# Patient Record
Sex: Female | Born: 1970
Health system: Southern US, Community
[De-identification: ages and names within clinical notes are randomized; demographics above are authoritative.]

## PROBLEM LIST (undated history)

## (undated) DIAGNOSIS — F329 Major depressive disorder, single episode, unspecified: Secondary | ICD-10-CM

## (undated) DIAGNOSIS — G114 Hereditary spastic paraplegia: Secondary | ICD-10-CM

## (undated) DIAGNOSIS — G43909 Migraine, unspecified, not intractable, without status migrainosus: Secondary | ICD-10-CM

## (undated) DIAGNOSIS — F32A Depression, unspecified: Secondary | ICD-10-CM

## (undated) DIAGNOSIS — K275 Chronic or unspecified peptic ulcer, site unspecified, with perforation: Secondary | ICD-10-CM

## (undated) DIAGNOSIS — K219 Gastro-esophageal reflux disease without esophagitis: Secondary | ICD-10-CM

## (undated) DIAGNOSIS — F419 Anxiety disorder, unspecified: Secondary | ICD-10-CM

## (undated) DIAGNOSIS — G8929 Other chronic pain: Secondary | ICD-10-CM

## (undated) HISTORY — PX: HERNIA REPAIR: SHX51

## (undated) HISTORY — DX: Migraine, unspecified, not intractable, without status migrainosus: G43.909

## (undated) HISTORY — PX: CHOLECYSTECTOMY: SHX55

## (undated) HISTORY — DX: Gastro-esophageal reflux disease without esophagitis: K21.9

---

## 1998-08-14 HISTORY — PX: ENDOMETRIAL ABLATION: SHX621

## 2002-01-24 ENCOUNTER — Emergency Department (HOSPITAL_COMMUNITY): Admission: EM | Admit: 2002-01-24 | Discharge: 2002-01-24 | Payer: Self-pay | Admitting: Emergency Medicine

## 2004-12-09 ENCOUNTER — Emergency Department (HOSPITAL_COMMUNITY): Admission: EM | Admit: 2004-12-09 | Discharge: 2004-12-09 | Payer: Self-pay | Admitting: Emergency Medicine

## 2005-04-16 ENCOUNTER — Emergency Department (HOSPITAL_COMMUNITY): Admission: EM | Admit: 2005-04-16 | Discharge: 2005-04-16 | Payer: Self-pay | Admitting: Emergency Medicine

## 2005-05-08 ENCOUNTER — Ambulatory Visit: Payer: Self-pay | Admitting: Orthopedic Surgery

## 2006-03-13 ENCOUNTER — Ambulatory Visit: Payer: Self-pay | Admitting: Internal Medicine

## 2006-03-21 ENCOUNTER — Encounter (INDEPENDENT_AMBULATORY_CARE_PROVIDER_SITE_OTHER): Payer: Self-pay | Admitting: *Deleted

## 2006-03-21 ENCOUNTER — Ambulatory Visit (HOSPITAL_COMMUNITY): Admission: RE | Admit: 2006-03-21 | Discharge: 2006-03-21 | Payer: Self-pay | Admitting: Internal Medicine

## 2008-01-20 ENCOUNTER — Emergency Department (HOSPITAL_COMMUNITY): Admission: EM | Admit: 2008-01-20 | Discharge: 2008-01-20 | Payer: Self-pay | Admitting: Emergency Medicine

## 2008-03-06 DIAGNOSIS — K219 Gastro-esophageal reflux disease without esophagitis: Secondary | ICD-10-CM | POA: Insufficient documentation

## 2008-03-06 DIAGNOSIS — F411 Generalized anxiety disorder: Secondary | ICD-10-CM | POA: Insufficient documentation

## 2008-03-10 ENCOUNTER — Ambulatory Visit: Payer: Self-pay | Admitting: Internal Medicine

## 2008-03-10 DIAGNOSIS — R1031 Right lower quadrant pain: Secondary | ICD-10-CM | POA: Insufficient documentation

## 2008-03-20 ENCOUNTER — Telehealth: Payer: Self-pay | Admitting: Internal Medicine

## 2008-03-24 ENCOUNTER — Telehealth: Payer: Self-pay | Admitting: Internal Medicine

## 2008-05-22 ENCOUNTER — Emergency Department (HOSPITAL_COMMUNITY): Admission: EM | Admit: 2008-05-22 | Discharge: 2008-05-22 | Payer: Self-pay | Admitting: Emergency Medicine

## 2008-08-10 ENCOUNTER — Encounter
Admission: RE | Admit: 2008-08-10 | Discharge: 2008-08-13 | Payer: Self-pay | Admitting: Physical Medicine & Rehabilitation

## 2008-08-11 ENCOUNTER — Ambulatory Visit: Payer: Self-pay | Admitting: Physical Medicine & Rehabilitation

## 2008-08-19 ENCOUNTER — Encounter
Admission: RE | Admit: 2008-08-19 | Discharge: 2008-11-17 | Payer: Self-pay | Admitting: Physical Medicine & Rehabilitation

## 2008-09-10 ENCOUNTER — Encounter
Admission: RE | Admit: 2008-09-10 | Discharge: 2008-12-09 | Payer: Self-pay | Admitting: Physical Medicine & Rehabilitation

## 2008-09-10 ENCOUNTER — Ambulatory Visit: Payer: Self-pay | Admitting: Physical Medicine & Rehabilitation

## 2008-12-10 ENCOUNTER — Encounter
Admission: RE | Admit: 2008-12-10 | Discharge: 2008-12-15 | Payer: Self-pay | Admitting: Physical Medicine & Rehabilitation

## 2008-12-15 ENCOUNTER — Ambulatory Visit: Payer: Self-pay | Admitting: Physical Medicine & Rehabilitation

## 2009-05-17 ENCOUNTER — Encounter
Admission: RE | Admit: 2009-05-17 | Discharge: 2009-05-18 | Payer: Self-pay | Admitting: Physical Medicine & Rehabilitation

## 2009-05-18 ENCOUNTER — Ambulatory Visit: Payer: Self-pay | Admitting: Physical Medicine & Rehabilitation

## 2009-09-16 ENCOUNTER — Encounter
Admission: RE | Admit: 2009-09-16 | Discharge: 2009-09-16 | Payer: Self-pay | Admitting: Physical Medicine & Rehabilitation

## 2009-10-15 ENCOUNTER — Encounter (INDEPENDENT_AMBULATORY_CARE_PROVIDER_SITE_OTHER): Payer: Self-pay | Admitting: *Deleted

## 2009-10-15 ENCOUNTER — Emergency Department (HOSPITAL_COMMUNITY): Admission: EM | Admit: 2009-10-15 | Discharge: 2009-10-15 | Payer: Self-pay | Admitting: Emergency Medicine

## 2009-11-03 ENCOUNTER — Encounter (INDEPENDENT_AMBULATORY_CARE_PROVIDER_SITE_OTHER): Payer: Self-pay | Admitting: *Deleted

## 2009-11-03 ENCOUNTER — Emergency Department (HOSPITAL_COMMUNITY): Admission: EM | Admit: 2009-11-03 | Discharge: 2009-11-04 | Payer: Self-pay | Admitting: Emergency Medicine

## 2009-11-04 ENCOUNTER — Telehealth: Payer: Self-pay | Admitting: Internal Medicine

## 2009-11-11 ENCOUNTER — Ambulatory Visit: Payer: Self-pay | Admitting: Internal Medicine

## 2009-11-11 DIAGNOSIS — F17200 Nicotine dependence, unspecified, uncomplicated: Secondary | ICD-10-CM | POA: Insufficient documentation

## 2009-11-11 DIAGNOSIS — R197 Diarrhea, unspecified: Secondary | ICD-10-CM | POA: Insufficient documentation

## 2009-11-11 DIAGNOSIS — R933 Abnormal findings on diagnostic imaging of other parts of digestive tract: Secondary | ICD-10-CM | POA: Insufficient documentation

## 2009-11-11 DIAGNOSIS — R109 Unspecified abdominal pain: Secondary | ICD-10-CM | POA: Insufficient documentation

## 2009-12-08 ENCOUNTER — Ambulatory Visit: Payer: Self-pay | Admitting: Internal Medicine

## 2010-01-31 ENCOUNTER — Encounter
Admission: RE | Admit: 2010-01-31 | Discharge: 2010-05-01 | Payer: Self-pay | Admitting: Physical Medicine & Rehabilitation

## 2010-02-04 ENCOUNTER — Ambulatory Visit: Payer: Self-pay | Admitting: Physical Medicine & Rehabilitation

## 2010-03-12 ENCOUNTER — Emergency Department (HOSPITAL_COMMUNITY): Admission: EM | Admit: 2010-03-12 | Discharge: 2010-03-13 | Payer: Self-pay | Admitting: Emergency Medicine

## 2010-03-18 ENCOUNTER — Telehealth: Payer: Self-pay | Admitting: Internal Medicine

## 2010-04-01 ENCOUNTER — Ambulatory Visit: Payer: Self-pay | Admitting: Physical Medicine & Rehabilitation

## 2010-05-17 ENCOUNTER — Ambulatory Visit: Payer: Self-pay | Admitting: Internal Medicine

## 2010-07-27 ENCOUNTER — Encounter
Admission: RE | Admit: 2010-07-27 | Discharge: 2010-08-02 | Payer: Self-pay | Source: Home / Self Care | Attending: Physical Medicine & Rehabilitation | Admitting: Physical Medicine & Rehabilitation

## 2010-08-02 ENCOUNTER — Ambulatory Visit: Payer: Self-pay | Admitting: Physical Medicine & Rehabilitation

## 2010-08-31 ENCOUNTER — Encounter
Admission: RE | Admit: 2010-08-31 | Discharge: 2010-09-13 | Payer: Self-pay | Source: Home / Self Care | Attending: Physical Medicine & Rehabilitation | Admitting: Physical Medicine & Rehabilitation

## 2010-09-04 ENCOUNTER — Encounter: Payer: Self-pay | Admitting: Internal Medicine

## 2010-09-06 ENCOUNTER — Ambulatory Visit: Admit: 2010-09-06 | Payer: Self-pay | Admitting: Physical Medicine & Rehabilitation

## 2010-09-13 NOTE — Assessment & Plan Note (Signed)
Summary: f/uabd. pain seen in erfor abd. pain   History of Present Illness Visit Type: Follow-up Visit Primary GI MD: Yancey Flemings MD Primary Provider: Claire Shown Requesting Provider: Riki Sheer Chief Complaint: follow up for abdominal pain , pt still c/o abdominal pain, also rectal bleeding History of Present Illness:   Patient was seen in 2007 for GERD and dysphagia. She was seen again 2009 for RLQ / flank pain which was felt to be musculoskeletal or urologic in nature.  She is here for post-ER follow up. Two weeks ago went to [facility] ER for nausea, vomiting, diarrhea (containing small amount of blood) and lower abdominal pain. The nausea and vomiting had actually been going on for two weeks prior to ER visit.  CBC, LFTs, lipase were unremarkable. CTscan showed mild colitis. Given Augmentin twice daily for 10 days, told had stomach virus. Back to ER on 11/04/09 with persistent abdominal pain, documented as epigastric in ER. Told by ER to follow up here. BMs are normalizing but she complains of diffuse abdominal pain extending all the way out to both flanks. Pain most pronounced in periumbilical area, it is worse after meals. Describes this pain as chronic but much worse lately.  She has heartburn and epigastric pain. Omeprazole isn't working well. No dysphagia. Nexium worked better.   No major weight loss, down 5 pounds since July 2009.    GI Review of Systems    Reports abdominal pain and  acid reflux.     Location of  Abdominal pain: diffuse.    Denies belching, bloating, chest pain, dysphagia with liquids, dysphagia with solids, heartburn, loss of appetite, nausea, vomiting, vomiting blood, weight loss, and  weight gain.        Denies anal fissure, black tarry stools, change in bowel habit, constipation, diarrhea, diverticulosis, fecal incontinence, heme positive stool, hemorrhoids, irritable bowel syndrome, jaundice, light color stool, liver problems, rectal bleeding, and  rectal  pain.    Current Medications (verified): 1)  Tizanidine Hcl 4 Mg  Tabs (Tizanidine Hcl) .... One By Mouth Every 6 Hours As Needed For Severe Spasms 2)  Omeprazole 40 Mg  Cpdr (Omeprazole) .... One Capsule By Mouth Daily 3)  Alprazolam 0.5 Mg  Tabs (Alprazolam) .... As Needed  Allergies (verified): 1)  ! Morphine  Past History:  Past Surgical History: Last updated: 03/10/2008 cholecystectomy tubal ligation 1 c-section  Family History: Last updated: 03/10/2008 No FH of Colon Cancer: father had ?cancer  Social History: Last updated: 03/10/2008 Patient currently smokes. 1 PPD Alcohol Use - yes  holidays Daily Caffeine Use  2 20oz sodas  Illicit Drug Use - no Patient does not get regular exercise.   Past Medical History: GERD (ICD-530.81) ANXIETY (ICD-300.00) Neurologic disorder involving the lower extremities  Review of Systems       The patient complains of arthritis/joint pain, back pain, and sleeping problems.  The patient denies allergy/sinus, anemia, anxiety-new, blood in urine, breast changes/lumps, change in vision, confusion, cough, coughing up blood, depression-new, fainting, fatigue, fever, headaches-new, hearing problems, heart murmur, heart rhythm changes, itching, menstrual pain, muscle pains/cramps, night sweats, nosebleeds, pregnancy symptoms, shortness of breath, skin rash, sore throat, swelling of feet/legs, swollen lymph glands, thirst - excessive , urination - excessive , urination changes/pain, urine leakage, vision changes, and voice change.    Vital Signs:  Patient profile:   40 year old female Height:      61 inches Weight:      136 pounds BMI:  25.79 BSA:     1.60 Pulse rate:   82 / minute Pulse rhythm:   regular BP sitting:   110 / 80  (left arm)  Vitals Entered By: Merri Ray CMA Duncan Dull) (November 11, 2009 8:25 AM)  Physical Exam  General:  Well developed, white female in wheelchair.  Eyes:  Conjunctiva pink, no icterus.  Mouth:   Poor dentition Neck:  no obvious masses  Lungs:  Clear throughout to auscultation. Heart:  Regular rate and rhythm; no murmurs, rubs,  or bruits. Abdomen:  Abdomen soft, nontender, nondistended. No obvious masses or hepatomegaly.Normal bowel sounds.  Msk:  Symmetrical with no gross deformities. Normal posture. Neurologic:  Alert and  oriented x4;  grossly normal neurologically. Skin:  Intact without significant lesions or rashes. Cervical Nodes:  No significant cervical adenopathy. Psych:  Alert and cooperative. Unable to weigh, pt states she is 89  Impression & Recommendations:  Problem # 1:  DIARRHEA OF PRESUMED INFECTIOUS ORIGIN (ICD-009.3) Assessment Improved Recent nausea, vomiting, diarrhea (containing small amount blood) and lower abdominal pain. CTscan revealed mild colitis (see report below).. ER labs revealed MCV 102.8, K+2.8, CMET and CBC otherwise normal. Lipase 15. Stool studies not done. Given acuteness, likely infectious etiology and now resolved after antibiotics  Problem # 2:  GERD (ICD-530.81) Assessment: Comment Only Symptomatic on Omeprazole 50mg  daily. Used to take Nexium which worked much better but she had difficulty paying for it. Several samples of Nexium given. Stop Omeprazole. Thinks anxiety exacerbating GERD and chronic abdominal pain. .Follow up in a few weeks with Dr. [name].   Problem # 3:  ANXIETY (ICD-300.00) Assessment: Deteriorated On Xanax. She asks for something stronger. Told her that anxiety could be exacerbating some of her GI issues but that we don't routinely treat anxiety. She was given number to behavioral health.   Problem # 4:  PARAPLEGIA (ICD-344.1) Assessment: Comment Only Wheelchair bound. Patient has a neurological disorder which causes spasticity of her lower body.   Problem # 5:  TOBACCO ABUSE (ICD-305.1) Assessment: Comment Only  Patient Instructions: 1)  We have given you samples of Nexium. Take 1 capsule twice daily. 2)  Take  1 30 min prior to breakfast and dinner. 3)  Made you an appointment with Dr [name] on 11-30-09 at 10:15Am. 4)  Call Behavioral Health at [phone] and make an appointment to be seen. 5)  Copy sent to : [name], MD 6)                         Rudi Heap, MD 7)  The medication list was reviewed and reconciled.  All changed / newly prescribed medications were explained.  A complete medication list was provided to the patient / caregiver.   CT Abdomen/Pelvis  Procedure date:  10/15/2009  Findings:      Clinical Data: Abdominal and pelvic pain with nausea.    CT ABDOMEN AND PELVIS WITH CONTRAST    Technique:  Multidetector CT imaging of the abdomen and pelvis was   performed following the standard protocol during bolus   administration of intravenous contrast.    Contrast: 100 ml intravenous Omnipaque-300    Comparison: 01/20/2008    Findings: Mild dependent and bibasilar atelectasis is identified.    The liver, adrenal glands, kidneys, spleen, and pancreas are   unremarkable.   Cholecystectomy noted.    Mild wall thickening of the ascending, transverse, and descending   colon are identified compatible with a mild colitis.  There is no evidence of pneumoperitoneum, focal abscess, or bowel   obstruction.   A small amount of free pelvic fluid is identified.   The bladder is within normal limits.    Small hypodense lesions within the uterus likely represent   fibroids.    No acute or suspicious bony abnormalities are identified.    IMPRESSION:   Mild colitis - likely inflammatory or infectious.    Small amount of free pelvic fluid - question related to colitis or   physiologic.    Read By:  Rosendo Gros,  M.D.   Released By:  Laruth Bouchard.D.

## 2010-09-13 NOTE — Progress Notes (Signed)
Summary: triage  Phone Note Call from Patient Call back at Home Phone [phone]   Caller: Patient Call For: [name] Reason for Call: Talk to Nurse Summary of Call: Patient states that she has severe stomach pain and went to the ER and was told to see Dr [name] asap but his firs available is not until the end of April. Initial call taken by: Tawni Levy,  November 04, 2009 1:09 PM  Follow-up for Phone Call        Given appt. for Monday with NP as Dr.Azelie Noguera is supervising dr. and she needss time to arrange transportation.  Follow-up by: Teryl Lucy RN,  November 04, 2009 2:00 PM

## 2010-09-13 NOTE — Progress Notes (Signed)
Summary: Does she need appt?  Phone Note Call from Patient Call back at Home Phone [phone]   Call For: Dr [name] Reason for Call: Talk to Nurse Summary of Call: Was released from hospital recently and is unsure if she needs to schedule a follow up appt.  Initial call taken by: Leanor Kail [facility],  March 18, 2010 2:18 PM  Follow-up for Phone Call        Jeani Hawking discharged orders states pt needs appt with GI for f/u.  Appt made for 03/29/10 with Dr [name]  Follow-up by: Chales Abrahams CMA Duncan Dull),  March 18, 2010 2:53 PM

## 2010-09-13 NOTE — Assessment & Plan Note (Signed)
Summary: Followup abdominal pain (saw Gunnar Fusi 3-31)   History of Present Illness Visit Type: Follow-up Visit Primary GI MD: Yancey Flemings MD Primary Provider: Gennette Pac at Dr. Kathi Der office  Requesting Provider: n/a Chief Complaint: F/u for abd pain. Pt states abd pain is getting better  History of Present Illness:   40 year old female with unspecified neurologic disorder involving the lower extremities for which she uses a walker and wheelchair, history of anxiety disorder with panic disorder, status post cholecystectomy, GERD, and history of non-GI right flank pain. She was evaluated in this office 4 weeks ago after an emergency room visit for nausea, vomiting, and diarrhea. See that dictation for details. She was felt to have an acute gastroenteritis. Mild colitis on CT. As well worsening GERD. She was treated symptomatically and her PPI increase her she presents today for followup as requested. She reports resolution of her acute abdominal pain and diarrhea. She now has 2 formed bowel movements daily. No bleeding. Reflux symptoms have improved on Nexium. She cannot afford the medication and requested we contacted her pharmacy regarding a medication override. No new complaints. She continues to smoke.   GI Review of Systems    Reports abdominal pain.     Location of  Abdominal pain: generalized.    Denies acid reflux, belching, bloating, chest pain, dysphagia with liquids, dysphagia with solids, heartburn, loss of appetite, nausea, vomiting, vomiting blood, weight loss, and  weight gain.        Denies anal fissure, black tarry stools, change in bowel habit, constipation, diarrhea, diverticulosis, fecal incontinence, heme positive stool, hemorrhoids, irritable bowel syndrome, jaundice, light color stool, liver problems, rectal bleeding, and  rectal pain.    Current Medications (verified): 1)  Tizanidine Hcl 4 Mg  Tabs (Tizanidine Hcl) .... One By Mouth Every 6 Hours As Needed For Severe  Spasms 2)  Omeprazole 40 Mg  Cpdr (Omeprazole) .... One Capsule By Mouth Daily 3)  Alprazolam 0.5 Mg  Tabs (Alprazolam) .... As Needed 4)  Nexium 40 Mg Cpdr (Esomeprazole Magnesium) .... Take 1 Capsule 30 Min Prior To Breakfast and Dinner 5)  Baclofen 10 Mg Tabs (Baclofen) .... One or Two Tablets By Mouth Daily 6)  Zestril(Dosage Unknown) .... As Needed For Headaches  Allergies (verified): 1)  ! Morphine  Past History:  Past Medical History: Reviewed history from 11/11/2009 and no changes required. GERD (ICD-530.81) ANXIETY (ICD-300.00) Neurologic disorder involving the lower extremities  Past Surgical History: Reviewed history from 03/10/2008 and no changes required. cholecystectomy tubal ligation 1 c-section  Family History: Reviewed history from 03/10/2008 and no changes required. No FH of Colon Cancer: father had ?cancer  Social History: Reviewed history from 03/10/2008 and no changes required. Patient currently smokes. 1 PPD Alcohol Use - yes  holidays Daily Caffeine Use  2 20oz sodas  Illicit Drug Use - no Patient does not get regular exercise.   Review of Systems  The patient denies allergy/sinus, anemia, anxiety-new, arthritis/joint pain, back pain, blood in urine, breast changes/lumps, change in vision, confusion, cough, coughing up blood, depression-new, fainting, fatigue, fever, headaches-new, hearing problems, heart murmur, heart rhythm changes, itching, menstrual pain, muscle pains/cramps, night sweats, nosebleeds, pregnancy symptoms, shortness of breath, skin rash, sleeping problems, sore throat, swelling of feet/legs, swollen lymph glands, thirst - excessive , urination - excessive , urination changes/pain, urine leakage, vision changes, and voice change.    Vital Signs:  Patient profile:   40 year old female Pulse rate:   88 / minute Pulse rhythm:  regular BP sitting:   110 / 64  (left arm) Cuff size:   regular  Vitals Entered By: Ok Anis CMA  (December 08, 2009 10:46 AM)  Physical Exam  General:  Well developed, well nourished, no acute distress.. Sitting in wheelchair Head:  Normocephalic and atraumatic. Eyes:  PERRLA, no icterus. Mouth:  No deformity or lesions,  Lungs:  Clear throughout to auscultation. Heart:  Regular rate and rhythm; no murmurs, rubs,  or bruits. Abdomen:  Soft, nontender and nondistended. No masses, hepatosplenomegaly or hernias noted. Normal bowel sounds. Pulses:  Normal pulses noted. Extremities:  no edema Neurologic:  Alert and  oriented x4;   Skin:  Intact without significant lesions or rashes. Psych:  Alert and cooperative. Normal mood and affect.   Impression & Recommendations:  Problem # 1:  DIARRHEA OF PRESUMED INFECTIOUS ORIGIN (ICD-009.3) acute gastroenteritis. Resolved.  .Plan: #1. Return to the care of Dr. Kathi Der office  Problem # 2:  GERD (ICD-530.81) seemingly better with Nexium added to omeprazole  Plan: #1. Continue b.i.d. PPI. Add Nexium along with omeprazole and she feels this works better than b.i.d. omeprazole #2. Reflux precautions with attention to weight loss #3. GI followup p.r.n.  Patient Instructions: 1)  Nexium 40 mg #180 sent to pharmacy with 3 RFs.   2)  The medication list was reviewed and reconciled.  All changed / newly prescribed medications were explained.  A complete medication list was provided to the patient / caregiver. 3)  printed and given to patient. Milford Cage [facility]  December 08, 2009 11:30 AM 4)  copy: Dr. Rudi Heap Prescriptions: NEXIUM 40 MG CPDR (ESOMEPRAZOLE MAGNESIUM) Take 1 capsule 30 min prior to breakfast and dinner  #180 x 3   Entered by:   Milford Cage NCMA   Authorized by:   Hilarie Fredrickson MD   Signed by:   Milford Cage NCMA on 12/08/2009   Method used:   Electronically to        [facility]. [phone]* (retail)       [address], [place]  [address]       Ph: [phone]       Fax: [phone]   RxID:    [id number]

## 2010-09-13 NOTE — Assessment & Plan Note (Signed)
Summary: GERD with nausea and vomiting   History of Present Illness Primary GI MD: Yancey Flemings MD Primary Provider: Gennette Pac at Dr. Kathi Der office  Requesting Provider: n/a Chief Complaint: Increased intermittant generalized abd pain with N/V and was recently seen in the ER at [facility]. Pt states she does have a appetite and she eats regularly but has N/V afterwards. Pt states it has only increased in the last 3 months. History of Present Illness:   40 year old female with unspecified neurologic disorder involving the lower extremities for which she uses a walker and wheelchair. Also history of chronic anxiety with panic disorder, GERD, prior cholecystectomy, and 9 GI right flank pain. She was last seen in April of 2011 for followup of abdominal pain. She presents now with intermittent complaints of nausea and vomiting which she has had for years. She takes PPI therapy and varying dosages. She currently feels that she does best with Nexium 40 mg b.i.d. and omeprazole p.r.n. breakthrough. She continues to smoke. She accompanied by her daughter. Some transient problems with constipation which have resolved. No weight loss. No bleeding. No dysphagia. Prior esophagram revealed benign stricture of the distal esophagus. Otherwise negative. She was evaluated in the emergency room July 31 for nausea and vomiting. Essentially normal CBC, copper has a metabolic panel, and urinalysis. She has been doing better recently.. She complains about the cost of her medications, as she is done previously, and request samples.   GI Review of Systems    Reports abdominal pain, acid reflux, belching, nausea, and  vomiting.      Denies bloating, chest pain, dysphagia with liquids, dysphagia with solids, heartburn, loss of appetite, vomiting blood, weight loss, and  weight gain.        Denies anal fissure, black tarry stools, change in bowel habit, constipation, diarrhea, diverticulosis, fecal incontinence,  heme positive stool, hemorrhoids, irritable bowel syndrome, jaundice, light color stool, liver problems, rectal bleeding, and  rectal pain.    Current Medications (verified): 1)  Tizanidine Hcl 4 Mg  Tabs (Tizanidine Hcl) .... One By Mouth Every 6 Hours As Needed For Severe Spasms 2)  Omeprazole 40 Mg  Cpdr (Omeprazole) .... One Capsule By Mouth Daily 3)  Alprazolam 2 Mg Tabs (Alprazolam) .... One Tablet By Mouth Every 12 Hours As Needed 4)  Nexium 40 Mg Cpdr (Esomeprazole Magnesium) .... Take 1 Capsule 30 Min Prior To Breakfast and Dinner 5)  Baclofen 10 Mg Tabs (Baclofen) .... One or Two Tablets By Mouth Daily 6)  Zestril(Dosage Unknown) .... As Needed For Headaches  Allergies (verified): 1)  ! Morphine  Past History:  Past Medical History: Reviewed history from 11/11/2009 and no changes required. GERD (ICD-530.81) ANXIETY (ICD-300.00) Neurologic disorder involving the lower extremities  Past Surgical History: Reviewed history from 03/10/2008 and no changes required. cholecystectomy tubal ligation 1 c-section  Family History: Reviewed history from 03/10/2008 and no changes required. No FH of Colon Cancer: father had ?cancer  Social History: Reviewed history from 03/10/2008 and no changes required. Patient currently smokes. 1 PPD Alcohol Use - yes  holidays Daily Caffeine Use  2 20oz sodas  Illicit Drug Use - no Patient does not get regular exercise.   Review of Systems  The patient denies allergy/sinus, anemia, anxiety-new, arthritis/joint pain, back pain, blood in urine, breast changes/lumps, change in vision, confusion, cough, coughing up blood, depression-new, fainting, fatigue, fever, headaches-new, hearing problems, heart murmur, heart rhythm changes, itching, menstrual pain, muscle pains/cramps, night sweats, nosebleeds, pregnancy symptoms, shortness  of breath, skin rash, sleeping problems, sore throat, swelling of feet/legs, swollen lymph glands, thirst - excessive  , urination - excessive , urination changes/pain, urine leakage, vision changes, and voice change.    Vital Signs:  Patient profile:   40 year old female Height:      61 inches Pulse rate:   90 / minute Pulse rhythm:   regular BP sitting:   116 / 88  (right arm) Cuff size:   regular  Vitals Entered By: Christie Nottingham CMA Duncan Dull) (May 17, 2010 3:34 PM)  Physical Exam  General:  well-nourished, well-developed, somewhat disheveled-appearing female in a wheelchair Head:  Normocephalic and atraumatic. Eyes:  anicteric Mouth:  No deformity or lesions,. No thrush Neck:  Supple; no masses or thyromegaly. Lungs:  Clear throughout to auscultation. Heart:  Regular rate and rhythm; no murmurs, rubs,  or bruits. Abdomen:  Soft, nontender and nondistended. No masses, hepatosplenomegaly or hernias noted. Normal bowel sounds. Msk:  no deformities Pulses:  Normal pulses noted. Extremities:  no edema Neurologic:  alert and oriented Skin:  Intact without significant lesions or rashes. Psych:  Alert and cooperative. Normal mood and affect.   Impression & Recommendations:  Problem # 1:  GERD (ICD-530.81) GERD. Probable cause for intermittent nausea with vomiting. Probably exacerbated by wheelchair-bound state.  Plan:  #1. Strict attention to reflux precautions with particular attention to the size of meal, timing of meal, and discontinuation of smoking #2. Continue Nexium 40 mg b.i.d. A prescription electronically submitted. Samples numbering 30 given #3. Followup p.r.n.  Patient Instructions: 1)  Nexium samples given to patient and Rx. sent to pharmacy #180 x 3  RFs. 2)  GI Reflux brochure given.  3)  GERD Prevention information given. 4)  Copy sent to : Gennette Pac at Dr. Kathi Der office  5)  The medication list was reviewed and reconciled.  All changed / newly prescribed medications were explained.  A complete medication list was provided to the patient /  caregiver.  Prescriptions: NEXIUM 40 MG CPDR (ESOMEPRAZOLE MAGNESIUM) Take 1 capsule 30 min prior to breakfast and dinner  #180 x 3   Entered by:   Milford Cage NCMA   Authorized by:   Hilarie Fredrickson MD   Signed by:   Milford Cage NCMA on 05/17/2010   Method used:   Electronically to        [facility]. [phone]* (retail)       [address] [place], [place]  [address]       Ph: [phone]       Fax: [phone]   RxID:   [id number]

## 2010-10-07 ENCOUNTER — Encounter: Payer: Medicare Other | Attending: Physical Medicine & Rehabilitation

## 2010-10-07 ENCOUNTER — Ambulatory Visit: Payer: Medicare Other | Admitting: Physical Medicine & Rehabilitation

## 2010-10-07 DIAGNOSIS — R209 Unspecified disturbances of skin sensation: Secondary | ICD-10-CM

## 2010-10-07 DIAGNOSIS — G8220 Paraplegia, unspecified: Secondary | ICD-10-CM | POA: Insufficient documentation

## 2010-10-07 DIAGNOSIS — Z993 Dependence on wheelchair: Secondary | ICD-10-CM | POA: Insufficient documentation

## 2010-10-29 LAB — COMPREHENSIVE METABOLIC PANEL WITH GFR
AST: 11 U/L (ref 0–37)
BUN: 15 mg/dL (ref 6–23)
Calcium: 8.2 mg/dL — ABNORMAL LOW (ref 8.4–10.5)
Potassium: 3.2 meq/L — ABNORMAL LOW (ref 3.5–5.1)
Sodium: 137 meq/L (ref 135–145)
Total Protein: 6.1 g/dL (ref 6.0–8.3)

## 2010-10-29 LAB — DIFFERENTIAL
Basophils Absolute: 0 K/uL (ref 0.0–0.1)
Basophils Relative: 0 % (ref 0–1)
Eosinophils Absolute: 0.1 10*3/uL (ref 0.0–0.7)
Eosinophils Relative: 1 % (ref 0–5)
Lymphocytes Relative: 39 % (ref 12–46)
Lymphs Abs: 2.5 10*3/uL (ref 0.7–4.0)
Monocytes Absolute: 0.3 K/uL (ref 0.1–1.0)
Monocytes Relative: 5 % (ref 3–12)
Neutro Abs: 3.6 K/uL (ref 1.7–7.7)
Neutrophils Relative %: 55 % (ref 43–77)

## 2010-10-29 LAB — CBC
HCT: 35.4 % — ABNORMAL LOW (ref 36.0–46.0)
Hemoglobin: 12.2 g/dL (ref 12.0–15.0)
MCH: 35.3 pg — ABNORMAL HIGH (ref 26.0–34.0)
MCHC: 34.5 g/dL (ref 30.0–36.0)
MCV: 102.5 fL — ABNORMAL HIGH (ref 78.0–100.0)
Platelets: 246 K/uL (ref 150–400)
RBC: 3.46 MIL/uL — ABNORMAL LOW (ref 3.87–5.11)
RDW: 12.8 % (ref 11.5–15.5)
WBC: 6.4 10*3/uL (ref 4.0–10.5)

## 2010-10-29 LAB — URINALYSIS, ROUTINE W REFLEX MICROSCOPIC
Bilirubin Urine: NEGATIVE
Glucose, UA: NEGATIVE mg/dL
Hgb urine dipstick: NEGATIVE
Ketones, ur: 15 mg/dL — AB
Nitrite: NEGATIVE
Protein, ur: NEGATIVE mg/dL
Specific Gravity, Urine: >1.03 — ABNORMAL HIGH (ref 1.005–1.030)
Urobilinogen, UA: 0.2 mg/dL (ref 0.0–1.0)
pH: 5.5 (ref 5.0–8.0)

## 2010-10-29 LAB — COMPREHENSIVE METABOLIC PANEL
ALT: 9 U/L (ref 0–35)
Albumin: 3.5 g/dL (ref 3.5–5.2)
Alkaline Phosphatase: 37 U/L — ABNORMAL LOW (ref 39–117)
CO2: 20 mEq/L (ref 19–32)
Chloride: 111 mEq/L (ref 96–112)
Creatinine, Ser: 0.52 mg/dL (ref 0.4–1.2)
GFR calc Af Amer: >60 mL/min (ref >60)
GFR calc non Af Amer: >60 mL/min (ref >60)
Glucose, Bld: 85 mg/dL (ref 70–99)
Total Bilirubin: 0.3 mg/dL (ref 0.3–1.2)

## 2010-10-29 LAB — LIPASE, BLOOD: Lipase: 22 U/L (ref 11–59)

## 2010-10-29 LAB — PREGNANCY, URINE: Preg Test, Ur: NEGATIVE

## 2010-11-06 LAB — URINALYSIS, ROUTINE W REFLEX MICROSCOPIC
Glucose, UA: NEGATIVE mg/dL
Glucose, UA: NEGATIVE mg/dL
Ketones, ur: 15 mg/dL — AB
Leukocytes, UA: NEGATIVE
Nitrite: NEGATIVE
Nitrite: NEGATIVE
Protein, ur: 30 mg/dL — AB
Protein, ur: NEGATIVE mg/dL
Specific Gravity, Urine: >1.03 — ABNORMAL HIGH (ref 1.005–1.030)
Specific Gravity, Urine: >1.03 — ABNORMAL HIGH (ref 1.005–1.030)
Urobilinogen, UA: 0.2 mg/dL (ref 0.0–1.0)
pH: 5.5 (ref 5.0–8.0)
pH: 5.5 (ref 5.0–8.0)

## 2010-11-06 LAB — COMPREHENSIVE METABOLIC PANEL
AST: 13 U/L (ref 0–37)
Albumin: 3.5 g/dL (ref 3.5–5.2)
Alkaline Phosphatase: 60 U/L (ref 39–117)
Calcium: 8.5 mg/dL (ref 8.4–10.5)
Creatinine, Ser: 0.55 mg/dL (ref 0.4–1.2)
GFR calc non Af Amer: >60 mL/min (ref >60)
Glucose, Bld: 89 mg/dL (ref 70–99)
Potassium: 2.9 mEq/L — ABNORMAL LOW (ref 3.5–5.1)
Sodium: 137 mEq/L (ref 135–145)
Total Protein: 6.8 g/dL (ref 6.0–8.3)

## 2010-11-06 LAB — COMPREHENSIVE METABOLIC PANEL WITH GFR
ALT: 10 U/L (ref 0–35)
BUN: 14 mg/dL (ref 6–23)
CO2: 23 meq/L (ref 19–32)
Chloride: 105 meq/L (ref 96–112)
GFR calc Af Amer: >60 mL/min (ref >60)
Total Bilirubin: 0.6 mg/dL (ref 0.3–1.2)

## 2010-11-06 LAB — CBC
Hemoglobin: 14.9 g/dL (ref 12.0–15.0)
Platelets: 211 10*3/uL (ref 150–400)
RBC: 4.22 MIL/uL (ref 3.87–5.11)
RDW: 12.1 % (ref 11.5–15.5)
WBC: 10.1 10*3/uL (ref 4.0–10.5)

## 2010-11-06 LAB — DIFFERENTIAL
Eosinophils Relative: 0 % (ref 0–5)
Lymphocytes Relative: 7 % — ABNORMAL LOW (ref 12–46)
Lymphs Abs: 0.7 10*3/uL (ref 0.7–4.0)
Monocytes Relative: 3 % (ref 3–12)
Neutro Abs: 9.2 10*3/uL — ABNORMAL HIGH (ref 1.7–7.7)
Neutrophils Relative %: 91 % — ABNORMAL HIGH (ref 43–77)

## 2010-11-06 LAB — URINE MICROSCOPIC-ADD ON

## 2010-11-06 LAB — LIPASE, BLOOD: Lipase: 15 U/L (ref 11–59)

## 2010-11-29 ENCOUNTER — Ambulatory Visit: Payer: Medicare Other | Admitting: Physical Medicine & Rehabilitation

## 2010-11-29 ENCOUNTER — Encounter: Payer: Medicare Other | Attending: Physical Medicine & Rehabilitation

## 2010-11-29 DIAGNOSIS — R209 Unspecified disturbances of skin sensation: Secondary | ICD-10-CM | POA: Insufficient documentation

## 2010-11-29 DIAGNOSIS — G8220 Paraplegia, unspecified: Secondary | ICD-10-CM | POA: Insufficient documentation

## 2010-11-29 DIAGNOSIS — Z993 Dependence on wheelchair: Secondary | ICD-10-CM | POA: Insufficient documentation

## 2010-12-19 ENCOUNTER — Ambulatory Visit: Payer: Medicare Other | Admitting: Physical Medicine & Rehabilitation

## 2010-12-19 ENCOUNTER — Encounter: Payer: Medicare Other | Attending: Physical Medicine & Rehabilitation

## 2010-12-19 DIAGNOSIS — R209 Unspecified disturbances of skin sensation: Secondary | ICD-10-CM | POA: Insufficient documentation

## 2010-12-19 DIAGNOSIS — G8220 Paraplegia, unspecified: Secondary | ICD-10-CM | POA: Insufficient documentation

## 2010-12-19 DIAGNOSIS — G114 Hereditary spastic paraplegia: Secondary | ICD-10-CM

## 2010-12-19 DIAGNOSIS — Z993 Dependence on wheelchair: Secondary | ICD-10-CM | POA: Insufficient documentation

## 2010-12-20 NOTE — Assessment & Plan Note (Signed)
REASON FOR VISIT:  Spasms in the leg and back.  A 40 year old female with history of spastic paraplegia autosomal dominant inheritance pattern.  Last seen by me August 02, 2010.  She is well controlled on her current medication regimen.  Her main complaint is has to do with her migraines and the plans [facility] with [facility].  She is using Voltaren gel for her shoulder blade area pain, is somewhat on her thoracic area pain.  She uses about 4 times a day.  She is on baclofen 10 mg b.i.d. and tizanidine 2 mg t.i.d., this combination is not causing any sedation and has been controlling her spasms.  REVIEW OF SYSTEMS:  Positive for dizziness and migraine headaches.  Pain score is 5/10 on the numeric rating scale.  Interferes activity at 5.  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  PHYSICAL EXAMINATION:  VITAL SIGNS:  Blood pressure 140/98, pulse 74, respirations 18, O2 sat 100% on room air. GENERAL:  No acute distress.  Mood and affect are appropriate. MUSCULOSKELETAL:  Her upper extremity strength is 5/5 in deltoid biceps triceps grip.  Lower extremity strength is 3- in hip flexion, knee extension, ankle dorsiflexion.  Her range of motion is markedly reduced actively.  She has clonus bilateral ankles, 3+ reflexes in bilateral knees.  IMPRESSION: 1. Hereditary spastic paraplegia.  We will continue baclofen 10 mg     b.i.d. and tizanidine 2 mg t.i.d. 2. Carpal tunnel syndrome.  She has EMG negative, symptomatically     improved with wrist braces.  I will see her back in 4 months.     Erick Colace, M.D. Electronically Signed    AEK/MedQ D:  12/19/2010 12:30:19  T:  12/20/2010 00:08:27  Job #:  [id number]  cc:   Paulene Floor, M.D.

## 2010-12-27 NOTE — Consult Note (Signed)
CONSULT REQUESTED BY:  Jolaine Click, MD, at [facility].   Consult requested for the evaluation of mid back pain.   CHIEF COMPLAINT:  Mid back pain.   SECONDARY COMPLAINTS:  Abdominal pain and headache.   A 40 year old female with chief complaint of mid back pain of several  years' duration.  She states that she has some type of hereditary  condition that she inherited from her mother and has been in a  wheelchair the last 5-6 years.  I asked for additional records to be  sent from primary care office specifically requesting notes from  [facility].  She does have a history of hereditary  spastic paraparesis.  She has been on tizanidine 4 mg two p.o. t.i.d. as  per note from [facility], January 26, 2006.  She has been  on amitriptyline in the past, but does not like to be on this one.  She  has been treated for migraine headaches in the past as well by Dr.  Marcelino Freestone at [facility].  She has tried  Botox injection in her lower extremities, but I do not have the actual  notes to see where she was injected.   She is in a wheelchair basically all day, she can transfer by holding  onto steady objects to go on and off the toilet.  She can dress herself,  bathe herself.   She has had further workup including MRI of the thoracic spine, which  had some motion artifact, but was basically read as no overt osseous or  disk abnormality evident.  She has had normal lab testing including  complete metabolic package, May 27, 2008.  She has had MRI of the  lumbar spine, June 19, 2008, showing some mild facet arthrosis at L1-  L2 and L3-L4 as well as L4-L5 and moderate L5-S1.  She denies any  significant neck pain.   There is numbness, spasms, dizziness, anxiety on review of systems as  well as coughing and blood sugar regulation problems.   PAST MEDICAL HISTORY:  Significant for abdominal pains as well as  high  blood pressure.  She has had a couple of ER visit for abdominal pain in  the past year.  She indicates that her pain levels in the 6-8 range,  interferes with chore in the life at a moderate level.  Sleep is fair.  She is in a wheelchair really not ambulatory.  She has been disabled  since age 23 per her report, although I do not have any specific  documentation on this.   SOCIAL HISTORY:  Single, smokes a pack a day.  Denies drug or alcohol  abuse.   FAMILY HISTORY:  Heart disease, diabetes, disability.  She had a CT of  the pelvis and abdomen in June 2009, which was unremarkable.   PHYSICAL EXAMINATION:  VITAL SIGNS:  Blood pressure 149/97, pulse 75,  respiratory rate 18, O2 sat 99% on room air.  GENERAL:  No acute stress.  Orientation x3.  Affect alert.  Her posture  in the wheelchair is kyphotic.  She has a sling type wheelchair back,  but a solid cushion seat.   Her upper extremity strength is 5/5 in the deltoid, biceps, triceps  grip.  Her upper extremity deep tendon reflexes are 3+, bilateral  biceps, triceps brachii and radialis.  Her coordination is normal in the  upper extremities.  Deep tendon reflexes in lower extremity are 3+ with  clonus  at the ankles bilaterally.  She has Quantum varus deformities  with ankle contracture of bilateral lower extremities.  She does have 1+  ankle edema and foot edema bilaterally.  She has 3- quad strength  bilaterally, 2- hip flexor strength, and trace ankle dorsiflexor,  plantar flexor strength bilaterally.  Upper extremity strength is full.  Neck range of motion is full.  She has tenderness to palpation in the  mid back paraspinal area, but not along the spinous processes.  Her  sensory exam is normal.   IMPRESSION:  Thoracic pain appears to be muscular in nature most likely  related to her prolonged seated position in a poor back support chair.  She states that her current wheelchair is 56-23 years old.  While she has  a good  support of seat cushion in the seat back is not supportive and  per most kyphotic posture which further enhances her pain.   Spasticity.  Bilateral lower extremities persist, despite large doses of  tizanidine.  We will need to add some baclofen 10 mg b.i.d.  This may  help with her back pain.  I have also written for Voltaren gel to be  used along the back muscle area on a q.i.d. basis and then at nighttime  to apply Lidoderm patches at bedtime on q.a.m.   I will see her back after she undergoes wheelchair and sitting  evaluation at [facility].   Thank you for this interesting consultation.  I will keep you apprised  of her progress.  A course of physical therapy may be helpful to teach  with some back extensor strengthening exercises in a prone position.      Erick Colace, M.D.  Electronically Signed     AEK/MedQ  D:08/11/2008 16:57:50  T:08/12/2008 03:57:06  Job #:  [id number]   cc:   [facility]

## 2010-12-27 NOTE — Assessment & Plan Note (Signed)
A 40 year old female with mid back pain, history of spastic paraparesis.  She has been to [employer] since I last saw her on  September 10, 2008, has been measured, but no parts in for the wheelchair  yet.   New medications since last visit, zonisamide 100 mg nightly for  headaches.   HABITS:  Has quit caffeine and has reduced smoking as a part of her  headache control.   Oswestry score today was not completed.   Her pain in the mid-back area is 4/10.  Sleep is fair.  Relief from meds  is fair to good.   FUNCTIONAL STATUS:  A wheelchair-bound due to spastic paraparesis.   REVIEW OF SYSTEMS:  Positive for numbness and dizziness.  Numbness is in  the lower extremities.   SOCIAL HISTORY:  Lives with her daughter and boyfriend.   FAMILY HISTORY:  Heart disease and diabetes.   CURRENT MEDICATIONS:  1. Tizanidine 6 mg b.i.d.  2. Voltaren gel q.i.d. to the back.  3. Baclofen 10 mg b.i.d.  4. Lidoderm patch 2 patches to the back at night.   PHYSICAL EXAMINATION:  VITAL SIGNS:  Blood pressure is 127/85, pulse 69,  respiration 18, and O2 sat 99% on room air.  GENERAL:  No acute distress.  Orientation x3.  Affect is alert.  Gait is  with wheelchair.  EXTREMITY:  Lower extremity strength is 3-, but has a increased extensor  tone, therefore, formalized manual muscle testing not possible.   ASSESSMENT AND PLAN:  1. Spastic paraplegia.  She is relatively well controlled in terms of      her tone on the tizanidine and baclofen.  2. Thoracolumbar pain myofascial.  She really needs a new seat back      for a wheelchair.  She is waiting part, this has been going on for      a bit.  I asked her to call in followup on this.  3. Medications for her thoracolumbar pain is Lidoderm 2 patches on at      night and Voltaren gel to back q.i.d.   I will see her back in about 5 months.      Erick Colace, M.D.  Electronically Signed     AEK/MedQ  D:  12/15/2008  10:21:52  T:  12/15/2008 23:30:42  Job #:  [id number]   cc:   [facility]  [facility]   [facility] [address] Annia Belt

## 2010-12-30 NOTE — Assessment & Plan Note (Signed)
A 40 year old female with mid back pain.  She has a history of spastic  paraparesis, has been mainly wheelchair-bound 5-6 years.  I saw her in  initial consultation on August 11, 2008.   I sent her to wheelchair seating clinic to get a more supportive chair  back for her wheelchair.  She has been on tizanidine 6 mg t.i.d. and I  added baclofen 10 mg b.i.d.  I have written for Voltaren gel q.i.d. and  nighttime use of the Lidoderm patches.   The patient has had no other medical problems in the interval time.  She  is doing actually quite a bit better.   Her main complaint at this time is headaches.  She has migraines and has  been treated by neurology in the past.   Her Oswestry score today:  30%.   EXAMINATION:  She has clonus, bilateral ankles and knees.  DTRs are with  clonus 4+.  She has Ashworth grade 3 spasticity in the quads and only  1in the hamstring.  Her quad strength is 3- bilaterally, 2- hip flexor  strength, trace ankle dorsiflexor as well as plantar flexor strength.  Upper extremity strength is full.   IMPRESSION:  1. Thoracic pain likely myofascial.  2. Hereditary spastic paraplegia with improvement in spasticity as      well as pain.   PLAN:  We will go ahead and continue current medications:  Lidoderm  patch 2 patches on the back at night, Voltaren gel 4 times during the  day, baclofen 10 mg b.i.d. in addition to her tizanidine 6 mg t.i.d.  She will follow up with neurology in regards to headaches.      Erick Colace, M.D.  Electronically Signed     AEK/MedQ  D:  09/10/2008 13:22:15  T:  09/11/2008 02:32:45  Job #:  [id number]   cc:   Santina Evans A. Orlin Hilding, M.D.  Fax: [phone]

## 2010-12-30 NOTE — Assessment & Plan Note (Signed)
[facility]                           GASTROENTEROLOGY OFFICE NOTE   Tiffany Stewart, Tiffany Stewart                         MRN:          [id number]  DATE:03/13/2006                            DOB:          1971-04-01    OFFICE CONSULTATION NOTE:   REFERRING PHYSICIAN:  Ernestina Penna, MD   REASON FOR CONSULTATION:  Heartburn and dysphagia.   HISTORY:  This is a 40 year old white female with an unspecified neurologic  condition affecting her lower extremities, for which she has difficulty  walking.  She also has a history of generalized anxiety with panic disorder.  She is referred now through the courtesy of Dr. Kathi Stewart office after seeing  Tiffany Pascal, PA-C regarding reflux and dysphagia.  The patient reports  intermittent problems with dysphagia all her life.  Large pills and meats  are particularly problematic.  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  She denies having had endoscopy or any intervention.  She cannot specify further.  She has also had longstanding problems with  heartburn and indigestion as long as she can recall.  Recently started on  omeprazole 40 mg daily.  If she takes 40 mg in the morning and 20 mg at  night, her symptoms are controlled, otherwise breakthrough symptoms.  She  complains that the pill is large.  She was given a prescription for another  proton pump inhibitor but stated that non-generic medications are $3 with  her Medicaid card, and generic medications are $1, and she could not afford  the difference.  She denies vomiting, weight loss, or evidence of GI  bleeding.   PAST MEDICAL HISTORY:  1.  Unspecified neurologic disorder involving the lower portion of the lower      extremities.  She can walk with a walker.  2.  History of anxiety with panic disorder.  3.  Status post cholecystectomy.  4.  Status post tubal  ligation.   ALLERGIES:  Patient states that MORPHINE results in pruritus.   CURRENT MEDICATIONS:  Patient does not know her current medications but  thinks she is on Topamax twice daily, Zoloft 25 mg daily, omeprazole 40 mg  daily, unspecified dosage of Elavil, and one other medication that begins  with a T, which she takes four times daily.   FAMILY HISTORY:  Negative for gastrointestinal malignancy.   SOCIAL HISTORY:  Single with three children.  Lives with her children and  boyfriend.  She is disabled.  Smokes a half pack of cigarettes a day.  Denies alcohol use.   REVIEW OF SYSTEMS:  Per diagnostic evaluation form.   PHYSICAL EXAMINATION:  VITAL SIGNS:  Blood pressure 108/74, heart rate 78,  weight 142.4 pounds.  She is 5 feet 9 inches in height.  GENERAL:  Unkempt-appearing female in no acute distress.  She is sitting in  a wheelchair.  She is alert and oriented.  HEENT:  Sclerae are anicteric.  Conjunctivae are pink.  Oral mucosa  are  intact.  Dentition is poor.  Tongue is tobacco-stained.  NECK:  There is no adenopathy.  LUNGS:  Clear.  HEART:  Regular.  ABDOMEN:  Soft without tenderness, mass, or hernia.  Good bowel sounds  heard.  EXTREMITIES:  Without edema.   IMPRESSION:  1.  Gastroesophageal reflux disease with intermittent dysphagia.  Rule out      type 2 stricture.  Patient reports some remote history of abnormally      small esophagus.  Question congenital abnormality.  2.  Unspecified neurologic disorder, as discussed above.  3.  Anxiety.   RECOMMENDATIONS:  1.  Continue proton pump inhibitor therapy.  I have also given her samples      of Prevacid Solutab, should swallowing pills be a problem.  2.  Schedule barium swallow with tablet to define the anatomy and exclude      congential abnormality.  As well, ascertain if upper endoscopy with      esophageal dilation would be helpful.  I did discuss with her upper      endoscopy with esophageal dilation.  The  nature of the procedure, as      well as its risks, benefits, and alternatives, should that be necessary.      She understood and agreed to proceed along these lines.  Otherwise, she      will continue her general medical care with Dr. Christell Stewart in Mr. Tiffany Stewart      office.                                   Tiffany Stewart. Tiffany Stewart., MD   JNP/MedQ  DD:  03/13/2006  DT:  03/13/2006  Job #:  [id number]   cc:   Tiffany Penna, MD

## 2011-04-10 ENCOUNTER — Ambulatory Visit: Payer: Medicare Other | Admitting: Physical Medicine & Rehabilitation

## 2011-04-10 ENCOUNTER — Encounter: Payer: Medicare Other | Attending: Physical Medicine & Rehabilitation

## 2011-04-10 DIAGNOSIS — M25819 Other specified joint disorders, unspecified shoulder: Secondary | ICD-10-CM | POA: Insufficient documentation

## 2011-04-10 DIAGNOSIS — G8220 Paraplegia, unspecified: Secondary | ICD-10-CM | POA: Insufficient documentation

## 2011-04-10 DIAGNOSIS — G114 Hereditary spastic paraplegia: Secondary | ICD-10-CM

## 2011-04-10 DIAGNOSIS — S43429A Sprain of unspecified rotator cuff capsule, initial encounter: Secondary | ICD-10-CM

## 2011-04-12 NOTE — Procedures (Signed)
NAMEJAYLISE, Tiffany Stewart NO.:  [id number]  MEDICAL RECORD NO.:  [id number]           PATIENT TYPE:  LOCATION:                                 FACILITY:  PHYSICIAN:  Erick Colace, M.D.DATE OF BIRTH:  [birth year]  DATE OF PROCEDURE:  04/10/2011 DATE OF DISCHARGE:                              OPERATIVE REPORT  PROCEDURE:  Left shoulder injection.  INDICATION:  Impingement syndrome, history of spastic paraplegia, chronic wheelchair usage, pain with overhead activity, and positive impingement sign.  Informed consent was obtained as she is already has failed nonsteroidal antiinflammatory treatment.  Informed consent was obtained after describing risks and benefits of the procedure with the patient including bleeding, bruising, and infection. She elects to proceed and has given written consent.  The patient was placed in seated position.  Posterolateral approach utilized, area was marked and prepped with Betadine and alcohol, entered with 25-gauge inch and half needle, 1% lidocaine x4 mL plus 1 mL of 40 mg/mL Depo-Medrol injected.  The patient tolerated the procedure well.  Postprocedure instructions were given.     Erick Colace, M.D. Electronically Signed    AEK/MEDQ  D:  04/10/2011 12:13:06  T:  04/10/2011 15:33:03  Job:  [id number]

## 2011-04-12 NOTE — Assessment & Plan Note (Signed)
HISTORY:  The patient originally here for left shoulder pain, but gave me additional history today which is separate from the other complaint.  She complains of decreasing standing tolerance.  Used to be able to finish all her dishes at the sink standing.  Now needs to take 1 or 2 breaks in between.  Also reporting reduced ability to ambulate short distances in the home.  She does have a AFO.  She would like them reattached to different pairs of shoes which is more comfortable for her.  She has not had any therapy for long period of time.  REVIEW OF SYSTEMS:  Positive for left shoulder pain.  PHYSICAL EXAMINATION:  VITAL SIGNS:  Blood pressure 111/92, pulse 69, respirations 18, O2 sat 99% on room air. GENERAL:  No acute stress.  Mood and affect appropriate. MUSCULOSKELETAL:  Her lower extremity strength is 4- at knee extensors, 3- in the hip flexors, and 2- ankle dorsiflexors.  Upper extremity strength is normal.  She does have some pain with left shoulder abduction, positive impingement sign currently.  IMPRESSION: 1. Spastic paraplegia.  I think she would benefit from some physical     therapy has gotten deconditioned.  This was not atraumatic,     paraplegia from affecting her spinal cord but rather hereditary.     We will set her up for some additional physical therapy to see we     can improve her standing balance intolerance and some limited     ambulation, household distances. 2. Left shoulder impingement syndrome.  We will inject today.     Discussed with the patient and agrees with plan. 3. Spasticity.  Continue her baclofen and tizanidine.     Erick Colace, M.D. Electronically Signed    AEK/MedQ D:  04/10/2011 12:16:13  T:  04/10/2011 15:28:43  Job #:  [id number]  cc:   [facility]

## 2011-05-09 ENCOUNTER — Ambulatory Visit: Payer: Medicare Other | Admitting: Physical Medicine & Rehabilitation

## 2011-05-09 ENCOUNTER — Encounter: Payer: Medicare Other | Attending: Physical Medicine & Rehabilitation

## 2011-05-09 DIAGNOSIS — M25819 Other specified joint disorders, unspecified shoulder: Secondary | ICD-10-CM | POA: Insufficient documentation

## 2011-05-09 DIAGNOSIS — IMO0001 Reserved for inherently not codable concepts without codable children: Secondary | ICD-10-CM | POA: Insufficient documentation

## 2011-05-09 DIAGNOSIS — G8220 Paraplegia, unspecified: Secondary | ICD-10-CM | POA: Insufficient documentation

## 2011-05-09 DIAGNOSIS — G114 Hereditary spastic paraplegia: Secondary | ICD-10-CM

## 2011-05-09 NOTE — Assessment & Plan Note (Signed)
Tiffany Stewart is a 40 year old female born 1971-05-01, has history of spastic paraplegia which is hereditary.  She is referred for AFOs bilateral lower extremities to assist with ambulating short distances as well as standing and transferring.  She has not had these replaced yet. She has had an episode today, where she felt to her knees without any type of braces on.  I asked physical therapy to start working with her last month.  Apparently she missed a call to make the scheduled appointment.  She has had no significant injuries with the fall.  She had no significant pain.  Her main pain is in left shoulder which has been over the last month or so.  She was scheduled for ultrasound injection today and we will examine her before deciding to do this.  In terms of her wrist and hand pain due to carpal tunnel, she has had improvements with using her wrist splints once again.  Her left shoulder pain is 7/10, denies any neck pain.  No pain shooting down the arm.  She has spasms and dizziness on a review of systems.  SOCIAL HISTORY:  She lives with her children and her husband.  Smokes pack per day.  Blood pressure 133/86, pulse 66, respirations 18 and O2 sat 99% on room air.  General in no acute stress.  Orientation x3.  She has tenderness in the left upper trapezius area.  She has negative impingement sign.  She has good strength in the upper extremity.  She has absent ankle dorsiflexion on the bilateral lower extremities.  She has clonus bilateral ankles and hyperactive reflex bilateral knees.  Lower extremity strength is 3- knee extension.  IMPRESSION:  Spastic paraplegia hereditary.  We will need to get her back in AFOs.  She continues on antispasticity medicines, i.e. tizanidine and baclofen.  She uses Lidoderm patches for pain in her back area as well as Voltaren gel.  We will do trigger point injection in the left shoulder today.  No ultrasound needed given her exam.  No need  for carpal tunnel injection today.     Erick Colace, M.D. Electronically Signed    AEK/MedQ D:  05/09/2011 11:42:37  T:  05/09/2011 12:46:04  Job #:  [id number]

## 2011-05-09 NOTE — Procedures (Signed)
[name], [name] NO.:  [id number]  MEDICAL RECORD NO.:  [id number]           PATIENT TYPE:  O  LOCATION:  [facility]                          FACILITY:  [facility]  PHYSICIAN:  Erick Colace, M.D.DATE OF BIRTH:  15-Aug-1970  DATE OF PROCEDURE: DATE OF DISCHARGE:                              OPERATIVE REPORT  Tiffany Stewart is a 40 year old female born on 1971/02/06, who presents with left upper trap pain on exam appears to be myofascial related.  PROCEDURES:  Trigger point injection.  Two areas in the left upper trap were marked and prepped with Betadine and alcohol, entered after informed consent was obtained.  A 1 mL of 1% lidocaine injected at each of 2 sites.  Positive twitch sign.  The patient tolerated the procedure well.  Postprocedure instructions given. I will see her back in 1 month.     Erick Colace, M.D. Electronically Signed    AEK/MEDQ  D:  05/09/2011 11:39:41  T:  05/09/2011 12:49:06  Job:  [id number]

## 2011-05-11 LAB — WET PREP, GENITAL
Trich, Wet Prep: NONE SEEN
Yeast Wet Prep HPF POC: NONE SEEN

## 2011-05-11 LAB — CBC
Platelets: 248
RDW: 11.9
WBC: 6.9

## 2011-05-11 LAB — DIFFERENTIAL
Basophils Absolute: 0
Eosinophils Absolute: 0.1
Eosinophils Relative: 1
Lymphocytes Relative: 32
Lymphs Abs: 2.2
Neutrophils Relative %: 62

## 2011-05-11 LAB — BASIC METABOLIC PANEL
BUN: 15
Creatinine, Ser: 0.66
GFR calc non Af Amer: >60
Glucose, Bld: 89

## 2011-05-11 LAB — URINE MICROSCOPIC-ADD ON

## 2011-05-11 LAB — URINALYSIS, ROUTINE W REFLEX MICROSCOPIC
Leukocytes, UA: NEGATIVE
Nitrite: NEGATIVE
Specific Gravity, Urine: >1.03 — ABNORMAL HIGH
Urobilinogen, UA: 1
pH: 5.5

## 2011-05-11 LAB — PREGNANCY, URINE: Preg Test, Ur: NEGATIVE

## 2011-05-16 LAB — COMPREHENSIVE METABOLIC PANEL
ALT: 10
BUN: 18
CO2: 25
Calcium: 8.9
Creatinine, Ser: 0.62
GFR calc non Af Amer: >60
Glucose, Bld: 86
Sodium: 139
Total Protein: 6.9

## 2011-05-16 LAB — CBC
HCT: 42.1
Hemoglobin: 14.4
MCHC: 34.2
MCV: 102.8 — ABNORMAL HIGH
RBC: 4.09
RDW: 12.7

## 2011-05-16 LAB — DIFFERENTIAL
Eosinophils Absolute: 0.1
Lymphocytes Relative: 33
Lymphs Abs: 2.5
Monocytes Relative: 5
Neutro Abs: 4.6
Neutrophils Relative %: 60

## 2011-05-16 LAB — URINALYSIS, ROUTINE W REFLEX MICROSCOPIC
Hgb urine dipstick: NEGATIVE
Ketones, ur: 40 — AB
Nitrite: NEGATIVE
Specific Gravity, Urine: >1.03 — ABNORMAL HIGH
pH: 6

## 2011-05-16 LAB — PREGNANCY, URINE: Preg Test, Ur: NEGATIVE

## 2011-05-16 LAB — LIPASE, BLOOD: Lipase: 19

## 2011-05-25 ENCOUNTER — Ambulatory Visit (HOSPITAL_COMMUNITY): Payer: Medicare Other | Admitting: Physical Therapy

## 2011-06-13 ENCOUNTER — Ambulatory Visit: Payer: Medicare Other | Admitting: Physical Medicine & Rehabilitation

## 2011-06-24 ENCOUNTER — Other Ambulatory Visit: Payer: Self-pay | Admitting: Internal Medicine

## 2011-06-26 NOTE — Telephone Encounter (Signed)
Must have appointment for further refills  

## 2011-12-11 DIAGNOSIS — N390 Urinary tract infection, site not specified: Secondary | ICD-10-CM | POA: Diagnosis not present

## 2011-12-11 DIAGNOSIS — R319 Hematuria, unspecified: Secondary | ICD-10-CM | POA: Diagnosis not present

## 2011-12-26 DIAGNOSIS — N390 Urinary tract infection, site not specified: Secondary | ICD-10-CM | POA: Diagnosis not present

## 2011-12-26 DIAGNOSIS — R109 Unspecified abdominal pain: Secondary | ICD-10-CM | POA: Diagnosis not present

## 2012-04-18 ENCOUNTER — Other Ambulatory Visit: Payer: Self-pay | Admitting: Internal Medicine

## 2012-04-19 ENCOUNTER — Encounter: Payer: Medicare Other | Attending: Physical Medicine & Rehabilitation

## 2012-04-19 ENCOUNTER — Ambulatory Visit: Payer: Medicare Other | Admitting: Physical Medicine & Rehabilitation

## 2012-04-19 DIAGNOSIS — M76899 Other specified enthesopathies of unspecified lower limb, excluding foot: Secondary | ICD-10-CM | POA: Insufficient documentation

## 2012-04-19 DIAGNOSIS — G5600 Carpal tunnel syndrome, unspecified upper limb: Secondary | ICD-10-CM | POA: Insufficient documentation

## 2012-04-19 DIAGNOSIS — G114 Hereditary spastic paraplegia: Secondary | ICD-10-CM | POA: Insufficient documentation

## 2012-04-22 ENCOUNTER — Ambulatory Visit: Payer: Medicare Other | Admitting: Physical Medicine & Rehabilitation

## 2012-04-29 ENCOUNTER — Ambulatory Visit: Payer: Medicare Other | Admitting: Physical Medicine & Rehabilitation

## 2012-05-10 ENCOUNTER — Ambulatory Visit (HOSPITAL_BASED_OUTPATIENT_CLINIC_OR_DEPARTMENT_OTHER): Payer: Medicare Other | Admitting: Physical Medicine & Rehabilitation

## 2012-05-10 ENCOUNTER — Encounter: Payer: Self-pay | Admitting: Physical Medicine & Rehabilitation

## 2012-05-10 VITALS — BP 137/78 | HR 66 | Resp 14 | Wt 135.0 lb

## 2012-05-10 DIAGNOSIS — M76899 Other specified enthesopathies of unspecified lower limb, excluding foot: Secondary | ICD-10-CM | POA: Diagnosis not present

## 2012-05-10 DIAGNOSIS — G114 Hereditary spastic paraplegia: Secondary | ICD-10-CM

## 2012-05-10 DIAGNOSIS — G5600 Carpal tunnel syndrome, unspecified upper limb: Secondary | ICD-10-CM | POA: Diagnosis not present

## 2012-05-10 DIAGNOSIS — M7061 Trochanteric bursitis, right hip: Secondary | ICD-10-CM

## 2012-05-10 DIAGNOSIS — G5603 Carpal tunnel syndrome, bilateral upper limbs: Secondary | ICD-10-CM

## 2012-05-10 MED ORDER — BACLOFEN 10 MG PO TABS: 10.0000 mg | ORAL_TABLET | Freq: Two times a day (BID) | ORAL | Status: DC

## 2012-05-10 MED ORDER — DICLOFENAC SODIUM 1 % TD GEL: 2.0000 g | Freq: Four times a day (QID) | TRANSDERMAL | Status: DC

## 2012-05-10 MED ORDER — LIDOCAINE 5 % EX PTCH: 1.0000 | MEDICATED_PATCH | CUTANEOUS | Status: DC

## 2012-05-10 MED ORDER — TIZANIDINE HCL 2 MG PO TABS: 2.0000 mg | ORAL_TABLET | Freq: Four times a day (QID) | ORAL | Status: DC | PRN

## 2012-05-10 NOTE — Progress Notes (Signed)
Subjective:    Patient ID: Tiffany Stewart, female    DOB: 1971/04/19, 41 y.o.   MRN: [id number]  HPI Complains of increasing numbness of the right hand and left hand. Wearing right wrist splint on left hand. Does not like the left wrist splint that she has from her primary physician. Complains of right hip pain which interferes with sleep Spasms increasing in the legs. Pain Inventory Average Pain 10 Pain Right Now 10 My pain is constant, tingling and aching  In the last 24 hours, has pain interfered with the following? General activity 7 Relation with others 5 Enjoyment of life 5 What TIME of day is your pain at its worst? all of the time Sleep (in general) Poor  Pain is worse with: inactivity Pain improves with: medication Relief from Meds: 0  Mobility ability to climb steps?  no do you drive?  no use a wheelchair  Function disabled: date disabled  I need assistance with the following:  meal prep, household duties and shopping  Neuro/Psych bladder control problems numbness  Prior Studies Any changes since last visit?  no  Physicians involved in your care Any changes since last visit?  no   History reviewed. No pertinent family history. History   Social History  . Marital Status: Single    Spouse Name: N/A    Number of Children: N/A  . Years of Education: N/A   Social History Main Topics  . Smoking status: Current Every Day Smoker -- 1.0 packs/day for 27 years    Types: Cigarettes  . Smokeless tobacco: Never Used  . Alcohol Use: None  . Drug Use: None  . Sexually Active: None   Other Topics Concern  . None   Social History Narrative  . None   Past Surgical History  Procedure Date  . Cholecystectomy   . Cesarean section    Past Medical History  Diagnosis Date  . GERD (gastroesophageal reflux disease)    BP 137/78  Pulse 66  Resp 14  Wt 135 lb (61.236 kg)  SpO2 100%   Review of Systems  Respiratory: Positive for cough.     Gastrointestinal: Positive for diarrhea and constipation.  Musculoskeletal: Positive for back pain and gait problem.       Spasms  Neurological: Positive for numbness.  All other systems reviewed and are negative.       Objective:   Physical Exam  Constitutional: She appears well-developed and well-nourished.  HENT:  Head: Normocephalic and atraumatic.  Eyes: Conjunctivae normal and EOM are normal. Pupils are equal, round, and reactive to light.  Neurological: No sensory deficit. Coordination and gait abnormal.       Motor strength is 3 minus in bilateral knee extensors 0 at the ankle dorsiflexors and plantar flexors Tenderness to palpation over the right greater trochanter Nonambulatory. Using wheelchair No atrophy in the thenar eminence on both hands.  Psychiatric: Her mood appears anxious.          Assessment & Plan:  1. Spastic paraparesis hereditary. Continue baclofen 10 mg twice a day increased has ended being to 2 mg 4 times a day 2. Bilateral carpal tunnel syndrome have written for a wrist splint US guided injection next mo 3. Right trochanteric bursitis we will inject today   Right trochanteric bursa injection Indication right trochanteric bursitis pain which is interfering with sleep He should unable to take nonsteroidals do to chronic abdominal pain Informed consent was obtained after describing risks and benefits of the procedure  with the patient these include bleeding bruising and infection she elects to proceed and has given written consent  The patient placed in a forward leaning position. Nurses assist the patient for safety reasons. Area marked and prepped with Betadine and alcohol over the right trochanteric bursa. Enter with a 25-gauge inch and a half needle. After negative draw back for blood 1 cc of 40 November cc Depo-Medrol and 4 cc 1% lidocaine were injected. Patient tolerated procedure well. Post procedure instructions given

## 2012-05-10 NOTE — Patient Instructions (Signed)
Please take the prescription to [facility] to get a left wrist splint Next month I will do a wrist injection with ultrasound guidance on the right side for your carpal tunnel We will increase Zanaflex to 4 times per day Continue baclofen Continue Lidoderm patch Continue Voltaren gel We injected the right hip for trochanteric bursitis

## 2012-06-13 ENCOUNTER — Encounter: Payer: Medicare Other | Attending: Physical Medicine & Rehabilitation

## 2012-06-13 ENCOUNTER — Ambulatory Visit (HOSPITAL_BASED_OUTPATIENT_CLINIC_OR_DEPARTMENT_OTHER): Payer: Medicare Other | Admitting: Physical Medicine & Rehabilitation

## 2012-06-13 ENCOUNTER — Encounter: Payer: Self-pay | Admitting: Physical Medicine & Rehabilitation

## 2012-06-13 DIAGNOSIS — G5600 Carpal tunnel syndrome, unspecified upper limb: Secondary | ICD-10-CM | POA: Diagnosis not present

## 2012-06-13 DIAGNOSIS — G5601 Carpal tunnel syndrome, right upper limb: Secondary | ICD-10-CM

## 2012-06-13 DIAGNOSIS — R51 Headache: Secondary | ICD-10-CM | POA: Diagnosis not present

## 2012-06-13 DIAGNOSIS — G114 Hereditary spastic paraplegia: Secondary | ICD-10-CM | POA: Diagnosis not present

## 2012-06-13 DIAGNOSIS — M76899 Other specified enthesopathies of unspecified lower limb, excluding foot: Secondary | ICD-10-CM | POA: Insufficient documentation

## 2012-06-13 NOTE — Patient Instructions (Addendum)
Return in 1 month for L Carpal tunnel injection

## 2012-06-13 NOTE — Progress Notes (Signed)
  Subjective:    Patient ID: Tiffany Stewart, female    DOB: 1971/05/23, 41 y.o.   MRN: [id number]  HPI  Pain Inventory Average Pain 8 Pain Right Now 9 My pain is sharp, stabbing and aching  In the last 24 hours, has pain interfered with the following? General activity 5 Relation with others 10 Enjoyment of life 6 What TIME of day is your pain at its worst? all day Sleep (in general) Fair  Pain is worse with: some activites Pain improves with: medication Relief from Meds: 4  Mobility how many minutes can you walk? none ability to climb steps?  no do you drive?  no use a wheelchair Do you have any goals in this area?  no  Function disabled: date disabled age 33 I need assistance with the following:  meal prep Do you have any goals in this area?  no  Neuro/Psych numbness spasms  Prior Studies Any changes since last visit?  no  Physicians involved in your care Any changes since last visit?  no   History reviewed. No pertinent family history. History   Social History  . Marital Status: Single    Spouse Name: N/A    Number of Children: N/A  . Years of Education: N/A   Social History Main Topics  . Smoking status: Current Every Day Smoker -- 1.0 packs/day for 27 years    Types: Cigarettes  . Smokeless tobacco: Never Used  . Alcohol Use: None  . Drug Use: None  . Sexually Active: None   Other Topics Concern  . None   Social History Narrative  . None   Past Surgical History  Procedure Date  . Cholecystectomy   . Cesarean section    Past Medical History  Diagnosis Date  . GERD (gastroesophageal reflux disease)    There were no vitals taken for this visit.     Review of Systems  Musculoskeletal: Positive for myalgias and arthralgias.  Neurological: Positive for numbness.  All other systems reviewed and are negative.       Objective:   Physical Exam        Assessment & Plan:  Carpal tunnel injection ultrasound guidance  Indication:  Median neuropathy at the wrist documented by EMG or ultrasound and interfering with sleep and other functional activities. Symptoms are not relieved by conservative care.  Informed consent was obtained after describing risks and benefits of the procedure with the patient. These include bleeding bruising and infection as well as nerve injury. Patient elected to proceed and has given written consent. The distal wrist crease was marked and prepped with Betadine. A 30-gauge 1/2 inch needle was inserted and 0.25 ML's of 1% lidocaine injected into the skin and subcutaneous tissue. Then a 27-gauge 1/2 inch needle was inserted into the carpal tunnel and 0.25 mL of depomedrol 40 mg per mL was injected. Patient tolerated procedure well. Post procedure instructions given.

## 2012-06-17 ENCOUNTER — Telehealth: Payer: Self-pay | Admitting: Physical Medicine & Rehabilitation

## 2012-06-17 DIAGNOSIS — G114 Hereditary spastic paraplegia: Secondary | ICD-10-CM

## 2012-06-17 NOTE — Telephone Encounter (Signed)
[facility],Dr Terrace Arabia

## 2012-06-17 NOTE — Telephone Encounter (Signed)
Can you give me more options regarding a headache doctor.  Dr. Arbutus Leas does not treat headaches  Also, patient would like an order for a new wheelchair.

## 2012-06-18 NOTE — Telephone Encounter (Signed)
She is also requesting an order for a new wheelchair.

## 2012-06-19 NOTE — Telephone Encounter (Signed)
Here are the sources you were asking about.

## 2012-07-09 ENCOUNTER — Ambulatory Visit: Payer: Medicare Other | Admitting: Physical Medicine & Rehabilitation

## 2012-08-16 ENCOUNTER — Other Ambulatory Visit: Payer: Self-pay | Admitting: Internal Medicine

## 2012-08-20 ENCOUNTER — Telehealth: Payer: Self-pay

## 2012-08-20 NOTE — Telephone Encounter (Signed)
Faxed request for refill of Omeprazole back to pharmacy with a note that patient could not have a refill without and office visit, which was communicated the last time she needed a refill

## 2012-09-24 ENCOUNTER — Telehealth: Payer: Self-pay

## 2012-09-24 NOTE — Telephone Encounter (Signed)
Dr Glennon Mac? [facility] called regarding patient.  Page [phone]

## 2012-09-25 NOTE — Telephone Encounter (Signed)
Page to Dr Glennon Mac returning call.

## 2012-10-15 ENCOUNTER — Other Ambulatory Visit: Payer: Self-pay | Admitting: Physical Medicine & Rehabilitation

## 2012-10-15 ENCOUNTER — Other Ambulatory Visit: Payer: Self-pay | Admitting: Internal Medicine

## 2012-10-23 DIAGNOSIS — I10 Essential (primary) hypertension: Secondary | ICD-10-CM | POA: Diagnosis not present

## 2012-10-23 DIAGNOSIS — F411 Generalized anxiety disorder: Secondary | ICD-10-CM | POA: Diagnosis not present

## 2012-10-23 DIAGNOSIS — E785 Hyperlipidemia, unspecified: Secondary | ICD-10-CM | POA: Diagnosis not present

## 2012-10-29 ENCOUNTER — Other Ambulatory Visit: Payer: Self-pay | Admitting: *Deleted

## 2012-10-29 DIAGNOSIS — F411 Generalized anxiety disorder: Secondary | ICD-10-CM

## 2012-10-29 MED ORDER — ALPRAZOLAM 0.25 MG PO TABS: 0.2500 mg | ORAL_TABLET | Freq: Two times a day (BID) | ORAL | Status: DC

## 2012-11-13 ENCOUNTER — Telehealth: Payer: Self-pay | Admitting: Nurse Practitioner

## 2012-11-13 NOTE — Telephone Encounter (Signed)
Will NTBS to discuss Changing of meds

## 2012-11-13 NOTE — Telephone Encounter (Signed)
Pt aware and said she has appt 4/14 with mmm and just wants to keep that appt.

## 2012-11-13 NOTE — Telephone Encounter (Signed)
Please advise 

## 2012-11-25 ENCOUNTER — Ambulatory Visit (INDEPENDENT_AMBULATORY_CARE_PROVIDER_SITE_OTHER): Payer: Medicare Other | Admitting: Nurse Practitioner

## 2012-11-25 ENCOUNTER — Encounter: Payer: Self-pay | Admitting: Nurse Practitioner

## 2012-11-25 VITALS — BP 118/81 | HR 70 | Temp 97.7°F

## 2012-11-25 DIAGNOSIS — I10 Essential (primary) hypertension: Secondary | ICD-10-CM | POA: Diagnosis not present

## 2012-11-25 DIAGNOSIS — F329 Major depressive disorder, single episode, unspecified: Secondary | ICD-10-CM | POA: Diagnosis not present

## 2012-11-25 DIAGNOSIS — F32A Depression, unspecified: Secondary | ICD-10-CM

## 2012-11-25 MED ORDER — LISINOPRIL 10 MG PO TABS: 10.0000 mg | ORAL_TABLET | Freq: Every day | ORAL | Status: DC

## 2012-11-25 MED ORDER — CITALOPRAM HYDROBROMIDE 20 MG PO TABS: 20.0000 mg | ORAL_TABLET | Freq: Every day | ORAL | Status: DC

## 2012-11-25 NOTE — Patient Instructions (Signed)

## 2012-11-25 NOTE — Progress Notes (Signed)
  Subjective:    Patient ID: Tiffany Stewart, female    DOB: 12/23/1970, 42 y.o.   MRN: [id number]  HPI- Patient here for follow up. Patient dx with with Hypertension. Started on HCTZ 25mg  but pateint says causes diarrhea.Diarrhea some better but still has daily  Anxiety/depression- Started on Zoloft- makes her feel Crazy. Wants to change to something else.    Review of Systems  Constitutional: Negative.   HENT: Negative.   Eyes: Negative.   Respiratory: Negative.   Cardiovascular: Negative.   Gastrointestinal: Negative.   Genitourinary: Negative.   Musculoskeletal: Negative.   Neurological: Positive for light-headedness.  Psychiatric/Behavioral: Negative.        Objective:   Physical Exam  Constitutional: She appears well-developed and well-nourished.  Cardiovascular: Normal rate, regular rhythm, normal heart sounds and intact distal pulses.   Pulmonary/Chest: Effort normal and breath sounds normal.  Abdominal: Soft. Bowel sounds are normal. She exhibits no mass. There is no tenderness. There is no guarding.  Skin: Skin is warm and dry.  Psychiatric: She has a normal mood and affect. Her behavior is normal. Judgment and thought content normal.   BP 118/81  Pulse 70  Temp(Src) 97.7 F (36.5 C) (Oral)        Assessment & Plan:  Essential hypertension, benign - Plan: lisinopril (PRINIVIL,ZESTRIL) 10 MG tablet  Depression - Plan: citalopram (CELEXA) 20 MG tablet   Stop Zoloft- Rx sent to pharamacy for Celexa 20mg  Qd- Stress management Stop HCTZ- Lisinopril 10 mg sent to pharmacy-Low NA= diet Low fat diet and exercise if can RO for recheck in 3 months Tiffany Daphine Deutscher, FNP

## 2012-12-07 ENCOUNTER — Other Ambulatory Visit: Payer: Self-pay | Admitting: Nurse Practitioner

## 2012-12-09 NOTE — Telephone Encounter (Signed)
LAST RF 11/05/12. LAST OV 10/23/12. PLEASE CALL IN [facility] [phone]

## 2012-12-09 NOTE — Telephone Encounter (Signed)
Called in.

## 2012-12-09 NOTE — Telephone Encounter (Signed)
Please call in Xanax 0.25 1PO bid 0 refills

## 2012-12-18 ENCOUNTER — Telehealth: Payer: Self-pay | Admitting: Nurse Practitioner

## 2012-12-18 ENCOUNTER — Other Ambulatory Visit: Payer: Self-pay | Admitting: Internal Medicine

## 2012-12-18 NOTE — Telephone Encounter (Signed)
NTBS to write note in chart about wheel chair.

## 2012-12-18 NOTE — Telephone Encounter (Signed)
Please advise 

## 2012-12-19 NOTE — Telephone Encounter (Signed)
Patient aware needs to make an appointment to get a new wheelchair

## 2013-01-08 ENCOUNTER — Other Ambulatory Visit: Payer: Self-pay | Admitting: Internal Medicine

## 2013-01-08 ENCOUNTER — Other Ambulatory Visit: Payer: Self-pay | Admitting: Nurse Practitioner

## 2013-01-09 NOTE — Telephone Encounter (Signed)
Please call in xanax RX 

## 2013-01-09 NOTE — Telephone Encounter (Signed)
LAST RF 12/09/12. LAST OV 11/25/12. CALL IN [place] [phone].

## 2013-01-10 ENCOUNTER — Other Ambulatory Visit: Payer: Self-pay | Admitting: Nurse Practitioner

## 2013-01-10 NOTE — Telephone Encounter (Signed)
Med called into pharm 

## 2013-01-16 ENCOUNTER — Telehealth: Payer: Self-pay | Admitting: Family Medicine

## 2013-01-16 ENCOUNTER — Telehealth: Payer: Self-pay | Admitting: Internal Medicine

## 2013-01-16 MED ORDER — ESOMEPRAZOLE MAGNESIUM 40 MG PO CPDR: 40.0000 mg | DELAYED_RELEASE_CAPSULE | Freq: Two times a day (BID) | ORAL | Status: DC

## 2013-01-16 NOTE — Telephone Encounter (Signed)
Refilled at the request of patient who has not been able to come in because of her daughters health problems.  Sent message to pharmacy that she would have to come in asap for any more refills

## 2013-01-16 NOTE — Telephone Encounter (Signed)
appt given for 6/19 at 2:00 with mmm

## 2013-01-30 ENCOUNTER — Ambulatory Visit: Payer: Medicare Other | Admitting: Nurse Practitioner

## 2013-02-03 ENCOUNTER — Telehealth: Payer: Self-pay | Admitting: Nurse Practitioner

## 2013-02-03 NOTE — Telephone Encounter (Signed)
appt made

## 2013-02-06 ENCOUNTER — Other Ambulatory Visit: Payer: Self-pay | Admitting: Nurse Practitioner

## 2013-02-07 NOTE — Telephone Encounter (Signed)
Patient last seen in office on 11-25-12 by MMM. Rx last filled on 01-08-13 for #60. Please advise. If approved please have nurse phone in to pharmacy. Thank you

## 2013-02-07 NOTE — Telephone Encounter (Signed)
Please call in rx for xanax with 2 refills 

## 2013-02-07 NOTE — Telephone Encounter (Signed)
Called in.

## 2013-02-17 ENCOUNTER — Encounter: Payer: Self-pay | Admitting: Nurse Practitioner

## 2013-02-17 ENCOUNTER — Ambulatory Visit (INDEPENDENT_AMBULATORY_CARE_PROVIDER_SITE_OTHER): Payer: Medicare Other | Admitting: Nurse Practitioner

## 2013-02-17 VITALS — BP 122/80 | HR 68 | Temp 97.6°F

## 2013-02-17 DIAGNOSIS — K589 Irritable bowel syndrome without diarrhea: Secondary | ICD-10-CM

## 2013-02-17 DIAGNOSIS — G43909 Migraine, unspecified, not intractable, without status migrainosus: Secondary | ICD-10-CM | POA: Diagnosis not present

## 2013-02-17 DIAGNOSIS — I10 Essential (primary) hypertension: Secondary | ICD-10-CM

## 2013-02-17 DIAGNOSIS — F329 Major depressive disorder, single episode, unspecified: Secondary | ICD-10-CM | POA: Diagnosis not present

## 2013-02-17 DIAGNOSIS — K219 Gastro-esophageal reflux disease without esophagitis: Secondary | ICD-10-CM

## 2013-02-17 DIAGNOSIS — G839 Paralytic syndrome, unspecified: Secondary | ICD-10-CM | POA: Diagnosis not present

## 2013-02-17 DIAGNOSIS — F32A Depression, unspecified: Secondary | ICD-10-CM

## 2013-02-17 MED ORDER — ESOMEPRAZOLE MAGNESIUM 40 MG PO CPDR: 40.0000 mg | DELAYED_RELEASE_CAPSULE | Freq: Two times a day (BID) | ORAL | Status: DC

## 2013-02-17 MED ORDER — DICYCLOMINE HCL 20 MG PO TABS: 20.0000 mg | ORAL_TABLET | Freq: Four times a day (QID) | ORAL | Status: DC

## 2013-02-17 MED ORDER — LISINOPRIL 10 MG PO TABS: 10.0000 mg | ORAL_TABLET | Freq: Every day | ORAL | Status: DC

## 2013-02-17 NOTE — Progress Notes (Signed)
Subjective:    Patient ID: Tiffany Stewart, female    DOB: 1970-09-05, 42 y.o.   MRN: [id number]  Hypertension This is a chronic problem. The current episode started more than 1 year ago. The problem has been gradually improving since onset. The problem is controlled. Associated symptoms include anxiety and headaches. Pertinent negatives include no chest pain or palpitations. Risk factors for coronary artery disease include stress and smoking/tobacco exposure. The current treatment provides moderate improvement. Compliance problems include exercise.  There is no history of a thyroid problem. There is no history of sleep apnea.  Gastrophageal Reflux She complains of coughing and heartburn. She reports no chest pain. This is a chronic problem. The current episode started more than 1 year ago. The problem occurs constantly. The problem has been waxing and waning. The heartburn duration is more than one hour. The heartburn is located in the substernum, LUQ and RUQ. The heartburn is of moderate intensity. The heartburn does not wake her from sleep. The heartburn does not limit her activity. The heartburn doesn't change with position. The symptoms are aggravated by certain foods. Risk factors include smoking/tobacco exposure and lack of exercise. She has tried a PPI for the symptoms. The treatment provided mild relief.   GAD Pt takes Xanax .25 mg BID, but states it seems not to be working as good as it use to. "I stay stressed and I go to bed with drama and wake up with drama".   Pt also states she needs a RX for a new wheelchair. The wheelchair seat is tore and she states she has fallen out of chair 6-7 times. Migraines Having one for the last several weeks- Has been to headache clinic in past but refuses to go back. Patient using goody powders takes 2-3X a day.  Review of Systems  Respiratory: Positive for cough.   Cardiovascular: Negative for chest pain and palpitations.  Gastrointestinal: Positive for  heartburn.  Neurological: Positive for dizziness and headaches.  All other systems reviewed and are negative.       Objective:   Physical Exam  Constitutional: She is oriented to person, place, and time. She appears well-developed and well-nourished.  HENT:  Head: Normocephalic.  Eyes: Pupils are equal, round, and reactive to light.  Neck: Normal range of motion. Neck supple. No thyromegaly present.  Cardiovascular: Normal rate, regular rhythm, normal heart sounds and intact distal pulses.   Pulmonary/Chest: Effort normal and breath sounds normal.  Abdominal: Soft. There is no tenderness. There is no rebound.  Musculoskeletal: She exhibits no edema.  Limited ROM in LE. Pt in wheelchair and can not walk, but can stand and pivot   Neurological: She is alert and oriented to person, place, and time. She has normal reflexes.  Skin: Skin is warm and dry.  Psychiatric: Her behavior is normal. Judgment and thought content normal. Her mood appears anxious.     BP 122/80  Pulse 68  Temp(Src) 97.6 F (36.4 C) (Oral)       Assessment & Plan:   1. Migraines   2. Depression   3. GERD (gastroesophageal reflux disease)   4. Spastic paralysis   5. IBS (irritable bowel syndrome)   6. Essential hypertension, benign    Orders Placed This Encounter  Procedures  . COMPLETE METABOLIC PANEL WITH GFR  . NMR Lipoprofile with Lipids   Meds ordered this encounter  Medications  . DISCONTD: ALPRAZolam (XANAX) 0.25 MG tablet    Sig:   . DISCONTD: tizanidine (ZANAFLEX)  2 MG capsule    Sig:   . DISCONTD: dicyclomine (BENTYL) 20 MG tablet    Sig:   . DISCONTD: CELEXA 20 MG tablet    Sig:   . DISCONTD: NEXIUM 40 MG capsule    Sig:   . esomeprazole (NEXIUM) 40 MG capsule    Sig: Take 1 capsule (40 mg total) by mouth 2 (two) times daily.    Dispense:  60 capsule    Refill:  5    Refilled this Nexium at the request of patient who states she has not been able to come in because of health  problems with her daughter.  She must make office visit asap for any further refills    Order Specific Question:  Supervising Provider    Answer:  Ernestina Penna [[id number]]  . lisinopril (PRINIVIL,ZESTRIL) 10 MG tablet    Sig: Take 1 tablet (10 mg total) by mouth daily.    Dispense:  30 tablet    Refill:  5    Order Specific Question:  Supervising Provider    Answer:  Ernestina Penna [[id number]]  . dicyclomine (BENTYL) 20 MG tablet    Sig: Take 1 tablet (20 mg total) by mouth every 6 (six) hours.    Dispense:  60 tablet    Refill:  5    Order Specific Question:  Supervising Provider    Answer:  Ernestina Penna [[id number]]   Continue all meds Labs pending Diet encouraged STOP GOODY POWDERS Excedrin Migraine OTC STOP SMOKING RX written for new wheelchair  Mary-Margaret Daphine Deutscher, FNP

## 2013-02-17 NOTE — Patient Instructions (Addendum)

## 2013-02-18 LAB — COMPLETE METABOLIC PANEL WITH GFR
AST: 16 U/L (ref 0–37)
Albumin: 4.2 g/dL (ref 3.5–5.2)
BUN: 16 mg/dL (ref 6–23)
Calcium: 9.1 mg/dL (ref 8.4–10.5)
Chloride: 107 mEq/L (ref 96–112)
Potassium: 3.8 mEq/L (ref 3.5–5.3)

## 2013-02-18 LAB — NMR LIPOPROFILE WITH LIPIDS
Cholesterol, Total: 182 mg/dL (ref <200)
HDL Particle Number: 23.8 umol/L — ABNORMAL LOW (ref >=30.5)
LDL Particle Number: 1217 nmol/L — ABNORMAL HIGH (ref <1000)
Large VLDL-P: 5.4 nmol/L — ABNORMAL HIGH (ref <=2.7)
Small LDL Particle Number: 331 nmol/L (ref <=527)
VLDL Size: 46.5 nm (ref <=46.6)

## 2013-02-21 ENCOUNTER — Telehealth: Payer: Self-pay | Admitting: Nurse Practitioner

## 2013-02-21 NOTE — Telephone Encounter (Signed)
Please review per MMM

## 2013-02-26 NOTE — Telephone Encounter (Signed)
FAXED 02/25/13

## 2013-03-04 NOTE — Telephone Encounter (Signed)
Pt says se called and was told they had not gotten the fax.  She says she really needs the new chair.

## 2013-05-23 ENCOUNTER — Other Ambulatory Visit: Payer: Self-pay | Admitting: Nurse Practitioner

## 2013-05-26 NOTE — Telephone Encounter (Signed)
Last filled 02/06/13, last seen 02/17/13. Route to pool B so it can be called into [facility] [phone]

## 2013-05-26 NOTE — Telephone Encounter (Signed)
Please refill xanax

## 2013-05-27 NOTE — Telephone Encounter (Signed)
RX called into [facility] in [place]

## 2013-07-25 ENCOUNTER — Telehealth: Payer: Self-pay | Admitting: Nurse Practitioner

## 2013-07-25 ENCOUNTER — Encounter: Payer: Self-pay | Admitting: Nurse Practitioner

## 2013-07-25 NOTE — Telephone Encounter (Signed)
Letter ready for pick up

## 2013-07-28 NOTE — Telephone Encounter (Signed)
Left message letter ready to be picked up.

## 2013-10-06 ENCOUNTER — Telehealth: Payer: Self-pay | Admitting: Nurse Practitioner

## 2013-10-06 DIAGNOSIS — K589 Irritable bowel syndrome without diarrhea: Secondary | ICD-10-CM

## 2013-10-06 DIAGNOSIS — K219 Gastro-esophageal reflux disease without esophagitis: Secondary | ICD-10-CM

## 2013-10-06 MED ORDER — DICYCLOMINE HCL 20 MG PO TABS: 20.0000 mg | ORAL_TABLET | Freq: Four times a day (QID) | ORAL | Status: DC

## 2013-10-06 MED ORDER — TIZANIDINE HCL 2 MG PO TABS: 2.0000 mg | ORAL_TABLET | Freq: Four times a day (QID) | ORAL | Status: DC | PRN

## 2013-10-06 MED ORDER — ESOMEPRAZOLE MAGNESIUM 40 MG PO CPDR: 40.0000 mg | DELAYED_RELEASE_CAPSULE | Freq: Two times a day (BID) | ORAL | Status: DC

## 2013-10-06 MED ORDER — ALPRAZOLAM 0.25 MG PO TABS: ORAL_TABLET | ORAL | Status: DC

## 2013-10-06 NOTE — Telephone Encounter (Signed)
Please call in xanax 0.25 po bid prn #[phone]  with 1 refills

## 2013-10-06 NOTE — Telephone Encounter (Signed)
Called in.

## 2013-10-27 ENCOUNTER — Ambulatory Visit (INDEPENDENT_AMBULATORY_CARE_PROVIDER_SITE_OTHER): Payer: Medicare Other | Admitting: Nurse Practitioner

## 2013-10-27 ENCOUNTER — Encounter: Payer: Self-pay | Admitting: Nurse Practitioner

## 2013-10-27 VITALS — BP 126/87 | HR 80 | Temp 97.1°F

## 2013-10-27 DIAGNOSIS — F17200 Nicotine dependence, unspecified, uncomplicated: Secondary | ICD-10-CM

## 2013-10-27 DIAGNOSIS — F32A Depression, unspecified: Secondary | ICD-10-CM

## 2013-10-27 DIAGNOSIS — I10 Essential (primary) hypertension: Secondary | ICD-10-CM | POA: Diagnosis not present

## 2013-10-27 DIAGNOSIS — F411 Generalized anxiety disorder: Secondary | ICD-10-CM

## 2013-10-27 DIAGNOSIS — K219 Gastro-esophageal reflux disease without esophagitis: Secondary | ICD-10-CM

## 2013-10-27 DIAGNOSIS — G8220 Paraplegia, unspecified: Secondary | ICD-10-CM

## 2013-10-27 DIAGNOSIS — F329 Major depressive disorder, single episode, unspecified: Secondary | ICD-10-CM

## 2013-10-27 DIAGNOSIS — F3289 Other specified depressive episodes: Secondary | ICD-10-CM

## 2013-10-27 MED ORDER — ESOMEPRAZOLE MAGNESIUM 40 MG PO CPDR: 40.0000 mg | DELAYED_RELEASE_CAPSULE | Freq: Two times a day (BID) | ORAL | Status: DC

## 2013-10-27 MED ORDER — CITALOPRAM HYDROBROMIDE 20 MG PO TABS: 20.0000 mg | ORAL_TABLET | Freq: Every day | ORAL | Status: DC

## 2013-10-27 MED ORDER — TIZANIDINE HCL 2 MG PO TABS: 2.0000 mg | ORAL_TABLET | Freq: Four times a day (QID) | ORAL | Status: DC | PRN

## 2013-10-27 MED ORDER — LISINOPRIL 10 MG PO TABS: 10.0000 mg | ORAL_TABLET | Freq: Every day | ORAL | Status: DC

## 2013-10-27 NOTE — Patient Instructions (Signed)
Stress Management Stress is a state of physical or mental tension that often results from changes in your life or normal routine. Some common causes of stress are:  Death of a loved one.  Injuries or severe illnesses.  Getting fired or changing jobs.  Moving into a new home. Other causes may be:  Sexual problems.  Business or financial losses.  Taking on a large debt.  Regular conflict with someone at home or at work.  Constant tiredness from lack of sleep. It is not just bad things that are stressful. It may be stressful to:  Win the lottery.  Get married.  Buy a new car. The amount of stress that can be easily tolerated varies from person to person. Changes generally cause stress, regardless of the types of change. Too much stress can affect your health. It may lead to physical or emotional problems. Too little stress (boredom) may also become stressful. SUGGESTIONS TO REDUCE STRESS:  Talk things over with your family and friends. It often is helpful to share your concerns and worries. If you feel your problem is serious, you may want to get help from a professional counselor.  Consider your problems one at a time instead of lumping them all together. Trying to take care of everything at once may seem impossible. List all the things you need to do and then start with the most important one. Set a goal to accomplish 2 or 3 things each day. If you expect to do too many in a single day you will naturally fail, causing you to feel even more stressed.  Do not use alcohol or drugs to relieve stress. Although you may feel better for a short time, they do not remove the problems that caused the stress. They can also be habit forming.  Exercise regularly - at least 3 times per week. Physical exercise can help to relieve that "uptight" feeling and will relax you.  The shortest distance between despair and hope is often a good night's sleep.  Go to bed and get up on time allowing  yourself time for appointments without being rushed.  Take a short "time-out" period from any stressful situation that occurs during the day. Close your eyes and take some deep breaths. Starting with the muscles in your face, tense them, hold it for a few seconds, then relax. Repeat this with the muscles in your neck, shoulders, hand, stomach, back and legs.  Take good care of yourself. Eat a balanced diet and get plenty of rest.  Schedule time for having fun. Take a break from your daily routine to relax. HOME CARE INSTRUCTIONS   Call if you feel overwhelmed by your problems and feel you can no longer manage them on your own.  Return immediately if you feel like hurting yourself or someone else. Document Released: 01/24/2001 Document Revised: 10/23/2011 Document Reviewed: 03/25/2013 Exit[employer] Patient Information 2014 [employer].  

## 2013-10-27 NOTE — Progress Notes (Signed)
Subjective:    Patient ID: Tiffany Stewart, female    DOB: 30-Sep-1970, 43 y.o.   MRN: [id number]  Hypertension This is a chronic problem. The current episode started more than 1 year ago. The problem is unchanged. The problem is controlled. Pertinent negatives include no blurred vision, chest pain, headaches, malaise/fatigue, neck pain, orthopnea, palpitations, PND or sweats. There are no associated agents to hypertension. Risk factors for coronary artery disease include sedentary lifestyle. Past treatments include ACE inhibitors. The current treatment provides significant improvement. Compliance problems include diet and exercise.   gerd Patient on nexium- has only been taking 1X a day and ordered BID- Says taht she is having some heartburn 3-4 X a week Depression and GAD Patient on celexa and xanax- doing well on combination- Since her last visit she has moved into her own place and left her husband- says has really decreased her stress level. Paraplegia Takes Tizandine to control muscle spasms- helps a lot - has occassional muscle cramps   Review of Systems  Constitutional: Negative for malaise/fatigue.  Eyes: Negative for blurred vision.  Cardiovascular: Negative for chest pain, palpitations, orthopnea and PND.  Musculoskeletal: Negative for neck pain.  Neurological: Negative for headaches.       Objective:   Physical Exam  Constitutional: She is oriented to person, place, and time. She appears well-developed.  HENT:  Right Ear: External ear normal.  Left Ear: External ear normal.  Mouth/Throat: Oropharynx is clear and moist.  Eyes: Pupils are equal, round, and reactive to light.  Neck: Normal range of motion. Neck supple.  Cardiovascular: Normal rate, regular rhythm and normal heart sounds.   Pulmonary/Chest: Effort normal and breath sounds normal.  Abdominal: Soft. Bowel sounds are normal.  Musculoskeletal:  Both feet are turned inward  Neurological: She is alert and  oriented to person, place, and time.  Skin: Skin is warm and dry.  Psychiatric: She has a normal mood and affect. Her behavior is normal. Judgment and thought content normal.   BP 126/87  Pulse 80  Temp(Src) 97.1 F (36.2 C) (Oral)        Assessment & Plan:   1. Paraplegia   2. TOBACCO ABUSE   3. GERD   4. ANXIETY   5. Depression   6. GERD (gastroesophageal reflux disease)   7. Essential hypertension, benign    Orders Placed This Encounter  Procedures  . CMP14+EGFR  . NMR, lipoprofile   Meds ordered this encounter  Medications  . citalopram (CELEXA) 20 MG tablet    Sig: Take 1 tablet (20 mg total) by mouth daily.    Dispense:  30 tablet    Refill:  3    Order Specific Question:  Supervising Provider    Answer:  Chipper Herb [[id number]]  . esomeprazole (NEXIUM) 40 MG capsule    Sig: Take 1 capsule (40 mg total) by mouth 2 (two) times daily.    Dispense:  60 capsule    Refill:  5    Refilled this Nexium at the request of patient who states she has not been able to come in because of health problems with her daughter.  She must make office visit asap for any further refills    Order Specific Question:  Supervising Provider    Answer:  Chipper Herb [[id number]]  . lisinopril (PRINIVIL,ZESTRIL) 10 MG tablet    Sig: Take 1 tablet (10 mg total) by mouth daily.    Dispense:  30 tablet  Refill:  5    Order Specific Question:  Supervising Provider    Answer:  Chipper Herb [[id number]]  . tiZANidine (ZANAFLEX) 2 MG tablet    Sig: Take 1 tablet (2 mg total) by mouth every 6 (six) hours as needed.    Dispense:  120 tablet    Refill:  6    Order Specific Question:  Supervising Provider    Answer:  Chipper Herb [[id number]]   Stress management discussed Labs pending Health maintenance reviewed Diet and exercise encouraged Continue all meds Follow up  In 3 months   [place], FNP

## 2013-10-28 LAB — CMP14+EGFR
ALBUMIN: 4.5 g/dL (ref 3.5–5.5)
ALK PHOS: 69 IU/L (ref 39–117)
ALT: 5 IU/L (ref 0–32)
AST: 12 IU/L (ref 0–40)
Albumin/Globulin Ratio: 2.1 (ref 1.1–2.5)
BUN / CREAT RATIO: 23 (ref 9–23)
BUN: 11 mg/dL (ref 6–24)
CO2: 19 mmol/L (ref 18–29)
CREATININE: 0.47 mg/dL — AB (ref 0.57–1.00)
Calcium: 9.3 mg/dL (ref 8.7–10.2)
Chloride: 103 mmol/L (ref 97–108)
GFR, EST AFRICAN AMERICAN: 143 mL/min/{1.73_m2} (ref >59)
GFR, EST NON AFRICAN AMERICAN: 124 mL/min/{1.73_m2} (ref >59)
GLOBULIN, TOTAL: 2.1 g/dL (ref 1.5–4.5)
Glucose: 90 mg/dL (ref 65–99)
Potassium: 4.1 mmol/L (ref 3.5–5.2)
Sodium: 140 mmol/L (ref 134–144)
Total Bilirubin: <0.2 mg/dL (ref 0.0–1.2)
Total Protein: 6.6 g/dL (ref 6.0–8.5)

## 2013-10-28 LAB — NMR, LIPOPROFILE
Cholesterol: 154 mg/dL (ref <200)
HDL Cholesterol by NMR: 39 mg/dL — ABNORMAL LOW (ref >=40)
HDL PARTICLE NUMBER: 30.3 umol/L — AB (ref >=30.5)
LDL Particle Number: 1150 nmol/L — ABNORMAL HIGH (ref <1000)
LDL Size: 21.2 nm (ref >20.5)
LDLC SERPL CALC-MCNC: 91 mg/dL (ref <100)
LP-IR SCORE: 50 — AB (ref <=45)
Small LDL Particle Number: 568 nmol/L — ABNORMAL HIGH (ref <=527)
Triglycerides by NMR: 118 mg/dL (ref <150)

## 2013-10-29 ENCOUNTER — Telehealth: Payer: Self-pay | Admitting: *Deleted

## 2013-10-29 NOTE — Telephone Encounter (Signed)
Message copied by Thana Ates on Wed Oct 29, 2013 10:57 AM ------      Message from: Chevis Pretty      Created: Wed Oct 29, 2013  9:38 AM       All labs look great      Continue current meds- low fat diet and exercise and recheck in 3 months       ------

## 2013-11-25 ENCOUNTER — Telehealth: Payer: Self-pay | Admitting: Nurse Practitioner

## 2013-12-02 ENCOUNTER — Other Ambulatory Visit: Payer: Self-pay | Admitting: Nurse Practitioner

## 2013-12-02 ENCOUNTER — Telehealth: Payer: Self-pay | Admitting: Nurse Practitioner

## 2013-12-04 NOTE — Telephone Encounter (Signed)
Please call in xanax with 0 refills 

## 2013-12-04 NOTE — Telephone Encounter (Signed)
Last sen 10/27/13  MMM If approved route to nurse to call into [facility]  [phone]

## 2013-12-05 ENCOUNTER — Telehealth: Payer: Self-pay | Admitting: Nurse Practitioner

## 2013-12-05 ENCOUNTER — Other Ambulatory Visit: Payer: Self-pay | Admitting: Nurse Practitioner

## 2013-12-05 DIAGNOSIS — G8220 Paraplegia, unspecified: Secondary | ICD-10-CM

## 2013-12-05 NOTE — Telephone Encounter (Addendum)
Was approved yesterday to be called in- I did not deny refill- did call pharmacy and see if they have rx ready

## 2013-12-05 NOTE — Telephone Encounter (Signed)
Done yesterday- was not denied

## 2013-12-10 NOTE — Telephone Encounter (Signed)
Ordered chair from [facility], pt aware

## 2014-01-01 ENCOUNTER — Other Ambulatory Visit: Payer: Self-pay | Admitting: Nurse Practitioner

## 2014-01-02 NOTE — Telephone Encounter (Signed)
Last filled 12/05/13, last seen 10/27/13, pt uses [facility] [phone]

## 2014-01-02 NOTE — Telephone Encounter (Signed)
Please call in xanax with 1 refills 

## 2014-01-06 ENCOUNTER — Telehealth: Payer: Self-pay | Admitting: Nurse Practitioner

## 2014-01-06 NOTE — Telephone Encounter (Signed)
Called in.

## 2014-01-06 NOTE — Telephone Encounter (Signed)
Script for Xanax called to [facility] in [place].  Patient has no vm set up to leave message.

## 2014-01-31 ENCOUNTER — Other Ambulatory Visit: Payer: Self-pay | Admitting: Nurse Practitioner

## 2014-02-03 NOTE — Telephone Encounter (Signed)
Patient NTBS for follow up and lab work  

## 2014-02-03 NOTE — Telephone Encounter (Signed)
Patient last seen in office on 10-27-13. Rx last filled on 01-06-14. Please advise. If approved please route to pool B so nurse can phone in to pharmacy

## 2014-02-03 NOTE — Telephone Encounter (Signed)
Please call in xanax with 1 refills 

## 2014-02-03 NOTE — Telephone Encounter (Signed)
Called in.

## 2014-03-03 ENCOUNTER — Other Ambulatory Visit: Payer: Self-pay | Admitting: Nurse Practitioner

## 2014-03-04 NOTE — Telephone Encounter (Signed)
Please call in xanax with 1 refills 

## 2014-03-04 NOTE — Telephone Encounter (Signed)
Last seen 10/27/13, last filled 02/03/14. Call into [facility] [phone]

## 2014-03-04 NOTE — Telephone Encounter (Signed)
Called in.

## 2014-04-02 ENCOUNTER — Other Ambulatory Visit: Payer: Self-pay | Admitting: Nurse Practitioner

## 2014-04-03 NOTE — Telephone Encounter (Signed)
rx called in pharmacy. 

## 2014-04-03 NOTE — Telephone Encounter (Signed)
Please call in xanax with 1 refills 

## 2014-04-03 NOTE — Telephone Encounter (Signed)
Patient last seen in office on 10-27-13. Rx last filled on 03-04-14 for #60. Please advise. If approved please route to pool B so nurse can phone in to pharmacy

## 2014-04-27 ENCOUNTER — Encounter: Payer: Self-pay | Admitting: Physical Medicine & Rehabilitation

## 2014-04-27 ENCOUNTER — Encounter: Payer: Medicare Other | Attending: Physical Medicine & Rehabilitation

## 2014-04-27 ENCOUNTER — Ambulatory Visit (HOSPITAL_BASED_OUTPATIENT_CLINIC_OR_DEPARTMENT_OTHER): Payer: Medicare Other | Admitting: Physical Medicine & Rehabilitation

## 2014-04-27 VITALS — BP 122/85 | HR 80 | Resp 14

## 2014-04-27 DIAGNOSIS — G114 Hereditary spastic paraplegia: Secondary | ICD-10-CM | POA: Diagnosis not present

## 2014-04-27 DIAGNOSIS — M79609 Pain in unspecified limb: Secondary | ICD-10-CM

## 2014-04-27 DIAGNOSIS — M79643 Pain in unspecified hand: Secondary | ICD-10-CM

## 2014-04-27 MED ORDER — TIZANIDINE HCL 4 MG PO TABS: 2.0000 mg | ORAL_TABLET | Freq: Four times a day (QID) | ORAL | Status: DC | PRN

## 2014-04-27 NOTE — Progress Notes (Signed)
Subjective:    Patient ID: Tiffany Stewart, female    DOB: Sep 30, 1970, 43 y.o.   MRN: [id number]  HPI 43 year old female with hereditary spastic paraparesis  who was last seen in clinic 06/13/2012. Patient moved and lost her motorized wheelchair during the move. Brought a manual wheelchair for $10 that is in poor repair  Chief complaint is increasing spasms in both legs  Spasms occur about 3-4 times per week and last about 10 minutes. The limit her movement during that time. They can occur with either sitting or laying position  Spasms have been controlled with Zanaflex in the past but that's does not seem to be helping as much. Patient also takes baclofen when she has more severe spasms. 10 mg as needed  Also having increasing problems opening up pop bottles. Prior history of carpal tunnel syndrome has had injections in the past    Pain Inventory Average Pain 8 Pain Right Now 8 My pain is sharp, burning, dull, stabbing, tingling and aching  In the last 24 hours, has pain interfered with the following? General activity 5 Relation with others 5 Enjoyment of life 5 What TIME of day is your pain at its worst? all Sleep (in general) Fair  Pain is worse with: some activites Pain improves with: medication Relief from Meds: 0  Mobility use a walker use a wheelchair  Function disabled: date disabled .  Neuro/Psych bladder control problems bowel control problems numbness tremor tingling spasms dizziness  Prior Studies Any changes since last visit?  no  Physicians involved in your care Any changes since last visit?  no   Family History  Problem Relation Age of Onset  . Diabetes Father   . Cancer Father   . Heart disease Father   . Heart disease Maternal Grandmother   . Stroke Maternal Grandfather   . Diabetes Paternal Grandmother    History   Social History  . Marital Status: Single    Spouse Name: N/A    Number of Children: N/A  . Years of Education: N/A    Social History Main Topics  . Smoking status: Current Every Day Smoker -- 1.00 packs/day for 27 years    Types: Cigarettes  . Smokeless tobacco: Never Used  . Alcohol Use: No  . Drug Use: No  . Sexual Activity: None   Other Topics Concern  . None   Social History Narrative  . None   Past Surgical History  Procedure Laterality Date  . Cholecystectomy    . Cesarean section     Past Medical History  Diagnosis Date  . GERD (gastroesophageal reflux disease)   . Migraines    BP 122/85  Pulse 80  Resp 14  SpO2 98%  Opioid Risk Score:   Fall Risk Score: Moderate Fall Risk (6-13 points) Review of Systems  Gastrointestinal: Positive for vomiting, abdominal pain and diarrhea.       Bowel Control Problems  Genitourinary:       Bladder control problems  Musculoskeletal:       Spasms and cramps  Neurological: Positive for dizziness and tremors.       Tingling  All other systems reviewed and are negative.      Objective:   Physical Exam  Nursing note and vitals reviewed. Constitutional: She is oriented to person, place, and time. She appears well-developed and well-nourished.  Neurological: She is alert and oriented to person, place, and time. She displays no atrophy. A sensory deficit is present. She exhibits  abnormal muscle tone. Gait normal.  Reflex Scores:      Tricep reflexes are 3+ on the right side and 3+ on the left side.      Bicep reflexes are 3+ on the right side and 3+ on the left side.      Brachioradialis reflexes are 3+ on the right side and 3+ on the left side.      Patellar reflexes are 4+ on the right side and 4+ on the left side.      Achilles reflexes are 4+ on the right side and 4+ on the left side. 5-/5 B Delt bi tri grip 44minus bilateral knee extensors and hip flexors, ankle plantarflexion and dorsiflexion inhibited by severe tone.  Sensation reduced to pinprick bilateral lower limbs Intact in the upper limbs   Psychiatric: She has a normal  mood and affect.    Equinovarus spasticity in positioning bilateral Ashworth grade 3 spasticity in the quadriceps and hamstrings bilaterally  Kyphotic posture in wheelchair. Has sling back with armrests that are falling off.     Assessment & Plan:  1. Hereditary spastic paresthesia with increasing tone. Recommend increased Zanaflex 4 mg 4 times a day Patient also needs a wheelchair evaluation Stretching program provided by PT Also try to increase standing tolerance.  Return to clinic 6 weeks consider increasing baclofen  2.  Hand pain with history of CTS repeat EMG next visit  Over half of the 25 min visit was spent counseling and coordinating care.

## 2014-04-27 NOTE — Patient Instructions (Addendum)
PT at [facility] will call you to schedule PT

## 2014-05-05 ENCOUNTER — Other Ambulatory Visit: Payer: Self-pay | Admitting: Nurse Practitioner

## 2014-05-06 NOTE — Telephone Encounter (Signed)
Last seen 10/27/13  MMM If approved route to nurse to call into [facility]  [phone]

## 2014-05-06 NOTE — Telephone Encounter (Signed)
Please call in xanax with 0 refills no more refills without being seen  

## 2014-05-07 ENCOUNTER — Telehealth: Payer: Self-pay | Admitting: Nurse Practitioner

## 2014-05-07 MED ORDER — ALPRAZOLAM 0.25 MG PO TABS: ORAL_TABLET | ORAL | Status: DC

## 2014-05-07 NOTE — Telephone Encounter (Signed)
Called in patient aware.  

## 2014-05-07 NOTE — Telephone Encounter (Signed)
Called in.

## 2014-05-07 NOTE — Telephone Encounter (Signed)
Please call in xanax 0.25 1 po BID #[id number]  with 0 refills 

## 2014-05-08 ENCOUNTER — Ambulatory Visit (HOSPITAL_COMMUNITY): Payer: Medicare Other | Admitting: Physical Therapy

## 2014-05-21 ENCOUNTER — Ambulatory Visit (HOSPITAL_COMMUNITY)
Admission: RE | Admit: 2014-05-21 | Discharge: 2014-05-21 | Disposition: A | Discharge status: Home / Self Care | Payer: Medicare Other | Source: Ambulatory Visit | Attending: Physical Medicine & Rehabilitation | Admitting: Physical Medicine & Rehabilitation

## 2014-05-21 DIAGNOSIS — Z5189 Encounter for other specified aftercare: Secondary | ICD-10-CM | POA: Insufficient documentation

## 2014-05-21 DIAGNOSIS — M6281 Muscle weakness (generalized): Secondary | ICD-10-CM | POA: Diagnosis not present

## 2014-05-21 DIAGNOSIS — G114 Hereditary spastic paraplegia: Secondary | ICD-10-CM | POA: Diagnosis not present

## 2014-05-21 DIAGNOSIS — R252 Cramp and spasm: Secondary | ICD-10-CM | POA: Diagnosis not present

## 2014-05-21 NOTE — Evaluation (Signed)
Physical Therapy Evaluation  Patient Details  Name: Tiffany Stewart MRN: [id number] Date of Birth: Jun 01, 1971  Today's Date: 05/21/2014 Time: 1497-0263 PT Time Calculation (min): 45 min    Charges: 1 Evaluation, Self care [phone], TE [phone]          Visit#: 1 of 1  Assessment Diagnosis: muscle cramps secondary to spastic paraplegia.  Next MD Visit: Alysia Penna Prior Therapy: yes- helpful  Authorization: Medicare/Medicaid     Past Medical History:  Past Medical History  Diagnosis Date  . GERD (gastroesophageal reflux disease)   . Migraines    Past Surgical History:  Past Surgical History  Procedure Laterality Date  . Cholecystectomy    . Cesarean section      Subjective Symptoms/Limitations Symptoms: Cramps have been decreased since beginning xenodene. patient desires to have a strong HEP and only wants a few visitas due to difficulty attending and rivign to therapy.  Pertinent History: Spastic paraplegia, primary complaint of increased cramps, patient performs most ADL's and IADLs independently. primary complain of cramps in back and calves. At age 63 patient was no longer able to walk.  Limitations: Standing How long can you stand comfortably?: cramps with prolonged standing > 69minutes Patient Stated Goals: to decrease cramping.  Pain Assessment Currently in Pain?: No/denies Pain Score: 0-No pain  Precautions/Restrictions  Precautions Precaution Comments: limited balance sedondary to weakness in bilateral LE.   Sensation/Coordination/Flexibility/Functional Tests Flexibility Thomas: Positive Obers: Positive 90/90: Positive Functional Tests Functional Tests: Ely's test: positive Functional Tests: Limited gastroc and soleus muscle mobility  Assessment LLE AROM (degrees) LLE Overall AROM Comments: overall WNL unless otherwise stated, measurements the same bilaterally Left Hip Extension: 0 Left Hip Flexion: 90 Left Hip External Rotation : 20 Left Hip  Internal Rotation : 30 Left Ankle Dorsiflexion: -5 Left Ankle Plantar Flexion: 30 Left Ankle Inversion: 10 Left Ankle Eversion: 5 LLE Strength Left Hip Flexion: 2-/5 Left Hip Extension: 2-/5 Left Hip ABduction: 0/5 Left Hip ADduction: 2-/5 Left Knee Flexion: 0/5 Left Knee Extension: 0/5 Left Ankle Dorsiflexion: 0/5 Left Ankle Plantar Flexion: 0/5 Left Ankle Inversion: 0/5 Left Ankle Eversion: 0/5  Exercise/Treatments Stretches Active Hamstring Stretch: 4 reps;20 seconds;Limitations Active Hamstring Stretch Limitations: sitting, standing, supine Hip Flexor Stretch: 4 reps;20 seconds Gastroc Stretch: 4 reps;20 seconds;Limitations Gastroc Stretch Limitations: sitting, standing, supine  Physical Therapy Assessment and Plan PT Assessment and Plan Clinical Impression Statement: Patient displays bilateral LE stiffness in ankles , knees and hips secondary to muscle cramping in feet, calfs, hamstrings, and hip flexors resulting in difficulty standing. Patient has spastic paraplegia resultign in weakness in bilateral LE that had been previosly treated with minimal results. Session focused on education of patient on HEP to be performed independently with stretching exercises targeting feet, calfs, hamstrings, and hip flexors that can be performed in standing, sitting and supine. Patient demosntrates good performance of exercises and requires no further education for HEP performance at end of session.  PT Treatment/Interventions: Therapeutic exercise;Patient/family education PT Plan: Patient discharged with HEP: supine/sitting/standing calf,  foot, hamstring, and hip flexors stretches.     Problem List Patient Active Problem List   Diagnosis Date Noted  . HSP (hereditary spastic paraplegia) 04/27/2014  . DIARRHEA OF PRESUMED INFECTIOUS ORIGIN 11/11/2009  . TOBACCO ABUSE 11/11/2009  . PARAPLEGIA 11/11/2009  . ABDOMINAL PAIN, CHRONIC 11/11/2009  . ABDOMINAL PAIN-MULTIPLE SITES 11/11/2009  .  NONSPECIFIC ABN FINDING RAD & OTH EXAM GI TRACT 11/11/2009  . ABDOMINAL PAIN-RLQ 03/10/2008  . ANXIETY 03/06/2008  . GERD  03/06/2008    PT - End of Session Activity Tolerance: Patient tolerated treatment well General Behavior During Therapy: WFL for tasks assessed/performed PT Plan of Care PT Home Exercise Plan: Supine/sitting/standing calf,  foot, hamstring, and hip flexors stretches.  PT Patient Instructions: to be performed twice daily  GP Functional Assessment Tool Used: FOTO 59% limited Functional Limitation: Mobility: Walking and moving around Mobility: Walking and Moving Around Current Status (S8270): At least 40 percent but less than 60 percent impaired, limited or restricted Mobility: Walking and Moving Around Goal Status [phone]): At least 40 percent but less than 60 percent impaired, limited or restricted Mobility: Walking and Moving Around Discharge Status [phone]): At least 40 percent but less than 60 percent impaired, limited or restricted  Leia Alf 05/21/2014, 10:50 AM  Physician Documentation Your signature is required to indicate approval of the treatment plan as stated above.  Please sign and either send electronically or make a copy of this report for your files and return this physician signed original.   Please mark one 1.__approve of plan  2. ___approve of plan with the following conditions.   ______________________________                                                          _____________________ Physician Signature                                                                                                             Date

## 2014-05-22 ENCOUNTER — Telehealth: Payer: Self-pay | Admitting: *Deleted

## 2014-05-22 ENCOUNTER — Ambulatory Visit (INDEPENDENT_AMBULATORY_CARE_PROVIDER_SITE_OTHER): Payer: Medicare Other | Admitting: Nurse Practitioner

## 2014-05-22 ENCOUNTER — Encounter: Payer: Self-pay | Admitting: Nurse Practitioner

## 2014-05-22 VITALS — BP 118/82 | HR 79 | Temp 98.0°F

## 2014-05-22 DIAGNOSIS — K219 Gastro-esophageal reflux disease without esophagitis: Secondary | ICD-10-CM | POA: Diagnosis not present

## 2014-05-22 DIAGNOSIS — I10 Essential (primary) hypertension: Secondary | ICD-10-CM

## 2014-05-22 DIAGNOSIS — F329 Major depressive disorder, single episode, unspecified: Secondary | ICD-10-CM

## 2014-05-22 DIAGNOSIS — F32A Depression, unspecified: Secondary | ICD-10-CM

## 2014-05-22 DIAGNOSIS — K2100 Gastro-esophageal reflux disease with esophagitis, without bleeding: Secondary | ICD-10-CM

## 2014-05-22 DIAGNOSIS — J069 Acute upper respiratory infection, unspecified: Secondary | ICD-10-CM

## 2014-05-22 DIAGNOSIS — Z720 Tobacco use: Secondary | ICD-10-CM

## 2014-05-22 DIAGNOSIS — K589 Irritable bowel syndrome without diarrhea: Secondary | ICD-10-CM

## 2014-05-22 DIAGNOSIS — G114 Hereditary spastic paraplegia: Secondary | ICD-10-CM

## 2014-05-22 DIAGNOSIS — F17200 Nicotine dependence, unspecified, uncomplicated: Secondary | ICD-10-CM

## 2014-05-22 DIAGNOSIS — G8220 Paraplegia, unspecified: Secondary | ICD-10-CM | POA: Diagnosis not present

## 2014-05-22 DIAGNOSIS — F411 Generalized anxiety disorder: Secondary | ICD-10-CM

## 2014-05-22 MED ORDER — LISINOPRIL 10 MG PO TABS: 10.0000 mg | ORAL_TABLET | Freq: Every day | ORAL | Status: DC

## 2014-05-22 MED ORDER — AZITHROMYCIN 250 MG PO TABS
ORAL_TABLET | ORAL | Status: DC
Start: 2014-05-22 — End: 2014-11-19

## 2014-05-22 MED ORDER — ESOMEPRAZOLE MAGNESIUM 40 MG PO CPDR: 40.0000 mg | DELAYED_RELEASE_CAPSULE | Freq: Two times a day (BID) | ORAL | Status: DC

## 2014-05-22 MED ORDER — CITALOPRAM HYDROBROMIDE 20 MG PO TABS: 20.0000 mg | ORAL_TABLET | Freq: Every day | ORAL | Status: DC

## 2014-05-22 MED ORDER — BACLOFEN 10 MG PO TABS: ORAL_TABLET | ORAL | Status: DC

## 2014-05-22 MED ORDER — DICYCLOMINE HCL 20 MG PO TABS: 20.0000 mg | ORAL_TABLET | Freq: Four times a day (QID) | ORAL | Status: DC

## 2014-05-22 NOTE — Addendum Note (Signed)
Addended by: Pollyann Kennedy F on: 05/22/2014 05:25 PM   Modules accepted: Orders

## 2014-05-22 NOTE — Telephone Encounter (Signed)
Called Tiffany Stewart to come back to have labs drawn. Shakala didn't come get labs at her apt 05-22-14

## 2014-05-22 NOTE — Progress Notes (Signed)
Subjective:    Patient ID: Tiffany Stewart, female    DOB: 13-Sep-1970, 43 y.o.   MRN: [id number]  Patient here today for follow up of chronic medical problems. Only complaint is cough and congestion. Started several days ago- no fever   Hypertension This is a chronic problem. The current episode started more than 1 year ago. The problem is unchanged. The problem is controlled. Pertinent negatives include no sweats. There are no associated agents to hypertension. Risk factors for coronary artery disease include sedentary lifestyle. Past treatments include ACE inhibitors. The current treatment provides significant improvement. Compliance problems include diet and exercise.   gerd Patient on nexium- has only been taking 1X a day and ordered BID- Says that she is having some heartburn 3-4 X a week Depression and GAD Patient on celexa and xanax- doing well on combination- Since her last visit she has moved into her own place and left her husband- says has really decreased her stress level. Paraplegia Takes Tizandine to control muscle spasms- helps a lot - has occassional muscle cramps   Review of Systems  Constitutional: Positive for fatigue.  HENT: Positive for congestion, rhinorrhea and sinus pressure. Negative for sore throat, trouble swallowing and voice change.   Respiratory: Positive for cough (deep productive cough).   Cardiovascular: Negative.   Gastrointestinal: Negative.   Genitourinary: Negative.   Musculoskeletal: Negative.   Neurological: Negative.   Psychiatric/Behavioral: Negative.        Objective:   Physical Exam  Constitutional: She is oriented to person, place, and time. She appears well-developed.  HENT:  Right Ear: External ear normal.  Left Ear: External ear normal.  Nose: Mucosal edema and rhinorrhea present. Right sinus exhibits no maxillary sinus tenderness and no frontal sinus tenderness. Left sinus exhibits no maxillary sinus tenderness and no frontal sinus  tenderness.  Mouth/Throat: Uvula is midline, oropharynx is clear and moist and mucous membranes are normal.  Eyes: Pupils are equal, round, and reactive to light.  Neck: Normal range of motion. Neck supple.  Cardiovascular: Normal rate, regular rhythm and normal heart sounds.   Pulmonary/Chest: Effort normal and breath sounds normal. No respiratory distress. She has no wheezes. She has no rales.  Deep wet cough  Abdominal: Soft. Bowel sounds are normal.  Musculoskeletal:  Both feet are turned inward  Neurological: She is alert and oriented to person, place, and time.  Skin: Skin is warm and dry.  Psychiatric: She has a normal mood and affect. Her behavior is normal. Judgment and thought content normal.   BP 118/82  Pulse 79  Temp(Src) 98 F (36.7 C) (Oral)        Assessment & Plan:     1. TOBACCO ABUSE STOP SMOKING!!!  2. Paraplegia  3. HSP (hereditary spastic paraplegia)  4. Gastroesophageal reflux disease without esophagitis  5. Anxiety state  6. Upper respiratory infection with cough and congestion 1. Take meds as prescribed 2. Use a cool mist humidifier especially during the winter months and when heat has been humid. 3. Use saline nose sprays frequently 4. Saline irrigations of the nose can be very helpful if done frequently.  * 4X daily for 1 week*  * Use of a nettie pot can be helpful with this. Follow directions with this* 5. Drink plenty of fluids 6. Keep thermostat turn down low 7.For any cough or congestion  Use plain Mucinex- regular strength or max strength is fine   * Children- consult with Pharmacist for dosing 8. For fever  or aces or pains- take tylenol or ibuprofen appropriate for age and weight.  * for fevers greater than 101 orally you may alternate ibuprofen and tylenol every  3 hours.   - azithromycin (ZITHROMAX Z-PAK) 250 MG tablet; As directed  Dispense: 6 each; Refill: 0  7. Depression - citalopram (CELEXA) 20 MG tablet; Take 1 tablet  (20 mg total) by mouth daily.  Dispense: 30 tablet; Refill: 5  8. Essential hypertension, benign - lisinopril (PRINIVIL,ZESTRIL) 10 MG tablet; Take 1 tablet (10 mg total) by mouth daily.  Dispense: 30 tablet; Refill: 5 - CMP14+EGFR - NMR, lipoprofile  9. Gastroesophageal reflux disease with esophagitis - esomeprazole (NEXIUM) 40 MG capsule; Take 1 capsule (40 mg total) by mouth 2 (two) times daily.  Dispense: 60 capsule; Refill: 5  10. IBS (irritable bowel syndrome) - dicyclomine (BENTYL) 20 MG tablet; Take 1 tablet (20 mg total) by mouth every 6 (six) hours.  Dispense: 60 tablet; Refill: 5 - baclofen (LIORESAL) 10 MG tablet; TAKE 1 TABLET BY MOUTH TWICE DAILY  Dispense: 60 tablet; Refill: 5    Labs pending Health maintenance reviewed Diet and exercise encouraged Continue all meds Follow up  In 3 months   [place], FNP

## 2014-05-22 NOTE — Patient Instructions (Signed)
Upper Respiratory Infection, Adult An upper respiratory infection (URI) is also known as the common cold. It is often caused by a type of germ (virus). Colds are easily spread (contagious). You can pass it to others by kissing, coughing, sneezing, or drinking out of the same glass. Usually, you get better in 1 or 2 weeks.  HOME CARE   Only take medicine as told by your doctor.  Use a warm mist humidifier or breathe in steam from a hot shower.  Drink enough water and fluids to keep your pee (urine) clear or pale yellow.  Get plenty of rest.  Return to work when your temperature is back to normal or as told by your doctor. You may use a face mask and wash your hands to stop your cold from spreading. GET HELP RIGHT AWAY IF:   After the first few days, you feel you are getting worse.  You have questions about your medicine.  You have chills, shortness of breath, or Moller or red spit (mucus).  You have yellow or Fernicola snot (nasal discharge) or pain in the face, especially when you bend forward.  You have a fever, puffy (swollen) neck, pain when you swallow, or white spots in the back of your throat.  You have a bad headache, ear pain, sinus pain, or chest pain.  You have a high-pitched whistling sound when you breathe in and out (wheezing).  You have a lasting cough or cough up blood.  You have sore muscles or a stiff neck. MAKE SURE YOU:   Understand these instructions.  Will watch your condition.  Will get help right away if you are not doing well or get worse. Document Released: 01/17/2008 Document Revised: 10/23/2011 Document Reviewed: 11/05/2013 Exit[employer] Patient Information 2015 [employer]. This information is not intended to replace advice given to you by your health care provider. Make sure you discuss any questions you have with your health care provider.  

## 2014-05-22 NOTE — Addendum Note (Signed)
Addended by: Pollyann Kennedy F on: 05/22/2014 05:24 PM   Modules accepted: Orders

## 2014-05-28 ENCOUNTER — Telehealth: Payer: Self-pay | Admitting: Nurse Practitioner

## 2014-05-28 ENCOUNTER — Other Ambulatory Visit (INDEPENDENT_AMBULATORY_CARE_PROVIDER_SITE_OTHER): Payer: Medicare Other

## 2014-05-28 DIAGNOSIS — J069 Acute upper respiratory infection, unspecified: Secondary | ICD-10-CM | POA: Diagnosis not present

## 2014-05-28 DIAGNOSIS — K589 Irritable bowel syndrome without diarrhea: Secondary | ICD-10-CM

## 2014-05-28 DIAGNOSIS — Z720 Tobacco use: Secondary | ICD-10-CM | POA: Diagnosis not present

## 2014-05-28 DIAGNOSIS — G8220 Paraplegia, unspecified: Secondary | ICD-10-CM | POA: Diagnosis not present

## 2014-05-28 DIAGNOSIS — F329 Major depressive disorder, single episode, unspecified: Secondary | ICD-10-CM

## 2014-05-28 DIAGNOSIS — F17200 Nicotine dependence, unspecified, uncomplicated: Secondary | ICD-10-CM | POA: Diagnosis not present

## 2014-05-28 DIAGNOSIS — I10 Essential (primary) hypertension: Secondary | ICD-10-CM | POA: Diagnosis not present

## 2014-05-28 DIAGNOSIS — G114 Hereditary spastic paraplegia: Secondary | ICD-10-CM | POA: Diagnosis not present

## 2014-05-28 DIAGNOSIS — K2100 Gastro-esophageal reflux disease with esophagitis, without bleeding: Secondary | ICD-10-CM

## 2014-05-28 DIAGNOSIS — K219 Gastro-esophageal reflux disease without esophagitis: Secondary | ICD-10-CM

## 2014-05-28 DIAGNOSIS — F419 Anxiety disorder, unspecified: Secondary | ICD-10-CM | POA: Diagnosis not present

## 2014-05-28 DIAGNOSIS — F411 Generalized anxiety disorder: Secondary | ICD-10-CM

## 2014-05-28 DIAGNOSIS — F32A Depression, unspecified: Secondary | ICD-10-CM

## 2014-05-28 NOTE — Progress Notes (Signed)
Lab only 

## 2014-05-28 NOTE — Telephone Encounter (Signed)
Not due fro xanax refill until 06/06/13- had refill on 05/07/14

## 2014-05-29 LAB — CMP14+EGFR
ALT: 7 IU/L (ref 0–32)
AST: 9 IU/L (ref 0–40)
Albumin/Globulin Ratio: 1.7 (ref 1.1–2.5)
Albumin: 4.1 g/dL (ref 3.5–5.5)
Alkaline Phosphatase: 58 IU/L (ref 39–117)
BUN/Creatinine Ratio: 25 — ABNORMAL HIGH (ref 9–23)
BUN: 16 mg/dL (ref 6–24)
CALCIUM: 8.9 mg/dL (ref 8.7–10.2)
CHLORIDE: 102 mmol/L (ref 97–108)
CO2: 20 mmol/L (ref 18–29)
Creatinine, Ser: 0.63 mg/dL (ref 0.57–1.00)
GFR calc Af Amer: 129 mL/min/{1.73_m2} (ref >59)
GFR calc non Af Amer: 112 mL/min/{1.73_m2} (ref >59)
GLUCOSE: 88 mg/dL (ref 65–99)
Globulin, Total: 2.4 g/dL (ref 1.5–4.5)
POTASSIUM: 3.9 mmol/L (ref 3.5–5.2)
Sodium: 140 mmol/L (ref 134–144)
TOTAL PROTEIN: 6.5 g/dL (ref 6.0–8.5)
Total Bilirubin: <0.2 mg/dL (ref 0.0–1.2)

## 2014-05-29 LAB — NMR, LIPOPROFILE
Cholesterol: 139 mg/dL (ref 100–199)
HDL CHOLESTEROL BY NMR: 36 mg/dL — AB (ref >39)
HDL Particle Number: 25 umol/L — ABNORMAL LOW (ref >=30.5)
LDL Particle Number: 869 nmol/L (ref <1000)
LDL SIZE: 21.9 nm (ref >20.5)
LDLC SERPL CALC-MCNC: 84 mg/dL (ref 0–99)
LP-IR Score: 52 — ABNORMAL HIGH (ref <=45)
Small LDL Particle Number: 195 nmol/L (ref <=527)
TRIGLYCERIDES BY NMR: 97 mg/dL (ref 0–149)

## 2014-06-02 ENCOUNTER — Other Ambulatory Visit: Payer: Self-pay | Admitting: Nurse Practitioner

## 2014-06-04 NOTE — Telephone Encounter (Signed)
Phoned in to pharm 

## 2014-06-04 NOTE — Telephone Encounter (Signed)
Please call in xanax with 0 refills 

## 2014-06-04 NOTE — Telephone Encounter (Signed)
Patient last seen in office on 05-22-14. Rx last filled on 05-07-14 for #60. Please advise

## 2014-06-08 ENCOUNTER — Ambulatory Visit: Payer: Medicare Other | Admitting: Physical Medicine & Rehabilitation

## 2014-06-26 ENCOUNTER — Ambulatory Visit: Payer: Medicare Other

## 2014-06-26 ENCOUNTER — Ambulatory Visit: Payer: Medicare Other | Admitting: Physical Medicine & Rehabilitation

## 2014-07-04 ENCOUNTER — Other Ambulatory Visit: Payer: Self-pay | Admitting: Nurse Practitioner

## 2014-07-06 ENCOUNTER — Other Ambulatory Visit: Payer: Self-pay | Admitting: Nurse Practitioner

## 2014-07-06 NOTE — Telephone Encounter (Signed)
Called in xanax per MMM.

## 2014-07-06 NOTE — Telephone Encounter (Signed)
Last filled 06/04/14, last seen 05/22/14 Pt uses [facility] [phone]

## 2014-07-06 NOTE — Telephone Encounter (Signed)
Please call in xanax with 0 refills 

## 2014-07-06 NOTE — Telephone Encounter (Signed)
Script for xanax called to [facility].   Pt aware.

## 2014-07-27 ENCOUNTER — Encounter: Payer: Self-pay | Admitting: Physical Medicine & Rehabilitation

## 2014-07-27 ENCOUNTER — Ambulatory Visit (HOSPITAL_BASED_OUTPATIENT_CLINIC_OR_DEPARTMENT_OTHER): Payer: Medicare Other | Admitting: Physical Medicine & Rehabilitation

## 2014-07-27 ENCOUNTER — Encounter: Payer: Medicare Other | Attending: Physical Medicine & Rehabilitation

## 2014-07-27 VITALS — BP 128/89 | HR 71 | Resp 14

## 2014-07-27 DIAGNOSIS — G5611 Other lesions of median nerve, right upper limb: Secondary | ICD-10-CM | POA: Diagnosis not present

## 2014-07-27 DIAGNOSIS — G5612 Other lesions of median nerve, left upper limb: Secondary | ICD-10-CM | POA: Insufficient documentation

## 2014-07-27 DIAGNOSIS — G114 Hereditary spastic paraplegia: Secondary | ICD-10-CM | POA: Insufficient documentation

## 2014-07-27 DIAGNOSIS — M79643 Pain in unspecified hand: Secondary | ICD-10-CM

## 2014-07-27 NOTE — Progress Notes (Signed)
Please see scanned copy of EMG for full report  Right median neuropathy at the wrist rated as mild affecting sensory fibers only Left median neuropathy at the wrist rated as moderate affecting both sensory and motor fibers  Needle EMG was normal in the APB and first DI bilaterally  Absent ulnar sensory responses but normal ulnar motor. There was some difficulty with motor artifact on the sensory exam question whether this is clinically significant

## 2014-07-27 NOTE — Patient Instructions (Signed)
Order sent to [facility] for OT, to work on nerve glide exercise and splinting

## 2014-08-11 ENCOUNTER — Ambulatory Visit (HOSPITAL_COMMUNITY): Payer: Medicare Other

## 2014-08-17 ENCOUNTER — Ambulatory Visit (HOSPITAL_COMMUNITY): Payer: Medicare Other | Admitting: Specialist

## 2014-08-24 ENCOUNTER — Ambulatory Visit (HOSPITAL_COMMUNITY): Payer: Medicare Other

## 2014-08-28 ENCOUNTER — Ambulatory Visit: Payer: Medicare Other | Admitting: Physical Medicine & Rehabilitation

## 2014-08-28 ENCOUNTER — Encounter: Payer: Medicare Other | Attending: Physical Medicine & Rehabilitation

## 2014-08-28 DIAGNOSIS — G5611 Other lesions of median nerve, right upper limb: Secondary | ICD-10-CM | POA: Insufficient documentation

## 2014-08-28 DIAGNOSIS — G114 Hereditary spastic paraplegia: Secondary | ICD-10-CM | POA: Insufficient documentation

## 2014-08-28 DIAGNOSIS — G5612 Other lesions of median nerve, left upper limb: Secondary | ICD-10-CM | POA: Insufficient documentation

## 2014-08-30 ENCOUNTER — Other Ambulatory Visit: Payer: Self-pay | Admitting: Nurse Practitioner

## 2014-08-31 NOTE — Telephone Encounter (Signed)
rx called into pharmacy

## 2014-08-31 NOTE — Telephone Encounter (Signed)
Please call in xanax with 1 refills 

## 2014-08-31 NOTE — Telephone Encounter (Signed)
Last filled 07/06/14. If approved route to nurse to call in to [place]

## 2014-09-10 ENCOUNTER — Telehealth: Payer: Self-pay | Admitting: Nurse Practitioner

## 2014-09-11 ENCOUNTER — Ambulatory Visit (HOSPITAL_COMMUNITY): Payer: Medicare Other

## 2014-09-21 NOTE — Telephone Encounter (Signed)
Unable to reach at number provided  

## 2014-10-04 ENCOUNTER — Other Ambulatory Visit: Payer: Self-pay | Admitting: Nurse Practitioner

## 2014-10-05 NOTE — Telephone Encounter (Signed)
Last filled 08/31/14, last seen 05/22/14. Call into [facility] [phone]

## 2014-10-05 NOTE — Telephone Encounter (Signed)
Please call in xanax with 0 refills no more refills without being seen  

## 2014-10-06 ENCOUNTER — Telehealth: Payer: Self-pay | Admitting: Nurse Practitioner

## 2014-10-06 NOTE — Telephone Encounter (Signed)
rx called into pharmacy

## 2014-10-30 ENCOUNTER — Ambulatory Visit: Payer: Medicare Other | Admitting: Nurse Practitioner

## 2014-11-04 ENCOUNTER — Ambulatory Visit: Payer: Medicare Other | Admitting: Nurse Practitioner

## 2014-11-19 ENCOUNTER — Ambulatory Visit (INDEPENDENT_AMBULATORY_CARE_PROVIDER_SITE_OTHER): Payer: Medicare Other | Admitting: Nurse Practitioner

## 2014-11-19 ENCOUNTER — Encounter: Payer: Self-pay | Admitting: Nurse Practitioner

## 2014-11-19 DIAGNOSIS — K589 Irritable bowel syndrome without diarrhea: Secondary | ICD-10-CM | POA: Diagnosis not present

## 2014-11-19 DIAGNOSIS — G8220 Paraplegia, unspecified: Secondary | ICD-10-CM | POA: Diagnosis not present

## 2014-11-19 DIAGNOSIS — Z720 Tobacco use: Secondary | ICD-10-CM | POA: Diagnosis not present

## 2014-11-19 DIAGNOSIS — F411 Generalized anxiety disorder: Secondary | ICD-10-CM

## 2014-11-19 DIAGNOSIS — K2100 Gastro-esophageal reflux disease with esophagitis, without bleeding: Secondary | ICD-10-CM

## 2014-11-19 DIAGNOSIS — F17200 Nicotine dependence, unspecified, uncomplicated: Secondary | ICD-10-CM

## 2014-11-19 DIAGNOSIS — I10 Essential (primary) hypertension: Secondary | ICD-10-CM

## 2014-11-19 DIAGNOSIS — G114 Hereditary spastic paraplegia: Secondary | ICD-10-CM

## 2014-11-19 MED ORDER — TIZANIDINE HCL 4 MG PO TABS: 2.0000 mg | ORAL_TABLET | Freq: Four times a day (QID) | ORAL | Status: DC | PRN

## 2014-11-19 MED ORDER — DICYCLOMINE HCL 20 MG PO TABS: 20.0000 mg | ORAL_TABLET | Freq: Four times a day (QID) | ORAL | Status: DC

## 2014-11-19 MED ORDER — ESOMEPRAZOLE MAGNESIUM 40 MG PO CPDR: 40.0000 mg | DELAYED_RELEASE_CAPSULE | Freq: Two times a day (BID) | ORAL | Status: DC

## 2014-11-19 MED ORDER — BACLOFEN 10 MG PO TABS: ORAL_TABLET | ORAL | Status: DC

## 2014-11-19 MED ORDER — LISINOPRIL 10 MG PO TABS: 10.0000 mg | ORAL_TABLET | Freq: Every day | ORAL | Status: DC

## 2014-11-19 MED ORDER — ALPRAZOLAM 0.25 MG PO TABS: 0.2500 mg | ORAL_TABLET | Freq: Two times a day (BID) | ORAL | Status: DC

## 2014-11-19 NOTE — Patient Instructions (Signed)

## 2014-11-19 NOTE — Progress Notes (Addendum)
Subjective:    Patient ID: Tiffany Stewart, female    DOB: 1970/08/31, 44 y.o.   MRN: [id number]  Patient here today for follow up of chronic medical problems. Only complaint is cough and congestion. Started several days ago- no fever   * Patient wheelchair is worn out- seat cushion is flat and wheels are hard to turn- patient needs new chair Hypertension This is a chronic problem. The current episode started more than 1 year ago. The problem is unchanged. The problem is controlled. Pertinent negatives include no chest pain, headaches, palpitations, shortness of breath or sweats. There are no associated agents to hypertension. Risk factors for coronary artery disease include dyslipidemia, post-menopausal state, sedentary lifestyle and smoking/tobacco exposure. Past treatments include ACE inhibitors. The current treatment provides moderate improvement. Compliance problems include diet and exercise.   gerd Patient on nexium- has only been taking 1X a day and ordered BID- Says that she is having some heartburn 3-4 X a week Depression and GAD Patient on celexa and xanax- doing well on combination- Since her last visit she has moved into her own place and left her husband- says has really decreased her stress level. Spastic araplegia Takes Tizandine to control muscle spasms- helps a lot - has occassional muscle cramps- patient is wheel chair confined and her wheel chair is old- the brakes will not lock wheelchair and arm rest is falling off. Cannot [erform activities of daily living (bathing,grooming and dressing) She is unable to use a walker. A wheel chair will allow her to safely perform daily acivities. Patient can safely propel the wheelchair in her home.   Review of Systems  Constitutional: Positive for fatigue.  HENT: Positive for congestion, rhinorrhea and sinus pressure. Negative for sore throat, trouble swallowing and voice change.   Respiratory: Positive for cough (deep productive cough).  Negative for shortness of breath.   Cardiovascular: Negative.  Negative for chest pain and palpitations.  Gastrointestinal: Negative.   Genitourinary: Negative.   Musculoskeletal: Negative.   Neurological: Negative.  Negative for headaches.  Psychiatric/Behavioral: Negative.        Objective:   Physical Exam  Constitutional: She is oriented to person, place, and time. She appears well-developed.  HENT:  Right Ear: External ear normal.  Left Ear: External ear normal.  Nose: Mucosal edema and rhinorrhea present. Right sinus exhibits no maxillary sinus tenderness and no frontal sinus tenderness. Left sinus exhibits no maxillary sinus tenderness and no frontal sinus tenderness.  Mouth/Throat: Uvula is midline, oropharynx is clear and moist and mucous membranes are normal.  Eyes: Pupils are equal, round, and reactive to light.  Neck: Normal range of motion. Neck supple.  Cardiovascular: Normal rate, regular rhythm and normal heart sounds.   Pulmonary/Chest: Effort normal and breath sounds normal. No respiratory distress. She has no wheezes. She has no rales.  Deep wet cough  Abdominal: Soft. Bowel sounds are normal.  Musculoskeletal:  Both feet are turned inward  Neurological: She is alert and oriented to person, place, and time.  Skin: Skin is warm and dry.  Psychiatric: She has a normal mood and affect. Her behavior is normal. Judgment and thought content normal.   BP 137/90 mmHg  Pulse 92  Temp(Src) 97.5 F (36.4 C) (Oral)  Wt         Assessment & Plan:   1. TOBACCO ABUSE Smoking cessation encouraged  2. HSP (hereditary spastic paraplegia) Needs new wheel chair - tiZANidine (ZANAFLEX) 4 MG tablet; Take 0.5 tablets (2 mg  total) by mouth every 6 (six) hours as needed.  Dispense: 120 tablet; Refill: 6 - For home use only DME standard manual wheelchair with seat cushion  3. Paraplegia - new wheel chair ordered  4. Essential hypertension, benign Do not add salt to  diet - CMP14+EGFR - NMR, lipoprofile - lisinopril (PRINIVIL,ZESTRIL) 10 MG tablet; Take 1 tablet (10 mg total) by mouth daily.  Dispense: 30 tablet; Refill: 5  5. Gastroesophageal reflux disease with esophagitis Avoid spicy foods - esomeprazole (NEXIUM) 40 MG capsule; Take 1 capsule (40 mg total) by mouth 2 (two) times daily.  Dispense: 60 capsule; Refill: 5  6. IBS (irritable bowel syndrome) - baclofen (LIORESAL) 10 MG tablet; TAKE 1 TABLET BY MOUTH TWICE DAILY  Dispense: 60 tablet; Refill: 5 - dicyclomine (BENTYL) 20 MG tablet; Take 1 tablet (20 mg total) by mouth every 6 (six) hours.  Dispense: 60 tablet; Refill: 5  7. GAD (generalized anxiety disorder) Stress manaegment - ALPRAZolam (XANAX) 0.25 MG tablet; Take 1 tablet (0.25 mg total) by mouth 2 (two) times daily.  Dispense: 60 tablet; Refill: 0    Labs pending Health maintenance reviewed Diet and exercise encouraged Continue all meds Follow up  In 6 month   [place], FNP

## 2014-11-26 ENCOUNTER — Other Ambulatory Visit: Payer: Self-pay | Admitting: Nurse Practitioner

## 2014-11-26 MED ORDER — PANTOPRAZOLE SODIUM 40 MG PO TBEC: 40.0000 mg | DELAYED_RELEASE_TABLET | Freq: Every day | ORAL | Status: DC

## 2014-11-30 ENCOUNTER — Telehealth: Payer: Self-pay | Admitting: Nurse Practitioner

## 2014-11-30 NOTE — Telephone Encounter (Signed)
Tiffany Stewart can you check on this- i sent Tiffany Stewart a message

## 2014-12-03 ENCOUNTER — Telehealth: Payer: Self-pay | Admitting: Nurse Practitioner

## 2014-12-04 NOTE — Telephone Encounter (Signed)
Done and ready

## 2014-12-09 ENCOUNTER — Telehealth: Payer: Self-pay | Admitting: Nurse Practitioner

## 2014-12-09 NOTE — Telephone Encounter (Signed)
[facility]

## 2014-12-09 NOTE — Telephone Encounter (Signed)
done

## 2014-12-28 ENCOUNTER — Other Ambulatory Visit: Payer: Self-pay | Admitting: Nurse Practitioner

## 2014-12-28 NOTE — Telephone Encounter (Signed)
Last filled and seen 11/19/14. Call into [facility]

## 2014-12-28 NOTE — Telephone Encounter (Signed)
rx called into pharmacy

## 2014-12-28 NOTE — Telephone Encounter (Signed)
Please call in xanax with 1 refills 

## 2015-01-11 ENCOUNTER — Other Ambulatory Visit: Payer: Self-pay | Admitting: Nurse Practitioner

## 2015-02-07 ENCOUNTER — Encounter (HOSPITAL_COMMUNITY): Payer: Self-pay | Admitting: *Deleted

## 2015-02-07 ENCOUNTER — Emergency Department (HOSPITAL_COMMUNITY): Payer: Medicare Other

## 2015-02-07 ENCOUNTER — Emergency Department (HOSPITAL_COMMUNITY)
Admission: EM | Admit: 2015-02-07 | Discharge: 2015-02-07 | Disposition: A | Discharge status: Home / Self Care | Payer: Medicare Other | Attending: Emergency Medicine | Admitting: Emergency Medicine

## 2015-02-07 DIAGNOSIS — Z79899 Other long term (current) drug therapy: Secondary | ICD-10-CM | POA: Insufficient documentation

## 2015-02-07 DIAGNOSIS — G43909 Migraine, unspecified, not intractable, without status migrainosus: Secondary | ICD-10-CM | POA: Insufficient documentation

## 2015-02-07 DIAGNOSIS — M25562 Pain in left knee: Secondary | ICD-10-CM | POA: Diagnosis not present

## 2015-02-07 DIAGNOSIS — Y998 Other external cause status: Secondary | ICD-10-CM | POA: Diagnosis not present

## 2015-02-07 DIAGNOSIS — F419 Anxiety disorder, unspecified: Secondary | ICD-10-CM | POA: Diagnosis not present

## 2015-02-07 DIAGNOSIS — S8002XA Contusion of left knee, initial encounter: Secondary | ICD-10-CM | POA: Diagnosis not present

## 2015-02-07 DIAGNOSIS — Y9289 Other specified places as the place of occurrence of the external cause: Secondary | ICD-10-CM | POA: Diagnosis not present

## 2015-02-07 DIAGNOSIS — Z720 Tobacco use: Secondary | ICD-10-CM | POA: Insufficient documentation

## 2015-02-07 DIAGNOSIS — S8992XA Unspecified injury of left lower leg, initial encounter: Secondary | ICD-10-CM | POA: Diagnosis not present

## 2015-02-07 DIAGNOSIS — Y9389 Activity, other specified: Secondary | ICD-10-CM | POA: Insufficient documentation

## 2015-02-07 DIAGNOSIS — K219 Gastro-esophageal reflux disease without esophagitis: Secondary | ICD-10-CM | POA: Insufficient documentation

## 2015-02-07 HISTORY — DX: Anxiety disorder, unspecified: F41.9

## 2015-02-07 MED ORDER — HYDROCODONE-ACETAMINOPHEN 5-325 MG PO TABS
1.0000 | ORAL_TABLET | Freq: Once | ORAL | Status: AC
  Administered 2015-02-07: 1 via ORAL
  Filled 2015-02-07: qty 1

## 2015-02-07 NOTE — ED Notes (Addendum)
Pt states that her wheelchair turned over yesterday, hitting left knee, also hit same knee again today, c/o left knee pain and headache, pt denies hitting her head, states that she has hx of migraines,

## 2015-02-07 NOTE — ED Provider Notes (Signed)
CSN: [id number]     Arrival date & time 02/07/15  2057 History   First MD Initiated Contact with Patient 02/07/15 2230     This chart was scribed for Tanna Furry, MD by Forrestine Him, ED Scribe. This patient was seen in room APA01/APA01 and the patient's care was started 10:35 PM.   Chief Complaint  Patient presents with  . Knee Pain   The history is provided by the patient. No language interpreter was used.    HPI Comments: Tiffany Stewart is a 44 y.o. female who presents to the Emergency Department complaining of constant, ongoing, unchanged L knee pain x 1 day. Pt states her wheelchair flipped backwards yesterday resulting in her hitting her knee against a hard surface. No head trauma or LOC. She also reports ongoing HA at this time. 2 doses of muscle relaxant attempted prior to arrival without any improvement for symptoms. No recent fever, chills, shortness of breath, or chest pain. At baseline pt uses a wheelchair to help her get around. She states she has some control in her lower extremities, but not enough to ambulate. Pt with known allergies to HCTZ, Morphine, and Zoloft.  Past Medical History  Diagnosis Date  . GERD (gastroesophageal reflux disease)   . Migraines   . Anxiety    Past Surgical History  Procedure Laterality Date  . Cholecystectomy    . Cesarean section     Family History  Problem Relation Age of Onset  . Diabetes Father   . Cancer Father   . Heart disease Father   . Heart disease Maternal Grandmother   . Stroke Maternal Grandfather   . Diabetes Paternal Grandmother    History  Substance Use Topics  . Smoking status: Current Every Day Smoker -- 1.00 packs/day for 27 years    Types: Cigarettes  . Smokeless tobacco: Never Used  . Alcohol Use: No   OB History    No data available     Review of Systems  Constitutional: Negative for fever, chills, diaphoresis, appetite change and fatigue.  HENT: Negative for mouth sores, sore throat and trouble swallowing.    Eyes: Negative for visual disturbance.  Respiratory: Negative for cough, chest tightness, shortness of breath and wheezing.   Cardiovascular: Negative for chest pain.  Gastrointestinal: Negative for nausea, vomiting, abdominal pain, diarrhea and abdominal distention.  Endocrine: Negative for polydipsia, polyphagia and polyuria.  Genitourinary: Negative for dysuria, frequency and hematuria.  Musculoskeletal: Positive for arthralgias. Negative for gait problem.  Skin: Negative for color change, pallor and rash.  Neurological: Negative for dizziness, syncope, light-headedness and headaches.  Hematological: Does not bruise/bleed easily.  Psychiatric/Behavioral: Negative for behavioral problems and confusion.      Allergies  Hctz; Morphine; and Zoloft  Home Medications   Prior to Admission medications   Medication Sig Start Date End Date Taking? Authorizing Provider  ALPRAZolam (XANAX) 0.25 MG tablet TAKE (1) TABLET TWICE DAILY. 12/28/14  Yes Mary-Margaret Hassell Done, FNP  Aspirin-Acetaminophen-Caffeine (GOODY HEADACHE PO) Take 2 packets by mouth every 4 (four) hours as needed (Pain).   Yes Historical Provider, MD  baclofen (LIORESAL) 10 MG tablet TAKE 1 TABLET BY MOUTH TWICE DAILY 11/19/14  Yes Mary-Margaret Hassell Done, FNP  dicyclomine (BENTYL) 20 MG tablet Take 1 tablet (20 mg total) by mouth every 6 (six) hours. 11/19/14  Yes Mary-Margaret Hassell Done, FNP  esomeprazole (NEXIUM) 20 MG capsule Take 20 mg by mouth daily at 12 noon.   Yes Historical Provider, MD  pantoprazole (PROTONIX) 40 MG  tablet Take 1 tablet (40 mg total) by mouth daily. 11/26/14  Yes Mary-Margaret Hassell Done, FNP  tiZANidine (ZANAFLEX) 4 MG tablet Take 0.5 tablets (2 mg total) by mouth every 6 (six) hours as needed. 11/19/14  Yes Mary-Margaret Hassell Done, FNP  lisinopril (PRINIVIL,ZESTRIL) 10 MG tablet Take 1 tablet (10 mg total) by mouth daily. 11/19/14   Mary-Margaret Hassell Done, FNP   Triage Vitals: BP 120/80 mmHg  Pulse 70  Temp(Src) 97.9 F  (36.6 C) (Oral)  Resp 20  Ht 5\' 5"  (1.651 m)  Wt 130 lb (58.968 kg)  BMI 21.63 kg/m2  SpO2 100%   Physical Exam  Constitutional: She is oriented to person, place, and time. She appears well-developed and well-nourished. No distress.  HENT:  Head: Normocephalic.  Eyes: Conjunctivae are normal. Pupils are equal, round, and reactive to light. No scleral icterus.  Neck: Normal range of motion. Neck supple. No thyromegaly present.  Cardiovascular: Normal rate and regular rhythm.  Exam reveals no gallop and no friction rub.   No murmur heard. Pulmonary/Chest: Effort normal and breath sounds normal. No respiratory distress. She has no wheezes. She has no rales.  Abdominal: Soft. Bowel sounds are normal. She exhibits no distension. There is no tenderness. There is no rebound.  Musculoskeletal: Normal range of motion.  Contusion to the L tubercle  Marked atrophy of bilateral lower extemities FROM No effusion Extensor mechanism intact  Neurological: She is alert and oriented to person, place, and time.  Skin: Skin is warm and dry. No rash noted.  Psychiatric: She has a normal mood and affect. Her behavior is normal.    ED Course  Procedures (including critical care time)  DIAGNOSTIC STUDIES: Oxygen Saturation is 100% on RA, Normal by my interpretation.    COORDINATION OF CARE: 10:35 PM- Will order DG knee complete 4 views L. Discussed treatment plan with pt at bedside and pt agreed to plan.     Labs Review Labs Reviewed - No data to display  Imaging Review Dg Knee Complete 4 Views Left  02/07/2015   CLINICAL DATA:  Left knee pain, hit left knee yesterday  EXAM: LEFT KNEE - COMPLETE 4+ VIEW  COMPARISON:  None.  FINDINGS: No fracture or dislocation.  No joint effusion.  IMPRESSION: No acute finding   Electronically Signed   By: Skipper Cliche M.D.   On: 02/07/2015 21:59     EKG Interpretation None      MDM   Final diagnoses:  Knee contusion, left, initial encounter    No  fracture. Normal range of motion. Extensor mechanism is intact. Plan is home conservative treatment, ice,Tylenol.  I personally performed the services described in this documentation, which was scribed in my presence. The recorded information has been reviewed and is accurate.   Tanna Furry, MD 02/07/15 [phone]

## 2015-02-07 NOTE — ED Notes (Signed)
Discharge instructions given. Pt concerned she was not getting a prescription for narcotic pain medication. Again instructed that chronic pain was treated by primary doctor and narcotic prescription would not given. Advised pt on OTC medications she could take and advised follow up instructions. Pt stated verbal understanding.

## 2015-02-07 NOTE — Discharge Instructions (Signed)
You have a contusion to your knee.  A contusion is a bruise, it heals on its own without specific treatment. Use ice as needed, over-the-counter Tylenol for pain.  Contusion A contusion is a deep bruise. Contusions happen when an injury causes bleeding under the skin. Signs of bruising include pain, puffiness (swelling), and discolored skin. The contusion may turn blue, purple, or yellow. HOME CARE   Put ice on the injured area.  Put ice in a plastic bag.  Place a towel between your skin and the bag.  Leave the ice on for 15-20 minutes, 03-04 times a day.  Only take medicine as told by your doctor.  Rest the injured area.  If possible, raise (elevate) the injured area to lessen puffiness. GET HELP RIGHT AWAY IF:   You have more bruising or puffiness.  You have pain that is getting worse.  Your puffiness or pain is not helped by medicine. MAKE SURE YOU:   Understand these instructions.  Will watch your condition.  Will get help right away if you are not doing well or get worse. Document Released: 01/17/2008 Document Revised: 10/23/2011 Document Reviewed: 06/05/2011 [facility] Patient Information 2015 [place], [place]. This information is not intended to replace advice given to you by your health care provider. Make sure you discuss any questions you have with your health care provider.

## 2015-02-08 ENCOUNTER — Telehealth: Payer: Self-pay | Admitting: Nurse Practitioner

## 2015-02-09 MED ORDER — IBUPROFEN 800 MG PO TABS: 800.0000 mg | ORAL_TABLET | Freq: Three times a day (TID) | ORAL | Status: DC | PRN

## 2015-02-09 NOTE — Telephone Encounter (Signed)
Pt notified of RX  verbalizes understanding 

## 2015-02-09 NOTE — Telephone Encounter (Signed)
Motrin 800 mg sent to pharmacy

## 2015-02-10 ENCOUNTER — Telehealth: Payer: Self-pay | Admitting: Nurse Practitioner

## 2015-02-10 MED ORDER — IBUPROFEN 800 MG PO TABS: 800.0000 mg | ORAL_TABLET | Freq: Three times a day (TID) | ORAL | Status: DC | PRN

## 2015-02-10 NOTE — Telephone Encounter (Signed)
Pt wanted Rx sent into [facility]

## 2015-03-02 ENCOUNTER — Telehealth: Payer: Self-pay | Admitting: Nurse Practitioner

## 2015-03-02 MED ORDER — ALPRAZOLAM 0.25 MG PO TABS: ORAL_TABLET | ORAL | Status: DC

## 2015-03-02 NOTE — Telephone Encounter (Signed)
Phoned to pharm

## 2015-03-02 NOTE — Telephone Encounter (Signed)
Please call in xanax 0.25 1 po bid prn #[phone]  with 0 refills

## 2015-03-31 ENCOUNTER — Other Ambulatory Visit: Payer: Self-pay | Admitting: Nurse Practitioner

## 2015-03-31 NOTE — Telephone Encounter (Signed)
Last seen 11/19/14 MMM  If approved route to nurse to call into [facility]   [phone]

## 2015-03-31 NOTE — Telephone Encounter (Signed)
Please call in xanax with 1 refills 

## 2015-04-29 ENCOUNTER — Telehealth: Payer: Self-pay | Admitting: Nurse Practitioner

## 2015-05-03 ENCOUNTER — Ambulatory Visit (INDEPENDENT_AMBULATORY_CARE_PROVIDER_SITE_OTHER): Payer: Medicare Other | Admitting: Nurse Practitioner

## 2015-05-03 ENCOUNTER — Other Ambulatory Visit: Payer: Self-pay | Admitting: Nurse Practitioner

## 2015-05-03 ENCOUNTER — Encounter: Payer: Self-pay | Admitting: Nurse Practitioner

## 2015-05-03 VITALS — BP 120/82 | HR 84 | Temp 97.7°F

## 2015-05-03 DIAGNOSIS — K219 Gastro-esophageal reflux disease without esophagitis: Secondary | ICD-10-CM

## 2015-05-03 DIAGNOSIS — F411 Generalized anxiety disorder: Secondary | ICD-10-CM | POA: Diagnosis not present

## 2015-05-03 DIAGNOSIS — G114 Hereditary spastic paraplegia: Secondary | ICD-10-CM

## 2015-05-03 DIAGNOSIS — K589 Irritable bowel syndrome without diarrhea: Secondary | ICD-10-CM

## 2015-05-03 DIAGNOSIS — G8220 Paraplegia, unspecified: Secondary | ICD-10-CM | POA: Diagnosis not present

## 2015-05-03 DIAGNOSIS — E785 Hyperlipidemia, unspecified: Secondary | ICD-10-CM | POA: Diagnosis not present

## 2015-05-03 DIAGNOSIS — I10 Essential (primary) hypertension: Secondary | ICD-10-CM

## 2015-05-03 MED ORDER — SERTRALINE HCL 50 MG PO TABS: 50.0000 mg | ORAL_TABLET | Freq: Every day | ORAL | Status: DC

## 2015-05-03 MED ORDER — TIZANIDINE HCL 4 MG PO TABS: 2.0000 mg | ORAL_TABLET | Freq: Four times a day (QID) | ORAL | Status: DC | PRN

## 2015-05-03 MED ORDER — LISINOPRIL 10 MG PO TABS: 10.0000 mg | ORAL_TABLET | Freq: Every day | ORAL | Status: DC

## 2015-05-03 MED ORDER — ALPRAZOLAM 0.25 MG PO TABS: ORAL_TABLET | ORAL | Status: DC

## 2015-05-03 MED ORDER — DICYCLOMINE HCL 20 MG PO TABS: 20.0000 mg | ORAL_TABLET | Freq: Four times a day (QID) | ORAL | Status: DC

## 2015-05-03 NOTE — Progress Notes (Addendum)
Subjective:    Patient ID: Tiffany Stewart, female    DOB: 04/23/1971, 44 y.o.   MRN: [id number]  Patient here today for follow up of chronic medical problems.   Hypertension This is a chronic problem. The current episode started more than 1 year ago. The problem is unchanged. The problem is controlled. Pertinent negatives include no chest pain, headaches, palpitations, shortness of breath or sweats. There are no associated agents to hypertension. Risk factors for coronary artery disease include dyslipidemia, post-menopausal state, sedentary lifestyle and smoking/tobacco exposure. Past treatments include ACE inhibitors. The current treatment provides moderate improvement. Compliance problems include diet and exercise.   Gerd Patient on nexium- has only been taking 1X a day and ordered BID- Says that she is having some heartburn 3-4 X a week Depression and GAD Patient on xanax- doing well on it but reports she feels like she needs more Since her last visit she has moved into her mother's home recently and increased her stress level. States she can not take the celexa because they make her feel "funny".  Spastic araplegia Takes Tizandine to control muscle spasms- helps a lot - has occassional muscle cramps- patient is wheel chair confined and her wheel chair is old- the brakes will not lock wheelchair and arm rest is falling off. Cannot [erform activities of Tiffany living (bathing,grooming and dressing) She is unable to use a walker. A wheel chair will allow her to safely perform Tiffany acivities. Patient can safely propel the wheelchair in her home. * Patient request a hospital bed with a trapeze bar and side rails- very difficult for her to change positions in bed due to paraplegia.  Review of Systems  HENT: Negative.  Negative for sore throat, trouble swallowing and voice change.   Respiratory: Negative for shortness of breath. Cough: deep productive cough.   Cardiovascular: Negative.  Negative for  chest pain and palpitations.  Gastrointestinal: Negative.   Genitourinary: Negative.   Musculoskeletal: Negative.   Neurological: Negative.  Negative for headaches.  Psychiatric/Behavioral: Negative.        Objective:   Physical Exam  Constitutional: She is oriented to person, place, and time. She appears well-developed.  HENT:  Head: Normocephalic.  Right Ear: External ear normal.  Left Ear: External ear normal.  Nose: Right sinus exhibits no maxillary sinus tenderness and no frontal sinus tenderness. Left sinus exhibits no maxillary sinus tenderness and no frontal sinus tenderness.  Mouth/Throat: Uvula is midline, oropharynx is clear and moist and mucous membranes are normal.  Eyes: Pupils are equal, round, and reactive to light.  Neck: Normal range of motion. Neck supple.  Cardiovascular: Normal rate, regular rhythm and normal heart sounds.   Pulmonary/Chest: Effort normal and breath sounds normal. No respiratory distress. She has no wheezes. She has no rales.  Abdominal: Soft. Bowel sounds are normal.  Musculoskeletal:  Both feet are turned inward  Neurological: She is alert and oriented to person, place, and time.  Skin: Skin is warm and dry.  Psychiatric: She has a normal mood and affect. Her behavior is normal. Judgment and thought content normal.   BP 120/82 mmHg  Pulse 84  Temp(Src) 97.7 F (36.5 C) (Oral)  Ht   Wt         Assessment & Plan:  1. Gastroesophageal reflux disease without esophagitis Avoid spicy foods, and eating 2 hours prior to bedtime  2. HSP (hereditary spastic paraplegia)  - tiZANidine (ZANAFLEX) 4 MG tablet; Take 0.5 tablets (2 mg total) by  mouth every 6 (six) hours as needed.  Dispense: 120 tablet; Refill: 6  3. Paraplegia Remains in wheelchair. Pt did receive her new wheelchair  4. IBS (irritable bowel syndrome)  - dicyclomine (BENTYL) 20 MG tablet; Take 1 tablet (20 mg total) by mouth every 6 (six) hours.  Dispense: 60 tablet;  Refill: 5  5. Essential hypertension, benign  - lisinopril (PRINIVIL,ZESTRIL) 10 MG tablet; Take 1 tablet (10 mg total) by mouth Tiffany.  Dispense: 30 tablet; Refill: 5 - CMP14+EGFR  6. Anxiety state Stress managment - ALPRAZolam (XANAX) 0.25 MG tablet; TAKE (1) TABLET TWICE Tiffany.  Dispense: 60 tablet; Refill: 0  7. Hyperlipidemia  - Lipid panel   Continue all meds Labs pending Health Maintenance reviewed Diet and exercise encouraged RTO 3 months  Mary-Margaret Hassell Done, FNP

## 2015-05-03 NOTE — Patient Instructions (Signed)
Stress and Stress Management Stress is a normal reaction to life events. It is what you feel when life demands more than you are used to or more than you can handle. Some stress can be useful. For example, the stress reaction can help you catch the last bus of the day, study for a test, or meet a deadline at work. But stress that occurs too often or for too long can cause problems. It can affect your emotional health and interfere with relationships and normal daily activities. Too much stress can weaken your immune system and increase your risk for physical illness. If you already have a medical problem, stress can make it worse. CAUSES  All sorts of life events may cause stress. An event that causes stress for one person may not be stressful for another person. Major life events commonly cause stress. These may be positive or negative. Examples include losing your job, moving into a new home, getting married, having a baby, or losing a loved one. Less obvious life events may also cause stress, especially if they occur day after day or in combination. Examples include working long hours, driving in traffic, caring for children, being in debt, or being in a difficult relationship. SIGNS AND SYMPTOMS Stress may cause emotional symptoms including, the following:  Anxiety. This is feeling worried, afraid, on edge, overwhelmed, or out of control.  Anger. This is feeling irritated or impatient.  Depression. This is feeling sad, down, helpless, or guilty.  Difficulty focusing, remembering, or making decisions. Stress may cause physical symptoms, including the following:   Aches and pains. These may affect your head, neck, back, stomach, or other areas of your body.  Tight muscles or clenched jaw.  Low energy or trouble sleeping. Stress may cause unhealthy behaviors, including the following:   Eating to feel better (overeating) or skipping meals.  Sleeping too little, too much, or both.  Working  too much or putting off tasks (procrastination).  Smoking, drinking alcohol, or using drugs to feel better. DIAGNOSIS  Stress is diagnosed through an assessment by your health care provider. Your health care provider will ask questions about your symptoms and any stressful life events.Your health care provider will also ask about your medical history and may order blood tests or other tests. Certain medical conditions and medicine can cause physical symptoms similar to stress. Mental illness can cause emotional symptoms and unhealthy behaviors similar to stress. Your health care provider may refer you to a mental health professional for further evaluation.  TREATMENT  Stress management is the recommended treatment for stress.The goals of stress management are reducing stressful life events and coping with stress in healthy ways.  Techniques for reducing stressful life events include the following:  Stress identification. Self-monitor for stress and identify what causes stress for you. These skills may help you to avoid some stressful events.  Time management. Set your priorities, keep a calendar of events, and learn to say "no." These tools can help you avoid making too many commitments. Techniques for coping with stress include the following:  Rethinking the problem. Try to think realistically about stressful events rather than ignoring them or overreacting. Try to find the positives in a stressful situation rather than focusing on the negatives.  Exercise. Physical exercise can release both physical and emotional tension. The key is to find a form of exercise you enjoy and do it regularly.  Relaxation techniques. These relax the body and mind. Examples include yoga, meditation, tai chi, biofeedback, deep  breathing, progressive muscle relaxation, listening to music, being out in nature, journaling, and other hobbies. Again, the key is to find one or more that you enjoy and can do  regularly.  Healthy lifestyle. Eat a balanced diet, get plenty of sleep, and do not smoke. Avoid using alcohol or drugs to relax.  Strong support network. Spend time with family, friends, or other people you enjoy being around.Express your feelings and talk things over with someone you trust. Counseling or talktherapy with a mental health professional may be helpful if you are having difficulty managing stress on your own. Medicine is typically not recommended for the treatment of stress.Talk to your health care provider if you think you need medicine for symptoms of stress. HOME CARE INSTRUCTIONS  Keep all follow-up visits as directed by your health care provider.  Take all medicines as directed by your health care provider. SEEK MEDICAL CARE IF:  Your symptoms get worse or you start having new symptoms.  You feel overwhelmed by your problems and can no longer manage them on your own. SEEK IMMEDIATE MEDICAL CARE IF:  You feel like hurting yourself or someone else. Document Released: 01/24/2001 Document Revised: 12/15/2013 Document Reviewed: 03/25/2013 [employer] Patient Information 2015 [employer]. This information is not intended to replace advice given to you by your health care provider. Make sure you discuss any questions you have with your health care provider.  

## 2015-05-04 LAB — CMP14+EGFR
ALBUMIN: 4.3 g/dL (ref 3.5–5.5)
ALT: 14 IU/L (ref 0–32)
AST: 24 IU/L (ref 0–40)
Albumin/Globulin Ratio: 1.9 (ref 1.1–2.5)
Alkaline Phosphatase: 49 IU/L (ref 39–117)
BUN / CREAT RATIO: 25 — AB (ref 9–23)
BUN: 12 mg/dL (ref 6–24)
Bilirubin Total: <0.2 mg/dL (ref 0.0–1.2)
CALCIUM: 8.9 mg/dL (ref 8.7–10.2)
CO2: 21 mmol/L (ref 18–29)
CREATININE: 0.48 mg/dL — AB (ref 0.57–1.00)
Chloride: 105 mmol/L (ref 97–108)
GFR calc Af Amer: 140 mL/min/{1.73_m2} (ref >59)
GFR calc non Af Amer: 121 mL/min/{1.73_m2} (ref >59)
GLOBULIN, TOTAL: 2.3 g/dL (ref 1.5–4.5)
Glucose: 95 mg/dL (ref 65–99)
Potassium: 4.2 mmol/L (ref 3.5–5.2)
SODIUM: 141 mmol/L (ref 134–144)
Total Protein: 6.6 g/dL (ref 6.0–8.5)

## 2015-05-04 LAB — LIPID PANEL
Chol/HDL Ratio: 3.4 ratio units (ref 0.0–4.4)
Cholesterol, Total: 183 mg/dL (ref 100–199)
HDL: 54 mg/dL (ref >39)
LDL CALC: 112 mg/dL — AB (ref 0–99)
TRIGLYCERIDES: 83 mg/dL (ref 0–149)
VLDL Cholesterol Cal: 17 mg/dL (ref 5–40)

## 2015-05-29 ENCOUNTER — Telehealth: Payer: Self-pay | Admitting: Nurse Practitioner

## 2015-05-29 NOTE — Telephone Encounter (Signed)
Left message for patient to call back to see exactly what she needed.

## 2015-05-31 ENCOUNTER — Other Ambulatory Visit: Payer: Self-pay | Admitting: Nurse Practitioner

## 2015-06-03 NOTE — Telephone Encounter (Signed)
Please call in xanax with 1 refills 

## 2015-06-03 NOTE — Telephone Encounter (Signed)
rx called to pharmacy 

## 2015-06-03 NOTE — Telephone Encounter (Signed)
Last filled 05/06/15, last seen 05/03/15. Call in at [facility]

## 2015-06-07 ENCOUNTER — Telehealth: Payer: Self-pay | Admitting: Nurse Practitioner

## 2015-06-08 NOTE — Telephone Encounter (Signed)
She wants a hospital bed and a lift chair, I do not know her, does she need and I'll need notes and order

## 2015-06-08 NOTE — Telephone Encounter (Signed)
She has paraplegia- it is in her problem list- will that not cover it.

## 2015-06-08 NOTE — Telephone Encounter (Signed)
Patient is a paraplegic, she said she is in need of a hospital bed and a lift chair to assist her with getting up and down.  Patient states if she is unable to get both she has greater need for the hospital bed.  Can you help her with this?

## 2015-06-09 NOTE — Telephone Encounter (Signed)
Sure, just need orders

## 2015-06-10 ENCOUNTER — Other Ambulatory Visit: Payer: Self-pay | Admitting: Nurse Practitioner

## 2015-06-10 DIAGNOSIS — G8220 Paraplegia, unspecified: Secondary | ICD-10-CM

## 2015-06-10 NOTE — Telephone Encounter (Signed)
Order in computer- thank you

## 2015-06-11 NOTE — Telephone Encounter (Signed)
Faxed Rx for hospital bed to [facility]

## 2015-06-14 ENCOUNTER — Telehealth: Payer: Self-pay | Admitting: Nurse Practitioner

## 2015-06-21 NOTE — Telephone Encounter (Signed)
Order and notes sent to [facility] on 06/18/15. Pt aware

## 2015-06-29 ENCOUNTER — Other Ambulatory Visit: Payer: Self-pay | Admitting: Nurse Practitioner

## 2015-06-30 NOTE — Telephone Encounter (Signed)
Last seen 05/03/15 MMM  If approved route to nurse to call into [facility]  [phone]

## 2015-07-01 NOTE — Telephone Encounter (Signed)
rx called into pharmacy

## 2015-07-01 NOTE — Telephone Encounter (Signed)
Please call in xanax with 1 refills 

## 2015-07-15 ENCOUNTER — Telehealth: Payer: Self-pay | Admitting: Nurse Practitioner

## 2015-07-15 NOTE — Telephone Encounter (Signed)
Left message to call back with length of symptoms and description.

## 2015-07-15 NOTE — Telephone Encounter (Signed)
Nasal congestion, sneezing, productive cough x 1 week, and hotflashes/chills but no fever per thermometer. She hasn't taken anything OTC because she's afraid it will hurt her stomach because she isn't able to eat very much. Says that she has stomach problems for "a while." She says that this has been evaluated and was told it was a problem with her "muscles and her nerves." She is on Bentyl. Explained that any medicine she takes on an empty stomach may cause abd pain and/or nausea/vomiting.  Advised that It is preferable that she is seen so that we can treat her properly and rule out a more serious condition such as pneumonia but she states that she has paraplegia and that it is difficult to have transportation arranged.  Would you be willing to send in an antibiotic?   She would also like a prescription sent to [facility] in [place] for tub rails to help her get in and out.

## 2015-07-15 NOTE — Telephone Encounter (Signed)
What do i do to order rails to help her get out of tube

## 2015-07-15 NOTE — Telephone Encounter (Signed)
NTBS for antibiotic--

## 2015-07-16 NOTE — Telephone Encounter (Signed)
I will handle rails.

## 2015-07-21 ENCOUNTER — Telehealth: Payer: Self-pay

## 2015-07-21 NOTE — Telephone Encounter (Signed)
Advised patient of mmm's advise

## 2015-07-21 NOTE — Telephone Encounter (Signed)
Patient states that she has a bad cough and congestion. Her sides are hurting from coughing so much. Wants to know if something can be called in because its difficult for her to arrange transportation. Please advise

## 2015-07-21 NOTE — Telephone Encounter (Signed)
Sorry but will ntbs because has not been seen in 3 months- can use mucinex OTC and force fluids nad use humidifier if cannot come in.

## 2015-07-23 NOTE — Telephone Encounter (Signed)
Handicap bars for tub have been ordered from [place] [facility] today, pt aware

## 2015-08-05 ENCOUNTER — Ambulatory Visit: Payer: Medicare Other | Admitting: Nurse Practitioner

## 2015-09-15 ENCOUNTER — Ambulatory Visit: Payer: Medicare Other | Admitting: Nurse Practitioner

## 2015-09-15 ENCOUNTER — Other Ambulatory Visit: Payer: Self-pay

## 2015-09-15 NOTE — Telephone Encounter (Signed)
Last seen 05/03/15  MMM If approved route to nurse to call into [facility]   [phone]

## 2015-09-16 MED ORDER — ALPRAZOLAM 0.25 MG PO TABS: ORAL_TABLET | ORAL | Status: DC

## 2015-09-16 NOTE — Telephone Encounter (Signed)
Refill called to [facility] 

## 2015-09-16 NOTE — Telephone Encounter (Signed)
Please call in xanax with 1 refills 

## 2015-10-08 ENCOUNTER — Telehealth: Payer: Self-pay | Admitting: Nurse Practitioner

## 2015-10-22 ENCOUNTER — Ambulatory Visit: Payer: Medicare Other | Admitting: Nurse Practitioner

## 2015-10-25 ENCOUNTER — Encounter: Payer: Self-pay | Admitting: Nurse Practitioner

## 2015-10-29 ENCOUNTER — Encounter: Payer: Self-pay | Admitting: Nurse Practitioner

## 2015-10-29 ENCOUNTER — Ambulatory Visit (INDEPENDENT_AMBULATORY_CARE_PROVIDER_SITE_OTHER): Payer: Medicare Other | Admitting: Nurse Practitioner

## 2015-10-29 ENCOUNTER — Ambulatory Visit (INDEPENDENT_AMBULATORY_CARE_PROVIDER_SITE_OTHER): Payer: Medicare Other

## 2015-10-29 VITALS — BP 143/101 | HR 78 | Temp 97.0°F

## 2015-10-29 DIAGNOSIS — K5900 Constipation, unspecified: Secondary | ICD-10-CM

## 2015-10-29 DIAGNOSIS — F411 Generalized anxiety disorder: Secondary | ICD-10-CM | POA: Diagnosis not present

## 2015-10-29 DIAGNOSIS — R1033 Periumbilical pain: Secondary | ICD-10-CM | POA: Diagnosis not present

## 2015-10-29 MED ORDER — ALPRAZOLAM 0.25 MG PO TABS: ORAL_TABLET | ORAL | Status: DC

## 2015-10-29 NOTE — Patient Instructions (Addendum)

## 2015-10-29 NOTE — Progress Notes (Signed)
Subjective:    Patient ID: Tiffany Stewart, female    DOB: [birth year], 45 y.o.   MRN: [id number]   Current Outpatient Prescriptions on File Prior to Visit  Medication Sig Dispense Refill  . ALPRAZolam (XANAX) 0.25 MG tablet TAKE (1) TABLET TWICE DAILY. 60 tablet 1  . Aspirin-Acetaminophen-Caffeine (GOODY HEADACHE PO) Take 2 packets by mouth every 4 (four) hours as needed (Pain).    . baclofen (LIORESAL) 10 MG tablet TAKE 1 TABLET BY MOUTH TWICE DAILY 60 tablet 5  . dicyclomine (BENTYL) 20 MG tablet Take 1 tablet (20 mg total) by mouth every 6 (six) hours. 60 tablet 5  . esomeprazole (NEXIUM) 20 MG capsule Take 20 mg by mouth daily at 12 noon.    [name] lisinopril (PRINIVIL,ZESTRIL) 10 MG tablet Take 1 tablet (10 mg total) by mouth daily. 30 tablet 5  . pantoprazole (PROTONIX) 40 MG tablet Take 1 tablet (40 mg total) by mouth daily. 30 tablet 3  . tiZANidine (ZANAFLEX) 4 MG tablet Take 0.5 tablets (2 mg total) by mouth every 6 (six) hours as needed. 120 tablet 6   No current facility-administered medications on file prior to visit.   Pt in today with c/o lower abd pain. Stress tends to make it worse. If she tries to eat she only throws it back up. Noting helps the pain. It only gets worse.  Abdominal Pain This is a recurrent problem. The current episode started in the past 7 days. The onset quality is gradual. The problem occurs constantly. The problem has been gradually worsening. The pain is located in the periumbilical region. The pain is at a severity of 9/10. The pain is severe. The quality of the pain is sharp and aching. Associated symptoms include nausea and vomiting. The pain is aggravated by movement. The pain is relieved by nothing. She has tried nothing for the symptoms. The treatment provided no relief.      Review of Systems  Constitutional: Negative.   HENT: Negative.   Eyes: Negative.   Respiratory: Negative.   Gastrointestinal: Positive for nausea, vomiting and abdominal pain.        Sharp shooting pains in the umbilical area. N/V for the last 2 days  Endocrine: Negative.   Genitourinary: Negative.   Musculoskeletal: Negative.   Skin: Negative.   Allergic/Immunologic: Negative.   Neurological: Negative.   Hematological: Negative.   Psychiatric/Behavioral: Negative.        Objective:   Physical Exam  Constitutional: She is oriented to person, place, and time.  HENT:  Right Ear: Hearing, tympanic membrane, external ear and ear canal normal.  Left Ear: Hearing, tympanic membrane, external ear and ear canal normal.  Nose: Nose normal.  Mouth/Throat: Uvula is midline, oropharynx is clear and moist and mucous membranes are normal.  Eyes: Conjunctivae and EOM are normal.  Cardiovascular: Normal rate, regular rhythm, normal heart sounds and normal pulses.   Abdominal: Soft. Normal appearance and bowel sounds are normal. There is tenderness in the periumbilical area.  Neurological: She is alert and oriented to person, place, and time.  Skin: Skin is warm, dry and intact.  Psychiatric: She has a normal mood and affect. Her behavior is normal. Judgment and thought content normal.   BP 143/101 mmHg  Pulse 78  Temp(Src) 97 F (36.1 C) (Oral)  Ht   Wt   KUB- constipation-Preliminary reading by Ronnald Collum, FNP  [facility]      Assessment & Plan:  1. Periumbilical abdominal pain - DG  Abd 1 View; Future  2. Anxiety state Stress management - ALPRAZolam (XANAX) 0.25 MG tablet; TAKE (1) TABLET TWICE DAILY.  Dispense: 60 tablet; Refill: 1  3. Constipation, unspecified constipation type Patient wants to do laxative OTC Force fluids Increase fiber in diet miralax QOD once has good results  Mary-Margaret Hassell Done, FNP

## 2015-11-05 ENCOUNTER — Ambulatory Visit: Payer: Medicare Other | Admitting: Nurse Practitioner

## 2015-11-11 ENCOUNTER — Other Ambulatory Visit: Payer: Self-pay | Admitting: Nurse Practitioner

## 2015-11-11 ENCOUNTER — Telehealth: Payer: Self-pay | Admitting: Nurse Practitioner

## 2015-11-11 DIAGNOSIS — G114 Hereditary spastic paraplegia: Secondary | ICD-10-CM

## 2015-11-11 MED ORDER — TIZANIDINE HCL 4 MG PO TABS: 2.0000 mg | ORAL_TABLET | Freq: Four times a day (QID) | ORAL | Status: DC | PRN

## 2015-11-15 MED ORDER — TIZANIDINE HCL 4 MG PO TABS: 4.0000 mg | ORAL_TABLET | Freq: Three times a day (TID) | ORAL | Status: DC

## 2015-11-15 NOTE — Telephone Encounter (Signed)
Patient aware.

## 2015-11-15 NOTE — Telephone Encounter (Signed)
tizadine rx sent to pharmacy- 1 po TID

## 2015-11-15 NOTE — Telephone Encounter (Signed)
Patient states that she is taking a whole tablet three times a day. I called the pharmacy and they state she is getting tizadine 4mg  take 0.5 tablet every 6 hours #30. Pharmacy states that it is 13 days to early. Patient states that you told her she could take 3 whole tablets a day. Please advise

## 2015-11-15 NOTE — Telephone Encounter (Signed)
Patient states that she thought her Tizadine was supposed to be increased. Please advise

## 2015-11-15 NOTE — Telephone Encounter (Signed)
She is already taking 6 tablets a day-  I do not know what lawyer papers she is talking about.

## 2015-11-16 DIAGNOSIS — G8220 Paraplegia, unspecified: Secondary | ICD-10-CM | POA: Diagnosis not present

## 2015-12-13 DIAGNOSIS — G8220 Paraplegia, unspecified: Secondary | ICD-10-CM | POA: Diagnosis not present

## 2015-12-16 DIAGNOSIS — G8220 Paraplegia, unspecified: Secondary | ICD-10-CM | POA: Diagnosis not present

## 2015-12-20 ENCOUNTER — Other Ambulatory Visit: Payer: Self-pay | Admitting: Nurse Practitioner

## 2015-12-21 NOTE — Telephone Encounter (Signed)
Last seen 10/29/15  MMM  If approved route to nurse to call into [facility]   [phone]

## 2016-01-13 DIAGNOSIS — G8220 Paraplegia, unspecified: Secondary | ICD-10-CM | POA: Diagnosis not present

## 2016-01-26 ENCOUNTER — Other Ambulatory Visit: Payer: Self-pay

## 2016-01-26 MED ORDER — ALPRAZOLAM 0.25 MG PO TABS: ORAL_TABLET | ORAL | Status: DC

## 2016-01-26 NOTE — Telephone Encounter (Signed)
Last seen 10/29/15  MMM  If approved route to nurse to call [facility]  [phone]

## 2016-02-24 ENCOUNTER — Other Ambulatory Visit: Payer: Self-pay | Admitting: Nurse Practitioner

## 2016-02-24 MED ORDER — ALPRAZOLAM 0.25 MG PO TABS: ORAL_TABLET | ORAL | Status: DC

## 2016-02-24 MED ORDER — ESOMEPRAZOLE MAGNESIUM 20 MG PO CPDR: 20.0000 mg | DELAYED_RELEASE_CAPSULE | Freq: Every day | ORAL | Status: DC

## 2016-02-24 NOTE — Telephone Encounter (Signed)
RX for Xanax called into [facility] Okayed per MMM

## 2016-02-24 NOTE — Telephone Encounter (Signed)
Please call in xanax 0.25 1 po BID #[id number]  with 0 refills 

## 2016-03-07 ENCOUNTER — Ambulatory Visit (INDEPENDENT_AMBULATORY_CARE_PROVIDER_SITE_OTHER): Payer: Medicare Other | Admitting: Nurse Practitioner

## 2016-03-07 ENCOUNTER — Telehealth: Payer: Self-pay | Admitting: Nurse Practitioner

## 2016-03-07 ENCOUNTER — Encounter: Payer: Self-pay | Admitting: Nurse Practitioner

## 2016-03-07 VITALS — BP 130/90 | HR 75 | Temp 97.3°F

## 2016-03-07 DIAGNOSIS — R350 Frequency of micturition: Secondary | ICD-10-CM | POA: Diagnosis not present

## 2016-03-07 DIAGNOSIS — G114 Hereditary spastic paraplegia: Secondary | ICD-10-CM | POA: Diagnosis not present

## 2016-03-07 DIAGNOSIS — F17200 Nicotine dependence, unspecified, uncomplicated: Secondary | ICD-10-CM

## 2016-03-07 DIAGNOSIS — K589 Irritable bowel syndrome without diarrhea: Secondary | ICD-10-CM | POA: Diagnosis not present

## 2016-03-07 DIAGNOSIS — K219 Gastro-esophageal reflux disease without esophagitis: Secondary | ICD-10-CM | POA: Diagnosis not present

## 2016-03-07 DIAGNOSIS — I10 Essential (primary) hypertension: Secondary | ICD-10-CM | POA: Diagnosis not present

## 2016-03-07 DIAGNOSIS — F411 Generalized anxiety disorder: Secondary | ICD-10-CM | POA: Diagnosis not present

## 2016-03-07 DIAGNOSIS — R300 Dysuria: Secondary | ICD-10-CM

## 2016-03-07 LAB — URINALYSIS, COMPLETE
Bilirubin, UA: NEGATIVE
Glucose, UA: NEGATIVE
Ketones, UA: NEGATIVE
Leukocytes, UA: NEGATIVE
Nitrite, UA: NEGATIVE
PH UA: 6.5 (ref 5.0–7.5)
PROTEIN UA: NEGATIVE
RBC, UA: NEGATIVE
Specific Gravity, UA: 1.02 (ref 1.005–1.030)
UUROB: 0.2 mg/dL (ref 0.2–1.0)

## 2016-03-07 LAB — MICROSCOPIC EXAMINATION
Bacteria, UA: NONE SEEN
WBC UA: NONE SEEN /HPF (ref 0–?)

## 2016-03-07 MED ORDER — ESCITALOPRAM OXALATE 10 MG PO TABS
10.0000 mg | ORAL_TABLET | Freq: Every day | ORAL | 5 refills | Status: DC
Start: 2016-03-07 — End: 2016-06-29

## 2016-03-07 MED ORDER — ALPRAZOLAM 0.25 MG PO TABS: ORAL_TABLET | ORAL | 2 refills | Status: DC

## 2016-03-07 MED ORDER — DICYCLOMINE HCL 20 MG PO TABS: 20.0000 mg | ORAL_TABLET | Freq: Four times a day (QID) | ORAL | 5 refills | Status: DC

## 2016-03-07 MED ORDER — TIZANIDINE HCL 4 MG PO TABS: 4.0000 mg | ORAL_TABLET | Freq: Three times a day (TID) | ORAL | 5 refills | Status: DC

## 2016-03-07 MED ORDER — NITROFURANTOIN MONOHYD MACRO 100 MG PO CAPS: 100.0000 mg | ORAL_CAPSULE | Freq: Two times a day (BID) | ORAL | 0 refills | Status: DC

## 2016-03-07 MED ORDER — ESOMEPRAZOLE MAGNESIUM 20 MG PO CPDR: 20.0000 mg | DELAYED_RELEASE_CAPSULE | Freq: Every day | ORAL | 1 refills | Status: DC

## 2016-03-07 MED ORDER — BACLOFEN 10 MG PO TABS: ORAL_TABLET | ORAL | 5 refills | Status: DC

## 2016-03-07 MED ORDER — LISINOPRIL 10 MG PO TABS: 10.0000 mg | ORAL_TABLET | Freq: Every day | ORAL | 5 refills | Status: DC

## 2016-03-07 NOTE — Telephone Encounter (Signed)
Patient had questions about medications sent to pharmacy.  Informed patient of macrobid and lexapro

## 2016-03-07 NOTE — Progress Notes (Addendum)
Subjective:    Patient ID: Tiffany Stewart, female    DOB: 05/09/1971, 45 y.o.   MRN: [id number]  Patient here today for follow up of chronic medical problems.  Outpatient Encounter Prescriptions as of 03/07/2016  Medication Sig  . ALPRAZolam (XANAX) 0.25 MG tablet TAKE (1) TABLET TWICE DAILY.  [name] Aspirin-Acetaminophen-Caffeine (GOODY HEADACHE PO) Take 2 packets by mouth every 4 (four) hours as needed (Pain).  . baclofen (LIORESAL) 10 MG tablet TAKE 1 TABLET BY MOUTH TWICE DAILY  . dicyclomine (BENTYL) 20 MG tablet Take 1 tablet (20 mg total) by mouth every 6 (six) hours.  [name] esomeprazole (NEXIUM) 20 MG capsule Take 1 capsule (20 mg total) by mouth daily at 12 noon.  [name] lisinopril (PRINIVIL,ZESTRIL) 10 MG tablet Take 1 tablet (10 mg total) by mouth daily.  [name] tiZANidine (ZANAFLEX) 4 MG tablet Take 1 tablet (4 mg total) by mouth 3 (three) times daily.  . [DISCONTINUED] pantoprazole (PROTONIX) 40 MG tablet Take 1 tablet (40 mg total) by mouth daily.   No facility-administered encounter medications on file as of 03/07/2016.    * C/o dysuira , frequency and urgency- started about 3 days ago- denies fever.  Hypertension  This is a chronic problem. The current episode started more than 1 year ago. The problem is unchanged. The problem is controlled. Pertinent negatives include no chest pain, headaches, palpitations, shortness of breath or sweats. There are no associated agents to hypertension. Risk factors for coronary artery disease include dyslipidemia, post-menopausal state, sedentary lifestyle and smoking/tobacco exposure. Past treatments include ACE inhibitors. The current treatment provides moderate improvement. Compliance problems include diet and exercise.   Gerd Patient on nexium- has only been taking 1X a day and ordered BID- Says that she is having some heartburn 3-4 X a week Depression and GAD Patient on xanax- doing well on it but reports she feels like she needs more Since her last visit  she has moved into her mother's home recently and increased her stress level. Xanax is not helping. SHe use to be on celexa, but they made her feel funny so she stopped taking. GAD 7 : Generalized Anxiety Score 03/07/2016  Nervous, Anxious, on Edge 2  Control/stop worrying 2  Worry too much - different things 2  Trouble relaxing 1  Restless 2  Easily annoyed or irritable 2  Afraid - awful might happen 1  Total GAD 7 Score 12  Anxiety Difficulty Somewhat difficult   Depression screen [facility] 2/9 03/07/2016 10/27/2013  Decreased Interest 0 1  Down, Depressed, Hopeless 0 1  PHQ - 2 Score 0 2  Altered sleeping - 1  Tired, decreased energy - 1  Change in appetite - 1  Feeling bad or failure about yourself  - 1  Trouble concentrating - 1  Moving slowly or fidgety/restless - 1  Suicidal thoughts - 0  PHQ-9 Score - 8   Spastic araplegia Takes Tizandine to control muscle spasms- helps a lot - has occassional muscle cramps- patient is wheel chair confined and her wheel chair is old- the brakes will not lock wheelchair and arm rest is falling off. Cannot [erform activities of daily living (bathing,grooming and dressing) She is unable to use a walker. A wheel chair will allow her to safely perform daily acivities. Patient can safely propel the wheelchair in her home. IBS Alternates between constipation and diarrhea- takes her bentyl and baclofen as needed.  Review of Systems  Constitutional: Negative.   HENT: Negative.  Negative for  sore throat, trouble swallowing and voice change.   Respiratory: Negative for shortness of breath. Cough: deep productive cough.   Cardiovascular: Negative.  Negative for chest pain and palpitations.  Gastrointestinal: Negative.   Genitourinary: Positive for dysuria, frequency and urgency.  Musculoskeletal: Negative.   Neurological: Negative.  Negative for headaches.  Psychiatric/Behavioral: Negative.   All other systems reviewed and are negative.      Objective:    Physical Exam  Constitutional: She is oriented to person, place, and time. She appears well-developed.  HENT:  Head: Normocephalic.  Right Ear: External ear normal.  Left Ear: External ear normal.  Nose: Right sinus exhibits no maxillary sinus tenderness and no frontal sinus tenderness. Left sinus exhibits no maxillary sinus tenderness and no frontal sinus tenderness.  Mouth/Throat: Uvula is midline, oropharynx is clear and moist and mucous membranes are normal.  Eyes: Pupils are equal, round, and reactive to light.  Neck: Normal range of motion. Neck supple.  Cardiovascular: Normal rate, regular rhythm and normal heart sounds.   Pulmonary/Chest: Effort normal and breath sounds normal. No respiratory distress. She has no wheezes. She has no rales.  Abdominal: Soft. Bowel sounds are normal.  Musculoskeletal:   Sitting in wheel chair Both feet are turned inward  Neurological: She is alert and oriented to person, place, and time.  Skin: Skin is warm and dry.  Psychiatric: She has a normal mood and affect. Her behavior is normal. Judgment and thought content normal.   BP 130/90   Pulse 75   Temp 97.3 F (36.3 C) (Oral)       Assessment & Plan:  1. Frequent urination - Urinalysis, Complete  2. Gastroesophageal reflux disease without esophagitis Avoid spicy foods Do not eat 2 hours prior to bedtime - esomeprazole (NEXIUM) 20 MG capsule; Take 1 capsule (20 mg total) by mouth daily at 12 noon.  Dispense: 90 capsule; Refill: 1  3. HSP (hereditary spastic paraplegia) (HCC) - tiZANidine (ZANAFLEX) 4 MG tablet; Take 1 tablet (4 mg total) by mouth 3 (three) times daily.  Dispense: 90 tablet; Refill: 5  4. Anxiety state stres smanagement Added lexapro and encouraged to take for a t least a month- any immediate side effects should go away. - ALPRAZolam (XANAX) 0.25 MG tablet; TAKE (1) TABLET TWICE DAILY.  Dispense: 60 tablet; Refill: 2 - escitalopram (LEXAPRO) 10 MG tablet; Take 1  tablet (10 mg total) by mouth daily.  Dispense: 30 tablet; Refill: 5  5. TOBACCO ABUSE Smoking cessation encouraged  6. Essential hypertension, benign Do not add salt to diet - lisinopril (PRINIVIL,ZESTRIL) 10 MG tablet; Take 1 tablet (10 mg total) by mouth daily.  Dispense: 30 tablet; Refill: 5 - CMP14+EGFR - Lipid panel  7. IBS (irritable bowel syndrome) Watch diet - baclofen (LIORESAL) 10 MG tablet; TAKE 1 TABLET BY MOUTH TWICE DAILY  Dispense: 60 tablet; Refill: 5 - dicyclomine (BENTYL) 20 MG tablet; Take 1 tablet (20 mg total) by mouth every 6 (six) hours.  Dispense: 60 tablet; Refill: 5  8. Dysuria Take medication as prescribe Cotton underwear Take shower not bath Cranberry juice, yogurt Force fluids AZO over the counter X2 days Culture pending RTO prn - nitrofurantoin, macrocrystal-monohydrate, (MACROBID) 100 MG capsule; Take 1 capsule (100 mg total) by mouth 2 (two) times daily. 1 po BId  Dispense: 14 capsule; Refill: 0    Labs pending Health maintenance reviewed Diet and exercise encouraged Continue all meds Follow up  In 6 months   [place], FNP

## 2016-03-07 NOTE — Patient Instructions (Signed)
Take medication as prescribe Cotton underwear Take shower not bath Cranberry juice, yogurt Force fluids AZO over the counter X2 days Culture pending RTO prn  

## 2016-03-30 ENCOUNTER — Other Ambulatory Visit: Payer: Self-pay | Admitting: Nurse Practitioner

## 2016-05-27 ENCOUNTER — Other Ambulatory Visit: Payer: Self-pay | Admitting: Nurse Practitioner

## 2016-05-27 DIAGNOSIS — F411 Generalized anxiety disorder: Secondary | ICD-10-CM

## 2016-05-29 NOTE — Telephone Encounter (Signed)
Please call in xanax with 1 refills 

## 2016-05-29 NOTE — Telephone Encounter (Signed)
Refill called to [facility] 

## 2016-05-29 NOTE — Telephone Encounter (Signed)
Last filled 05/01/16, last seen 03/07/16. Call in

## 2016-06-12 ENCOUNTER — Telehealth: Payer: Self-pay | Admitting: Nurse Practitioner

## 2016-06-12 NOTE — Telephone Encounter (Signed)
Patient is having nausea and vomiting.  Patient offered appt today at 4:30 with Dr. Evette Doffing but declined and wants to see MMM.  Appt made tomorrow at 9:00 am with MMM.

## 2016-06-13 ENCOUNTER — Ambulatory Visit: Payer: Medicare Other | Admitting: Nurse Practitioner

## 2016-06-14 ENCOUNTER — Ambulatory Visit: Payer: Medicare Other | Admitting: Pediatrics

## 2016-06-15 ENCOUNTER — Telehealth: Payer: Self-pay | Admitting: Nurse Practitioner

## 2016-06-15 ENCOUNTER — Encounter: Payer: Self-pay | Admitting: Nurse Practitioner

## 2016-06-21 ENCOUNTER — Ambulatory Visit: Payer: Medicare Other | Admitting: Nurse Practitioner

## 2016-06-29 ENCOUNTER — Encounter: Payer: Self-pay | Admitting: Nurse Practitioner

## 2016-06-29 ENCOUNTER — Ambulatory Visit (INDEPENDENT_AMBULATORY_CARE_PROVIDER_SITE_OTHER): Payer: Medicare Other | Admitting: Nurse Practitioner

## 2016-06-29 VITALS — BP 96/61 | HR 105 | Temp 97.0°F

## 2016-06-29 DIAGNOSIS — J0101 Acute recurrent maxillary sinusitis: Secondary | ICD-10-CM | POA: Diagnosis not present

## 2016-06-29 DIAGNOSIS — F411 Generalized anxiety disorder: Secondary | ICD-10-CM | POA: Diagnosis not present

## 2016-06-29 MED ORDER — ALPRAZOLAM 0.25 MG PO TABS: ORAL_TABLET | ORAL | 1 refills | Status: DC

## 2016-06-29 MED ORDER — ESCITALOPRAM OXALATE 20 MG PO TABS: 20.0000 mg | ORAL_TABLET | Freq: Every day | ORAL | 5 refills | Status: DC

## 2016-06-29 MED ORDER — AZITHROMYCIN 250 MG PO TABS: ORAL_TABLET | ORAL | 0 refills | Status: DC

## 2016-06-29 MED ORDER — BENZONATATE 100 MG PO CAPS: 100.0000 mg | ORAL_CAPSULE | Freq: Three times a day (TID) | ORAL | 0 refills | Status: DC | PRN

## 2016-06-29 NOTE — Patient Instructions (Signed)

## 2016-06-29 NOTE — Progress Notes (Addendum)
Subjective:    Patient ID: Tiffany Stewart, female    DOB: 1970/08/28, 45 y.o.   MRN: [id number]  HPI Patient comes in today with C/O: - cough and congestion- started 1 week ago- has had intermittent low grade fever. Decreased appetitie. - anxiety- says that she stays on edge all the time. SHe has a lot going on in her personal life- she has a daughter that has sieizure disorder and her seizures are getting worse.She has been out of her xanax for several dasy- was able to get filled yesterday. GAD 7 : Generalized Anxiety Score 06/29/2016 03/07/2016  Nervous, Anxious, on Edge 2 2  Control/stop worrying 3 2  Worry too much - different things 3 2  Trouble relaxing 3 1  Restless 3 2  Easily annoyed or irritable 3 2  Afraid - awful might happen 3 1  Total GAD 7 Score 20 12  Anxiety Difficulty Very difficult Somewhat difficult      Review of Systems  Constitutional: Positive for appetite change (decreased), chills, fatigue and fever.  HENT: Positive for congestion, sinus pain and sinus pressure. Negative for ear pain, sore throat and trouble swallowing.   Respiratory: Positive for cough.   Cardiovascular: Negative.   Gastrointestinal: Negative.   Genitourinary: Negative.   Neurological: Negative.   Psychiatric/Behavioral: Negative.   All other systems reviewed and are negative.      Objective:   Physical Exam  Constitutional: She is oriented to person, place, and time. She appears well-developed and well-nourished.  HENT:  Right Ear: Hearing, tympanic membrane, external ear and ear canal normal.  Left Ear: Hearing, tympanic membrane, external ear and ear canal normal.  Nose: Mucosal edema and rhinorrhea present. Right sinus exhibits maxillary sinus tenderness. Left sinus exhibits maxillary sinus tenderness.  Mouth/Throat: Uvula is midline, oropharynx is clear and moist and mucous membranes are normal.  Neck: Normal range of motion. Neck supple.  Cardiovascular: Normal rate, regular  rhythm and normal heart sounds.   Pulmonary/Chest: Effort normal and breath sounds normal. No respiratory distress. She has no wheezes.  Neurological: She is alert and oriented to person, place, and time.  Skin: Skin is warm and dry.  Psychiatric: She has a normal mood and affect. Her behavior is normal. Judgment and thought content normal.   BP 96/61   Pulse (!) 105   Temp 97 F (36.1 C) (Oral)        Assessment & Plan:  1. Anxiety state *stress management Increased lexapro form 10mg  to 20mg  daily - ALPRAZolam (XANAX) 0.25 MG tablet; TAKE (1) TABLET BY MOUTH TWICE A DAY AS NEEDED.  Dispense: 60 tablet; Refill: 1 - escitalopram (LEXAPRO) 20 MG tablet; Take 1 tablet (20 mg total) by mouth daily.  Dispense: 30 tablet; Refill: 5  2. Acute recurrent maxillary sinusitis 1. Take meds as prescribed 2. Use a cool mist humidifier especially during the winter months and when heat has been humid. 3. Use saline nose sprays frequently 4. Saline irrigations of the nose can be very helpful if done frequently.  * 4X daily for 1 week*  * Use of a nettie pot can be helpful with this. Follow directions with this* 5. Drink plenty of fluids 6. Keep thermostat turn down low 7.For any cough or congestion  Use plain Mucinex- regular strength or max strength is fine   * Children- consult with Pharmacist for dosing 8. For fever or aces or pains- take tylenol or ibuprofen appropriate for age and weight.  *  for fevers greater than 101 orally you may alternate ibuprofen and tylenol every  3 hours.   - azithromycin (ZITHROMAX Z-PAK) 250 MG tablet; As directed  Dispense: 6 tablet; Refill: 0 - benzonatate (TESSALON PERLES) 100 MG capsule; Take 1 capsule (100 mg total) by mouth 3 (three) times daily as needed for cough.  Dispense: 20 capsule; Refill: 0   Mary-Margaret Hassell Done, FNP

## 2016-06-30 NOTE — Telephone Encounter (Signed)
Patient has been seen since [place]

## 2016-07-31 ENCOUNTER — Other Ambulatory Visit: Payer: Self-pay | Admitting: Nurse Practitioner

## 2016-09-15 ENCOUNTER — Telehealth: Payer: Self-pay | Admitting: Nurse Practitioner

## 2016-09-15 DIAGNOSIS — J0101 Acute recurrent maxillary sinusitis: Secondary | ICD-10-CM

## 2016-09-25 MED ORDER — AZITHROMYCIN 250 MG PO TABS: ORAL_TABLET | ORAL | 0 refills | Status: DC

## 2016-09-25 MED ORDER — BENZONATATE 200 MG PO CAPS: 200.0000 mg | ORAL_CAPSULE | Freq: Two times a day (BID) | ORAL | 0 refills | Status: DC | PRN

## 2016-09-25 NOTE — Telephone Encounter (Signed)
Patient called stating that she has a cough, congestion and sneezing with no fevers for almost 2 weeks.  Patient does not want to come in and risk getting the flu.  Would like a antibiotic sent to the pharmacy

## 2016-09-25 NOTE — Telephone Encounter (Signed)
Call in zithromax pak #1 take as directed. Tessalon perles 200 mg 1 TID for cough #30.

## 2016-09-25 NOTE — Telephone Encounter (Signed)
Medication sent to pharmacy  

## 2016-09-25 NOTE — Addendum Note (Signed)
Addended by: Wardell Heath on: 09/25/2016 09:57 AM   Modules accepted: Orders

## 2016-09-25 NOTE — Telephone Encounter (Signed)
Medication sent to pharmacy and appt made for patient to come in tomorrow.

## 2016-09-26 ENCOUNTER — Telehealth: Payer: Self-pay | Admitting: Nurse Practitioner

## 2016-09-26 ENCOUNTER — Ambulatory Visit: Payer: Medicare Other | Admitting: Physician Assistant

## 2016-10-02 ENCOUNTER — Encounter: Payer: Self-pay | Admitting: Nurse Practitioner

## 2016-10-03 ENCOUNTER — Other Ambulatory Visit: Payer: Self-pay | Admitting: Nurse Practitioner

## 2016-10-03 DIAGNOSIS — G114 Hereditary spastic paraplegia: Secondary | ICD-10-CM

## 2016-10-06 ENCOUNTER — Telehealth: Payer: Self-pay | Admitting: Nurse Practitioner

## 2016-10-06 NOTE — Telephone Encounter (Signed)
Patient scheduled appointment for 10/11/16 with MMM to discuss need for letter stating her niece drives her to doctor's appointments, grocery store, etc so that they can turn in to social services.

## 2016-10-11 ENCOUNTER — Encounter: Payer: Self-pay | Admitting: Nurse Practitioner

## 2016-10-11 ENCOUNTER — Ambulatory Visit (INDEPENDENT_AMBULATORY_CARE_PROVIDER_SITE_OTHER): Payer: Medicare Other | Admitting: Nurse Practitioner

## 2016-10-11 VITALS — BP 128/98 | HR 88 | Temp 97.4°F

## 2016-10-11 DIAGNOSIS — F411 Generalized anxiety disorder: Secondary | ICD-10-CM | POA: Diagnosis not present

## 2016-10-11 MED ORDER — ALPRAZOLAM 0.5 MG PO TABS: 0.5000 mg | ORAL_TABLET | Freq: Two times a day (BID) | ORAL | 2 refills | Status: DC | PRN

## 2016-10-11 NOTE — Progress Notes (Signed)
   Subjective:    Patient ID: Tiffany Stewart, female    DOB: [birth year], 46 y.o.   MRN: [id number]  HPI  Patient comes in today to get a letter for social services about who drives her to and from appointments. SHe also says that her anxiety meds are not working. She is on xanax 0.25mg  BID. Says that she stays stressed out. GAD 7 : Generalized Anxiety Score 10/11/2016 06/29/2016 03/07/2016  Nervous, Anxious, on Edge 3 2 2   Control/stop worrying 3 3 2   Worry too much - different things 3 3 2   Trouble relaxing 2 3 1   Restless 3 3 2   Easily annoyed or irritable 2 3 2   Afraid - awful might happen 1 3 1   Total GAD 7 Score 17 20 12   Anxiety Difficulty Somewhat difficult Very difficult Somewhat difficult       Review of Systems  Constitutional: Negative.   HENT: Negative.   Respiratory: Negative.   Cardiovascular: Negative.   Genitourinary: Negative.   Neurological: Negative.   Psychiatric/Behavioral: Negative.   All other systems reviewed and are negative.      Objective:   Physical Exam  Constitutional: She appears well-developed and well-nourished.  Cardiovascular: Normal rate and normal heart sounds.   Pulmonary/Chest: Effort normal and breath sounds normal.  Musculoskeletal:  Patient in wheel chair  Neurological: She is alert.  Skin: Skin is warm.    BP (!) 128/98   Pulse 88   Temp 97.4 F (36.3 C) (Oral)       Assessment & Plan:   1. GAD (generalized anxiety disorder)   stress management Increased xanax 0.5mg  BID  Meds ordered this encounter  Medications  . ALPRAZolam (XANAX) 0.5 MG tablet    Sig: Take 1 tablet (0.5 mg total) by mouth 2 (two) times daily as needed for anxiety.    Dispense:  60 tablet    Refill:  2    Order Specific Question:   Supervising Provider    Answer:   Eustaquio Maize [[id number]]   Letter written for social services  [place], FNP

## 2016-10-11 NOTE — Patient Instructions (Signed)
Stress and Stress Management Stress is a normal reaction to life events. It is what you feel when life demands more than you are used to or more than you can handle. Some stress can be useful. For example, the stress reaction can help you catch the last bus of the day, study for a test, or meet a deadline at work. But stress that occurs too often or for too long can cause problems. It can affect your emotional health and interfere with relationships and normal daily activities. Too much stress can weaken your immune system and increase your risk for physical illness. If you already have a medical problem, stress can make it worse. What are the causes? All sorts of life events may cause stress. An event that causes stress for one person may not be stressful for another person. Major life events commonly cause stress. These may be positive or negative. Examples include losing your job, moving into a new home, getting married, having a baby, or losing a loved one. Less obvious life events may also cause stress, especially if they occur day after day or in combination. Examples include working long hours, driving in traffic, caring for children, being in debt, or being in a difficult relationship. What are the signs or symptoms? Stress may cause emotional symptoms including, the following:  Anxiety. This is feeling worried, afraid, on edge, overwhelmed, or out of control.  Anger. This is feeling irritated or impatient.  Depression. This is feeling sad, down, helpless, or guilty.  Difficulty focusing, remembering, or making decisions. Stress may cause physical symptoms, including the following:  Aches and pains. These may affect your head, neck, back, stomach, or other areas of your body.  Tight muscles or clenched jaw.  Low energy or trouble sleeping. Stress may cause unhealthy behaviors, including the following:  Eating to feel better (overeating) or skipping meals.  Sleeping too little, too  much, or both.  Working too much or putting off tasks (procrastination).  Smoking, drinking alcohol, or using drugs to feel better. How is this diagnosed? Stress is diagnosed through an assessment by your health care provider. Your health care provider will ask questions about your symptoms and any stressful life events.Your health care provider will also ask about your medical history and may order blood tests or other tests. Certain medical conditions and medicine can cause physical symptoms similar to stress. Mental illness can cause emotional symptoms and unhealthy behaviors similar to stress. Your health care provider may refer you to a mental health professional for further evaluation. How is this treated? Stress management is the recommended treatment for stress.The goals of stress management are reducing stressful life events and coping with stress in healthy ways. Techniques for reducing stressful life events include the following:  Stress identification. Self-monitor for stress and identify what causes stress for you. These skills may help you to avoid some stressful events.  Time management. Set your priorities, keep a calendar of events, and learn to say "no." These tools can help you avoid making too many commitments. Techniques for coping with stress include the following:  Rethinking the problem. Try to think realistically about stressful events rather than ignoring them or overreacting. Try to find the positives in a stressful situation rather than focusing on the negatives.  Exercise. Physical exercise can release both physical and emotional tension. The key is to find a form of exercise you enjoy and do it regularly.  Relaxation techniques. These relax the body and mind. Examples include yoga,  meditation, tai chi, biofeedback, deep breathing, progressive muscle relaxation, listening to music, being out in nature, journaling, and other hobbies. Again, the key is to find one or  more that you enjoy and can do regularly.  Healthy lifestyle. Eat a balanced diet, get plenty of sleep, and do not smoke. Avoid using alcohol or drugs to relax.  Strong support network. Spend time with family, friends, or other people you enjoy being around.Express your feelings and talk things over with someone you trust. Counseling or talktherapy with a mental health professional may be helpful if you are having difficulty managing stress on your own. Medicine is typically not recommended for the treatment of stress.Talk to your health care provider if you think you need medicine for symptoms of stress. Follow these instructions at home:  Keep all follow-up visits as directed by your health care provider.  Take all medicines as directed by your health care provider. Contact a health care provider if:  Your symptoms get worse or you start having new symptoms.  You feel overwhelmed by your problems and can no longer manage them on your own. Get help right away if:  You feel like hurting yourself or someone else. This information is not intended to replace advice given to you by your health care provider. Make sure you discuss any questions you have with your health care provider. Document Released: 01/24/2001 Document Revised: 01/06/2016 Document Reviewed: 03/25/2013 Elsevier Interactive Patient Education  2017 [employer].

## 2016-10-16 ENCOUNTER — Ambulatory Visit: Payer: Medicare Other | Admitting: Nurse Practitioner

## 2016-10-29 ENCOUNTER — Emergency Department (HOSPITAL_COMMUNITY): Payer: Medicare Other

## 2016-10-29 ENCOUNTER — Emergency Department (HOSPITAL_COMMUNITY)
Admission: EM | Admit: 2016-10-29 | Discharge: 2016-10-30 | Disposition: A | Discharge status: Home / Self Care | Payer: Medicare Other | Attending: Emergency Medicine | Admitting: Emergency Medicine

## 2016-10-29 ENCOUNTER — Encounter (HOSPITAL_COMMUNITY): Payer: Self-pay | Admitting: Emergency Medicine

## 2016-10-29 DIAGNOSIS — D508 Other iron deficiency anemias: Secondary | ICD-10-CM | POA: Diagnosis not present

## 2016-10-29 DIAGNOSIS — M549 Dorsalgia, unspecified: Secondary | ICD-10-CM | POA: Insufficient documentation

## 2016-10-29 DIAGNOSIS — R531 Weakness: Secondary | ICD-10-CM | POA: Diagnosis not present

## 2016-10-29 DIAGNOSIS — Z79899 Other long term (current) drug therapy: Secondary | ICD-10-CM | POA: Diagnosis not present

## 2016-10-29 DIAGNOSIS — F17210 Nicotine dependence, cigarettes, uncomplicated: Secondary | ICD-10-CM | POA: Diagnosis not present

## 2016-10-29 DIAGNOSIS — R05 Cough: Secondary | ICD-10-CM | POA: Diagnosis not present

## 2016-10-29 DIAGNOSIS — R1033 Periumbilical pain: Secondary | ICD-10-CM | POA: Diagnosis not present

## 2016-10-29 DIAGNOSIS — R5383 Other fatigue: Secondary | ICD-10-CM | POA: Diagnosis present

## 2016-10-29 DIAGNOSIS — R110 Nausea: Secondary | ICD-10-CM | POA: Insufficient documentation

## 2016-10-29 LAB — CBC WITH DIFFERENTIAL/PLATELET
Basophils Absolute: 0 10*3/uL (ref 0.0–0.1)
Basophils Relative: 0 %
Eosinophils Absolute: 0.1 10*3/uL (ref 0.0–0.7)
Eosinophils Relative: 1 %
HEMATOCRIT: 26.9 % — AB (ref 36.0–46.0)
HEMOGLOBIN: 7.6 g/dL — AB (ref 12.0–15.0)
LYMPHS ABS: 1.9 10*3/uL (ref 0.7–4.0)
LYMPHS PCT: 19 %
MCH: 21.6 pg — AB (ref 26.0–34.0)
MCHC: 28.3 g/dL — ABNORMAL LOW (ref 30.0–36.0)
MCV: 76.4 fL — AB (ref 78.0–100.0)
MONOS PCT: 4 %
Monocytes Absolute: 0.4 10*3/uL (ref 0.1–1.0)
NEUTROS ABS: 7.5 10*3/uL (ref 1.7–7.7)
Neutrophils Relative %: 76 %
Platelets: 617 10*3/uL — ABNORMAL HIGH (ref 150–400)
RBC: 3.52 MIL/uL — AB (ref 3.87–5.11)
RDW: 20.5 % — ABNORMAL HIGH (ref 11.5–15.5)
WBC: 9.9 10*3/uL (ref 4.0–10.5)

## 2016-10-29 NOTE — ED Notes (Signed)
Pt is wet with urine, pt states she must have voided in her sleep. Pt states she has been on the road with her niece for the last three days looking for a place to rent

## 2016-10-29 NOTE — ED Provider Notes (Signed)
[facility] DEPT Provider Note   CSN: [id number] Arrival date & time: 10/29/16  2235     History   Chief Complaint Chief Complaint  Patient presents with  . Fatigue    x one month    HPI RAMONICA GRIGG is a 46 y.o. 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  She denies fevers, chills,  vomiting chest pain, sob, but does endorse nausea and generalized abdominal cramping.  She denies dysuria or diarrhea.  She last ate breakfast this am and has had no appetite the remainder of the day.  She cannot say how much weight she has lost, but she used to weigh 130 lbs.  (Per todays weight she is 130 today). She also endorses low back pain which is worse today and is currently having some muscle spasm in her lower back.    The history is provided by the patient.    Past Medical History:  Diagnosis Date  . Anxiety   . GERD (gastroesophageal reflux disease)   . Migraines     Patient Active Problem List   Diagnosis Date Noted  . HSP (hereditary spastic paraplegia) ([place]) 04/27/2014  . TOBACCO ABUSE 11/11/2009  . PARAPLEGIA 11/11/2009  . Anxiety state 03/06/2008  . GERD 03/06/2008    Past Surgical History:  Procedure Laterality Date  . CESAREAN SECTION    . CHOLECYSTECTOMY      OB History    No data available       Home Medications    Prior to Admission medications   Medication Sig Start Date End Date Taking? Authorizing Provider  ALPRAZolam Duanne Moron) 0.5 MG tablet Take 1 tablet (0.5 mg total) by mouth 2 (two) times daily as needed for anxiety. 10/11/16   Mary-Margaret Hassell Done, FNP  Aspirin-Acetaminophen-Caffeine (GOODY HEADACHE PO) Take 2 packets by mouth every 4 (four) hours as needed (Pain).    Historical Provider, MD  baclofen (LIORESAL) 10 MG tablet TAKE 1 TABLET BY MOUTH TWICE DAILY 03/07/16   Mary-Margaret Hassell Done, FNP    benzonatate (TESSALON) 200 MG capsule Take 1 capsule (200 mg total) by mouth 2 (two) times daily as needed for cough. 09/25/16   Terald Sleeper, PA-C  dicyclomine (BENTYL) 20 MG tablet Take 1 tablet (20 mg total) by mouth every 6 (six) hours. 03/07/16   Mary-Margaret Hassell Done, FNP  escitalopram (LEXAPRO) 20 MG tablet Take 1 tablet (20 mg total) by mouth daily. 06/29/16   Mary-Margaret Hassell Done, FNP  esomeprazole (NEXIUM) 20 MG capsule TAKE (1) TABLET BY MOUTH ONCE DAILY AT NOON. 07/31/16   Mary-Margaret Hassell Done, FNP  ferrous sulfate 325 (65 FE) MG tablet Take 1 tablet (325 mg total) by mouth daily. 10/30/16   Evalee Jefferson, PA-C  lisinopril (PRINIVIL,ZESTRIL) 10 MG tablet Take 1 tablet (10 mg total) by mouth daily. 03/07/16   Mary-Margaret Hassell Done, FNP  tiZANidine (ZANAFLEX) 4 MG tablet TAKE 1 TABLET BY MOUTH 3 TIMES DAILY. 10/05/16   Mary-Margaret Hassell Done, FNP    Family History Family History  Problem Relation Age of Onset  . Diabetes Father   . Cancer Father   . Heart disease Father   . Heart disease Maternal Grandmother   . Stroke Maternal Grandfather   . Diabetes Paternal Grandmother     Social History Social History  Substance Use Topics  . Smoking status: Current Every Day Smoker    Packs/day: 1.00  Years: 27.00    Types: Cigarettes  . Smokeless tobacco: Never Used  . Alcohol use No     Allergies   Hctz [hydrochlorothiazide]; Morphine; and Zoloft [sertraline hcl]   Review of Systems Review of Systems  Constitutional: Positive for appetite change and fatigue. Negative for fever.  HENT: Negative.  Negative for congestion and sore throat.   Eyes: Negative.   Respiratory: Negative for chest tightness and shortness of breath.   Cardiovascular: Negative for chest pain.  Gastrointestinal: Positive for abdominal pain and nausea. Negative for diarrhea and vomiting.  Genitourinary: Negative.   Musculoskeletal: Positive for back pain. Negative for arthralgias, joint swelling and neck  pain.  Skin: Negative.  Negative for rash and wound.  Neurological: Negative for dizziness, weakness, light-headedness, numbness and headaches.  Psychiatric/Behavioral: Negative.      Physical Exam Updated Vital Signs BP (!) 138/95   Pulse 76   Temp 97.9 F (36.6 C) (Oral)   Resp 20   Ht 5\' 5"  (1.651 m)   Wt 59 kg   SpO2 98%   BMI 21.63 kg/m   Physical Exam  Constitutional: She appears well-developed and well-nourished.  Patient smells strongly of old urine. Thin, unkempt.  HENT:  Head: Normocephalic and atraumatic.  Mouth/Throat: Oropharynx is clear and moist.  Eyes: Conjunctivae are normal.  Neck: Normal range of motion.  Cardiovascular: Normal rate, regular rhythm, normal heart sounds and intact distal pulses.   Pulmonary/Chest: Effort normal. She has decreased breath sounds. She has no wheezes. She has no rhonchi.  Poor effort.  Abdominal: Soft. Bowel sounds are normal. She exhibits no distension. There is tenderness in the periumbilical area. There is no rigidity, no rebound and no guarding.  Musculoskeletal: Normal range of motion.  Neurological: She is alert.  Skin: Skin is warm and dry.  Psychiatric: She has a normal mood and affect.  Nursing note and vitals reviewed.    ED Treatments / Results  Labs (all labs ordered are listed, but only abnormal results are displayed) Labs Reviewed  CBC WITH DIFFERENTIAL/PLATELET - Abnormal; Notable for the following:       Result Value   RBC 3.52 (*)    Hemoglobin 7.6 (*)    HCT 26.9 (*)    MCV 76.4 (*)    MCH 21.6 (*)    MCHC 28.3 (*)    RDW 20.5 (*)    Platelets 617 (*)    All other components within normal limits  URINALYSIS, ROUTINE W REFLEX MICROSCOPIC - Abnormal; Notable for the following:    Color, Urine STRAW (*)    All other components within normal limits  COMPREHENSIVE METABOLIC PANEL - Abnormal; Notable for the following:    Potassium 3.3 (*)    Creatinine, Ser 0.36 (*)    AST 12 (*)    ALT 7 (*)     Total Bilirubin 0.1 (*)    All other components within normal limits  RETICULOCYTES - Abnormal; Notable for the following:    RBC. 3.54 (*)    All other components within normal limits  TSH  VITAMIN B12  FOLATE  IRON AND TIBC  FERRITIN  T4, FREE  POC OCCULT BLOOD, ED    EKG  EKG Interpretation None       Radiology Dg Chest 2 View  Result Date: 10/30/2016 CLINICAL DATA:  46 year old female with cough and weakness. EXAM: CHEST  2 VIEW COMPARISON:  Chest radiograph dated 10/30/2016 FINDINGS: Minimal probable atelectasis of the left lung base. Developing infiltrate  is less likely but not excluded. Clinical correlation is recommended. There is no pleural effusion or pneumothorax. The cardiac silhouette is within normal limits. No acute osseous pathology. IMPRESSION: Minimal left lung base atelectasis versus less likely developing infiltrate. Clinical correlation is recommended. Electronically Signed   By: Anner Crete M.D.   On: 10/30/2016 01:25   Dg Chest Port 1 View  Result Date: 10/30/2016 CLINICAL DATA:  Weakness EXAM: PORTABLE CHEST 1 VIEW COMPARISON:  Chest radiograph 12/09/2004 FINDINGS: There are bibasilar linear opacities, likely atelectasis. Cardiomediastinal contours are normal. No pneumothorax or pleural effusion. IMPRESSION: Suspected bibasilar atelectasis. Developing consolidation may have the same appearance. If the patient is clinically able, upright PA and lateral radiographs may be helpful for differentiation. Electronically Signed   By: Ulyses Jarred M.D.   On: 10/30/2016 00:34    Procedures Procedures (including critical care time)  Medications Ordered in ED Medications - No data to display   Initial Impression / Assessment and Plan / ED Course  I have reviewed the triage vital signs and the nursing notes.  Pertinent labs & imaging results that were available during my care of the patient were reviewed by me and considered in my medical decision making  (see chart for details).     Discussed admission with patient for blood transfusion given symptomatic anemia.  Pt refuses admission at this time, stating she would rather f/u with her pcp and arrange outpatient tx.  Also suggested a home visit to assess for ways she may be assisted in her home with her medical needs/ may be eligible for home health aid assistance or other services.  She was not interested in this service. She was placed on oral iron supplement.  Advised to call pcp in the am to arrange ov and outpatient transfusion.  Pt agrees with plan.  Final Clinical Impressions(s) / ED Diagnoses   Final diagnoses:  Other iron deficiency anemia    New Prescriptions Discharge Medication List as of 10/30/2016  1:50 AM    START taking these medications   Details  ferrous sulfate 325 (65 FE) MG tablet Take 1 tablet (325 mg total) by mouth daily., Starting Mon 10/30/2016, Print         Evalee Jefferson, PA-C 75/44/92 0100    Delora Fuel, MD 71/21/97 [id number]

## 2016-10-29 NOTE — ED Triage Notes (Signed)
Followed by [facility] for complaint of N/V weight loss and malaise for one month  Dropped off by niece Kani Jobson who will return to bring home [phone]

## 2016-10-29 NOTE — ED Triage Notes (Signed)
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 She also states she has muscle spasms tonight with back pain- She smells of urine and is disheveled  She resides with her mother and reports that she is disabled

## 2016-10-30 ENCOUNTER — Emergency Department (HOSPITAL_COMMUNITY): Payer: Medicare Other

## 2016-10-30 DIAGNOSIS — D508 Other iron deficiency anemias: Secondary | ICD-10-CM | POA: Diagnosis not present

## 2016-10-30 DIAGNOSIS — R531 Weakness: Secondary | ICD-10-CM | POA: Diagnosis not present

## 2016-10-30 DIAGNOSIS — R05 Cough: Secondary | ICD-10-CM | POA: Diagnosis not present

## 2016-10-30 LAB — COMPREHENSIVE METABOLIC PANEL
ALBUMIN: 3.5 g/dL (ref 3.5–5.0)
ALT: 7 U/L — ABNORMAL LOW (ref 14–54)
ANION GAP: 10 (ref 5–15)
AST: 12 U/L — ABNORMAL LOW (ref 15–41)
Alkaline Phosphatase: 90 U/L (ref 38–126)
BILIRUBIN TOTAL: 0.1 mg/dL — AB (ref 0.3–1.2)
BUN: 14 mg/dL (ref 6–20)
CO2: 26 mmol/L (ref 22–32)
Calcium: 9.4 mg/dL (ref 8.9–10.3)
Chloride: 101 mmol/L (ref 101–111)
Creatinine, Ser: 0.36 mg/dL — ABNORMAL LOW (ref 0.44–1.00)
GFR calc Af Amer: >60 mL/min (ref >60)
GFR calc non Af Amer: >60 mL/min (ref >60)
Glucose, Bld: 97 mg/dL (ref 65–99)
POTASSIUM: 3.3 mmol/L — AB (ref 3.5–5.1)
SODIUM: 137 mmol/L (ref 135–145)
Total Protein: 7.8 g/dL (ref 6.5–8.1)

## 2016-10-30 LAB — T4, FREE: Free T4: 1.39 ng/dL — ABNORMAL HIGH (ref 0.61–1.12)

## 2016-10-30 LAB — FOLATE: FOLATE: 7.9 ng/mL (ref >5.9)

## 2016-10-30 LAB — URINALYSIS, ROUTINE W REFLEX MICROSCOPIC
BILIRUBIN URINE: NEGATIVE
Glucose, UA: NEGATIVE mg/dL
Hgb urine dipstick: NEGATIVE
KETONES UR: NEGATIVE mg/dL
Leukocytes, UA: NEGATIVE
NITRITE: NEGATIVE
PROTEIN: NEGATIVE mg/dL
Specific Gravity, Urine: 1.012 (ref 1.005–1.030)
pH: 7 (ref 5.0–8.0)

## 2016-10-30 LAB — IRON AND TIBC
IRON: 12 ug/dL — AB (ref 28–170)
Saturation Ratios: 2 % — ABNORMAL LOW (ref 10.4–31.8)
TIBC: 512 ug/dL — ABNORMAL HIGH (ref 250–450)
UIBC: 500 ug/dL

## 2016-10-30 LAB — RETICULOCYTES
RBC.: 3.54 MIL/uL — ABNORMAL LOW (ref 3.87–5.11)
RETIC COUNT ABSOLUTE: 81.4 10*3/uL (ref 19.0–186.0)
Retic Ct Pct: 2.3 % (ref 0.4–3.1)

## 2016-10-30 LAB — FERRITIN: Ferritin: 5 ng/mL — ABNORMAL LOW (ref 11–307)

## 2016-10-30 LAB — POC OCCULT BLOOD, ED: FECAL OCCULT BLD: NEGATIVE

## 2016-10-30 LAB — TSH: TSH: 0.429 u[IU]/mL (ref 0.350–4.500)

## 2016-10-30 LAB — VITAMIN B12: Vitamin B-12: 255 pg/mL (ref 180–914)

## 2016-10-30 MED ORDER — FERROUS SULFATE 325 (65 FE) MG PO TABS: 325.0000 mg | ORAL_TABLET | Freq: Every day | ORAL | 0 refills | Status: DC

## 2016-10-30 NOTE — ED Notes (Signed)
Family called they are in route to pick up pt. Patient given discharge instruction, verbalized understand. Pt dressed in blue scrubs and RN put in wheelchair to waiting area to meet family

## 2016-10-30 NOTE — ED Notes (Signed)
With assistance gave pt a bath and bed changed

## 2016-10-30 NOTE — ED Notes (Signed)
Pt had given wrong number for niece, called niece, left a message

## 2016-10-30 NOTE — ED Notes (Signed)
RN feels patient needs Education officer, museum, pt is hungry, gave a pack of nabs

## 2016-10-30 NOTE — ED Notes (Signed)
Lab will come draw more blood to complete add on test

## 2016-10-30 NOTE — ED Notes (Signed)
Pt calls out for bedpan, pt has already voided in bed and unaware

## 2016-10-30 NOTE — ED Notes (Signed)
Called niece for a ride home, went to answering service

## 2016-10-30 NOTE — ED Notes (Signed)
Pt gave another number to called her daughter, no answer

## 2016-10-31 ENCOUNTER — Other Ambulatory Visit: Payer: Self-pay | Admitting: Nurse Practitioner

## 2016-10-31 DIAGNOSIS — G114 Hereditary spastic paraplegia: Secondary | ICD-10-CM

## 2016-11-01 ENCOUNTER — Other Ambulatory Visit: Payer: Self-pay

## 2016-11-01 DIAGNOSIS — G114 Hereditary spastic paraplegia: Secondary | ICD-10-CM

## 2016-11-01 MED ORDER — TIZANIDINE HCL 4 MG PO TABS: 4.0000 mg | ORAL_TABLET | Freq: Three times a day (TID) | ORAL | 0 refills | Status: DC

## 2016-11-09 ENCOUNTER — Telehealth: Payer: Self-pay | Admitting: Nurse Practitioner

## 2016-11-10 ENCOUNTER — Other Ambulatory Visit: Payer: Self-pay | Admitting: Nurse Practitioner

## 2016-11-10 MED ORDER — PERMETHRIN 1 % EX LOTN: 1.0000 "application " | TOPICAL_LOTION | Freq: Once | CUTANEOUS | 0 refills | Status: DC

## 2016-11-10 MED ORDER — PERMETHRIN 1 % EX LOTN: 1.0000 "application " | TOPICAL_LOTION | Freq: Once | CUTANEOUS | 0 refills | Status: AC

## 2016-11-12 ENCOUNTER — Encounter (HOSPITAL_COMMUNITY): Payer: Self-pay | Admitting: *Deleted

## 2016-11-12 ENCOUNTER — Inpatient Hospital Stay (HOSPITAL_COMMUNITY)
Admission: EM | Admit: 2016-11-12 | Discharge: 2016-11-16 | DRG: 640 | Disposition: A | Discharge status: Home / Self Care | Payer: Medicare Other | Attending: Internal Medicine | Admitting: Internal Medicine

## 2016-11-12 DIAGNOSIS — R627 Adult failure to thrive: Secondary | ICD-10-CM

## 2016-11-12 DIAGNOSIS — Z809 Family history of malignant neoplasm, unspecified: Secondary | ICD-10-CM

## 2016-11-12 DIAGNOSIS — G43909 Migraine, unspecified, not intractable, without status migrainosus: Secondary | ICD-10-CM | POA: Diagnosis present

## 2016-11-12 DIAGNOSIS — I10 Essential (primary) hypertension: Secondary | ICD-10-CM | POA: Diagnosis present

## 2016-11-12 DIAGNOSIS — Z993 Dependence on wheelchair: Secondary | ICD-10-CM

## 2016-11-12 DIAGNOSIS — F411 Generalized anxiety disorder: Secondary | ICD-10-CM | POA: Diagnosis present

## 2016-11-12 DIAGNOSIS — Z885 Allergy status to narcotic agent status: Secondary | ICD-10-CM

## 2016-11-12 DIAGNOSIS — F17210 Nicotine dependence, cigarettes, uncomplicated: Secondary | ICD-10-CM | POA: Diagnosis present

## 2016-11-12 DIAGNOSIS — D509 Iron deficiency anemia, unspecified: Secondary | ICD-10-CM

## 2016-11-12 DIAGNOSIS — F329 Major depressive disorder, single episode, unspecified: Secondary | ICD-10-CM | POA: Diagnosis present

## 2016-11-12 DIAGNOSIS — E43 Unspecified severe protein-calorie malnutrition: Secondary | ICD-10-CM | POA: Diagnosis not present

## 2016-11-12 DIAGNOSIS — R531 Weakness: Secondary | ICD-10-CM

## 2016-11-12 DIAGNOSIS — Z87891 Personal history of nicotine dependence: Secondary | ICD-10-CM

## 2016-11-12 DIAGNOSIS — Z888 Allergy status to other drugs, medicaments and biological substances status: Secondary | ICD-10-CM

## 2016-11-12 DIAGNOSIS — R0789 Other chest pain: Secondary | ICD-10-CM

## 2016-11-12 DIAGNOSIS — Z6821 Body mass index (BMI) 21.0-21.9, adult: Secondary | ICD-10-CM

## 2016-11-12 DIAGNOSIS — E876 Hypokalemia: Secondary | ICD-10-CM | POA: Diagnosis not present

## 2016-11-12 DIAGNOSIS — T39395A Adverse effect of other nonsteroidal anti-inflammatory drugs [NSAID], initial encounter: Secondary | ICD-10-CM | POA: Diagnosis present

## 2016-11-12 DIAGNOSIS — K255 Chronic or unspecified gastric ulcer with perforation: Secondary | ICD-10-CM | POA: Diagnosis present

## 2016-11-12 DIAGNOSIS — G114 Hereditary spastic paraplegia: Secondary | ICD-10-CM | POA: Diagnosis not present

## 2016-11-12 DIAGNOSIS — G8929 Other chronic pain: Secondary | ICD-10-CM | POA: Diagnosis present

## 2016-11-12 DIAGNOSIS — R109 Unspecified abdominal pain: Secondary | ICD-10-CM

## 2016-11-12 DIAGNOSIS — Z823 Family history of stroke: Secondary | ICD-10-CM

## 2016-11-12 DIAGNOSIS — R1084 Generalized abdominal pain: Secondary | ICD-10-CM

## 2016-11-12 DIAGNOSIS — Z833 Family history of diabetes mellitus: Secondary | ICD-10-CM

## 2016-11-12 DIAGNOSIS — R32 Unspecified urinary incontinence: Secondary | ICD-10-CM | POA: Diagnosis present

## 2016-11-12 DIAGNOSIS — Z8249 Family history of ischemic heart disease and other diseases of the circulatory system: Secondary | ICD-10-CM

## 2016-11-12 DIAGNOSIS — Z9049 Acquired absence of other specified parts of digestive tract: Secondary | ICD-10-CM

## 2016-11-12 DIAGNOSIS — K210 Gastro-esophageal reflux disease with esophagitis: Secondary | ICD-10-CM | POA: Diagnosis present

## 2016-11-12 DIAGNOSIS — K219 Gastro-esophageal reflux disease without esophagitis: Secondary | ICD-10-CM | POA: Diagnosis present

## 2016-11-12 HISTORY — DX: Hereditary spastic paraplegia: G11.4

## 2016-11-12 HISTORY — DX: Other chronic pain: G89.29

## 2016-11-12 HISTORY — DX: Major depressive disorder, single episode, unspecified: F32.9

## 2016-11-12 HISTORY — DX: Depression, unspecified: F32.A

## 2016-11-12 NOTE — ED Notes (Signed)
Pt resting with eyes closed- appears sleeping- warm blanket applied Awaiting eval

## 2016-11-12 NOTE — ED Triage Notes (Signed)
Pt has been having severe muscle cramps since last night. (worse than usual) Per niece, pt has been incontinent of stool and urine today. Niece states that the pt's urine had an odor to it.

## 2016-11-12 NOTE — ED Notes (Signed)
PLEASE CALL WAYNE STEWART OR ALICIA Braun IF PT IS ADMITTED OR IS DISCHARGED.  1. [place] UP 2. IF NO Gilman Buttner AT [phone]

## 2016-11-13 ENCOUNTER — Inpatient Hospital Stay (HOSPITAL_COMMUNITY): Payer: Medicare Other

## 2016-11-13 DIAGNOSIS — Z833 Family history of diabetes mellitus: Secondary | ICD-10-CM | POA: Diagnosis not present

## 2016-11-13 DIAGNOSIS — K219 Gastro-esophageal reflux disease without esophagitis: Secondary | ICD-10-CM | POA: Diagnosis present

## 2016-11-13 DIAGNOSIS — D509 Iron deficiency anemia, unspecified: Secondary | ICD-10-CM

## 2016-11-13 DIAGNOSIS — Z885 Allergy status to narcotic agent status: Secondary | ICD-10-CM | POA: Diagnosis not present

## 2016-11-13 DIAGNOSIS — Z9049 Acquired absence of other specified parts of digestive tract: Secondary | ICD-10-CM | POA: Diagnosis not present

## 2016-11-13 DIAGNOSIS — G8929 Other chronic pain: Secondary | ICD-10-CM | POA: Diagnosis present

## 2016-11-13 DIAGNOSIS — K257 Chronic gastric ulcer without hemorrhage or perforation: Secondary | ICD-10-CM | POA: Diagnosis not present

## 2016-11-13 DIAGNOSIS — K255 Chronic or unspecified gastric ulcer with perforation: Secondary | ICD-10-CM | POA: Diagnosis present

## 2016-11-13 DIAGNOSIS — Z823 Family history of stroke: Secondary | ICD-10-CM | POA: Diagnosis not present

## 2016-11-13 DIAGNOSIS — R1084 Generalized abdominal pain: Secondary | ICD-10-CM

## 2016-11-13 DIAGNOSIS — E43 Unspecified severe protein-calorie malnutrition: Secondary | ICD-10-CM | POA: Diagnosis not present

## 2016-11-13 DIAGNOSIS — R531 Weakness: Secondary | ICD-10-CM | POA: Diagnosis not present

## 2016-11-13 DIAGNOSIS — K251 Acute gastric ulcer with perforation: Secondary | ICD-10-CM | POA: Diagnosis not present

## 2016-11-13 DIAGNOSIS — K259 Gastric ulcer, unspecified as acute or chronic, without hemorrhage or perforation: Secondary | ICD-10-CM | POA: Diagnosis not present

## 2016-11-13 DIAGNOSIS — T39395A Adverse effect of other nonsteroidal anti-inflammatory drugs [NSAID], initial encounter: Secondary | ICD-10-CM | POA: Diagnosis present

## 2016-11-13 DIAGNOSIS — R627 Adult failure to thrive: Secondary | ICD-10-CM | POA: Diagnosis present

## 2016-11-13 DIAGNOSIS — F17210 Nicotine dependence, cigarettes, uncomplicated: Secondary | ICD-10-CM | POA: Diagnosis present

## 2016-11-13 DIAGNOSIS — N3289 Other specified disorders of bladder: Secondary | ICD-10-CM | POA: Diagnosis not present

## 2016-11-13 DIAGNOSIS — E876 Hypokalemia: Secondary | ICD-10-CM | POA: Diagnosis not present

## 2016-11-13 DIAGNOSIS — I10 Essential (primary) hypertension: Secondary | ICD-10-CM | POA: Diagnosis present

## 2016-11-13 DIAGNOSIS — D508 Other iron deficiency anemias: Secondary | ICD-10-CM

## 2016-11-13 DIAGNOSIS — F411 Generalized anxiety disorder: Secondary | ICD-10-CM | POA: Diagnosis not present

## 2016-11-13 DIAGNOSIS — Z993 Dependence on wheelchair: Secondary | ICD-10-CM | POA: Diagnosis not present

## 2016-11-13 DIAGNOSIS — K210 Gastro-esophageal reflux disease with esophagitis: Secondary | ICD-10-CM | POA: Diagnosis present

## 2016-11-13 DIAGNOSIS — R0789 Other chest pain: Secondary | ICD-10-CM

## 2016-11-13 DIAGNOSIS — Z87891 Personal history of nicotine dependence: Secondary | ICD-10-CM | POA: Diagnosis not present

## 2016-11-13 DIAGNOSIS — R109 Unspecified abdominal pain: Secondary | ICD-10-CM | POA: Diagnosis not present

## 2016-11-13 DIAGNOSIS — G114 Hereditary spastic paraplegia: Secondary | ICD-10-CM

## 2016-11-13 DIAGNOSIS — F329 Major depressive disorder, single episode, unspecified: Secondary | ICD-10-CM | POA: Diagnosis present

## 2016-11-13 DIAGNOSIS — R32 Unspecified urinary incontinence: Secondary | ICD-10-CM | POA: Diagnosis present

## 2016-11-13 DIAGNOSIS — Z8249 Family history of ischemic heart disease and other diseases of the circulatory system: Secondary | ICD-10-CM | POA: Diagnosis not present

## 2016-11-13 DIAGNOSIS — Z888 Allergy status to other drugs, medicaments and biological substances status: Secondary | ICD-10-CM | POA: Diagnosis not present

## 2016-11-13 DIAGNOSIS — Z809 Family history of malignant neoplasm, unspecified: Secondary | ICD-10-CM | POA: Diagnosis not present

## 2016-11-13 LAB — BASIC METABOLIC PANEL
Anion gap: 10 (ref 5–15)
BUN: 6 mg/dL (ref 6–20)
CALCIUM: 8.5 mg/dL — AB (ref 8.9–10.3)
CO2: 27 mmol/L (ref 22–32)
CREATININE: 0.46 mg/dL (ref 0.44–1.00)
Chloride: 101 mmol/L (ref 101–111)
GFR calc non Af Amer: >60 mL/min (ref >60)
GLUCOSE: 115 mg/dL — AB (ref 65–99)
Potassium: 3.1 mmol/L — ABNORMAL LOW (ref 3.5–5.1)
Sodium: 138 mmol/L (ref 135–145)

## 2016-11-13 LAB — COMPREHENSIVE METABOLIC PANEL
ALT: 8 U/L — AB (ref 14–54)
AST: 17 U/L (ref 15–41)
Albumin: 3.7 g/dL (ref 3.5–5.0)
Alkaline Phosphatase: 81 U/L (ref 38–126)
Anion gap: 13 (ref 5–15)
BUN: 15 mg/dL (ref 6–20)
CHLORIDE: 99 mmol/L — AB (ref 101–111)
CO2: 29 mmol/L (ref 22–32)
CREATININE: 0.44 mg/dL (ref 0.44–1.00)
Calcium: 9.3 mg/dL (ref 8.9–10.3)
GFR calc non Af Amer: >60 mL/min (ref >60)
Glucose, Bld: 125 mg/dL — ABNORMAL HIGH (ref 65–99)
POTASSIUM: 2.5 mmol/L — AB (ref 3.5–5.1)
Sodium: 141 mmol/L (ref 135–145)
TOTAL PROTEIN: 7.8 g/dL (ref 6.5–8.1)
Total Bilirubin: 0.3 mg/dL (ref 0.3–1.2)

## 2016-11-13 LAB — CBC WITH DIFFERENTIAL/PLATELET
Basophils Absolute: 0 10*3/uL (ref 0.0–0.1)
Basophils Relative: 0 %
EOS ABS: 0 10*3/uL (ref 0.0–0.7)
Eosinophils Relative: 0 %
HCT: 29 % — ABNORMAL LOW (ref 36.0–46.0)
HEMOGLOBIN: 8.3 g/dL — AB (ref 12.0–15.0)
Lymphocytes Relative: 7 %
Lymphs Abs: 0.9 10*3/uL (ref 0.7–4.0)
MCH: 22.1 pg — AB (ref 26.0–34.0)
MCHC: 28.6 g/dL — ABNORMAL LOW (ref 30.0–36.0)
MCV: 77.3 fL — ABNORMAL LOW (ref 78.0–100.0)
Monocytes Absolute: 0.5 10*3/uL (ref 0.1–1.0)
Monocytes Relative: 3 %
NEUTROS PCT: 90 %
Neutro Abs: 12.5 10*3/uL — ABNORMAL HIGH (ref 1.7–7.7)
Platelets: 729 10*3/uL — ABNORMAL HIGH (ref 150–400)
RBC: 3.75 MIL/uL — AB (ref 3.87–5.11)
RDW: 21.1 % — ABNORMAL HIGH (ref 11.5–15.5)
WBC: 13.9 10*3/uL — AB (ref 4.0–10.5)

## 2016-11-13 LAB — CK: Total CK: 176 U/L (ref 38–234)

## 2016-11-13 LAB — URINALYSIS, ROUTINE W REFLEX MICROSCOPIC
BILIRUBIN URINE: NEGATIVE
Glucose, UA: NEGATIVE mg/dL
HGB URINE DIPSTICK: NEGATIVE
KETONES UR: 5 mg/dL — AB
Leukocytes, UA: NEGATIVE
NITRITE: NEGATIVE
PROTEIN: NEGATIVE mg/dL
SPECIFIC GRAVITY, URINE: 1.017 (ref 1.005–1.030)
pH: 5 (ref 5.0–8.0)

## 2016-11-13 LAB — TROPONIN I
Troponin I: <0.03 ng/mL (ref <0.03)
Troponin I: <0.03 ng/mL (ref <0.03)

## 2016-11-13 LAB — MAGNESIUM: Magnesium: 1.9 mg/dL (ref 1.7–2.4)

## 2016-11-13 LAB — LACTIC ACID, PLASMA
LACTIC ACID, VENOUS: 0.9 mmol/L (ref 0.5–1.9)
LACTIC ACID, VENOUS: 0.9 mmol/L (ref 0.5–1.9)

## 2016-11-13 LAB — LIPASE, BLOOD: Lipase: 17 U/L (ref 11–51)

## 2016-11-13 MED ORDER — PIPERACILLIN-TAZOBACTAM 3.375 G IVPB
3.3750 g | Freq: Three times a day (TID) | INTRAVENOUS | Status: DC
  Administered 2016-11-14 – 2016-11-16 (×7): 3.375 g via INTRAVENOUS
  Filled 2016-11-13 (×7): qty 50

## 2016-11-13 MED ORDER — MAGNESIUM SULFATE 2 GM/50ML IV SOLN
2.0000 g | Freq: Once | INTRAVENOUS | Status: AC
  Administered 2016-11-13: 2 g via INTRAVENOUS
  Filled 2016-11-13: qty 50

## 2016-11-13 MED ORDER — POTASSIUM CHLORIDE CRYS ER 20 MEQ PO TBCR
40.0000 meq | EXTENDED_RELEASE_TABLET | Freq: Every day | ORAL | Status: DC
  Administered 2016-11-13 – 2016-11-14 (×2): 40 meq via ORAL
  Filled 2016-11-13 (×2): qty 2

## 2016-11-13 MED ORDER — LISINOPRIL 10 MG PO TABS
10.0000 mg | ORAL_TABLET | Freq: Every day | ORAL | Status: DC
  Administered 2016-11-13 – 2016-11-14 (×2): 10 mg via ORAL
  Filled 2016-11-13 (×2): qty 1

## 2016-11-13 MED ORDER — OXYCODONE-ACETAMINOPHEN 5-325 MG PO TABS: 2.0000 | ORAL_TABLET | Freq: Once | ORAL | Status: DC

## 2016-11-13 MED ORDER — SODIUM CHLORIDE 0.9 % IV BOLUS (SEPSIS)
1000.0000 mL | Freq: Once | INTRAVENOUS | Status: AC
  Administered 2016-11-13: 1000 mL via INTRAVENOUS

## 2016-11-13 MED ORDER — IOPAMIDOL (ISOVUE-300) INJECTION 61%
100.0000 mL | Freq: Once | INTRAVENOUS | Status: AC | PRN
  Administered 2016-11-13: 100 mL via INTRAVENOUS

## 2016-11-13 MED ORDER — ESCITALOPRAM OXALATE 10 MG PO TABS
20.0000 mg | ORAL_TABLET | Freq: Every day | ORAL | Status: DC
  Administered 2016-11-13 – 2016-11-14 (×2): 20 mg via ORAL
  Filled 2016-11-13 (×2): qty 2

## 2016-11-13 MED ORDER — PANTOPRAZOLE SODIUM 40 MG IV SOLR
80.0000 mg | Freq: Once | INTRAVENOUS | Status: AC
  Administered 2016-11-13: 80 mg via INTRAVENOUS
  Filled 2016-11-13: qty 80

## 2016-11-13 MED ORDER — MORPHINE SULFATE (PF) 2 MG/ML IV SOLN
1.0000 mg | INTRAVENOUS | Status: DC | PRN
  Administered 2016-11-14 – 2016-11-16 (×14): 1 mg via INTRAVENOUS
  Filled 2016-11-13 (×14): qty 1

## 2016-11-13 MED ORDER — SODIUM CHLORIDE 0.9 % IV SOLN
510.0000 mg | Freq: Once | INTRAVENOUS | Status: AC
  Administered 2016-11-13: 510 mg via INTRAVENOUS
  Filled 2016-11-13: qty 17

## 2016-11-13 MED ORDER — PANTOPRAZOLE SODIUM 40 MG PO TBEC
40.0000 mg | DELAYED_RELEASE_TABLET | Freq: Every day | ORAL | Status: DC
  Administered 2016-11-13 – 2016-11-14 (×2): 40 mg via ORAL
  Filled 2016-11-13 (×2): qty 1

## 2016-11-13 MED ORDER — SODIUM CHLORIDE 0.9 % IV SOLN
8.0000 mg/h | INTRAVENOUS | Status: DC
  Administered 2016-11-13 – 2016-11-16 (×5): 8 mg/h via INTRAVENOUS
  Filled 2016-11-13 (×8): qty 80

## 2016-11-13 MED ORDER — SODIUM CHLORIDE 0.9 % IV BOLUS (SEPSIS)
500.0000 mL | Freq: Once | INTRAVENOUS | Status: AC
  Administered 2016-11-13: 500 mL via INTRAVENOUS

## 2016-11-13 MED ORDER — ENOXAPARIN SODIUM 40 MG/0.4ML ~~LOC~~ SOLN
40.0000 mg | SUBCUTANEOUS | Status: DC
  Administered 2016-11-13 – 2016-11-16 (×4): 40 mg via SUBCUTANEOUS
  Filled 2016-11-13 (×4): qty 0.4

## 2016-11-13 MED ORDER — POTASSIUM CHLORIDE 10 MEQ/100ML IV SOLN
10.0000 meq | INTRAVENOUS | Status: AC
  Administered 2016-11-13 (×3): 10 meq via INTRAVENOUS
  Filled 2016-11-13 (×2): qty 100

## 2016-11-13 MED ORDER — VITAMIN C 500 MG PO TABS
500.0000 mg | ORAL_TABLET | Freq: Three times a day (TID) | ORAL | Status: DC
  Administered 2016-11-13 – 2016-11-14 (×4): 500 mg via ORAL
  Filled 2016-11-13 (×5): qty 1

## 2016-11-13 MED ORDER — POTASSIUM CHLORIDE 10 MEQ/100ML IV SOLN
10.0000 meq | INTRAVENOUS | Status: AC
  Administered 2016-11-13 (×3): 10 meq via INTRAVENOUS
  Filled 2016-11-13 (×4): qty 100

## 2016-11-13 MED ORDER — DICYCLOMINE HCL 10 MG PO CAPS
20.0000 mg | ORAL_CAPSULE | Freq: Four times a day (QID) | ORAL | Status: DC
  Administered 2016-11-13 – 2016-11-14 (×5): 20 mg via ORAL
  Filled 2016-11-13 (×5): qty 2

## 2016-11-13 MED ORDER — FERROUS SULFATE 325 (65 FE) MG PO TABS
325.0000 mg | ORAL_TABLET | Freq: Three times a day (TID) | ORAL | Status: DC
  Administered 2016-11-13 (×2): 325 mg via ORAL
  Filled 2016-11-13 (×2): qty 1

## 2016-11-13 MED ORDER — BOOST / RESOURCE BREEZE PO LIQD
1.0000 | Freq: Three times a day (TID) | ORAL | Status: DC
  Administered 2016-11-13 – 2016-11-14 (×3): 1 via ORAL

## 2016-11-13 MED ORDER — IOPAMIDOL (ISOVUE-300) INJECTION 61%
INTRAVENOUS | Status: AC
  Filled 2016-11-13: qty 30

## 2016-11-13 MED ORDER — VITAMIN B-12 100 MCG PO TABS
250.0000 ug | ORAL_TABLET | Freq: Every day | ORAL | Status: DC
  Administered 2016-11-14: 250 ug via ORAL
  Filled 2016-11-13: qty 3

## 2016-11-13 MED ORDER — TIZANIDINE HCL 4 MG PO TABS
4.0000 mg | ORAL_TABLET | Freq: Three times a day (TID) | ORAL | Status: DC
  Administered 2016-11-13 – 2016-11-14 (×4): 4 mg via ORAL
  Filled 2016-11-13 (×4): qty 1

## 2016-11-13 MED ORDER — PANTOPRAZOLE SODIUM 40 MG IV SOLR: 40.0000 mg | Freq: Two times a day (BID) | INTRAVENOUS | Status: DC

## 2016-11-13 MED ORDER — ALPRAZOLAM 0.5 MG PO TABS
0.5000 mg | ORAL_TABLET | Freq: Two times a day (BID) | ORAL | Status: DC | PRN
Start: 2016-11-13 — End: 2016-11-16
  Administered 2016-11-13 – 2016-11-15 (×2): 0.5 mg via ORAL
  Filled 2016-11-13 (×2): qty 1

## 2016-11-13 MED ORDER — KCL IN DEXTROSE-NACL 40-5-0.9 MEQ/L-%-% IV SOLN
INTRAVENOUS | Status: DC
  Administered 2016-11-13: 23:00:00 via INTRAVENOUS

## 2016-11-13 MED ORDER — PANTOPRAZOLE SODIUM 40 MG IV SOLR
INTRAVENOUS | Status: AC
  Filled 2016-11-13: qty 160

## 2016-11-13 NOTE — ED Provider Notes (Signed)
[facility] DEPT Provider Note   CSN: [id number] Arrival date & time: 11/12/16  2150   By signing my name below, I, [name], attest that this documentation has been prepared under the direction and in the presence of Rolland Porter, MD. Electronically Signed: [name], Scribe. 11/13/16. 1:01 AM.  Time seen 12:50 AM  History   Chief Complaint Chief Complaint  Patient presents with  . generalized weakness    LEVEL 5 CAVEAT: Altered Mental Status  The history is provided by a relative. No language interpreter was used.  HPI Comments: Tiffany Stewart is a 46 y.o. female who presents to the Emergency Department with altered mental status since earlier today. Per Niece: The patient has had severe muscle crams since last night. The patient has also been incontinent of stool and urine today. The patient urine had an odor to it per niece. Pt is only making grunting noises and is not verbal to questions. She doesn't answer even yes or no questions.    PCP Mary-Margaret Hassell Done, FNP   Past Medical History:  Diagnosis Date  . Anxiety   . Chronic pain   . Depression   . GERD (gastroesophageal reflux disease)   . HSP (hereditary spastic paraplegia) ([place])   . Migraines     Patient Active Problem List   Diagnosis Date Noted  . Hypokalemia 11/13/2016  . Iron deficiency anemia 11/13/2016  . HSP (hereditary spastic paraplegia) ([place]) 04/27/2014  . TOBACCO ABUSE 11/11/2009  . PARAPLEGIA 11/11/2009  . Anxiety state 03/06/2008  . GERD 03/06/2008    Past Surgical History:  Procedure Laterality Date  . CESAREAN SECTION    . CHOLECYSTECTOMY      OB History    No data available       Home Medications    Prior to Admission medications   Medication Sig Start Date End Date Taking? Authorizing Provider  ALPRAZolam Duanne Moron) 0.5 MG tablet Take 1 tablet (0.5 mg total) by mouth 2 (two) times daily as needed for anxiety. 10/11/16   Mary-Margaret Hassell Done, FNP    Aspirin-Acetaminophen-Caffeine (GOODY HEADACHE PO) Take 2 packets by mouth every 4 (four) hours as needed (Pain).    Historical Provider, MD  baclofen (LIORESAL) 10 MG tablet TAKE 1 TABLET BY MOUTH TWICE DAILY 03/07/16   Mary-Margaret Hassell Done, FNP  benzonatate (TESSALON) 200 MG capsule Take 1 capsule (200 mg total) by mouth 2 (two) times daily as needed for cough. 09/25/16   Terald Sleeper, PA-C  dicyclomine (BENTYL) 20 MG tablet Take 1 tablet (20 mg total) by mouth every 6 (six) hours. 03/07/16   Mary-Margaret Hassell Done, FNP  escitalopram (LEXAPRO) 20 MG tablet Take 1 tablet (20 mg total) by mouth daily. 06/29/16   Mary-Margaret Hassell Done, FNP  esomeprazole (NEXIUM) 20 MG capsule TAKE (1) TABLET BY MOUTH ONCE DAILY AT NOON. 07/31/16   Mary-Margaret Hassell Done, FNP  ferrous sulfate 325 (65 FE) MG tablet Take 1 tablet (325 mg total) by mouth daily. 10/30/16   Evalee Jefferson, PA-C  lisinopril (PRINIVIL,ZESTRIL) 10 MG tablet Take 1 tablet (10 mg total) by mouth daily. 03/07/16   Mary-Margaret Hassell Done, FNP  tiZANidine (ZANAFLEX) 4 MG tablet Take 1 tablet (4 mg total) by mouth 3 (three) times daily. 11/01/16   Mary-Margaret Hassell Done, FNP    Family History Family History  Problem Relation Age of Onset  . Diabetes Father   . Cancer Father   . Heart disease Father   . Heart disease Maternal Grandmother   . Stroke Maternal Grandfather   .  Diabetes Paternal Grandmother     Social History Social History  Substance Use Topics  . Smoking status: Current Every Day Smoker    Packs/day: 1.00    Years: 27.00    Types: Cigarettes  . Smokeless tobacco: Never Used  . Alcohol use No     Allergies   Hctz [hydrochlorothiazide]; Morphine; and Zoloft [sertraline hcl]   Review of Systems Review of Systems  Unable to perform ROS: Mental status change    Physical Exam Updated Vital Signs BP (!) 162/98   Pulse 80   Temp 98 F (36.7 C) (Oral)   Resp 17   Ht 5\' 5"  (1.651 m)   Wt 130 lb (59 kg)   SpO2 95%   BMI 21.63  kg/m   Vital signs normal except for hypertension   Physical Exam  Constitutional: She appears well-developed and well-nourished.  Non-toxic appearance. She does not appear ill. No distress.  She just grunts and doesn't answer yes/no questions.   HENT:  Head: Normocephalic and atraumatic.  Right Ear: External ear normal.  Left Ear: External ear normal.  Nose: Nose normal. No mucosal edema or rhinorrhea.  Mouth/Throat: Oropharynx is clear and moist and mucous membranes are normal. No dental abscesses or uvula swelling.  Eyes: Conjunctivae and EOM are normal. Pupils are equal, round, and reactive to light.  Neck: Normal range of motion and full passive range of motion without pain. Neck supple.  Cardiovascular: Normal rate, regular rhythm and normal heart sounds.  Exam reveals no gallop and no friction rub.   No murmur heard. Pulmonary/Chest: Effort normal and breath sounds normal. No respiratory distress. She has no wheezes. She has no rhonchi. She has no rales. She exhibits no tenderness and no crepitus.  Abdominal: Soft. Normal appearance and bowel sounds are normal. She exhibits no distension. There is no tenderness. There is no rebound and no guarding.  Musculoskeletal: Normal range of motion. She exhibits no edema or tenderness.  Moves all extremities well.   Neurological: She has normal strength. No cranial nerve deficit.  Grips are equal. She is able to follow simple commands.   Skin: Skin is warm, dry and intact. No rash noted. No erythema. No pallor.  Psychiatric: She has a normal mood and affect. Her behavior is normal. Her mood appears not anxious. She is communicative.  Nursing note and vitals reviewed.   ED Treatments / Results  DIAGNOSTIC STUDIES: Oxygen Saturation is 95% on RA, adequate by my interpretation.    Labs (all labs ordered are listed, but only abnormal results are displayed) Results for orders placed or performed during the hospital encounter of 11/12/16    Comprehensive metabolic panel  Result Value Ref Range   Sodium 141 135 - 145 mmol/L   Potassium 2.5 (LL) 3.5 - 5.1 mmol/L   Chloride 99 (L) 101 - 111 mmol/L   CO2 29 22 - 32 mmol/L   Glucose, Bld 125 (H) 65 - 99 mg/dL   BUN 15 6 - 20 mg/dL   Creatinine, Ser 0.44 0.44 - 1.00 mg/dL   Calcium 9.3 8.9 - 10.3 mg/dL   Total Protein 7.8 6.5 - 8.1 g/dL   Albumin 3.7 3.5 - 5.0 g/dL   AST 17 15 - 41 U/L   ALT 8 (L) 14 - 54 U/L   Alkaline Phosphatase 81 38 - 126 U/L   Total Bilirubin 0.3 0.3 - 1.2 mg/dL   GFR calc non Af Amer >60 >60 mL/min   GFR calc Af Amer >  60 >60 mL/min   Anion gap 13 5 - 15  CBC with Differential  Result Value Ref Range   WBC 13.9 (H) 4.0 - 10.5 K/uL   RBC 3.75 (L) 3.87 - 5.11 MIL/uL   Hemoglobin 8.3 (L) 12.0 - 15.0 g/dL   HCT 29.0 (L) 36.0 - 46.0 %   MCV 77.3 (L) 78.0 - 100.0 fL   MCH 22.1 (L) 26.0 - 34.0 pg   MCHC 28.6 (L) 30.0 - 36.0 g/dL   RDW 21.1 (H) 11.5 - 15.5 %   Platelets 729 (H) 150 - 400 K/uL   Neutrophils Relative % 90 %   Neutro Abs 12.5 (H) 1.7 - 7.7 K/uL   Lymphocytes Relative 7 %   Lymphs Abs 0.9 0.7 - 4.0 K/uL   Monocytes Relative 3 %   Monocytes Absolute 0.5 0.1 - 1.0 K/uL   Eosinophils Relative 0 %   Eosinophils Absolute 0.0 0.0 - 0.7 K/uL   Basophils Relative 0 %   Basophils Absolute 0.0 0.0 - 0.1 K/uL  Lactic acid, plasma  Result Value Ref Range   Lactic Acid, Venous 0.9 0.5 - 1.9 mmol/L  Lactic acid, plasma  Result Value Ref Range   Lactic Acid, Venous 0.9 0.5 - 1.9 mmol/L  CK  Result Value Ref Range   Total CK 176 38 - 234 U/L  Urinalysis, Routine w reflex microscopic  Result Value Ref Range   Color, Urine YELLOW YELLOW   APPearance CLEAR CLEAR   Specific Gravity, Urine 1.017 1.005 - 1.030   pH 5.0 5.0 - 8.0   Glucose, UA NEGATIVE NEGATIVE mg/dL   Hgb urine dipstick NEGATIVE NEGATIVE   Bilirubin Urine NEGATIVE NEGATIVE   Ketones, ur 5 (A) NEGATIVE mg/dL   Protein, ur NEGATIVE NEGATIVE mg/dL   Nitrite NEGATIVE NEGATIVE    Leukocytes, UA NEGATIVE NEGATIVE  Troponin I  Result Value Ref Range   Troponin I <0.03 <0.03 ng/mL  Magnesium  Result Value Ref Range   Magnesium 1.9 1.7 - 2.4 mg/dL   Laboratory interpretation all normal except except hypokalemia, stable anemia, anemia    EKG  EKG Interpretation  Date/Time:  Monday November 13 2016 01:07:50 EDT Ventricular Rate:  122 PR Interval:    QRS Duration: 86 QT Interval:  365 QTC Calculation: 520 R Axis:   59 Text Interpretation:  Sinus tachycardia Repol abnrm suggests ischemia, anterolateral Prolonged QT interval No old tracing to compare Confirmed by Matheson Vandehei  MD-I, Daron Stutz ([id number]) on 11/13/2016 1:55:27 AM      #2 EKG at 04:02 AM ED ECG REPORT   Date: 11/13/2016  Rate: 106  Rhythm: sinus tachycardia  QRS Axis: normal  Intervals: QT prolonged  ST/T Wave abnormalities: nonspecific ST changes  Conduction Disutrbances:none  Narrative Interpretation:   Old EKG Reviewed: unchanged except HR is slightly slower than earlier the same day  I have personally reviewed the EKG tracing and agree with the computerized printout as noted.   Radiology No results found.  Procedures Procedures (including critical care time)  Medications Ordered in ED Medications  potassium chloride 10 mEq in 100 mL IVPB (0 mEq Intravenous Stopped 11/13/16 0619)  vitamin C (ASCORBIC ACID) tablet 500 mg (not administered)  sodium chloride 0.9 % bolus 1,000 mL (1,000 mLs Intravenous Transfusing/Transfer 11/13/16 0619)  sodium chloride 0.9 % bolus 500 mL (0 mLs Intravenous Stopped 11/13/16 0406)     Initial Impression / Assessment and Plan / ED Course  I have reviewed the triage vital signs and the  nursing notes.  Pertinent labs & imaging results that were available during my care of the patient were reviewed by me and considered in my medical decision making (see chart for details).  COORDINATION OF CARE: 12:53 AM Laboratory testing was done.   02:35 AM pt has a very low  potassium. I did not feel she would be able to swallow a potassium pill so she was started on IV potassium.  05:50 AM Dr Marin Comment, hospitalist will admit.    Final Clinical Impressions(s) / ED Diagnoses   Final diagnoses:  Generalized weakness  Hypokalemia    Plan admission  Rolland Porter, MD, FACEP  I personally performed the services described in this documentation, which was scribed in my presence. The recorded information has been reviewed and considered.  Rolland Porter, MD, Barbette Or, MD 11/13/16 [phone]

## 2016-11-13 NOTE — Progress Notes (Addendum)
Patient ID: Tiffany Stewart, female   DOB: 03-Jul-1971, 46 y.o.   MRN: [id number] CALLED BY HOSPITALIST. PT WITH CONTAINED PERFORATION OF GASTRIC ULCER. RECOMMENDED SURGERY CONSULT. IV ABX. NPO AND BID PPI. CONSIDER ZOFRAN ATC FOR NAUSEA/VOMITING. D/C PO IRON WHICH CAUSES GI UPSET. REVIEWED CT SCAN. WILL NEED EGD IN 4-6 WEEKS TO ASSESS ESOPHAGUS AND GASTRIC WALL.

## 2016-11-13 NOTE — ED Notes (Signed)
Pt c/o chest pain- ECG performed and given to Dr Tomi Bamberger- no orders received at this time.

## 2016-11-13 NOTE — Progress Notes (Signed)
Pharmacy Antibiotic Note  [name] is a 46 y.o. female admitted on 11/12/2016 with intra abdominal infection.  Pharmacy has been consulted for Zosyn dosing.  Plan: Zosyn 3.375 GM IV every 8 hours Labs per protocol  Height: 5\' 5"  (165.1 cm) Weight: 130 lb (59 kg) IBW/kg (Calculated) : 57  Temp (24hrs), Avg:98.5 F (36.9 C), Min:98 F (36.7 C), Max:98.9 F (37.2 C)   Recent Labs Lab 11/13/16 0142 11/13/16 0348 11/13/16 1535  WBC 13.9*  --   --   CREATININE 0.44  --  0.46  LATICACIDVEN 0.9 0.9  --     Estimated Creatinine Clearance: 81.6 mL/min (by C-G formula based on SCr of 0.46 mg/dL).    Allergies  Allergen Reactions  . Hctz [Hydrochlorothiazide] Diarrhea  . Morphine Other (See Comments)    Flushing, turns skin red   . Zoloft [Sertraline Hcl] Other (See Comments)    Spaced out    Antimicrobials this admission: Zosyn  4/2 >>    Dose adjustments this admission:   Thank you for allowing pharmacy to be a part of this patient's care.  Chriss Czar 11/13/2016 9:00 PM

## 2016-11-13 NOTE — Progress Notes (Signed)
1015 Contrast media given to patient to begin drinking for CT ABD/PELVIS w/contrast.

## 2016-11-13 NOTE — Progress Notes (Signed)
Patient encouraged to continue to drink contrast for ordered CT ABD/PELVIS but has only consumed a small amount d/t c/o nausea and vomiting.

## 2016-11-13 NOTE — ED Notes (Signed)
CRITICAL VALUE ALERT  Critical value received:  K+- 2.5  Date of notification:  11/13/16  Time of notification:  0223  Critical value read back:Yes.    Nurse who received alert:  Rosalie Gums, RN  MD notified (1st page):  Dr Tomi Bamberger  Time of first page:  0223  MD notified (2nd page):  Time of second page:  Responding MD:  Dr Tomi Bamberger  Time MD responded:  [phone]

## 2016-11-13 NOTE — H&P (Signed)
History and Physical    Tiffany Stewart HER:[id number] DOB: [birth year] DOA: 11/12/2016  PCP: Chevis Pretty, FNP  Patient coming from: Home.    Chief Complaint:  Weakness and diffuse muscle aches.   HPI: Tiffany Stewart is an 46 y.o. female with hx of anxiety, chronic pain, hx of hereditary spastic paralysis, GERD, protein malnutrition, iron deficiency anemia, presented to the ER with diffuse muscle aches and weakness.  She has mild HA, but no fever, chills, SOB or CP.  Evaluation in the ER included Hb of 8.3 g per dL, WBC of 14K, and normal renal Fx tests.  Her iron studies were studied about 2 weeks ago, and she was found to have indices for iron deficiency anemia.  She was started on low dose iron.  Her K was very low at 2.5 mE/L in the ER, with EKG showing QTc of 520 ms.  She denied having diarrhea, being on diuretics, drinking beers, having neb Tx or started any new meds.  She does have poor appetite, and she had some nausea and vomiting.  She was given IV K runs, and felt better.  Hospitalist was asked to admit her for further Tx.   ED Course:  See above.  Rewiew of Systems:  Constitutional: Negative for malaise, fever and chills. No significant weight loss or weight gain Eyes: Negative for eye pain, redness and discharge, diplopia, visual changes, or flashes of light. ENMT: Negative for ear pain, hoarseness, nasal congestion, sinus pressure and sore throat. No headaches; tinnitus, drooling, or problem swallowing. Cardiovascular: Negative for chest pain, palpitations, diaphoresis, dyspnea and peripheral edema. ; No orthopnea, PND Respiratory: Negative for cough, hemoptysis, wheezing and stridor. No pleuritic chestpain. Gastrointestinal: Negative for diarrhea, constipation,  melena, blood in stool, hematemesis, jaundice and rectal bleeding.    Genitourinary: Negative for frequency, dysuria, incontinence,flank pain and hematuria; Musculoskeletal: Negative for back pain and neck pain.  Negative for swelling and trauma.;  Skin: . Negative for pruritus, rash, abrasions, bruising and skin lesion.; ulcerations Neuro: Negative for headache, lightheadedness and neck stiffness. Negative for weakness, altered level of consciousness , altered mental status,  burning feet, involuntary movement, seizure and syncope.  Psych: negative for anxiety, depression, insomnia, tearfulness, panic attacks, hallucinations, paranoia, suicidal or homicidal ideation   Past Medical History:  Diagnosis Date  . Anxiety   . Chronic pain   . Depression   . GERD (gastroesophageal reflux disease)   . HSP (hereditary spastic paraplegia) ([place])   . Migraines     Past Surgical History:  Procedure Laterality Date  . CESAREAN SECTION    . CHOLECYSTECTOMY       reports that she has been smoking Cigarettes.  She has a 27.00 pack-year smoking history. She has never used smokeless tobacco. She reports that she does not drink alcohol or use drugs.  Allergies  Allergen Reactions  . Hctz [Hydrochlorothiazide] Diarrhea  . Morphine Other (See Comments)    Flushing, turns skin red   . Zoloft [Sertraline Hcl] Other (See Comments)    Spaced out    Family History  Problem Relation Age of Onset  . Diabetes Father   . Cancer Father   . Heart disease Father   . Heart disease Maternal Grandmother   . Stroke Maternal Grandfather   . Diabetes Paternal Grandmother      Prior to Admission medications   Medication Sig Start Date End Date Taking? Authorizing Provider  ALPRAZolam Duanne Moron) 0.5 MG tablet Take 1 tablet (0.5 mg total) by  mouth 2 (two) times daily as needed for anxiety. 10/11/16   Mary-Margaret Hassell Done, FNP  Aspirin-Acetaminophen-Caffeine (GOODY HEADACHE PO) Take 2 packets by mouth every 4 (four) hours as needed (Pain).    Historical Provider, MD  baclofen (LIORESAL) 10 MG tablet TAKE 1 TABLET BY MOUTH TWICE DAILY 03/07/16   Mary-Margaret Hassell Done, FNP  benzonatate (TESSALON) 200 MG capsule Take 1 capsule  (200 mg total) by mouth 2 (two) times daily as needed for cough. 09/25/16   Terald Sleeper, PA-C  dicyclomine (BENTYL) 20 MG tablet Take 1 tablet (20 mg total) by mouth every 6 (six) hours. 03/07/16   Mary-Margaret Hassell Done, FNP  escitalopram (LEXAPRO) 20 MG tablet Take 1 tablet (20 mg total) by mouth daily. 06/29/16   Mary-Margaret Hassell Done, FNP  esomeprazole (NEXIUM) 20 MG capsule TAKE (1) TABLET BY MOUTH ONCE DAILY AT NOON. 07/31/16   Mary-Margaret Hassell Done, FNP  ferrous sulfate 325 (65 FE) MG tablet Take 1 tablet (325 mg total) by mouth daily. 10/30/16   Evalee Jefferson, PA-C  lisinopril (PRINIVIL,ZESTRIL) 10 MG tablet Take 1 tablet (10 mg total) by mouth daily. 03/07/16   Mary-Margaret Hassell Done, FNP  tiZANidine (ZANAFLEX) 4 MG tablet Take 1 tablet (4 mg total) by mouth 3 (three) times daily. 11/01/16   Mary-Margaret Hassell Done, FNP    Physical Exam: Vitals:   11/13/16 0245 11/13/16 0330 11/13/16 0430 11/13/16 0502  BP: (!) 160/94 140/90 132/87 (!) 155/95  Pulse: 92 75 85 83  Resp: 18 15 16 20   Temp:      TempSrc:      SpO2: 97% 97% 97% 97%  Weight:      Height:          Constitutional: NAD, calm, comfortable Vitals:   11/13/16 0245 11/13/16 0330 11/13/16 0430 11/13/16 0502  BP: (!) 160/94 140/90 132/87 (!) 155/95  Pulse: 92 75 85 83  Resp: 18 15 16 20   Temp:      TempSrc:      SpO2: 97% 97% 97% 97%  Weight:      Height:       Eyes: PERRL, lids and conjunctivae normal ENMT: Mucous membranes are moist. Posterior pharynx clear of any exudate or lesions.Normal dentition.  Neck: normal, supple, no masses, no thyromegaly Respiratory: clear to auscultation bilaterally, no wheezing, no crackles. Normal respiratory effort. No accessory muscle use.  Cardiovascular: Regular rate and rhythm, no murmurs / rubs / gallops. No extremity edema. 2+ pedal pulses. No carotid bruits.  Abdomen: no tenderness, no masses palpated. No hepatosplenomegaly. Bowel sounds positive.  Musculoskeletal: no clubbing /  cyanosis. No joint deformity upper and lower extremities. Good ROM, no contractures. Normal muscle tone.  Skin: no rashes, lesions, ulcers. No induration Neurologic: CN 2-12 grossly intact. Sensation intact, DTR normal. Strength 5/5 in all 4.  Psychiatric: Normal judgment and insight. Alert and oriented x 3. Normal mood.     Labs on Admission: I have personally reviewed following labs and imaging studies  CBC:  Recent Labs Lab 11/13/16 0142  WBC 13.9*  NEUTROABS 12.5*  HGB 8.3*  HCT 29.0*  MCV 77.3*  PLT 283*   Basic Metabolic Panel:  Recent Labs Lab 11/13/16 0142  NA 141  K 2.5*  CL 99*  CO2 29  GLUCOSE 125*  BUN 15  CREATININE 0.44  CALCIUM 9.3   GFR: Estimated Creatinine Clearance: 81.6 mL/min (by C-G formula based on SCr of 0.44 mg/dL). Liver Function Tests:  Recent Labs Lab 11/13/16 0142  AST 17  ALT  8*  ALKPHOS 81  BILITOT 0.3  PROT 7.8  ALBUMIN 3.7   Cardiac Enzymes:  Recent Labs Lab 11/13/16 0142  CKTOTAL 176  TROPONINI <0.03   Urine analysis:    Component Value Date/Time   COLORURINE YELLOW 11/13/2016 0120   APPEARANCEUR CLEAR 11/13/2016 0120   APPEARANCEUR Clear 03/07/2016 1046   LABSPEC 1.017 11/13/2016 0120   PHURINE 5.0 11/13/2016 0120   GLUCOSEU NEGATIVE 11/13/2016 0120   HGBUR NEGATIVE 11/13/2016 0120   BILIRUBINUR NEGATIVE 11/13/2016 0120   BILIRUBINUR Negative 03/07/2016 1046   KETONESUR 5 (A) 11/13/2016 0120   PROTEINUR NEGATIVE 11/13/2016 0120   UROBILINOGEN 0.2 03/12/2010 2120   NITRITE NEGATIVE 11/13/2016 0120   LEUKOCYTESUR NEGATIVE 11/13/2016 0120   LEUKOCYTESUR Negative 03/07/2016 1046   EKG: Independently reviewed.   Assessment/Plan Principal Problem:   Hypokalemia Active Problems:   Anxiety state   GERD   HSP (hereditary spastic paraplegia) ([facility])   Iron deficiency anemia    PLAN:   Hypokalemia:  I suspect she has poor dietary intake, coupled with nausea and vomting, causing her hypokalemia.  Will check  Mag level also.  Her muscle aches is likely from hypokalemia, but fortunately, she had no evidence of rhabdo with her hypokalemia, as her CPK is not elevated.  She could have K wasting syndrome nephropathy.  Will replete her K under telemetry monitoring.   Suspect she will feel significantly better.  Iron deficiency anemia:  She will need outpatient GI work up.  Will continue with PPI.  Give oral supplement with VIt C.  Anxiety: Continue with Benzo.  HSP:  Noted. She is non ambulatory, but can stand and pivot.  She has a chair lift at home, and lives with her mother   DVT prophylaxis: Lovenox.   Code Status: FULL CODE.  Family Communication: None.  Disposition Plan: home./  Consults called: None.  Admission status: OBS.    Olene Godfrey MD FACP. [facility]  If 7PM-7AM, please contact night-coverage [email or web address] Password [facility]  11/13/2016, 5:38 AM

## 2016-11-13 NOTE — Progress Notes (Signed)
MD gave verbal order and agreed to allow patient to have CT scan done without oral contrast.

## 2016-11-13 NOTE — Progress Notes (Signed)
PROGRESS NOTE  Tiffany Stewart NFA:[id number] DOB: [birth year] DOA: 11/12/2016 PCP: Chevis Pretty, FNP  Brief History:  46 year old female with a history of hereditary spastic paralysis, anxiety/depression, GERD, iron deficiency anemia presents with one-week history of generalized weakness and diffuse myalgias. At baseline, the patient is wheelchair bound and requires assistance from her knees for her activities of daily living. In addition, the patient has developed worsening diffuse abdominal pain in the past 24 hours. She had 2 episodes of nausea and vomiting prior to admission. Since admission, she has felt nauseous without any emesis. She denies any fevers, chills,  diarrhea, dysuria, hematuria, hematochezia, melena. Last bowel movement was on 11/12/2016. She denies any recent falls or trauma. She has also been complaining of bilateral chest pain for the past 1 week that is worse when she sits up. She denies any worsening shortness of breath, hemoptysis. She does have a cough with white sputum. At the time of presentation, she was noted to have a potassium 2.5 and WBC 13.9.  Otherwise BMP, LFTs were unremarkable. Platelets were 729,000. CPK 176. Lactic acid was 0.9. Urinalysis was negative for any pyuria. Chest x-ray showed left basilar atelectasis.  Assessment/Plan: Abdominal pain with nausea and vomiting -check lipase -CT abd/pelvis -continue IVF -clear liquids for now -UA--negative for pyuria  Diffuse myalgias/generalized weakness -CK--176 -may be related to her hypokalemia resulting in worsening of her spastic paralysis -B12--255--> as this is in the low range normal, plan to replete -TSH is 0.49, free T4 1 0.39  Hypokalemia -likely related GI loss and poor po intake -lower suspicion of Bartter syndrome or Gitelman syndrome as this has not been a recurrent problem  Failure to Thrive -nutrition consult -liberalized diet once able to tolerate  po  Thrombocytosis -Resulting from iron deficiency anemia and acute phase reaction -A.m. CBC -Treat iron deficiency  Atypical chest pain -cycle troponins -personally reviewed EKG--sinus rhythm, nonspecific T-wave changes -Chest x-ray negative for infiltrates or edema  Hypertension -continue lisinopril  Iron Deficiency Anemia -10/29/16 Fe Sat--2%, ferritin 5 -transfuse FeraHeme x 1 -continue ferrous sulfate  Hereditary Spastic Paraplegia -continue tinazidine -PT eval  Anxiety/Depression -cotninue lexapro and alprazolam   Disposition Plan:   Home in 1-2 days  Family Communication:   No Family at bedside--Total time spent 35 minutes.  Greater than 50% spent face to face counseling and coordinating care.  0905 to [place]:  none  Code Status:  FULL  DVT Prophylaxis:  [place] Lovenoxf   Procedures: As Listed in Progress Note Above  Antibiotics: None    Subjective: Patient complains of diffuse abdominal pain in the past 24 hours. She denies any nausea, vomiting, diarrhea. She denies any dysuria. There are no fevers or chills. She had nausea and vomiting in the last 24 hours which has improved since admission. She still feels nauseous. She complains of some chest pain without any shortness of breath or dizziness.  Objective: Vitals:   11/13/16 0430 11/13/16 0502 11/13/16 0530 11/13/16 0620  BP: 132/87 (!) 155/95 (!) 154/95 (!) 153/92  Pulse: 85 83 95 84  Resp: 16 20 (!) 35 19  Temp:      TempSrc:      SpO2: 97% 97% 98% 98%  Weight:      Height:       No intake or output data in the 24 hours ending 11/13/16 0920 Weight change:  Exam:   General:  Pt is alert, follows commands  appropriately, not in acute distress  HEENT: No icterus, No thrush, No neck mass, [place]/AT  Cardiovascular: RRR, S1/S2, no rubs, no gallops  Respiratory: CTA bilaterally, no wheezing, no crackles, no rhonchi  Abdomen: Soft/+BS, non tender, non distended, no  guarding  Extremities: No edema, No lymphangitis, No petechiae, No rashes, no synovitis   Data Reviewed: I have personally reviewed following labs and imaging studies Basic Metabolic Panel:  Recent Labs Lab 11/13/16 0142  NA 141  K 2.5*  CL 99*  CO2 29  GLUCOSE 125*  BUN 15  CREATININE 0.44  CALCIUM 9.3  MG 1.9   Liver Function Tests:  Recent Labs Lab 11/13/16 0142  AST 17  ALT 8*  ALKPHOS 81  BILITOT 0.3  PROT 7.8  ALBUMIN 3.7   No results for input(s): LIPASE, AMYLASE in the last 168 hours. No results for input(s): AMMONIA in the last 168 hours. Coagulation Profile: No results for input(s): INR, PROTIME in the last 168 hours. CBC:  Recent Labs Lab 11/13/16 0142  WBC 13.9*  NEUTROABS 12.5*  HGB 8.3*  HCT 29.0*  MCV 77.3*  PLT 729*   Cardiac Enzymes:  Recent Labs Lab 11/13/16 0142  CKTOTAL 176  TROPONINI <0.03   BNP: Invalid input(s): POCBNP CBG: No results for input(s): GLUCAP in the last 168 hours. HbA1C: No results for input(s): HGBA1C in the last 72 hours. Urine analysis:    Component Value Date/Time   COLORURINE YELLOW 11/13/2016 0120   APPEARANCEUR CLEAR 11/13/2016 0120   APPEARANCEUR Clear 03/07/2016 1046   LABSPEC 1.017 11/13/2016 0120   PHURINE 5.0 11/13/2016 0120   GLUCOSEU NEGATIVE 11/13/2016 0120   HGBUR NEGATIVE 11/13/2016 0120   BILIRUBINUR NEGATIVE 11/13/2016 0120   BILIRUBINUR Negative 03/07/2016 1046   KETONESUR 5 (A) 11/13/2016 0120   PROTEINUR NEGATIVE 11/13/2016 0120   UROBILINOGEN 0.2 03/12/2010 2120   NITRITE NEGATIVE 11/13/2016 0120   LEUKOCYTESUR NEGATIVE 11/13/2016 0120   LEUKOCYTESUR Negative 03/07/2016 1046   Sepsis Labs: @LABRCNTIP (procalcitonin:4,lacticidven:4) )No results found for this or any previous visit (from the past 240 hour(s)).   Scheduled Meds: . dicyclomine  20 mg Oral Q6H  . enoxaparin (LOVENOX) injection  40 mg Subcutaneous Q24H  . escitalopram  20 mg Oral Daily  . ferrous sulfate   325 mg Oral TID WC  . ferumoxytol  510 mg Intravenous Once  . lisinopril  10 mg Oral Daily  . pantoprazole  40 mg Oral Daily  . potassium chloride  40 mEq Oral Daily  . vitamin C  500 mg Oral TID   Continuous Infusions: . dextrose 5 % and 0.9 % NaCl with KCl 40 mEq/L 75 mL/hr at 11/13/16 1610    Procedures/Studies: Dg Chest 2 View  Result Date: 10/30/2016 CLINICAL DATA:  46 year old female with cough and weakness. EXAM: CHEST  2 VIEW COMPARISON:  Chest radiograph dated 10/30/2016 FINDINGS: Minimal probable atelectasis of the left lung base. Developing infiltrate is less likely but not excluded. Clinical correlation is recommended. There is no pleural effusion or pneumothorax. The cardiac silhouette is within normal limits. No acute osseous pathology. IMPRESSION: Minimal left lung base atelectasis versus less likely developing infiltrate. Clinical correlation is recommended. Electronically Signed   By: Anner Crete M.D.   On: 10/30/2016 01:25   Dg Chest Port 1 View  Result Date: 10/30/2016 CLINICAL DATA:  Weakness EXAM: PORTABLE CHEST 1 VIEW COMPARISON:  Chest radiograph 12/09/2004 FINDINGS: There are bibasilar linear opacities, likely atelectasis. Cardiomediastinal contours are normal. No pneumothorax or pleural  effusion. IMPRESSION: Suspected bibasilar atelectasis. Developing consolidation may have the same appearance. If the patient is clinically able, upright PA and lateral radiographs may be helpful for differentiation. Electronically Signed   By: Ulyses Jarred M.D.   On: 10/30/2016 00:34    Tiffany Waltermire, DO  [facility] Pager [phone]  If 7PM-7AM, please contact night-coverage [email or web address] Password [email or web address] 11/13/2016, 9:20 AM   LOS: 0 days

## 2016-11-14 ENCOUNTER — Encounter: Payer: Self-pay | Admitting: Gastroenterology

## 2016-11-14 ENCOUNTER — Telehealth: Payer: Self-pay | Admitting: Gastroenterology

## 2016-11-14 DIAGNOSIS — K257 Chronic gastric ulcer without hemorrhage or perforation: Secondary | ICD-10-CM

## 2016-11-14 DIAGNOSIS — D509 Iron deficiency anemia, unspecified: Secondary | ICD-10-CM

## 2016-11-14 DIAGNOSIS — K251 Acute gastric ulcer with perforation: Secondary | ICD-10-CM

## 2016-11-14 LAB — BASIC METABOLIC PANEL
Anion gap: 7 (ref 5–15)
CHLORIDE: 107 mmol/L (ref 101–111)
CO2: 21 mmol/L — ABNORMAL LOW (ref 22–32)
Calcium: 8.7 mg/dL — ABNORMAL LOW (ref 8.9–10.3)
Creatinine, Ser: 0.44 mg/dL (ref 0.44–1.00)
GFR calc Af Amer: >60 mL/min (ref >60)
GLUCOSE: 97 mg/dL (ref 65–99)
Potassium: 4.1 mmol/L (ref 3.5–5.1)
Sodium: 135 mmol/L (ref 135–145)

## 2016-11-14 LAB — CBC
HCT: 26.6 % — ABNORMAL LOW (ref 36.0–46.0)
Hemoglobin: 7.3 g/dL — ABNORMAL LOW (ref 12.0–15.0)
MCH: 21.7 pg — ABNORMAL LOW (ref 26.0–34.0)
MCHC: 27.4 g/dL — ABNORMAL LOW (ref 30.0–36.0)
MCV: 78.9 fL (ref 78.0–100.0)
Platelets: 623 10*3/uL — ABNORMAL HIGH (ref 150–400)
RBC: 3.37 MIL/uL — ABNORMAL LOW (ref 3.87–5.11)
RDW: 20.7 % — ABNORMAL HIGH (ref 11.5–15.5)
WBC: 13.9 10*3/uL — ABNORMAL HIGH (ref 4.0–10.5)

## 2016-11-14 LAB — MAGNESIUM: Magnesium: 2.2 mg/dL (ref 1.7–2.4)

## 2016-11-14 LAB — HIV ANTIBODY (ROUTINE TESTING W REFLEX): HIV SCREEN 4TH GENERATION: NONREACTIVE

## 2016-11-14 MED ORDER — POTASSIUM CHLORIDE 2 MEQ/ML IV SOLN: INTRAVENOUS | Status: DC

## 2016-11-14 MED ORDER — LORAZEPAM 2 MG/ML IJ SOLN: 0.5000 mg | Freq: Three times a day (TID) | INTRAMUSCULAR | Status: DC | PRN

## 2016-11-14 MED ORDER — KCL IN DEXTROSE-NACL 20-5-0.9 MEQ/L-%-% IV SOLN
INTRAVENOUS | Status: DC
  Administered 2016-11-14 – 2016-11-15 (×2): via INTRAVENOUS

## 2016-11-14 MED ORDER — HYDRALAZINE HCL 20 MG/ML IJ SOLN: 10.0000 mg | Freq: Four times a day (QID) | INTRAMUSCULAR | Status: DC | PRN

## 2016-11-14 NOTE — Progress Notes (Signed)
PROGRESS NOTE  Tiffany Stewart:[id number] DOB: 05-02-1971 DOA: 11/12/2016 PCP: Chevis Pretty, FNP  Brief History:  46 year old female with a history of hereditary spastic paralysis, anxiety/depression, GERD, iron deficiency anemia presents with one-week history of generalized weakness and diffuse myalgias. At baseline, the patient is wheelchair bound and requires assistance from her knees for her activities of daily living. In addition, the patient has developed worsening diffuse abdominal pain in the past 24 hours. She had 2 episodes of nausea and vomiting prior to admission. She denies any fevers, chills,  diarrhea, dysuria, hematuria, hematochezia, melena. Last bowel movement was on 11/12/2016.  She has also been complaining of bilateral chest pain for the past 1 week that is worse when she sits up. She denies any worsening shortness of breath, hemoptysis. She does have a cough with white sputum. At the time of presentation, she was noted to have a potassium 2.5 and WBC 13.9.  Otherwise BMP, LFTs were unremarkable. Platelets were 729,000. CPK 176. Lactic acid was 0.9. Urinalysis was negative for any pyuria. Chest x-ray showed left basilar atelectasis.   The patient also endorsed abdominal pain that has been worsening for "a few days". CT of the abdomen and pelvis was ordered and revealed ulcer in the posterior antrum with microperforation. Gastroenterology and general surgery were consulted.  Assessment/Plan: Perforated gastric ulceration -likely secondary to NSAIDS (takes [place] daily) -CT abd/pelvis--severe focal thickening of the antrum with posterior ulceration with microperforation. Extraluminal gas present without free intraperitoneal air -GI consulted--recommended surgery consult, npo, PPI and zofran ATC -general surgery consulted-- felt conservative management for now was most appropriate. He recommended IV PPI and stated they would be happy to follow along. -continue  IVF -NPO for now -protonix drip -UA--negative for pyuria  Diffuse myalgias/generalized weakness -CK--176 -may be related to her hypokalemia resulting in worsening of her spastic paralysis and acute medical illness -B12--255--> as this is in the low range normal, plan to replete -TSH is 0.49, free T4 1 0.39  Hypokalemia -likely related GI loss and poor po intake -lower suspicion of Bartter syndrome or Gitelman syndrome as this has not been a recurrent problem  Failure to Thrive -nutrition consult -liberalized diet once able to tolerate po  Thrombocytosis -Resulting from iron deficiency anemia and acute phase reaction -A.m. CBC -Treat iron deficiency  Atypical chest pain -cycle troponins--neg x 4 -personally reviewed EKG--sinus rhythm, nonspecific T-wave changes -Chest x-ray negative for infiltrates or edema  Hypertension -holding lisinopril until able to take po safely -hydralazine prn SBP > 180  Iron Deficiency Anemia -10/29/16 Fe Sat--2%, ferritin 5 -transfuse FeraHeme x 1 -holding ferrous sulfate until able to take po safely  Hereditary Spastic Paraplegia -holding tinazidine until able to take po safely -try IV robaxin -PT eval  Anxiety/Depression -holding lexapro and alprazolam until able to take po safely   Disposition Plan:   Home in 2-3 days  Family Communication:   No Family at bedside--Total time spent 35 minutes.  Greater than 50% spent face to face counseling and coordinating care.  0905 to [place]:  none  Code Status:  FULL  DVT Prophylaxis:  [place] Lovenox   Procedures: As Listed in Progress Note Above  Antibiotics: None   Subjective: Patient continues to complain of abdominal pain. Has not had any emesis since her CAT scan. Denies any chest pain, SOB, fevers, chills, dysuria, hematuria.  Objective: Vitals:   11/13/16 0620 11/13/16 1410 11/13/16 2029 11/14/16  0430  BP: (!) 153/92 (!) 137/91 130/90 (!) 141/90   Pulse: 84 90 (!) 109 82  Resp: 19 18 (!) 21 20  Temp:  98.5 F (36.9 C) 98.5 F (36.9 C) 97.9 F (36.6 C)  TempSrc:   Oral Oral  SpO2: 98% 96% 95% 97%  Weight:      Height:        Intake/Output Summary (Last 24 hours) at 11/14/16 1016 Last data filed at 11/14/16 0900  Gross per 24 hour  Intake           836.25 ml  Output              250 ml  Net           586.25 ml   Weight change:  Exam:   General:  Pt is alert, follows commands appropriately, not in acute distress  HEENT: No icterus, No thrush, No neck mass, [place]/AT  Cardiovascular: RRR, S1/S2, no rubs, no gallops  Respiratory: Diminished breath sounds. Scattered bibasilar crackles. No wheeze  Abdomen: Soft/+BS, diffuse tender, non distended, no guarding  Extremities: No edema, No lymphangitis, No petechiae, No rashes, no synovitis   Data Reviewed: I have personally reviewed following labs and imaging studies Basic Metabolic Panel:  Recent Labs Lab 11/13/16 0142 11/13/16 1535 11/14/16 0534  NA 141 138 135  K 2.5* 3.1* 4.1  CL 99* 101 107  CO2 29 27 21*  GLUCOSE 125* 115* 97  BUN 15 6 <5*  CREATININE 0.44 0.46 0.44  CALCIUM 9.3 8.5* 8.7*  MG 1.9  --  2.2   Liver Function Tests:  Recent Labs Lab 11/13/16 0142  AST 17  ALT 8*  ALKPHOS 81  BILITOT 0.3  PROT 7.8  ALBUMIN 3.7    Recent Labs Lab 11/13/16 0955  LIPASE 17   No results for input(s): AMMONIA in the last 168 hours. Coagulation Profile: No results for input(s): INR, PROTIME in the last 168 hours. CBC:  Recent Labs Lab 11/13/16 0142 11/14/16 0534  WBC 13.9* 13.9*  NEUTROABS 12.5*  --   HGB 8.3* 7.3*  HCT 29.0* 26.6*  MCV 77.3* 78.9  PLT 729* 623*   Cardiac Enzymes:  Recent Labs Lab 11/13/16 0142 11/13/16 0955 11/13/16 1535 11/13/16 2125  CKTOTAL 176  --   --   --   TROPONINI <0.03 <0.03 <0.03 <0.03   BNP: Invalid input(s): POCBNP CBG: No results for input(s): GLUCAP in the last 168 hours. HbA1C: No results  for input(s): HGBA1C in the last 72 hours. Urine analysis:    Component Value Date/Time   COLORURINE YELLOW 11/13/2016 0120   APPEARANCEUR CLEAR 11/13/2016 0120   APPEARANCEUR Clear 03/07/2016 1046   LABSPEC 1.017 11/13/2016 0120   PHURINE 5.0 11/13/2016 0120   GLUCOSEU NEGATIVE 11/13/2016 0120   HGBUR NEGATIVE 11/13/2016 0120   BILIRUBINUR NEGATIVE 11/13/2016 0120   BILIRUBINUR Negative 03/07/2016 1046   KETONESUR 5 (A) 11/13/2016 0120   PROTEINUR NEGATIVE 11/13/2016 0120   UROBILINOGEN 0.2 03/12/2010 2120   NITRITE NEGATIVE 11/13/2016 0120   LEUKOCYTESUR NEGATIVE 11/13/2016 0120   LEUKOCYTESUR Negative 03/07/2016 1046   Sepsis Labs: @LABRCNTIP (procalcitonin:4,lacticidven:4) )No results found for this or any previous visit (from the past 240 hour(s)).   Scheduled Meds: . enoxaparin (LOVENOX) injection  40 mg Subcutaneous Q24H  . [START ON 11/17/2016] pantoprazole  40 mg Intravenous Q12H  . piperacillin-tazobactam (ZOSYN)  IV  3.375 g Intravenous Q8H   Continuous Infusions: . dextrose 5 % and 0.9% NaCl  1,000 mL with potassium chloride 20 mEq infusion    . pantoprozole (PROTONIX) infusion 8 mg/hr (11/13/16 2334)    Procedures/Studies: Dg Chest 2 View  Result Date: 10/30/2016 CLINICAL DATA:  46 year old female with cough and weakness. EXAM: CHEST  2 VIEW COMPARISON:  Chest radiograph dated 10/30/2016 FINDINGS: Minimal probable atelectasis of the left lung base. Developing infiltrate is less likely but not excluded. Clinical correlation is recommended. There is no pleural effusion or pneumothorax. The cardiac silhouette is within normal limits. No acute osseous pathology. IMPRESSION: Minimal left lung base atelectasis versus less likely developing infiltrate. Clinical correlation is recommended. Electronically Signed   By: Anner Crete M.D.   On: 10/30/2016 01:25   Ct Abdomen Pelvis W Contrast  Addendum Date: 11/13/2016   ADDENDUM REPORT: 11/13/2016 19:37 ADDENDUM: The original  report was by Dr. Van Clines. The following addendum is by Dr. Van Clines: These results were called by telephone at the time of interpretation on 11/13/2016 at 7:37 pm to Dr. Chaney Malling, who verbally acknowledged these results. Electronically Signed   By: Van Clines M.D.   On: 11/13/2016 19:37   Result Date: 11/13/2016 CLINICAL DATA:  Diffuse abdominal pain with nausea and vomiting for 1 day. EXAM: CT ABDOMEN AND PELVIS WITH CONTRAST TECHNIQUE: Multidetector CT imaging of the abdomen and pelvis was performed using the standard protocol following bolus administration of intravenous contrast. CONTRAST:  150mL ISOVUE-300 IOPAMIDOL (ISOVUE-300) INJECTION 61% COMPARISON:  Multiple exams, including 10/15/2009 FINDINGS: Lower chest: Diffuse wall thickening of the distal esophagus. Suspected stranding in the periesophageal tissues suggesting local inflammation. Bandlike density in the right lower lobe terminating in a 0.7 cm pleural based nodule on image 5/5, probably some rounded atelectasis given the comet tail appearance, although not entirely technically specific. No nodule in this vicinity on 10/15/2009. There is atelectasis peripherally in the left lower lobe as on image 10/5. Hepatobiliary: Cholecystectomy.  No focal liver lesion identified. Pancreas: Unremarkable Spleen: Unremarkable Adrenals/Urinary Tract: Distended urinary bladder, volume estimated at 840 cc. Kidneys and adrenal glands normal. Stomach/Bowel: Severe focal wall thickening in the stomach antrum and adjacent stomach body with posterior ulceration, equivocally extraluminal gas locally along the ulceration, and surrounding inflammatory stranding. There could be some contained extraluminal gas at this level but I do not see truly "free" intraperitoneal air. Vascular/Lymphatic: There are reactive lymph nodes adjacent to the gastric ulcer, for example a mesenteric node measuring 0.8 cm in short axis on image 27/3. Reproductive:  Unremarkable Other: No supplemental non-categorized findings. Musculoskeletal: Mildly exaggerated lumbosacral carrying angle. IMPRESSION: 1. Large ulceration of the posterior gastric body, potentially with some contained micro perforation in this vicinity, and surrounding edema. Adjacent mild reactive nodal prominence. 2. Diffuse wall thickening of the distal esophagus with adjacent periesophageal stranding, favoring esophagitis. 3. 7 mm right lower lobe pleural-based nodule. This is probably mostly due to rounded atelectasis and inflammation given the plugging in the adjacent tracheobronchial tree and linear tail like extension towards the hilum. 4. Distended urinary bladder, volume 840 cc. Radiology assistant personnel have been notified to put me in telephone contact with the referring physician or the referring physician's clinical representative in order to discuss these findings. Once this communication is established I will issue an addendum to this report for documentation purposes. Electronically Signed: By: Van Clines M.D. On: 11/13/2016 19:21   Dg Chest Port 1 View  Result Date: 10/30/2016 CLINICAL DATA:  Weakness EXAM: PORTABLE CHEST 1 VIEW COMPARISON:  Chest radiograph 12/09/2004 FINDINGS: There are bibasilar  linear opacities, likely atelectasis. Cardiomediastinal contours are normal. No pneumothorax or pleural effusion. IMPRESSION: Suspected bibasilar atelectasis. Developing consolidation may have the same appearance. If the patient is clinically able, upright PA and lateral radiographs may be helpful for differentiation. Electronically Signed   By: Ulyses Jarred M.D.   On: 10/30/2016 00:34    Amisha Pospisil, DO  [facility] Pager [phone]  If 7PM-7AM, please contact night-coverage [email or web address] Password [email or web address] 11/14/2016, 10:16 AM   LOS: 1 day

## 2016-11-14 NOTE — Progress Notes (Signed)
Follow up:  Notified by Dr Janeece Fitting with radiology service regarding results of pt's ct abd/pelvis. Of most concern he noted large posterior gastric body ulcer w/ possible contained micro-perf in this area and surrounding edema. There is no free air but feels this is a finding that could change rapidly. Spoke with RN who reports that though pt still having some abd pain her VS are stable and there has been no acute change in level of abd pain. Discussed pt with Dr Oneida Alar w/ [facility] service who requested surgery be consulted. Discussed pt with Dr Rosana Hoes w/ [facility] surgery service who indicated he felt conservative management for now was most appropriate. He recommended IV PPI and stated they would be happy to follow along. Reviewed pt w/ my colleague Dr Hal Hope who also recommended adding IV Zosyn. RN aware of changes and to notify for any acute changes in pt's status. Will continue to monitor closely on telemetry.  Jeryl Columbia, NP-C [facility] Pager [phone]

## 2016-11-14 NOTE — Care Management Note (Signed)
Case Management Note  Patient Details  Name: Tiffany Stewart MRN: [id number] Date of Birth: 1971-05-01  Subjective/Objective:                  Pt admitted for abd pain/hypokalemia. She is from home and lives with her mother. Pt is WC-bound. Her mother is also and pt's niece cares for both patient and her mother. Pt states she has 24/7 supervision. She has no HH services and does not feel she needs them. She need hospital bed as she has had to forfeit hers in the past due to living arrangements. She would like to use [facility].   Action/Plan: Pt plans to return home with resumption of previous caregiver arrangement. Pt info and DME order faxed to [facility], per rep they will need to confirm whether or not pt is eligible for another hosp bed and they will notify CM. CM will cont to follow.   Expected Discharge Date:     11/17/2016             Expected Discharge Plan:  Home/Self Care  In-House Referral:  NA  Discharge planning Services  CM Consult  Post Acute Care Choice:  Durable Medical Equipment Choice offered to:  Patient  DME Arranged:  Hospital bed DME Agency:  [facility]  Status of Service:  In process, will continue to follow  Sherald Barge, RN 11/14/2016, 2:36 PM

## 2016-11-14 NOTE — Telephone Encounter (Signed)
V IN 4 WEEKS, DX: PERFORATED GASTRIC ULCER. NEEDS EGD.

## 2016-11-14 NOTE — Telephone Encounter (Signed)
APPOINTMENT MADE °

## 2016-11-14 NOTE — Progress Notes (Addendum)
Initial Nutrition Assessment  DOCUMENTATION CODES:  Underweight, Severe malnutrition in context of chronic illness (Question some adjunct social/environmental impact/context?)  INTERVENTION:  Monitor for diet advancement and add supplements as warranted.   Recommend IV MVI  Unclear what patient's true nutritional baseline is or how much she has been eating. She reports poor PO intake over months.  She may have aspect of malnutrition in context of a poor social situation.   NUTRITION DIAGNOSIS:  Malnutrition related to chronic illness, nausea, poor appetite, vomiting as evidenced by severe muscle/fat depletion  GOAL:  Patient will meet greater than or equal to 90% of their needs  MONITOR:  Diet advancement, Labs, Weight trends  REASON FOR ASSESSMENT:  Malnutrition Screening Tool    ASSESSMENT:  46 y/o female, PMHx heritary spastic paralysis (wheelchair bound), anxiety/depression, GERD, Iron deficiency, anemia. Presented with 1 week hx weakness + myalgias and more acutely AMS, abdominal pain and urinary and fecal incontinence. Worked up for contained perforation of gastric ulcer.   Pt is poor historian. Very vague. She had presented with AMS. Unsure if this has fully resolved yet.    She reports having severe nausea for only week. She also reported to have a ongoing poor appetite, but couldnt specify time period. She said 'yes' when RD asked if it had been months. She says she did not take any type of supplement at home.   Per chart review, pt had presented to the ED on 3/18 with similar symptoms. At that point she also had reported wt loss.   She gives UBW as 130 lbs. Today, the bed weight was notably lower, 96 lbs. Limited other wt history due to her inability to stand for weight. Unclear over what time frame she has lost ~35 lbs.   Pt is currently NPO. Will add supplements once diet advanced.   Physical Exam: Lower body not assessed as wasting most likely related to paraplegia  vs malnutrition. Upper body shows severe depletion of fat and mod-> severe depletion of muscle.   Labs: H/H:7.3/26.6, WBC:13.9 Medications: PPI infusion, Zosyn, D5 with KCL, Morphine   Recent Labs Lab 11/13/16 0142 11/13/16 1535 11/14/16 0534  NA 141 138 135  K 2.5* 3.1* 4.1  CL 99* 101 107  CO2 29 27 21*  BUN 15 6 <5*  CREATININE 0.44 0.46 0.44  CALCIUM 9.3 8.5* 8.7*  MG 1.9  --  2.2  GLUCOSE 125* 115* 97   Diet Order:  Diet NPO time specified  Skin:  Reviewed, no issues  Last BM:  4/1  Height:  Ht Readings from Last 1 Encounters:  11/12/16 5\' 5"  (1.651 m)   Weight:  Wt Readings from Last 1 Encounters:  11/14/16 96 lb (43.5 kg)   Wt Readings from Last 10 Encounters:  11/14/16 96 lb (43.5 kg)  10/29/16 130 lb (59 kg)  02/07/15 130 lb (59 kg)  05/10/12 135 lb (61.2 kg)  11/11/09 136 lb (61.7 kg)  03/10/08 141 lb (64 kg)   Ideal Body Weight:  51.1 kg (Adjusted for paraplegia)  BMI:  Body mass index is 15.98 kg/m.  Estimated Nutritional Needs:  Kcal:  1550-1750 kcals (35-40 kcal/kg bw) Protein:  52-61 g Pro (1.2-1.4 g/kg bw) Fluid:  >1.5 L fluid (35 ml/kg)  EDUCATION NEEDS:  No education needs identified at this time  Burtis Junes RD, LDN, [place] [facility] Pager: [phone] 11/14/2016 2:52 PM

## 2016-11-14 NOTE — Consult Note (Signed)
SURGICAL CONSULTATION NOTE (initial) - cpt: 99254  HISTORY OF PRESENT ILLNESS (HPI):  46 y.o. wheelchair-bound female with hereditary spastic paralysis presented to [facility] yesterday with altered mental status, overall weakness, and both fecal and urinary incontinence x 24 hours, along with abdominal vs chest pain x 1 week without SOB. Patient was found at presentation to be hypokalemic (k 2.5) attributed to emesis and anemic (Hb 7.3). Patient also reports that she's been taking packets of Goody powders daily x several years for chronic migraine headaches. She reports +flatus and non-bloody or black BM yesterday. Otherwise, patient denies CP, SOB.  Surgery is consulted by medical NP Chaney Malling in this context for evaluation and management of peptic ulcer disease.  PAST MEDICAL HISTORY (PMH):  Past Medical History:  Diagnosis Date  . Anxiety   . Chronic pain   . Depression   . GERD (gastroesophageal reflux disease)   . HSP (hereditary spastic paraplegia) ([place])   . Migraines      PAST SURGICAL HISTORY ([place]):  Past Surgical History:  Procedure Laterality Date  . CESAREAN SECTION    . CHOLECYSTECTOMY       MEDICATIONS:  Prior to Admission medications   Medication Sig Start Date End Date Taking? Authorizing Provider  ALPRAZolam Duanne Moron) 0.5 MG tablet Take 1 tablet (0.5 mg total) by mouth 2 (two) times daily as needed for anxiety. 10/11/16  Yes Mary-Margaret Hassell Done, FNP  Aspirin-Acetaminophen-Caffeine (GOODY HEADACHE PO) Take 2 packets by mouth every 4 (four) hours as needed (Pain).   Yes Historical Provider, MD  baclofen (LIORESAL) 10 MG tablet TAKE 1 TABLET BY MOUTH TWICE DAILY 03/07/16  Yes Mary-Margaret Hassell Done, FNP  dicyclomine (BENTYL) 20 MG tablet Take 1 tablet (20 mg total) by mouth every 6 (six) hours. 03/07/16  Yes Mary-Margaret Hassell Done, FNP  escitalopram (LEXAPRO) 20 MG tablet Take 1 tablet (20 mg total) by mouth daily. 06/29/16  Yes Mary-Margaret Hassell Done, FNP  esomeprazole  (NEXIUM) 20 MG capsule TAKE (1) TABLET BY MOUTH ONCE DAILY AT NOON. 07/31/16  Yes Mary-Margaret Hassell Done, FNP  ferrous sulfate 325 (65 FE) MG tablet Take 1 tablet (325 mg total) by mouth daily. 10/30/16  Yes Almyra Free Idol, PA-C  lisinopril (PRINIVIL,ZESTRIL) 10 MG tablet Take 1 tablet (10 mg total) by mouth daily. 03/07/16  Yes Mary-Margaret Hassell Done, FNP  tiZANidine (ZANAFLEX) 4 MG tablet Take 1 tablet (4 mg total) by mouth 3 (three) times daily. 11/01/16  Yes Mary-Margaret Hassell Done, FNP  benzonatate (TESSALON) 200 MG capsule Take 1 capsule (200 mg total) by mouth 2 (two) times daily as needed for cough. Patient not taking: Reported on 11/13/2016 09/25/16   Terald Sleeper, PA-C     ALLERGIES:  Allergies  Allergen Reactions  . Hctz [Hydrochlorothiazide] Diarrhea  . Morphine Other (See Comments)    Flushing, turns skin red   . Zoloft [Sertraline Hcl] Other (See Comments)    Spaced out     SOCIAL HISTORY:  Social History   Social History  . Marital status: Single    Spouse name: N/A  . Number of children: N/A  . Years of education: N/A   Occupational History  . Not on file.   Social History Main Topics  . Smoking status: Current Every Day Smoker    Packs/day: 1.00    Years: 27.00    Types: Cigarettes  . Smokeless tobacco: Never Used  . Alcohol use No  . Drug use: No  . Sexual activity: Not on file   Other Topics Concern  .  Not on file   Social History Narrative  . No narrative on file    The patient currently resides (home / rehab facility / nursing home): Home  The patient normally is (ambulatory / bedbound): Wheelchair bound   FAMILY HISTORY:  Family History  Problem Relation Age of Onset  . Diabetes Father   . Cancer Father   . Heart disease Father   . Heart disease Maternal Grandmother   . Stroke Maternal Grandfather   . Diabetes Paternal Grandmother    REVIEW OF SYSTEMS:  Constitutional: denies weight loss, fever, chills, or sweats  Eyes: denies any other vision  changes, history of eye injury  ENT: denies sore throat, hearing problems  Respiratory: denies shortness of breath, wheezing  Cardiovascular: denies chest pain, palpitations  Gastrointestinal: abdominal pain, N/V, and bowel function as per HPI Genitourinary: denies burning with urination or urinary frequency Musculoskeletal: denies any other joint pains or cramps  Skin: denies any other rashes or skin discolorations  Neurological: denies any other headache, dizziness, weakness  Psychiatric: denies any other depression, anxiety   All other review of systems were negative   VITAL SIGNS:  Temp:  [97.9 F (36.6 C)-98.5 F (36.9 C)] 97.9 F (36.6 C) (04/03 0430) Pulse Rate:  [82-109] 82 (04/03 0430) Resp:  [18-21] 20 (04/03 0430) BP: (130-141)/(90-91) 141/90 (04/03 0430) SpO2:  [95 %-97 %] 97 % (04/03 0430)     Height: 5\' 5"  (165.1 cm) Weight: 59 kg (130 lb) BMI (Calculated): 21.7   INTAKE/OUTPUT:  This shift: No intake/output data recorded.  Last 2 shifts: @IOLAST2SHIFTS @   PHYSICAL EXAM:  Constitutional:  -- Thin body habitus  -- Awake, alert, and oriented x3  Eyes:  -- Pupils equally round and reactive to light  -- No scleral icterus  Ear, nose, and throat:  -- No jugular venous distension  Pulmonary:  -- No crackles  -- Equal breath sounds bilaterally -- Breathing non-labored at rest Cardiovascular:  -- S1, S2 present  -- No pericardial rubs Gastrointestinal:  -- Abdomen soft and nondistended with mild-/moderate- diffuse abdominal pain, no guarding/rebound  -- No abdominal masses appreciated, pulsatile or otherwise  Musculoskeletal and Integumentary:  -- Wounds or skin discoloration: None appreciated -- Extremities: B/L UE and LE FROM, hands and feet warm, no edema  Neurologic:  -- Motor function: intact and symmetric -- Sensation: intact and symmetric  Labs:  CBC Latest Ref Rng & Units 11/14/2016 11/13/2016 10/29/2016  WBC 4.0 - 10.5 K/uL 13.9(H) 13.9(H) 9.9   Hemoglobin 12.0 - 15.0 g/dL 7.3(L) 8.3(L) 7.6(L)  Hematocrit 36.0 - 46.0 % 26.6(L) 29.0(L) 26.9(L)  Platelets 150 - 400 K/uL 623(H) 729(H) 617(H)    BMP:  Lab Results  Component Value Date   GLUCOSE 97 11/14/2016   CO2 21 (L) 11/14/2016   BUN <5 (L) 11/14/2016   BUN 12 05/03/2015   CREATININE 0.44 11/14/2016   CREATININE 0.51 02/17/2013   CALCIUM 8.7 (L) 11/14/2016     Imaging studies:  CT Abdomen and Pelvis with IV Contrast (11/13/2016) 1. Large ulceration of the posterior gastric body, potentially with some contained micro perforation in this vicinity, and surrounding edema. Adjacent mild reactive nodal prominence. 2. Diffuse wall thickening of the distal esophagus with adjacent periesophageal stranding, favoring esophagitis. 3. 7 mm right lower lobe pleural-based nodule. This is probably mostly due to rounded atelectasis and inflammation given the plugging in the adjacent tracheobronchial tree and linear tail like extension towards the hilum. 4. Distended urinary bladder, volume 840 cc.  Assessment/Plan: (ICD-10's: K25.7) 46 y.o. female with abdominal pain and hypokalemia associated with gastric antral peptic ulcer visualized on CT with concern for possible contained microperforation despite no evidence of overt extraluminal or free peritoneal air, complicated by pertinent comorbidities including chronic hereditary spastic paraplegia, NSAID abuse, GERD, migraine headaches, chronic pain disorder, major depression disorder, and generalized anxiety disorder.   - BID PPI vs PPI infusion +/- Carafate (as per GI)  - pain control + anti-emetics prn, abstain from NSAIDs (Goody powders, etc)  - supportive care and medical management of comorbidities as per medical team   - near-future EGD as per GI, following resolution of acute issues  - currently no indication for acute surgical intervention  - DVT prophylaxis  All of the above findings and recommendations were discussed with the  patient, and all of patient's questions were answered to her expressed satisfaction.  Thank you for the opportunity to participate in this patient's care.   -- Marilynne Drivers Rosana Hoes, MD, [place]: [facility] Office: [phone]

## 2016-11-14 NOTE — Care Management (Signed)
    Durable Medical Equipment        Start     Ordered   11/13/16 1142  For home use only DME Hospital bed  Once    Question Answer Comment  Patient has (list medical condition): hereditary spstic paraplegia   The above medical condition requires: Patient requires the ability to reposition frequently   Head must be elevated greater than: 30 degrees   Bed type Semi-electric   Trapeze Bar Yes      11/13/16 1142

## 2016-11-15 DIAGNOSIS — E43 Unspecified severe protein-calorie malnutrition: Secondary | ICD-10-CM

## 2016-11-15 DIAGNOSIS — K257 Chronic gastric ulcer without hemorrhage or perforation: Secondary | ICD-10-CM

## 2016-11-15 DIAGNOSIS — R109 Unspecified abdominal pain: Secondary | ICD-10-CM

## 2016-11-15 LAB — CBC
HEMATOCRIT: 26.2 % — AB (ref 36.0–46.0)
Hemoglobin: 7.4 g/dL — ABNORMAL LOW (ref 12.0–15.0)
MCH: 22 pg — ABNORMAL LOW (ref 26.0–34.0)
MCHC: 28.2 g/dL — ABNORMAL LOW (ref 30.0–36.0)
MCV: 78 fL (ref 78.0–100.0)
Platelets: 584 10*3/uL — ABNORMAL HIGH (ref 150–400)
RBC: 3.36 MIL/uL — ABNORMAL LOW (ref 3.87–5.11)
RDW: 20.9 % — AB (ref 11.5–15.5)
WBC: 8.6 10*3/uL (ref 4.0–10.5)

## 2016-11-15 LAB — BASIC METABOLIC PANEL
ANION GAP: 9 (ref 5–15)
CHLORIDE: 104 mmol/L (ref 101–111)
CO2: 24 mmol/L (ref 22–32)
Calcium: 8.8 mg/dL — ABNORMAL LOW (ref 8.9–10.3)
Creatinine, Ser: 0.53 mg/dL (ref 0.44–1.00)
GFR calc non Af Amer: >60 mL/min (ref >60)
Glucose, Bld: 92 mg/dL (ref 65–99)
POTASSIUM: 3.3 mmol/L — AB (ref 3.5–5.1)
SODIUM: 137 mmol/L (ref 135–145)

## 2016-11-15 MED ORDER — CYANOCOBALAMIN 1000 MCG/ML IJ SOLN
1000.0000 ug | Freq: Once | INTRAMUSCULAR | Status: AC
  Administered 2016-11-15: 1000 ug via INTRAMUSCULAR
  Filled 2016-11-15: qty 1

## 2016-11-15 MED ORDER — POTASSIUM CHLORIDE CRYS ER 20 MEQ PO TBCR
40.0000 meq | EXTENDED_RELEASE_TABLET | Freq: Once | ORAL | Status: AC
  Administered 2016-11-15: 40 meq via ORAL
  Filled 2016-11-15: qty 2

## 2016-11-15 NOTE — Progress Notes (Signed)
PROGRESS NOTE  Tiffany Stewart:[id number] DOB: 12-10-1970 DOA: 11/12/2016 PCP: Chevis Pretty, FNP  Brief History:  46 year old female with a history of hereditary spastic paralysis, anxiety/depression, GERD, iron deficiency anemia presents with one-week history of generalized weakness and diffuse myalgias. At baseline, the patient is wheelchair bound and requires assistance from her knees for her activities of daily living. In addition, the patient has developed worsening diffuse abdominal pain in the past 24 hours. She had 2 episodes of nausea and vomiting prior to admission. She denies any fevers, chills,  diarrhea, dysuria, hematuria, hematochezia, melena. Last bowel movement was on 11/12/2016.  She has also been complaining of bilateral chest pain for the past 1 week that is worse when she sits up. She denies any worsening shortness of breath, hemoptysis. She does have a cough with white sputum. At the time of presentation, she was noted to have a potassium 2.5 and WBC 13.9.  Otherwise BMP, LFTs were unremarkable. Platelets were 729,000. CPK 176. Lactic acid was 0.9. Urinalysis was negative for any pyuria. Chest x-ray showed left basilar atelectasis.   The patient also endorsed abdominal pain that has been worsening for "a few days". CT of the abdomen and pelvis was ordered and revealed ulcer in the posterior antrum with microperforation. Gastroenterology and general surgery were consulted.  Assessment/Plan: Perforated gastric ulceration -likely secondary to NSAIDS (takes Goodys daily) -CT abd/pelvis--severe focal thickening of the antrum with posterior ulceration with microperforation. Extraluminal gas present without free intraperitoneal air -GI consulted--recommended surgery consult, PPI and zofran ATC -general surgery consulted-- felt conservative management for now was most appropriate. He recommended IV PPI and stated they would be happy to follow along. -continue  IVF -She is currently npo, but since abdominal pain improving. Will try on clear liquids -protonix drip -UA--negative for pyuria  Diffuse myalgias/generalized weakness -CK--176 -may be related to her hypokalemia resulting in worsening of her spastic paralysis and acute medical illness -B12--255--> as this is in the low range normal, plan to replete -TSH is 0.49, free T4 1 0.39  Hypokalemia -likely related GI loss and poor po intake -lower suspicion of Bartter syndrome or Gitelman syndrome as this has not been a recurrent problem  Failure to Thrive -nutrition consult -liberalized diet once able to tolerate po  Thrombocytosis -Resulting from iron deficiency anemia and acute phase reaction - improving -Treat iron deficiency  Atypical chest pain -cycle troponins--neg x 4 -personally reviewed EKG--sinus rhythm, nonspecific T-wave changes -Chest x-ray negative for infiltrates or edema  Hypertension -holding lisinopril until able to take po safely -hydralazine prn SBP > 180  Iron Deficiency Anemia -10/29/16 Fe Sat--2%, ferritin 5 -transfuse FeraHeme x 1 -holding ferrous sulfate until able to take po safely  Hereditary Spastic Paraplegia -holding tinazidine until able to take po safely -try IV robaxin -PT eval  Anxiety/Depression -holding lexapro and alprazolam until able to take po safely   Disposition Plan:   Home in 2-3 days  Family Communication:   No Family at bedside--Total time spent 25 minutes.  Greater than 50% spent face to face counseling and coordinating care.     Consultants:  none  Code Status:  FULL  DVT Prophylaxis:  [place] Lovenox   Procedures: As Listed in Progress Note Above  Antibiotics: None   Subjective: Patient says abdominal pain is better today. No vomiting, wants to advance diet  Objective: Vitals:   11/14/16 1436 11/14/16 2010 11/15/16 0420 11/15/16 1300  BP: [name])  150/91 125/85 135/84 (!) 146/90  Pulse: 78 73 73 72   Resp: 16 17 18 18   Temp: 97.7 F (36.5 C) 98.1 F (36.7 C) 98 F (36.7 C) 98.6 F (37 C)  TempSrc: Oral Oral Oral Oral  SpO2: 98% 97% 98% 99%  Weight:      Height:        Intake/Output Summary (Last 24 hours) at 11/15/16 1550 Last data filed at 11/15/16 1453  Gross per 24 hour  Intake                0 ml  Output             2400 ml  Net            -2400 ml   Weight change:  Exam:   General:  Pt is alert, follows commands appropriately, not in acute distress  HEENT: No icterus, No thrush, No neck mass, [place]/AT  Cardiovascular: RRR, S1/S2, no rubs, no gallops  Respiratory: Diminished breath sounds. . No wheeze  Abdomen: Soft/+BS, mild tenderness in lower abd, non distended, no guarding  Extremities: No edema, No lymphangitis, No petechiae, No rashes, no synovitis   Data Reviewed: I have personally reviewed following labs and imaging studies Basic Metabolic Panel:  Recent Labs Lab 11/13/16 0142 11/13/16 1535 11/14/16 0534 11/15/16 0437  NA 141 138 135 137  K 2.5* 3.1* 4.1 3.3*  CL 99* 101 107 104  CO2 29 27 21* 24  GLUCOSE 125* 115* 97 92  BUN 15 6 <5* <5*  CREATININE 0.44 0.46 0.44 0.53  CALCIUM 9.3 8.5* 8.7* 8.8*  MG 1.9  --  2.2  --    Liver Function Tests:  Recent Labs Lab 11/13/16 0142  AST 17  ALT 8*  ALKPHOS 81  BILITOT 0.3  PROT 7.8  ALBUMIN 3.7    Recent Labs Lab 11/13/16 0955  LIPASE 17   No results for input(s): AMMONIA in the last 168 hours. Coagulation Profile: No results for input(s): INR, PROTIME in the last 168 hours. CBC:  Recent Labs Lab 11/13/16 0142 11/14/16 0534 11/15/16 0437  WBC 13.9* 13.9* 8.6  NEUTROABS 12.5*  --   --   HGB 8.3* 7.3* 7.4*  HCT 29.0* 26.6* 26.2*  MCV 77.3* 78.9 78.0  PLT 729* 623* 584*   Cardiac Enzymes:  Recent Labs Lab 11/13/16 0142 11/13/16 0955 11/13/16 1535 11/13/16 2125  CKTOTAL 176  --   --   --   TROPONINI <0.03 <0.03 <0.03 <0.03   BNP: Invalid input(s):  POCBNP CBG: No results for input(s): GLUCAP in the last 168 hours. HbA1C: No results for input(s): HGBA1C in the last 72 hours. Urine analysis:    Component Value Date/Time   COLORURINE YELLOW 11/13/2016 0120   APPEARANCEUR CLEAR 11/13/2016 0120   APPEARANCEUR Clear 03/07/2016 1046   LABSPEC 1.017 11/13/2016 0120   PHURINE 5.0 11/13/2016 0120   GLUCOSEU NEGATIVE 11/13/2016 0120   HGBUR NEGATIVE 11/13/2016 0120   BILIRUBINUR NEGATIVE 11/13/2016 0120   BILIRUBINUR Negative 03/07/2016 1046   KETONESUR 5 (A) 11/13/2016 0120   PROTEINUR NEGATIVE 11/13/2016 0120   UROBILINOGEN 0.2 03/12/2010 2120   NITRITE NEGATIVE 11/13/2016 0120   LEUKOCYTESUR NEGATIVE 11/13/2016 0120   LEUKOCYTESUR Negative 03/07/2016 1046   Sepsis Labs: @LABRCNTIP (procalcitonin:4,lacticidven:4) )No results found for this or any previous visit (from the past 240 hour(s)).   Scheduled Meds: . enoxaparin (LOVENOX) injection  40 mg Subcutaneous Q24H  . [START ON 11/17/2016] pantoprazole  40  mg Intravenous Q12H  . piperacillin-tazobactam (ZOSYN)  IV  3.375 g Intravenous Q8H  . potassium chloride  40 mEq Oral Once   Continuous Infusions: . dextrose 5 % and 0.9 % NaCl with KCl 20 mEq/L 100 mL/hr at 11/14/16 1030  . pantoprozole (PROTONIX) infusion 8 mg/hr (11/15/16 1236)    Procedures/Studies: Dg Chest 2 View  Result Date: 10/30/2016 CLINICAL DATA:  46 year old female with cough and weakness. EXAM: CHEST  2 VIEW COMPARISON:  Chest radiograph dated 10/30/2016 FINDINGS: Minimal probable atelectasis of the left lung base. Developing infiltrate is less likely but not excluded. Clinical correlation is recommended. There is no pleural effusion or pneumothorax. The cardiac silhouette is within normal limits. No acute osseous pathology. IMPRESSION: Minimal left lung base atelectasis versus less likely developing infiltrate. Clinical correlation is recommended. Electronically Signed   By: Anner Crete M.D.   On: 10/30/2016  01:25   Ct Abdomen Pelvis W Contrast  Addendum Date: 11/13/2016   ADDENDUM REPORT: 11/13/2016 19:37 ADDENDUM: The original report was by Dr. Van Clines. The following addendum is by Dr. Van Clines: These results were called by telephone at the time of interpretation on 11/13/2016 at 7:37 pm to Dr. Chaney Malling, who verbally acknowledged these results. Electronically Signed   By: Van Clines M.D.   On: 11/13/2016 19:37   Result Date: 11/13/2016 CLINICAL DATA:  Diffuse abdominal pain with nausea and vomiting for 1 day. EXAM: CT ABDOMEN AND PELVIS WITH CONTRAST TECHNIQUE: Multidetector CT imaging of the abdomen and pelvis was performed using the standard protocol following bolus administration of intravenous contrast. CONTRAST:  132mL ISOVUE-300 IOPAMIDOL (ISOVUE-300) INJECTION 61% COMPARISON:  Multiple exams, including 10/15/2009 FINDINGS: Lower chest: Diffuse wall thickening of the distal esophagus. Suspected stranding in the periesophageal tissues suggesting local inflammation. Bandlike density in the right lower lobe terminating in a 0.7 cm pleural based nodule on image 5/5, probably some rounded atelectasis given the comet tail appearance, although not entirely technically specific. No nodule in this vicinity on 10/15/2009. There is atelectasis peripherally in the left lower lobe as on image 10/5. Hepatobiliary: Cholecystectomy.  No focal liver lesion identified. Pancreas: Unremarkable Spleen: Unremarkable Adrenals/Urinary Tract: Distended urinary bladder, volume estimated at 840 cc. Kidneys and adrenal glands normal. Stomach/Bowel: Severe focal wall thickening in the stomach antrum and adjacent stomach body with posterior ulceration, equivocally extraluminal gas locally along the ulceration, and surrounding inflammatory stranding. There could be some contained extraluminal gas at this level but I do not see truly "free" intraperitoneal air. Vascular/Lymphatic: There are reactive lymph  nodes adjacent to the gastric ulcer, for example a mesenteric node measuring 0.8 cm in short axis on image 27/3. Reproductive: Unremarkable Other: No supplemental non-categorized findings. Musculoskeletal: Mildly exaggerated lumbosacral carrying angle. IMPRESSION: 1. Large ulceration of the posterior gastric body, potentially with some contained micro perforation in this vicinity, and surrounding edema. Adjacent mild reactive nodal prominence. 2. Diffuse wall thickening of the distal esophagus with adjacent periesophageal stranding, favoring esophagitis. 3. 7 mm right lower lobe pleural-based nodule. This is probably mostly due to rounded atelectasis and inflammation given the plugging in the adjacent tracheobronchial tree and linear tail like extension towards the hilum. 4. Distended urinary bladder, volume 840 cc. Radiology assistant personnel have been notified to put me in telephone contact with the referring physician or the referring physician's clinical representative in order to discuss these findings. Once this communication is established I will issue an addendum to this report for documentation purposes. Electronically Signed: By: Thayer Jew  Janeece Fitting M.D. On: 11/13/2016 19:21   Dg Chest Port 1 View  Result Date: 10/30/2016 CLINICAL DATA:  Weakness EXAM: PORTABLE CHEST 1 VIEW COMPARISON:  Chest radiograph 12/09/2004 FINDINGS: There are bibasilar linear opacities, likely atelectasis. Cardiomediastinal contours are normal. No pneumothorax or pleural effusion. IMPRESSION: Suspected bibasilar atelectasis. Developing consolidation may have the same appearance. If the patient is clinically able, upright PA and lateral radiographs may be helpful for differentiation. Electronically Signed   By: Ulyses Jarred M.D.   On: 10/30/2016 00:34    MEMON,JEHANZEB, MD  [facility] Pager [phone]  If 7PM-7AM, please contact night-coverage [email or web address] Password [email or web address] 11/15/2016, 3:50 PM   LOS: 2 days

## 2016-11-15 NOTE — Progress Notes (Signed)
SURGICAL PROGRESS NOTE (cpt [phone])  Hospital Day(s): 2.   Post op day(s):  [name]   Interval History: Patient seen and examined, no acute events or new complaints overnight. Patient reports her abdominal pain has continued to improve significantly and she's been tolerating clear liquids diet today, while she denies N/V, fever/chills, CP, or SOB.  Review of Systems:  Constitutional: denies fever, chills  HEENT: denies cough or congestion  Respiratory: denies any shortness of breath  Cardiovascular: denies chest pain or palpitations  Gastrointestinal: abdominal pain, N/V, and bowel function as per interval history Genitourinary: denies burning with urination or urinary frequency Musculoskeletal: denies pain, decreased motor or sensation Integumentary: denies any other rashes or skin discolorations Neurological: denies HA or vision/hearing changes   Vital signs in last 24 hours: [min-max] current  Temp:  [97.7 F (36.5 C)-98.1 F (36.7 C)] 98 F (36.7 C) (04/04 0420) Pulse Rate:  [73-78] 73 (04/04 0420) Resp:  [16-18] 18 (04/04 0420) BP: (125-150)/(84-91) 135/84 (04/04 0420) SpO2:  [97 %-98 %] 98 % (04/04 0420) Weight:  [43.5 kg (96 lb)] 43.5 kg (96 lb) (04/03 1421)     Height: 5\' 5"  (165.1 cm) Weight: 43.5 kg (96 lb) (Bed weight) BMI (Calculated): 21.7   Intake/Output this shift:  Total I/O In: -  Out: 100 [Urine:100]   Intake/Output last 2 shifts:  @IOLAST2SHIFTS @   Physical Exam:  Constitutional: alert, cooperative and no distress  HENT: normocephalic without obvious abnormality  Eyes: PERRL, EOM's grossly intact and symmetric  Neuro: CN II - XII grossly intact and symmetric without deficit  Respiratory: breathing non-labored at rest  Cardiovascular: regular rate and sinus rhythm  Gastrointestinal: soft, non-tender, and non-distended  Musculoskeletal: UE and LE FROM, no edema or wounds, motor and sensation grossly intact, NT   Labs:  CBC:  Lab Results  Component  Value Date   WBC 8.6 11/15/2016   RBC 3.36 (L) 11/15/2016   BMP:  Lab Results  Component Value Date   GLUCOSE 92 11/15/2016   CO2 24 11/15/2016   BUN <5 (L) 11/15/2016   BUN 12 05/03/2015   CREATININE 0.53 11/15/2016   CREATININE 0.51 02/17/2013   CALCIUM 8.8 (L) 11/15/2016     Imaging studies: No new pertinent imaging studies   Assessment/Plan: (ICD-10's: K25.7) 46 y.o. female with resolving abdominal pain and hypokalemia associated with gastric antral peptic ulcer visualized on CT with concern for possible contained microperforation despite no evidence of overt extraluminal or free peritoneal air, complicated by pertinent comorbidities including chronic hereditary spastic paraplegia, prolonged NSAID abuse, GERD, migraine headaches, chronic pain disorder, major depression disorder, and generalized anxiety disorder.              - BID PPI +/- Carafate (as per GI)             - abstain from NSAIDs (Goody powders, etc)             - supportive care and medical management of comorbidities as per medical team              - near-future EGD as per GI, following resolution of acute issues             - will sign off, please call if any questions             - DVT prophylaxis  All of the above findings and recommendations were discussed with the patient, patient's family, and patient's medical physician, and all of patient's and her  family's questions were answered to their expressed satisfaction.  Thank you for the opportunity to participate in this patient's care.   -- Marilynne Drivers Rosana Hoes, MD, [place]: [facility] Office: [phone]

## 2016-11-16 LAB — CBC
HCT: 26.7 % — ABNORMAL LOW (ref 36.0–46.0)
HEMOGLOBIN: 7.6 g/dL — AB (ref 12.0–15.0)
MCH: 22.3 pg — ABNORMAL LOW (ref 26.0–34.0)
MCHC: 28.5 g/dL — AB (ref 30.0–36.0)
MCV: 78.3 fL (ref 78.0–100.0)
Platelets: 555 10*3/uL — ABNORMAL HIGH (ref 150–400)
RBC: 3.41 MIL/uL — AB (ref 3.87–5.11)
RDW: 20.9 % — ABNORMAL HIGH (ref 11.5–15.5)
WBC: 5.8 10*3/uL (ref 4.0–10.5)

## 2016-11-16 LAB — BASIC METABOLIC PANEL
ANION GAP: 8 (ref 5–15)
BUN: <5 mg/dL — ABNORMAL LOW (ref 6–20)
CALCIUM: 8.7 mg/dL — AB (ref 8.9–10.3)
CO2: 25 mmol/L (ref 22–32)
Chloride: 105 mmol/L (ref 101–111)
Creatinine, Ser: 0.53 mg/dL (ref 0.44–1.00)
Glucose, Bld: 85 mg/dL (ref 65–99)
Potassium: 3.3 mmol/L — ABNORMAL LOW (ref 3.5–5.1)
Sodium: 138 mmol/L (ref 135–145)

## 2016-11-16 MED ORDER — PANTOPRAZOLE SODIUM 40 MG PO TBEC: 40.0000 mg | DELAYED_RELEASE_TABLET | Freq: Two times a day (BID) | ORAL | Status: DC

## 2016-11-16 MED ORDER — AMOXICILLIN-POT CLAVULANATE 875-125 MG PO TABS: 1.0000 | ORAL_TABLET | Freq: Two times a day (BID) | ORAL | 0 refills | Status: DC

## 2016-11-16 MED ORDER — PANTOPRAZOLE SODIUM 40 MG PO TBEC: 40.0000 mg | DELAYED_RELEASE_TABLET | Freq: Two times a day (BID) | ORAL | 1 refills | Status: DC

## 2016-11-16 MED ORDER — HYDROCODONE-ACETAMINOPHEN 5-325 MG PO TABS: 1.0000 | ORAL_TABLET | Freq: Four times a day (QID) | ORAL | 0 refills | Status: DC | PRN

## 2016-11-16 MED ORDER — POTASSIUM CHLORIDE CRYS ER 20 MEQ PO TBCR
40.0000 meq | EXTENDED_RELEASE_TABLET | Freq: Once | ORAL | Status: AC
  Administered 2016-11-16: 40 meq via ORAL
  Filled 2016-11-16: qty 2

## 2016-11-16 NOTE — Care Management Important Message (Signed)
Important Message  Patient Details  Name: Tiffany Stewart MRN: [id number] Date of Birth: 01/14/1971   Medicare Important Message Given:  Yes    Sherald Barge, RN 11/16/2016, 4:13 PM

## 2016-11-16 NOTE — Care Management Note (Signed)
Case Management Note  Patient Details  Name: Tiffany Stewart MRN: [id number] Date of Birth: November 13, 1970   Expected Discharge Date:  11/16/16               Expected Discharge Plan:  Home/Self Care  In-House Referral:  NA  Discharge planning Services  CM Consult  Post Acute Care Choice:  Durable Medical Equipment Choice offered to:  Patient  DME Arranged:  Hospital bed DME Agency:  [facility]  Status of Service:  Completed, signed off   Additional Comments: Pt discharging home today with previous caregiver arrangement. [facility] to provide hospital bed, they have been contacted and plan to deliver hospital bed today. Pt will be going to stay with niece. Pt does not feel she needs [facility] nursing. No other needs communicated. She plans for niece to transport her home.  Sherald Barge, RN 11/16/2016, 4:14 PM

## 2016-11-16 NOTE — Discharge Summary (Signed)
Physician Discharge Summary  Tiffany Stewart JWL:[id number] DOB: 1971-05-28 DOA: 11/12/2016  PCP: Chevis Pretty, FNP  Admit date: 11/12/2016 Discharge date: 11/16/2016  Admitted From:  Disposition:  home  Recommendations for Outpatient Follow-up:  1. Follow up with PCP in 1-2 weeks 2. Follow-up with GI in 4 weeks for EGD. 3. Continue PPI twice a day and avoid all NSAIDs. 4. Iron supplementation held for now since it may cause stomach upset.  Home Health: Equipment/Devices:  Discharge Condition:Stable CODE STATUS: full Diet recommendation: Heart Healthy   Brief/Interim Summary: 46 year old female with a history of hereditary spastic paralysis, anxiety/depression, GERD, iron deficiency anemia presents with one-week history of generalized weakness and diffuse myalgias. At baseline, the patient is wheelchair bound and requires assistance from her knees for her activities of daily living. In addition, the patient has developed worsening diffuse abdominal pain in the past 24 hours. She had 2 episodes of nausea and vomiting prior to admission. She denies any fevers, chills, diarrhea, dysuria, hematuria, hematochezia, melena. Last bowel movement was on 11/12/2016.  She has also been complaining of bilateral chest pain for the past 1 week that is worse when she sits up. She denies any worsening shortness of breath, hemoptysis. She does have a cough with white sputum. At the time of presentation, she was noted to have a potassium 2.5 and WBC 13.9. Otherwise BMP, LFTs were unremarkable. Platelets were 729,000. CPK 176. Lactic acid was 0.9. Urinalysis was negative for any pyuria. Chest x-ray showed left basilar atelectasis.   The patient also endorsed abdominal pain that has been worsening for "a few days". CT of the abdomen and pelvis was ordered and revealed ulcer in the posterior antrum with microperforation. Gastroenterology and general surgery were consulted.  Discharge Diagnoses:  Principal  Problem:   Hypokalemia Active Problems:   Anxiety state   GERD   HSP (hereditary spastic paraplegia) (HCC)   Iron deficiency anemia   Diffuse abdominal pain   Failure to thrive in adult   Atypical chest pain   Generalized weakness   Perforated gastric ulcer (HCC)   Protein-calorie malnutrition, severe  Perforated gastric ulceration -likely secondary to NSAIDS (takes Goodys daily) -CT abd/pelvis--severe focal thickening of the antrum with posterior ulceration with microperforation. Extraluminal gas present without free intraperitoneal air -GI consulted--recommended surgery consult, PPI and zofran ATC -general surgery consulted--felt conservative management for now was most appropriate. He recommended IV PPI  -Patient was treated with intravenous proton pump inhibitors. I will subsequently transitioned to oral PPIs. -Diet was slowly advanced from nothing by mouth, to liquid, to solid food. She appeared to tolerate this well and did not have any worsening of her pain. She's not had any vomiting. -She's been treated with a course of antibiotics and will be discharged with Augmentin. -She'll follow up with GI as an outpatient in 4 weeks to be considered for EGD.  Diffuse myalgias/generalized weakness -CK--176 -may be related to her hypokalemia resulting in worsening of her spastic paralysis and acute medical illness -B12--255-->as this is in the low range normal, plan to replete. She was given 1 dose of IM B-12 -TSH is 0.49, free T4 1 0.39  Hypokalemia -likely related GI loss and poor po intake, improved with replacement -lower suspicion of Bartter syndrome orGitelman syndrome as this has not been a recurrent problem  Failure to Thrive -nutrition consulted  Thrombocytosis -Resulting from iron deficiency anemia and acute phase reaction - improving -Treat iron deficiency  Atypical chest pain -cycle troponins--neg x 4 -personally reviewed  EKG--sinus rhythm, nonspecific  T-wave changes -Chest x-ray negative for infiltrates or edema  Hypertension -Continue lisinopril on discharge. Blood pressure remained stable  Iron Deficiency Anemia -10/29/16 Fe Sat--2%, ferritin 5 -transfuse FeraHeme x 1 -holding ferrous sulfate for now since this may cause stomach upset.  Hereditary Spastic Paraplegia Continue tizanidine and baclofen on discharge  Anxiety/Depression -Continue Lexapro and alprazolam on discharge  Discharge Instructions  Discharge Instructions    Diet - low sodium heart healthy    Complete by:  As directed    Increase activity slowly    Complete by:  As directed      Allergies as of 11/16/2016      Reactions   Hctz [hydrochlorothiazide] Diarrhea   Morphine Other (See Comments)   Flushing, turns skin red    Zoloft [sertraline Hcl] Other (See Comments)   Spaced out      Medication List    STOP taking these medications   esomeprazole 20 MG capsule Commonly known as:  NEXIUM   ferrous sulfate 325 (65 FE) MG tablet   GOODY HEADACHE PO     TAKE these medications   ALPRAZolam 0.5 MG tablet Commonly known as:  XANAX Take 1 tablet (0.5 mg total) by mouth 2 (two) times daily as needed for anxiety.   amoxicillin-clavulanate 875-125 MG tablet Commonly known as:  AUGMENTIN Take 1 tablet by mouth 2 (two) times daily.   baclofen 10 MG tablet Commonly known as:  LIORESAL TAKE 1 TABLET BY MOUTH TWICE DAILY   benzonatate 200 MG capsule Commonly known as:  TESSALON Take 1 capsule (200 mg total) by mouth 2 (two) times daily as needed for cough.   dicyclomine 20 MG tablet Commonly known as:  BENTYL Take 1 tablet (20 mg total) by mouth every 6 (six) hours.   escitalopram 20 MG tablet Commonly known as:  LEXAPRO Take 1 tablet (20 mg total) by mouth daily.   HYDROcodone-acetaminophen 5-325 MG tablet Commonly known as:  NORCO Take 1 tablet by mouth every 6 (six) hours as needed for moderate pain.   lisinopril 10 MG tablet Commonly  known as:  PRINIVIL,ZESTRIL Take 1 tablet (10 mg total) by mouth daily.   pantoprazole 40 MG tablet Commonly known as:  PROTONIX Take 1 tablet (40 mg total) by mouth 2 (two) times daily before a meal.   tiZANidine 4 MG tablet Commonly known as:  ZANAFLEX Take 1 tablet (4 mg total) by mouth 3 (three) times daily.            Durable Medical Equipment        Start     Ordered   11/13/16 1142  For home use only DME Hospital bed  Once    Question Answer Comment  Patient has (list medical condition): hereditary spstic paraplegia   The above medical condition requires: Patient requires the ability to reposition frequently   Head must be elevated greater than: 30 degrees   Bed type Semi-electric   Trapeze Bar Yes      11/13/16 1142     Follow-up Information    Mary-Margaret Hassell Done, FNP Follow up.   Specialty:  Family Medicine Why:  follow up in 2 weeks. avoid all ibuprofen/advil/aleve/motrin/goodys powder Contact information: [place] [id number] [phone]          Allergies  Allergen Reactions  . Hctz [Hydrochlorothiazide] Diarrhea  . Morphine Other (See Comments)    Flushing, turns skin red   . Zoloft [Sertraline Hcl] Other (See  Comments)    Spaced out    Consultations:  Gastroenterology  General surgery   Procedures/Studies: Dg Chest 2 View  Result Date: 10/30/2016 CLINICAL DATA:  46 year old female with cough and weakness. EXAM: CHEST  2 VIEW COMPARISON:  Chest radiograph dated 10/30/2016 FINDINGS: Minimal probable atelectasis of the left lung base. Developing infiltrate is less likely but not excluded. Clinical correlation is recommended. There is no pleural effusion or pneumothorax. The cardiac silhouette is within normal limits. No acute osseous pathology. IMPRESSION: Minimal left lung base atelectasis versus less likely developing infiltrate. Clinical correlation is recommended. Electronically Signed   By: Anner Crete M.D.    On: 10/30/2016 01:25   Ct Abdomen Pelvis W Contrast  Addendum Date: 11/13/2016   ADDENDUM REPORT: 11/13/2016 19:37 ADDENDUM: The original report was by Dr. Van Clines. The following addendum is by Dr. Van Clines: These results were called by telephone at the time of interpretation on 11/13/2016 at 7:37 pm to Dr. Chaney Malling, who verbally acknowledged these results. Electronically Signed   By: Van Clines M.D.   On: 11/13/2016 19:37   Result Date: 11/13/2016 CLINICAL DATA:  Diffuse abdominal pain with nausea and vomiting for 1 day. EXAM: CT ABDOMEN AND PELVIS WITH CONTRAST TECHNIQUE: Multidetector CT imaging of the abdomen and pelvis was performed using the standard protocol following bolus administration of intravenous contrast. CONTRAST:  165mL ISOVUE-300 IOPAMIDOL (ISOVUE-300) INJECTION 61% COMPARISON:  Multiple exams, including 10/15/2009 FINDINGS: Lower chest: Diffuse wall thickening of the distal esophagus. Suspected stranding in the periesophageal tissues suggesting local inflammation. Bandlike density in the right lower lobe terminating in a 0.7 cm pleural based nodule on image 5/5, probably some rounded atelectasis given the comet tail appearance, although not entirely technically specific. No nodule in this vicinity on 10/15/2009. There is atelectasis peripherally in the left lower lobe as on image 10/5. Hepatobiliary: Cholecystectomy.  No focal liver lesion identified. Pancreas: Unremarkable Spleen: Unremarkable Adrenals/Urinary Tract: Distended urinary bladder, volume estimated at 840 cc. Kidneys and adrenal glands normal. Stomach/Bowel: Severe focal wall thickening in the stomach antrum and adjacent stomach body with posterior ulceration, equivocally extraluminal gas locally along the ulceration, and surrounding inflammatory stranding. There could be some contained extraluminal gas at this level but I do not see truly "free" intraperitoneal air. Vascular/Lymphatic: There are  reactive lymph nodes adjacent to the gastric ulcer, for example a mesenteric node measuring 0.8 cm in short axis on image 27/3. Reproductive: Unremarkable Other: No supplemental non-categorized findings. Musculoskeletal: Mildly exaggerated lumbosacral carrying angle. IMPRESSION: 1. Large ulceration of the posterior gastric body, potentially with some contained micro perforation in this vicinity, and surrounding edema. Adjacent mild reactive nodal prominence. 2. Diffuse wall thickening of the distal esophagus with adjacent periesophageal stranding, favoring esophagitis. 3. 7 mm right lower lobe pleural-based nodule. This is probably mostly due to rounded atelectasis and inflammation given the plugging in the adjacent tracheobronchial tree and linear tail like extension towards the hilum. 4. Distended urinary bladder, volume 840 cc. Radiology assistant personnel have been notified to put me in telephone contact with the referring physician or the referring physician's clinical representative in order to discuss these findings. Once this communication is established I will issue an addendum to this report for documentation purposes. Electronically Signed: By: Van Clines M.D. On: 11/13/2016 19:21   Dg Chest Port 1 View  Result Date: 10/30/2016 CLINICAL DATA:  Weakness EXAM: PORTABLE CHEST 1 VIEW COMPARISON:  Chest radiograph 12/09/2004 FINDINGS: There are bibasilar linear opacities, likely atelectasis. Cardiomediastinal  contours are normal. No pneumothorax or pleural effusion. IMPRESSION: Suspected bibasilar atelectasis. Developing consolidation may have the same appearance. If the patient is clinically able, upright PA and lateral radiographs may be helpful for differentiation. Electronically Signed   By: Ulyses Jarred M.D.   On: 10/30/2016 00:34        Subjective: Abdominal pain is better. Tolerating solid diet.  Discharge Exam: Vitals:   11/16/16 0500 11/16/16 1406  BP: (!) 133/91 126/82   Pulse: 73 84  Resp: 18 18  Temp: 97.6 F (36.4 C) 98.4 F (36.9 C)   Vitals:   11/15/16 2100 11/16/16 0500 11/16/16 0948 11/16/16 1406  BP: 124/79 (!) 133/91  126/82  Pulse: 69 73  84  Resp: 18 18  18   Temp: 98.3 F (36.8 C) 97.6 F (36.4 C)  98.4 F (36.9 C)  TempSrc: Oral Oral  Oral  SpO2: 99% 98% 98% 100%  Weight:      Height:        General: Pt is alert, awake, not in acute distress Cardiovascular: RRR, S1/S2 +, no rubs, no gallops Respiratory: CTA bilaterally, no wheezing, no rhonchi Abdominal: Soft, NT, ND, bowel sounds + Extremities: no edema, no cyanosis    The results of significant diagnostics from this hospitalization (including imaging, microbiology, ancillary and laboratory) are listed below for reference.     Microbiology: No results found for this or any previous visit (from the past 240 hour(s)).   Labs: BNP (last 3 results) No results for input(s): BNP in the last 8760 hours. Basic Metabolic Panel:  Recent Labs Lab 11/13/16 0142 11/13/16 1535 11/14/16 0534 11/15/16 0437 11/16/16 0610  NA 141 138 135 137 138  K 2.5* 3.1* 4.1 3.3* 3.3*  CL 99* 101 107 104 105  CO2 29 27 21* 24 25  GLUCOSE 125* 115* 97 92 85  BUN 15 6 <5* <5* <5*  CREATININE 0.44 0.46 0.44 0.53 0.53  CALCIUM 9.3 8.5* 8.7* 8.8* 8.7*  MG 1.9  --  2.2  --   --    Liver Function Tests:  Recent Labs Lab 11/13/16 0142  AST 17  ALT 8*  ALKPHOS 81  BILITOT 0.3  PROT 7.8  ALBUMIN 3.7    Recent Labs Lab 11/13/16 0955  LIPASE 17   No results for input(s): AMMONIA in the last 168 hours. CBC:  Recent Labs Lab 11/13/16 0142 11/14/16 0534 11/15/16 0437 11/16/16 0610  WBC 13.9* 13.9* 8.6 5.8  NEUTROABS 12.5*  --   --   --   HGB 8.3* 7.3* 7.4* 7.6*  HCT 29.0* 26.6* 26.2* 26.7*  MCV 77.3* 78.9 78.0 78.3  PLT 729* 623* 584* 555*   Cardiac Enzymes:  Recent Labs Lab 11/13/16 0142 11/13/16 0955 11/13/16 1535 11/13/16 2125  CKTOTAL 176  --   --   --    TROPONINI <0.03 <0.03 <0.03 <0.03   BNP: Invalid input(s): POCBNP CBG: No results for input(s): GLUCAP in the last 168 hours. D-Dimer No results for input(s): DDIMER in the last 72 hours. Hgb A1c No results for input(s): HGBA1C in the last 72 hours. Lipid Profile No results for input(s): CHOL, HDL, LDLCALC, TRIG, CHOLHDL, LDLDIRECT in the last 72 hours. Thyroid function studies No results for input(s): TSH, T4TOTAL, T3FREE, THYROIDAB in the last 72 hours.  Invalid input(s): FREET3 Anemia work up No results for input(s): VITAMINB12, FOLATE, FERRITIN, TIBC, IRON, RETICCTPCT in the last 72 hours. Urinalysis    Component Value Date/Time   COLORURINE YELLOW  11/13/2016 0120   APPEARANCEUR CLEAR 11/13/2016 0120   APPEARANCEUR Clear 03/07/2016 1046   LABSPEC 1.017 11/13/2016 0120   PHURINE 5.0 11/13/2016 0120   GLUCOSEU NEGATIVE 11/13/2016 0120   HGBUR NEGATIVE 11/13/2016 0120   BILIRUBINUR NEGATIVE 11/13/2016 0120   BILIRUBINUR Negative 03/07/2016 1046   KETONESUR 5 (A) 11/13/2016 0120   PROTEINUR NEGATIVE 11/13/2016 0120   UROBILINOGEN 0.2 03/12/2010 2120   NITRITE NEGATIVE 11/13/2016 0120   LEUKOCYTESUR NEGATIVE 11/13/2016 0120   LEUKOCYTESUR Negative 03/07/2016 1046   Sepsis Labs Invalid input(s): PROCALCITONIN,  WBC,  LACTICIDVEN Microbiology No results found for this or any previous visit (from the past 240 hour(s)).   Time coordinating discharge: Over 30 minutes  SIGNED:   Kathie Dike, MD  [facility] 11/16/2016, 3:38 PM Pager   If 7PM-7AM, please contact night-coverage [email or web address] Password [email or web address]

## 2016-11-16 NOTE — Progress Notes (Signed)
Patient discharged with instructions given on medications and follow up visits, patient verbalized understanding. Prescriptions sent to  Pharmacy of choice documented on documented on AVS.  Staff accompanied patient to awaiting vehicle.

## 2016-11-29 ENCOUNTER — Telehealth: Payer: Self-pay | Admitting: Nurse Practitioner

## 2016-11-29 NOTE — Telephone Encounter (Signed)
Patient states that the tizanidine 4mg  is not working and would like to know if it can be increased. Patient wants to know if not  increased can she take it more than TID as instructed on the bottle. Patient has apt with MMM Friday. Patient aware not to take more and follow the instructions on the bottle. Patient verbalizes understanding and aware MMM will not be back in the office until tomorrow.

## 2016-11-29 NOTE — Telephone Encounter (Signed)
Will discuss at appointment on friday

## 2016-12-01 ENCOUNTER — Encounter: Payer: Self-pay | Admitting: Nurse Practitioner

## 2016-12-01 ENCOUNTER — Ambulatory Visit (INDEPENDENT_AMBULATORY_CARE_PROVIDER_SITE_OTHER): Payer: Medicare Other | Admitting: Nurse Practitioner

## 2016-12-01 ENCOUNTER — Ambulatory Visit: Payer: Medicare Other | Admitting: Nurse Practitioner

## 2016-12-01 VITALS — BP 123/93 | HR 100 | Temp 96.8°F

## 2016-12-01 DIAGNOSIS — K253 Acute gastric ulcer without hemorrhage or perforation: Secondary | ICD-10-CM | POA: Diagnosis not present

## 2016-12-01 DIAGNOSIS — R519 Headache, unspecified: Secondary | ICD-10-CM

## 2016-12-01 DIAGNOSIS — R51 Headache: Secondary | ICD-10-CM | POA: Diagnosis not present

## 2016-12-01 DIAGNOSIS — M25571 Pain in right ankle and joints of right foot: Secondary | ICD-10-CM | POA: Diagnosis not present

## 2016-12-01 DIAGNOSIS — G8929 Other chronic pain: Secondary | ICD-10-CM

## 2016-12-01 DIAGNOSIS — Z09 Encounter for follow-up examination after completed treatment for conditions other than malignant neoplasm: Secondary | ICD-10-CM | POA: Diagnosis not present

## 2016-12-01 MED ORDER — TRAMADOL HCL 50 MG PO TABS: 50.0000 mg | ORAL_TABLET | Freq: Three times a day (TID) | ORAL | 0 refills | Status: DC | PRN

## 2016-12-01 NOTE — Progress Notes (Signed)
   Subjective:    Patient ID: Tiffany Stewart, female    DOB: 1971/01/09, 46 y.o.   MRN: [id number]  HPI  Patient comes in by herself today for hospital follow up: - she was in hospital follow up-was admitted for nausea and abdominal pain- dx with gastric ulcer form use of goody powders. - frequent headaches- goody powders were the only thing that would help and now she cannot take anymore- would like to be referred to headache clinic - right ankle pain since [place]- pain is worse at night- hurts  To walk on- rates 5-8/10 at night. Denies injury   Review of Systems  Constitutional: Negative for appetite change.  HENT: Negative.   Respiratory: Negative.   Cardiovascular: Negative.   Gastrointestinal: Negative.   Musculoskeletal:       Right ankle pain  Neurological: Positive for headaches. Negative for dizziness.  Psychiatric/Behavioral: Negative.   All other systems reviewed and are negative.      Objective:   Physical Exam  Constitutional: She is oriented to person, place, and time. She appears well-developed and well-nourished.  HENT:  Nose: Nose normal.  Mouth/Throat: Oropharynx is clear and moist.  Eyes: EOM are normal.  Neck: Trachea normal, normal range of motion and full passive range of motion without pain. Neck supple. No JVD present. Carotid bruit is not present. No thyromegaly present.  Cardiovascular: Normal rate, regular rhythm, normal heart sounds and intact distal pulses.  Exam reveals no gallop and no friction rub.   No murmur heard. Pulmonary/Chest: Effort normal and breath sounds normal.  Abdominal: Soft. Bowel sounds are normal. She exhibits no distension and no mass. There is no tenderness.  Musculoskeletal: Normal range of motion.  Mild right ankle edema- from without pain  Lymphadenopathy:    She has no cervical adenopathy.  Neurological: She is alert and oriented to person, place, and time. She has normal reflexes. No cranial nerve deficit.  Skin:  Skin is warm and dry.  Psychiatric: She has a normal mood and affect. Her behavior is normal. Judgment and thought content normal.   BP (!) 123/93   Pulse 100   Temp (!) 96.8 F (36 C) (Oral)       Assessment & Plan:  1. Hospital discharge follow-up Hospital records reviewed  2. Acute gastric ulcer without hemorrhage or perforation Avoid goody powders Avoid spicy and fatty foods  3. Acute right ankle pain Ice BID Elevate when sitting Wrap with ace wrap - traMADol (ULTRAM) 50 MG tablet; Take 1 tablet (50 mg total) by mouth every 8 (eight) hours as needed.  Dispense: 30 tablet; Refill: 0  4. Chronic nonintractable headache, unspecified headache type Avoid caffeine Keep diary of headaches - AMB referral to headache clinic  [place], FNP

## 2016-12-01 NOTE — Patient Instructions (Signed)
General Headache Without Cause A headache is pain or discomfort felt around the head or neck area. There are many causes and types of headaches. In some cases, the cause may not be found. Follow these instructions at home: Managing pain   Take over-the-counter and prescription medicines only as told by your doctor.  Lie down in a dark, quiet room when you have a headache.  If directed, apply ice to the head and neck area:  Put ice in a plastic bag.  Place a towel between your skin and the bag.  Leave the ice on for 20 minutes, 2-3 times per day.  Use a heating pad or hot shower to apply heat to the head and neck area as told by your doctor.  Keep lights dim if bright lights bother you or make your headaches worse. Eating and drinking   Eat meals on a regular schedule.  Lessen how much alcohol you drink.  Lessen how much caffeine you drink, or stop drinking caffeine. General instructions   Keep all follow-up visits as told by your doctor. This is important.  Keep a journal to find out if certain things bring on headaches. For example, write down:  What you eat and drink.  How much sleep you get.  Any change to your diet or medicines.  Relax by getting a massage or doing other relaxing activities.  Lessen stress.  Sit up straight. Do not tighten (tense) your muscles.  Do not use tobacco products. This includes cigarettes, chewing tobacco, or e-cigarettes. If you need help quitting, ask your doctor.  Exercise regularly as told by your doctor.  Get enough sleep. This often means 7-9 hours of sleep. Contact a doctor if:  Your symptoms are not helped by medicine.  You have a headache that feels different than the other headaches.  You feel sick to your stomach (nauseous) or you throw up (vomit).  You have a fever. Get help right away if:  Your headache becomes really bad.  You keep throwing up.  You have a stiff neck.  You have trouble seeing.  You have  trouble speaking.  You have pain in the eye or ear.  Your muscles are weak or you lose muscle control.  You lose your balance or have trouble walking.  You feel like you will pass out (faint) or you pass out.  You have confusion. This information is not intended to replace advice given to you by your health care provider. Make sure you discuss any questions you have with your health care provider. Document Released: 05/09/2008 Document Revised: 01/06/2016 Document Reviewed: 11/23/2014 Elsevier Interactive Patient Education  2017 [employer].

## 2016-12-04 ENCOUNTER — Telehealth: Payer: Self-pay | Admitting: Nurse Practitioner

## 2016-12-04 ENCOUNTER — Telehealth: Payer: Self-pay

## 2016-12-04 DIAGNOSIS — G8220 Paraplegia, unspecified: Secondary | ICD-10-CM

## 2016-12-06 NOTE — Telephone Encounter (Signed)
Referral made to pain clinic. 

## 2016-12-06 NOTE — Telephone Encounter (Signed)
Aware that dose can not be increased

## 2016-12-06 NOTE — Telephone Encounter (Signed)
cannoyt increase dose- already on to much medication

## 2016-12-13 ENCOUNTER — Other Ambulatory Visit: Payer: Self-pay | Admitting: Nurse Practitioner

## 2016-12-13 DIAGNOSIS — G114 Hereditary spastic paraplegia: Secondary | ICD-10-CM

## 2016-12-14 ENCOUNTER — Encounter: Payer: Self-pay | Admitting: Gastroenterology

## 2016-12-14 ENCOUNTER — Ambulatory Visit: Payer: Medicare Other | Admitting: Gastroenterology

## 2016-12-14 ENCOUNTER — Telehealth: Payer: Self-pay | Admitting: Gastroenterology

## 2016-12-14 NOTE — Telephone Encounter (Signed)
PATIENT WAS A NO SHOW AND LETTER SENT  °

## 2017-01-01 DIAGNOSIS — G43719 Chronic migraine without aura, intractable, without status migrainosus: Secondary | ICD-10-CM | POA: Diagnosis not present

## 2017-01-01 DIAGNOSIS — G4440 Drug-induced headache, not elsewhere classified, not intractable: Secondary | ICD-10-CM | POA: Diagnosis not present

## 2017-01-02 ENCOUNTER — Telehealth: Payer: Self-pay | Admitting: Nurse Practitioner

## 2017-01-02 NOTE — Telephone Encounter (Signed)
Patient aware that referral has been placed to pain clinic. Message sent to Dorothey Baseman to check on progress of getting appt scheduled

## 2017-01-15 ENCOUNTER — Other Ambulatory Visit: Payer: Self-pay | Admitting: Nurse Practitioner

## 2017-01-15 DIAGNOSIS — F411 Generalized anxiety disorder: Secondary | ICD-10-CM

## 2017-01-16 ENCOUNTER — Telehealth: Payer: Self-pay | Admitting: Nurse Practitioner

## 2017-01-16 DIAGNOSIS — F411 Generalized anxiety disorder: Secondary | ICD-10-CM

## 2017-01-16 MED ORDER — ALPRAZOLAM 0.5 MG PO TABS: 0.5000 mg | ORAL_TABLET | Freq: Two times a day (BID) | ORAL | 2 refills | Status: DC | PRN

## 2017-01-16 NOTE — Telephone Encounter (Signed)
Please call in xanax 0.5mg 1 po BID #60 with 1 refills 

## 2017-01-16 NOTE — Telephone Encounter (Signed)
Please advise 

## 2017-01-17 ENCOUNTER — Encounter: Payer: Self-pay | Admitting: Gastroenterology

## 2017-01-17 NOTE — Telephone Encounter (Signed)
Refill called to [facility] 

## 2017-01-17 NOTE — Telephone Encounter (Signed)
LMOVM that refill has been called into pharmacy

## 2017-02-08 ENCOUNTER — Emergency Department (HOSPITAL_COMMUNITY): Payer: Medicare Other

## 2017-02-08 ENCOUNTER — Observation Stay (HOSPITAL_COMMUNITY)
Admission: EM | Admit: 2017-02-08 | Discharge: 2017-02-09 | Disposition: A | Discharge status: Home / Self Care | Payer: Medicare Other | Attending: Internal Medicine | Admitting: Internal Medicine

## 2017-02-08 DIAGNOSIS — G9340 Encephalopathy, unspecified: Secondary | ICD-10-CM | POA: Diagnosis present

## 2017-02-08 DIAGNOSIS — F411 Generalized anxiety disorder: Secondary | ICD-10-CM | POA: Diagnosis present

## 2017-02-08 DIAGNOSIS — F17210 Nicotine dependence, cigarettes, uncomplicated: Secondary | ICD-10-CM | POA: Insufficient documentation

## 2017-02-08 DIAGNOSIS — K219 Gastro-esophageal reflux disease without esophagitis: Secondary | ICD-10-CM | POA: Diagnosis present

## 2017-02-08 DIAGNOSIS — E876 Hypokalemia: Secondary | ICD-10-CM | POA: Diagnosis not present

## 2017-02-08 DIAGNOSIS — Z79899 Other long term (current) drug therapy: Secondary | ICD-10-CM | POA: Insufficient documentation

## 2017-02-08 DIAGNOSIS — T428X1A Poisoning by antiparkinsonism drugs and other central muscle-tone depressants, accidental (unintentional), initial encounter: Secondary | ICD-10-CM | POA: Diagnosis present

## 2017-02-08 DIAGNOSIS — R4182 Altered mental status, unspecified: Secondary | ICD-10-CM | POA: Diagnosis present

## 2017-02-08 DIAGNOSIS — R4020 Unspecified coma: Secondary | ICD-10-CM | POA: Diagnosis not present

## 2017-02-08 DIAGNOSIS — G43101 Migraine with aura, not intractable, with status migrainosus: Secondary | ICD-10-CM | POA: Diagnosis present

## 2017-02-08 DIAGNOSIS — G114 Hereditary spastic paraplegia: Secondary | ICD-10-CM

## 2017-02-08 DIAGNOSIS — R4189 Other symptoms and signs involving cognitive functions and awareness: Secondary | ICD-10-CM

## 2017-02-08 LAB — CBC
HEMATOCRIT: 39.6 % (ref 36.0–46.0)
HEMOGLOBIN: 12.8 g/dL (ref 12.0–15.0)
MCH: 31 pg (ref 26.0–34.0)
MCHC: 32.3 g/dL (ref 30.0–36.0)
MCV: 95.9 fL (ref 78.0–100.0)
Platelets: 172 10*3/uL (ref 150–400)
RBC: 4.13 MIL/uL (ref 3.87–5.11)
RDW: 17.7 % — ABNORMAL HIGH (ref 11.5–15.5)
WBC: 6 10*3/uL (ref 4.0–10.5)

## 2017-02-08 LAB — SALICYLATE LEVEL

## 2017-02-08 LAB — COMPREHENSIVE METABOLIC PANEL
ALBUMIN: 4.3 g/dL (ref 3.5–5.0)
ALK PHOS: 68 U/L (ref 38–126)
ALT: 10 U/L — ABNORMAL LOW (ref 14–54)
ANION GAP: 11 (ref 5–15)
AST: 16 U/L (ref 15–41)
BUN: 17 mg/dL (ref 6–20)
CALCIUM: 9.3 mg/dL (ref 8.9–10.3)
CO2: 23 mmol/L (ref 22–32)
Chloride: 108 mmol/L (ref 101–111)
Creatinine, Ser: 0.37 mg/dL — ABNORMAL LOW (ref 0.44–1.00)
GFR calc non Af Amer: >60 mL/min (ref >60)
GLUCOSE: 105 mg/dL — AB (ref 65–99)
POTASSIUM: 3.1 mmol/L — AB (ref 3.5–5.1)
SODIUM: 142 mmol/L (ref 135–145)
TOTAL PROTEIN: 7.6 g/dL (ref 6.5–8.1)
Total Bilirubin: 0.4 mg/dL (ref 0.3–1.2)

## 2017-02-08 LAB — ACETAMINOPHEN LEVEL

## 2017-02-08 MED ORDER — NALOXONE HCL 2 MG/2ML IJ SOSY
2.0000 mg | PREFILLED_SYRINGE | Freq: Once | INTRAMUSCULAR | Status: AC
  Administered 2017-02-08: 2 mg via INTRAVENOUS
  Filled 2017-02-08: qty 2

## 2017-02-08 NOTE — ED Notes (Signed)
Pt did not respond to Narcan. Still sleeping and only responsive to pain

## 2017-02-08 NOTE — ED Provider Notes (Signed)
[facility] DEPT Provider Note   CSN: [id number] Arrival date & time: 02/08/17  2009     History   Chief Complaint Chief Complaint  Patient presents with  . Altered Mental Status    HPI Tiffany Stewart is a 46 y.o. female.  HPI Patient brought in by EMS. Found unresponsive and EMS called by family members. Last seen normal 45 minutes prior. Narcan given by EMS en route without change in mental status. Level V caveat due to altered mental status. Past Medical History:  Diagnosis Date  . Anxiety   . Chronic pain   . Depression   . GERD (gastroesophageal reflux disease)   . HSP (hereditary spastic paraplegia) ([place])   . Migraines     Patient Active Problem List   Diagnosis Date Noted  . Altered mental status 02/09/2017  . Acute encephalopathy 02/09/2017  . Baclofen overdose 02/09/2017  . Protein-calorie malnutrition, severe 11/15/2016  . Perforated gastric ulcer ([place]) 11/14/2016  . Hypokalemia 11/13/2016  . Iron deficiency anemia 11/13/2016  . Diffuse abdominal pain 11/13/2016  . Failure to thrive in adult 11/13/2016  . Atypical chest pain 11/13/2016  . Generalized weakness   . HSP (hereditary spastic paraplegia) ([place]) 04/27/2014  . TOBACCO ABUSE 11/11/2009  . PARAPLEGIA 11/11/2009  . Anxiety state 03/06/2008  . GERD 03/06/2008    Past Surgical History:  Procedure Laterality Date  . CESAREAN SECTION    . CHOLECYSTECTOMY      OB History    No data available       Home Medications    Prior to Admission medications   Medication Sig Start Date End Date Taking? Authorizing Provider  ALPRAZolam Duanne Moron) 0.5 MG tablet Take 1 tablet (0.5 mg total) by mouth 2 (two) times daily as needed for anxiety. 01/16/17  Yes Hassell Done, Mary-Margaret, FNP  baclofen (LIORESAL) 10 MG tablet TAKE 1 TABLET BY MOUTH TWICE DAILY 03/07/16  Yes Hassell Done, Mary-Margaret, FNP  dicyclomine (BENTYL) 20 MG tablet Take 1 tablet (20 mg total) by mouth every 6 (six) hours. 03/07/16  Yes Hassell Done,  Mary-Margaret, FNP  pantoprazole (PROTONIX) 40 MG tablet Take 1 tablet (40 mg total) by mouth 2 (two) times daily before a meal. 11/16/16  Yes Memon, Jolaine Artist, MD  tiZANidine (ZANAFLEX) 4 MG tablet Take 1 tablet (4 mg total) by mouth 3 (three) times daily. 02/09/17   Kathie Dike, MD    Family History Family History  Problem Relation Age of Onset  . Diabetes Father   . Cancer Father   . Heart disease Father   . Heart disease Maternal Grandmother   . Stroke Maternal Grandfather   . Diabetes Paternal Grandmother     Social History Social History  Substance Use Topics  . Smoking status: Current Every Day Smoker    Packs/day: 1.00    Years: 27.00    Types: Cigarettes  . Smokeless tobacco: Never Used  . Alcohol use No     Allergies   Hctz [hydrochlorothiazide]; Morphine; and Zoloft [sertraline hcl]   Review of Systems Review of Systems  Unable to perform ROS: Mental status change     Physical Exam Updated Vital Signs BP 113/67 (BP Location: Right Arm)   Pulse (!) 108 Comment: p xfer c PT, DOE as baseline   Temp 98 F (36.7 C) (Axillary)   Resp 18   Wt 44.7 kg (98 lb 9.6 oz)   SpO2 99%   BMI 16.41 kg/m   Physical Exam  Constitutional: She appears well-developed and well-nourished.  No distress.  HENT:  Head: Normocephalic and atraumatic.  Mouth/Throat: Oropharynx is clear and moist.  Eyes: EOM are normal.  Pinpoint pupils bilaterally.  Neck: Normal range of motion. Neck supple.  No meningismus  Cardiovascular: Regular rhythm.  Exam reveals no gallop and no friction rub.   No murmur heard. Bradycardia  Pulmonary/Chest: Effort normal and breath sounds normal. No respiratory distress. She has no wheezes. She has no rales. She exhibits no tenderness.  Sonorous respirations  Abdominal: Soft. Bowel sounds are normal. There is no tenderness. There is no rebound and no guarding.  Musculoskeletal: Normal range of motion. She exhibits no edema or tenderness.    Neurological:  Will localize to pain. Patient is not answering questions or following commands. Lying with eyes closed.  Skin: Skin is warm and dry. Capillary refill takes less than 2 seconds. No rash noted. No erythema.  Nursing note and vitals reviewed.    ED Treatments / Results  Labs (all labs ordered are listed, but only abnormal results are displayed) Labs Reviewed  COMPREHENSIVE METABOLIC PANEL - Abnormal; Notable for the following:       Result Value   Potassium 3.1 (*)    Glucose, Bld 105 (*)    Creatinine, Ser 0.37 (*)    ALT 10 (*)    All other components within normal limits  CBC - Abnormal; Notable for the following:    RDW 17.7 (*)    All other components within normal limits  ACETAMINOPHEN LEVEL - Abnormal; Notable for the following:    Acetaminophen (Tylenol), Serum <10 (*)    All other components within normal limits  URINALYSIS, ROUTINE W REFLEX MICROSCOPIC - Abnormal; Notable for the following:    APPearance CLOUDY (*)    Hgb urine dipstick SMALL (*)    Ketones, ur 20 (*)    Bacteria, UA RARE (*)    Squamous Epithelial / LPF 0-5 (*)    All other components within normal limits  RAPID URINE DRUG SCREEN, HOSP PERFORMED  SALICYLATE LEVEL  CBG MONITORING, ED    EKG  EKG Interpretation None       Radiology Ct Head Wo Contrast  Result Date: 02/08/2017 CLINICAL DATA:  Unresponsive and sleepy today EXAM: CT HEAD WITHOUT CONTRAST TECHNIQUE: Contiguous axial images were obtained from the base of the skull through the vertex without intravenous contrast. COMPARISON:  None. FINDINGS: Brain: No acute territorial infarction, hemorrhage or intracranial mass is seen. Mild periventricular white matter hypodensity. Mild atrophy. Ventricles are within normal limits Vascular: No hyperdense vessels.  No unexpected calcification Skull: Normal. Negative for fracture or focal lesion. Sinuses/Orbits: Minimal mucosal thickening in the ethmoid sinuses. No acute orbital  abnormality. Other: None IMPRESSION: No definite CT evidence for acute intracranial abnormality. Electronically Signed   By: Donavan Foil M.D.   On: 02/08/2017 22:41    Procedures Procedures (including critical care time)  Medications Ordered in ED Medications  escitalopram (LEXAPRO) tablet 20 mg (20 mg Oral Given 02/09/17 0930)  lisinopril (PRINIVIL,ZESTRIL) tablet 10 mg (10 mg Oral Given 02/09/17 0930)  acetaminophen (TYLENOL) tablet 650 mg (650 mg Oral Given 02/09/17 0930)    Or  acetaminophen (TYLENOL) suppository 650 mg ( Rectal See Alternative 02/09/17 0930)  ondansetron (ZOFRAN) tablet 4 mg (not administered)    Or  ondansetron (ZOFRAN) injection 4 mg (not administered)  pantoprazole (PROTONIX) injection 40 mg (40 mg Intravenous Given 02/09/17 0455)  enoxaparin (LOVENOX) injection 30 mg (not administered)  naloxone (NARCAN) injection 2 mg (2  mg Intravenous Given 02/08/17 2133)  potassium chloride 10 mEq in 100 mL IVPB (0 mEq Intravenous Stopped 02/09/17 0800)  potassium chloride SA (K-DUR,KLOR-CON) CR tablet 40 mEq (40 mEq Oral Given 02/09/17 1411)  acetaminophen (TYLENOL) tablet 650 mg (650 mg Oral Given 02/09/17 1413)     Initial Impression / Assessment and Plan / ED Course  I have reviewed the triage vital signs and the nursing notes.  Pertinent labs & imaging results that were available during my care of the patient were reviewed by me and considered in my medical decision making (see chart for details).    Patient remains drowsy. Not following commands. Protecting airway. Vital signs are stable. Recent prescription for 60 tablets of 0.5 mg Xanax filled. Suspect benzodiazepine overdose. Narcan given with no response.   Spoke with the patient's mother but was unable to give any information regarding circumstances in which the patient was found unresponsive. Would like to be kept abreast of the patient's condition. Discussed with hospitalist who will see patient in emergency  department and admit for observation. Patient continues to protect airway and has stable vital signs. Final Clinical Impressions(s) / ED Diagnoses   Final diagnoses:  Unresponsiveness  Hypokalemia    New Prescriptions Current Discharge Medication List       Julianne Rice, MD 02/09/17 1544

## 2017-02-08 NOTE — ED Notes (Signed)
Bladder Scan: 160

## 2017-02-08 NOTE — ED Notes (Signed)
Pt back from CT

## 2017-02-08 NOTE — ED Triage Notes (Signed)
Per family members who are pt caregivers, pt is normally awake, alert, active.  Tonight, family members left the patient alone for about 45 minutes and returned to find pt somewhat unresponsive and very sleepy.  Per ems, pt was unresponsive except to painful stimuli and ammonia inhalant.  Family members concerned that patient may have taken something while they were gone.  Pt was given narcan by ems without change in mental status

## 2017-02-08 NOTE — ED Notes (Signed)
Call patient's Mother Voyles) at [phone] if patient will be admitted or will be discharged home.

## 2017-02-09 ENCOUNTER — Encounter (HOSPITAL_COMMUNITY): Payer: Self-pay

## 2017-02-09 DIAGNOSIS — R4189 Other symptoms and signs involving cognitive functions and awareness: Secondary | ICD-10-CM

## 2017-02-09 DIAGNOSIS — E876 Hypokalemia: Secondary | ICD-10-CM

## 2017-02-09 DIAGNOSIS — R4182 Altered mental status, unspecified: Secondary | ICD-10-CM | POA: Diagnosis present

## 2017-02-09 DIAGNOSIS — R401 Stupor: Secondary | ICD-10-CM

## 2017-02-09 DIAGNOSIS — G114 Hereditary spastic paraplegia: Secondary | ICD-10-CM

## 2017-02-09 DIAGNOSIS — F411 Generalized anxiety disorder: Secondary | ICD-10-CM

## 2017-02-09 DIAGNOSIS — G9340 Encephalopathy, unspecified: Secondary | ICD-10-CM

## 2017-02-09 DIAGNOSIS — T428X1A Poisoning by antiparkinsonism drugs and other central muscle-tone depressants, accidental (unintentional), initial encounter: Secondary | ICD-10-CM | POA: Diagnosis present

## 2017-02-09 DIAGNOSIS — G43101 Migraine with aura, not intractable, with status migrainosus: Secondary | ICD-10-CM | POA: Diagnosis present

## 2017-02-09 LAB — URINALYSIS, ROUTINE W REFLEX MICROSCOPIC
Bilirubin Urine: NEGATIVE
Glucose, UA: NEGATIVE mg/dL
Ketones, ur: 20 mg/dL — AB
Leukocytes, UA: NEGATIVE
NITRITE: NEGATIVE
PROTEIN: NEGATIVE mg/dL
SPECIFIC GRAVITY, URINE: 1.016 (ref 1.005–1.030)
pH: 7 (ref 5.0–8.0)

## 2017-02-09 LAB — RAPID URINE DRUG SCREEN, HOSP PERFORMED
AMPHETAMINES: NOT DETECTED
BARBITURATES: NOT DETECTED
Benzodiazepines: NOT DETECTED
COCAINE: NOT DETECTED
OPIATES: NOT DETECTED
Tetrahydrocannabinol: NOT DETECTED

## 2017-02-09 MED ORDER — ONDANSETRON HCL 4 MG PO TABS: 4.0000 mg | ORAL_TABLET | Freq: Four times a day (QID) | ORAL | Status: DC | PRN

## 2017-02-09 MED ORDER — ENOXAPARIN SODIUM 40 MG/0.4ML ~~LOC~~ SOLN
40.0000 mg | SUBCUTANEOUS | Status: DC
  Administered 2017-02-09: 40 mg via SUBCUTANEOUS
  Filled 2017-02-09: qty 0.4

## 2017-02-09 MED ORDER — POTASSIUM CHLORIDE 10 MEQ/100ML IV SOLN
10.0000 meq | INTRAVENOUS | Status: AC
  Administered 2017-02-09 (×3): 10 meq via INTRAVENOUS
  Filled 2017-02-09 (×3): qty 100

## 2017-02-09 MED ORDER — ACETAMINOPHEN 650 MG RE SUPP: 650.0000 mg | Freq: Four times a day (QID) | RECTAL | Status: DC | PRN

## 2017-02-09 MED ORDER — PANTOPRAZOLE SODIUM 40 MG IV SOLR
40.0000 mg | INTRAVENOUS | Status: DC
  Administered 2017-02-09: 40 mg via INTRAVENOUS
  Filled 2017-02-09: qty 40

## 2017-02-09 MED ORDER — ACETAMINOPHEN 325 MG PO TABS
650.0000 mg | ORAL_TABLET | Freq: Once | ORAL | Status: AC
  Administered 2017-02-09: 650 mg via ORAL

## 2017-02-09 MED ORDER — TIZANIDINE HCL 4 MG PO TABS: 4.0000 mg | ORAL_TABLET | Freq: Three times a day (TID) | ORAL | 0 refills | Status: DC

## 2017-02-09 MED ORDER — SODIUM CHLORIDE 0.9 % IV SOLN
INTRAVENOUS | Status: DC
  Administered 2017-02-09: 75 mL via INTRAVENOUS

## 2017-02-09 MED ORDER — ENOXAPARIN SODIUM 30 MG/0.3ML ~~LOC~~ SOLN: 30.0000 mg | SUBCUTANEOUS | Status: DC

## 2017-02-09 MED ORDER — ONDANSETRON HCL 4 MG/2ML IJ SOLN: 4.0000 mg | Freq: Four times a day (QID) | INTRAMUSCULAR | Status: DC | PRN

## 2017-02-09 MED ORDER — LISINOPRIL 10 MG PO TABS
10.0000 mg | ORAL_TABLET | Freq: Every day | ORAL | Status: DC
  Administered 2017-02-09: 10 mg via ORAL
  Filled 2017-02-09: qty 1

## 2017-02-09 MED ORDER — ACETAMINOPHEN 325 MG PO TABS
650.0000 mg | ORAL_TABLET | Freq: Four times a day (QID) | ORAL | Status: DC | PRN
  Administered 2017-02-09: 650 mg via ORAL
  Filled 2017-02-09 (×2): qty 2

## 2017-02-09 MED ORDER — POTASSIUM CHLORIDE CRYS ER 20 MEQ PO TBCR
40.0000 meq | EXTENDED_RELEASE_TABLET | ORAL | Status: AC
  Administered 2017-02-09 (×2): 40 meq via ORAL
  Filled 2017-02-09 (×2): qty 2

## 2017-02-09 MED ORDER — ESCITALOPRAM OXALATE 10 MG PO TABS
20.0000 mg | ORAL_TABLET | Freq: Every day | ORAL | Status: DC
  Administered 2017-02-09: 20 mg via ORAL
  Filled 2017-02-09: qty 2

## 2017-02-09 NOTE — Progress Notes (Signed)
Discharge instructions given, verbalized understanding, out in stable condition via w/c with staff. 

## 2017-02-09 NOTE — Care Management (Signed)
    Durable Medical Equipment        Start     Ordered   02/09/17 1148  For home use only DME 3 n 1  Once     02/09/17 1147   02/09/17 1148  For home use only DME Walker rolling  Once    Question:  Patient needs a walker to treat with the following condition  Answer:  Weakness   02/09/17 1147

## 2017-02-09 NOTE — Evaluation (Signed)
Physical Therapy Evaluation Patient Details Name: Tiffany Stewart MRN: [id number] DOB: 08/27/1970 Today's Date: 02/09/2017   History of Present Illness  Tiffany Stewart is a 46yo white female who comes to [facility] on 6/29 after she was found unresponsive at home by family. Tiffany Stewart tells Tiffany Stewart today that she took 6 baclofen instead of her normal dosage to allieviate acute on chronic exacerbation of BLE pain in legs. PMH: GAD, chronic pain, depression, HSP (hereditary spastic paraplegia). Baseline fucntion includes WC for mobility, modI to supervision for slide transfers bed to/from [facility], WC to/from toilet. Indep in all ADL, but needs help getting down into tub.   Clinical Impression  Tiffany Stewart admitted with above diagnosis. Tiffany Stewart currently with functional limitations due to the deficits listed below (see "Tiffany Stewart Problem List"). Upon entry, the patient is received semirecumbent in bed. Tiffany Stewart performing all bed mobility independently and strong as baseline, and lateral scoot transfers, and stand pivot transfers with supervision, as equipment is slightly different than what she typically uses to transfer. Tiffany Stewart denies and acute weakness, but has had gradual decline in strength and increase in pain over past 3 years. Tiffany Stewart is at baseline, but will DC with RW and BSC, equipment new to her, hence recommending HHPT for education on transfers with this equipment. Tiffany Stewart will benefit from skilled Tiffany Stewart intervention to increase independence and safety with basic mobility in preparation for discharge to the venue listed below. No additional skilled Tiffany Stewart services needed at this time. Tiffany Stewart signing off.      Follow Up Recommendations Home health Tiffany Stewart (education on transfers with RW and [facility] )    Equipment Recommendations  Rolling walker with 5" wheels;3in1 (Tiffany Stewart) (Periatric RW )    Recommendations for Other Services       Precautions / Restrictions Precautions Precautions: Fall      Mobility  Bed Mobility Overal bed mobility: Independent                 Transfers Overall transfer level: Independent Equipment used: None             General transfer comment: lateral scoot transfer x2 bed to chair to chair.   Ambulation/Gait                Stairs            Wheelchair Mobility    Modified Rankin (Stroke Patients Only)       Balance                                             Pertinent Vitals/Pain Pain Assessment: No/denies pain    Home Living Family/patient expects to be discharged to:: Private residence Living Arrangements:  (daughter and niece live with her who assist as needed. ) Available Help at Discharge: Family Type of Home: House Home Access: Stairs to enter   Technical brewer of Steps: 2 landings, niece brings her in/out with WC Home Layout: One level Home Equipment: Hospital bed;Shower seat      Prior Function Level of Independence: Needs assistance;Independent with assistive device(s)   Gait / Transfers Assistance Needed: WC for moblity, lateral scoot for transfers, modI with transfers and bed moblity.   ADL's / Homemaking Assistance Needed: some assistance for set up in bathing, still gets down into tub with +2        Hand Dominance  Extremity/Trunk Assessment   Upper Extremity Assessment Upper Extremity Assessment: Overall WFL for tasks assessed    Lower Extremity Assessment Lower Extremity Assessment:  (chronic paraplegia. )    Cervical / Trunk Assessment Cervical / Trunk Assessment: Normal  Communication   Communication: No difficulties  Cognition Arousal/Alertness: Awake/alert Behavior During Therapy: WFL for tasks assessed/performed Overall Cognitive Status: Within Functional Limits for tasks assessed                                        General Comments      Exercises     Assessment/Plan    Tiffany Stewart Assessment All further Tiffany Stewart needs can be met in the next venue of care  Tiffany Stewart Problem List Decreased knowledge of use  of DME       Tiffany Stewart Treatment Interventions      Tiffany Stewart Goals (Current goals can be found in the Care Plan section)  Acute Rehab Tiffany Stewart Goals Patient Stated Goal: return to home with independent mobility Tiffany Stewart Goal Formulation: All assessment and education complete, DC therapy    Frequency     Barriers to discharge        Co-evaluation               AM-PAC Tiffany Stewart "6 Clicks" Daily Activity  Outcome Measure Difficulty turning over in bed (including adjusting bedclothes, sheets and blankets)?: A Little Difficulty moving from lying on back to sitting on the side of the bed? : A Little Difficulty sitting down on and standing up from a chair with arms (e.g., wheelchair, bedside commode, etc,.)?: Total Help needed moving to and from a bed to chair (including a wheelchair)?: Total Help needed walking in hospital room?: Total Help needed climbing 3-5 steps with a railing? : Total 6 Click Score: 10    End of Session   Activity Tolerance: Patient tolerated treatment well;No increased pain;Patient limited by fatigue Patient left: in chair;with call bell/phone within reach Nurse Communication: Mobility status;Other (comment) Tiffany Stewart Visit Diagnosis: Difficulty in walking, not elsewhere classified (R26.2);Other symptoms and signs involving the nervous system (R29.898)    Time: 1426-1500 Tiffany Stewart Time Calculation (min) (ACUTE ONLY): 34 min   Charges:   Tiffany Stewart Evaluation $Tiffany Stewart Eval Low Complexity: 1 Procedure Tiffany Stewart Treatments $Therapeutic Activity: 8-22 mins   Tiffany Stewart G Codes:   Tiffany Stewart G-Codes **NOT FOR INPATIENT CLASS** Functional Assessment Tool Used: AM-PAC 6 Clicks Basic Mobility Functional Limitation: Mobility: Walking and moving around Mobility: Walking and Moving Around Current Status (P1025): At least 60 percent but less than 80 percent impaired, limited or restricted Mobility: Walking and Moving Around Goal Status [phone]): At least 60 percent but less than 80 percent impaired, limited or restricted Mobility:  Walking and Moving Around Discharge Status [phone]): At least 60 percent but less than 80 percent impaired, limited or restricted    3:36 PM, 02/09/17 Tiffany Stewart, Tiffany Stewart, Tiffany Stewart Physical Therapist - [place] [phone] (Office)   Krystian Younglove C 02/09/2017, 3:33 PM

## 2017-02-09 NOTE — Discharge Summary (Signed)
Physician Discharge Summary  Tiffany Stewart:[id number] DOB: 1970-12-22 DOA: 02/08/2017  PCP: Chevis Pretty, FNP  Admit date: 02/08/2017 Discharge date: 02/09/2017  Admitted From: home Disposition:  home  Recommendations for Outpatient Follow-up:  1. Follow up with PCP in 1-2 weeks 2. Please obtain BMP/CBC in one week 3. Follow up with primary neurologist  Home Health: Equipment/Devices:  Discharge Condition: stable CODE STATUS: full code Diet recommendation: Heart Healthy  Brief/Interim Summary: 46 y/o female with history of hereditary spastic paraplegia reports that she usually takes tizanidine for her muscle pain. She had recently run out of tizanidine and was not able to get a refill. She was told by her physician to take baclofen in place of tizanidine. She took 3 tabs of baclofen and when this didn't help her symptoms, she took 3 more. She subsequently became lethargic. She denied any suicidal ideations. She was brought to ED where she was noted to be lethargic. Work up was unrevealing. She was monitored in the hospital and oral meds were held. She quickly improved and mental status is now back to baseline. She will be discharged with a limited prescription of tizanidine and has been advised not to take further baclofen until she follows up with her primary neurologist.   Discharge Diagnoses:  Active Problems:   Anxiety state   GERD   HSP (hereditary spastic paraplegia) (HCC)   Altered mental status   Acute encephalopathy   Baclofen overdose    Discharge Instructions  Discharge Instructions    Diet - low sodium heart healthy    Complete by:  As directed    Increase activity slowly    Complete by:  As directed      Allergies as of 02/09/2017      Reactions   Hctz [hydrochlorothiazide] Diarrhea   Morphine Other (See Comments)   Flushing, turns skin red    Zoloft [sertraline Hcl] Other (See Comments)   Spaced out      Medication List    STOP taking these  medications   baclofen 10 MG tablet Commonly known as:  LIORESAL     TAKE these medications   ALPRAZolam 0.5 MG tablet Commonly known as:  XANAX Take 1 tablet (0.5 mg total) by mouth 2 (two) times daily as needed for anxiety.   dicyclomine 20 MG tablet Commonly known as:  BENTYL Take 1 tablet (20 mg total) by mouth every 6 (six) hours.   pantoprazole 40 MG tablet Commonly known as:  PROTONIX Take 1 tablet (40 mg total) by mouth 2 (two) times daily before a meal.   tiZANidine 4 MG tablet Commonly known as:  ZANAFLEX Take 1 tablet (4 mg total) by mouth 3 (three) times daily. What changed:  See the new instructions.            Durable Medical Equipment        Start     Ordered   02/09/17 1148  For home use only DME 3 n 1  Once     02/09/17 1147   02/09/17 1148  For home use only DME Walker rolling  Once    Question:  Patient needs a walker to treat with the following condition  Answer:  Weakness   02/09/17 1147      Allergies  Allergen Reactions  . Hctz [Hydrochlorothiazide] Diarrhea  . Morphine Other (See Comments)    Flushing, turns skin red   . Zoloft [Sertraline Hcl] Other (See Comments)    Spaced out  Consultations:     Procedures/Studies: Ct Head Wo Contrast  Result Date: 02/08/2017 CLINICAL DATA:  Unresponsive and sleepy today EXAM: CT HEAD WITHOUT CONTRAST TECHNIQUE: Contiguous axial images were obtained from the base of the skull through the vertex without intravenous contrast. COMPARISON:  None. FINDINGS: Brain: No acute territorial infarction, hemorrhage or intracranial mass is seen. Mild periventricular white matter hypodensity. Mild atrophy. Ventricles are within normal limits Vascular: No hyperdense vessels.  No unexpected calcification Skull: Normal. Negative for fracture or focal lesion. Sinuses/Orbits: Minimal mucosal thickening in the ethmoid sinuses. No acute orbital abnormality. Other: None IMPRESSION: No definite CT evidence for acute  intracranial abnormality. Electronically Signed   By: Donavan Foil M.D.   On: 02/08/2017 22:41      Subjective: Patient is awake and alert. Denies suicidal ideations. Has headache  Discharge Exam: Vitals:   02/09/17 0327 02/09/17 1355  BP: 122/80 113/67  Pulse: 75 88  Resp: 14 18  Temp: 98.6 F (37 C) 98 F (36.7 C)   Vitals:   02/09/17 0130 02/09/17 0200 02/09/17 0327 02/09/17 1355  BP: 129/87 (!) 148/94 122/80 113/67  Pulse: 61 64 75 88  Resp: 15 15 14 18   Temp:   98.6 F (37 C) 98 F (36.7 C)  TempSrc:   Oral Axillary  SpO2: 100% 100% 98% 99%  Weight:   44.7 kg (98 lb 9.6 oz)     General: Pt is alert, awake, not in acute distress Cardiovascular: RRR, S1/S2 +, no rubs, no gallops Respiratory: CTA bilaterally, no wheezing, no rhonchi Abdominal: Soft, NT, ND, bowel sounds + Extremities: no edema, no cyanosis    The results of significant diagnostics from this hospitalization (including imaging, microbiology, ancillary and laboratory) are listed below for reference.     Microbiology: No results found for this or any previous visit (from the past 240 hour(s)).   Labs: BNP (last 3 results) No results for input(s): BNP in the last 8760 hours. Basic Metabolic Panel:  Recent Labs Lab 02/08/17 2129  NA 142  K 3.1*  CL 108  CO2 23  GLUCOSE 105*  BUN 17  CREATININE 0.37*  CALCIUM 9.3   Liver Function Tests:  Recent Labs Lab 02/08/17 2129  AST 16  ALT 10*  ALKPHOS 68  BILITOT 0.4  PROT 7.6  ALBUMIN 4.3   No results for input(s): LIPASE, AMYLASE in the last 168 hours. No results for input(s): AMMONIA in the last 168 hours. CBC:  Recent Labs Lab 02/08/17 2129  WBC 6.0  HGB 12.8  HCT 39.6  MCV 95.9  PLT 172   Cardiac Enzymes: No results for input(s): CKTOTAL, CKMB, CKMBINDEX, TROPONINI in the last 168 hours. BNP: Invalid input(s): POCBNP CBG: No results for input(s): GLUCAP in the last 168 hours. D-Dimer No results for input(s): DDIMER  in the last 72 hours. Hgb A1c No results for input(s): HGBA1C in the last 72 hours. Lipid Profile No results for input(s): CHOL, HDL, LDLCALC, TRIG, CHOLHDL, LDLDIRECT in the last 72 hours. Thyroid function studies No results for input(s): TSH, T4TOTAL, T3FREE, THYROIDAB in the last 72 hours.  Invalid input(s): FREET3 Anemia work up No results for input(s): VITAMINB12, FOLATE, FERRITIN, TIBC, IRON, RETICCTPCT in the last 72 hours. Urinalysis    Component Value Date/Time   COLORURINE YELLOW 02/09/2017 0723   APPEARANCEUR CLOUDY (A) 02/09/2017 0723   APPEARANCEUR Clear 03/07/2016 1046   LABSPEC 1.016 02/09/2017 0723   PHURINE 7.0 02/09/2017 [place] 02/09/2017 0998  HGBUR SMALL (A) 02/09/2017 0723   BILIRUBINUR NEGATIVE 02/09/2017 0723   BILIRUBINUR Negative 03/07/2016 1046   KETONESUR 20 (A) 02/09/2017 0723   PROTEINUR NEGATIVE 02/09/2017 0723   UROBILINOGEN 0.2 03/12/2010 2120   NITRITE NEGATIVE 02/09/2017 0723   LEUKOCYTESUR NEGATIVE 02/09/2017 0723   LEUKOCYTESUR Negative 03/07/2016 1046   Sepsis Labs Invalid input(s): PROCALCITONIN,  WBC,  LACTICIDVEN Microbiology No results found for this or any previous visit (from the past 240 hour(s)).   Time coordinating discharge: Over 30 minutes  SIGNED:   Kathie Dike, MD  [facility] 02/09/2017, 3:15 PM Pager   If 7PM-7AM, please contact night-coverage [email or web address] Password [email or web address]

## 2017-02-09 NOTE — Care Management Obs Status (Signed)
[place] NOTIFICATION   Patient Details  Name: Tiffany Stewart MRN: [id number] Date of Birth: Jan 10, 1971   Medicare Observation Status Notification Given:  Yes    Abir Eroh, Chauncey Reading, RN 02/09/2017, 11:46 AM

## 2017-02-09 NOTE — Care Management Note (Addendum)
Case Management Note  Patient Details  Name: Tiffany Stewart MRN: [id number] Date of Birth: [birth year]  Subjective/Objective:                  Adm with AMS. From home with niece. Mostly ind with ADL's, niece helps out when needed. She has PCP, niece drives her to appointments. She would like a BSC and RW and PT consult. Per staff patient needs a lot of assistance to ambulate.   Action/Plan: Plans to return home at time of discharge. PT consult pending.   ADDENDUM :  Romualdo Bolk of [facility] notified of [facility] PT orders and will obtain orders from chart.   Expected Discharge Date:   02/09/2017               Expected Discharge Plan:     In-House Referral:     Discharge planning Services  CM Consult  Post Acute Care Choice:  Durable Medical Equipment Choice offered to:  Patient  DME Arranged:  3-N-1, Walker rolling DME Agency:  [place]:    [facility] Agency:     Status of Service:  In process, will continue to follow  If discussed at Long Length of Stay Meetings, dates discussed:    Additional Comments:  Tiffany Stewart, Tiffany Reading, RN 02/09/2017, 11:52 AM

## 2017-02-09 NOTE — H&P (Signed)
[facility] H&P    Patient Demographics:    Tiffany Stewart, is a 46 y.o. female  MRN: [id number]  DOB - 1971/02/06  Admit Date - 02/08/2017  Referring MD/NP/PA: Dr Lita Mains  Outpatient Primary MD for the patient is Chevis Pretty, FNP  Patient coming from: Home  Chief Complaint  Patient presents with  . Altered Mental Status      HPI:    Tiffany Stewart  is a 46 y.o. female, With history of depression, anxiety, chronic pain syndrome was found unresponsive at home with family members. Patient was seen normal 45 minutes prior by family members. EMS was called and patient was transferred to emergency room. Patient takes Xanax and family members suspected she may have overdosed on this medication. At this time patient is slowly waking up, unable to provide significant history. Complains of nausea/vomiting. No other history obtainable at this time. In the ED patient received 2 doses of Narcan earlier which did not wake her up. CT head was done which was negative for acute bleed    Review of systems:     Review of systems unobtainable as patient has altered mental status and not able to provide significant history.   With Past History of the following :    Past Medical History:  Diagnosis Date  . Anxiety   . Chronic pain   . Depression   . GERD (gastroesophageal reflux disease)   . HSP (hereditary spastic paraplegia) ([place])   . Migraines       Past Surgical History:  Procedure Laterality Date  . CESAREAN SECTION    . CHOLECYSTECTOMY        Social History:      Social History  Substance Use Topics  . Smoking status: Current Every Day Smoker    Packs/day: 1.00    Years: 27.00    Types: Cigarettes  . Smokeless tobacco: Never Used  . Alcohol use No       Family History :     Family History  Problem Relation Age of Onset  . Diabetes Father   . Cancer Father   . Heart disease Father     . Heart disease Maternal Grandmother   . Stroke Maternal Grandfather   . Diabetes Paternal Grandmother       Home Medications:   Prior to Admission medications   Medication Sig Start Date End Date Taking? Authorizing Provider  ALPRAZolam Duanne Moron) 0.5 MG tablet Take 1 tablet (0.5 mg total) by mouth 2 (two) times daily as needed for anxiety. 01/16/17   Hassell Done Mary-Margaret, FNP  baclofen (LIORESAL) 10 MG tablet TAKE 1 TABLET BY MOUTH TWICE DAILY 03/07/16   Hassell Done, Mary-Margaret, FNP  benzonatate (TESSALON) 200 MG capsule Take 1 capsule (200 mg total) by mouth 2 (two) times daily as needed for cough. 09/25/16   Terald Sleeper, PA-C  dicyclomine (BENTYL) 20 MG tablet Take 1 tablet (20 mg total) by mouth every 6 (six) hours. 03/07/16   Hassell Done, Mary-Margaret, FNP  escitalopram (LEXAPRO) 20 MG tablet Take 1 tablet (20 mg total)  by mouth daily. 06/29/16   Hassell Done Mary-Margaret, FNP  HYDROcodone-acetaminophen (NORCO) 5-325 MG tablet Take 1 tablet by mouth every 6 (six) hours as needed for moderate pain. 11/16/16   Kathie Dike, MD  lisinopril (PRINIVIL,ZESTRIL) 10 MG tablet Take 1 tablet (10 mg total) by mouth daily. 03/07/16   Hassell Done, Mary-Margaret, FNP  pantoprazole (PROTONIX) 40 MG tablet Take 1 tablet (40 mg total) by mouth 2 (two) times daily before a meal. 11/16/16   Kathie Dike, MD  tiZANidine (ZANAFLEX) 4 MG tablet TAKE 1 TABLET BY MOUTH 3 TIMES DAILY. 12/13/16   Hassell Done Mary-Margaret, FNP  traMADol (ULTRAM) 50 MG tablet Take 1 tablet (50 mg total) by mouth every 8 (eight) hours as needed. 12/01/16   Chevis Pretty, FNP     Allergies:     Allergies  Allergen Reactions  . Hctz [Hydrochlorothiazide] Diarrhea  . Morphine Other (See Comments)    Flushing, turns skin red   . Zoloft [Sertraline Hcl] Other (See Comments)    Spaced out     Physical Exam:   Vitals  Blood pressure (!) 152/101, pulse (!) 57, resp. rate 17, SpO2 100 %.  1.  General: Appears lethargic  2.  Psychiatric:  Not tested  3. Neurologic: Moving all extremities  4. Eyes :  anicteric sclerae, moist conjunctivae with no lid lag. PERRLA.  5. ENMT:  Oropharynx clear with moist mucous membranes and good dentition  6. Neck:  supple, no cervical lymphadenopathy appriciated, No thyromegaly  7. Respiratory : Normal respiratory effort, good air movement bilaterally,clear to  auscultation bilaterally  8. Cardiovascular : RRR, no gallops, rubs or murmurs, no leg edema  9. Gastrointestinal:  Positive bowel sounds, abdomen soft, non-tender to palpation,no hepatosplenomegaly, no rigidity or guarding       10. Skin:  No cyanosis, normal texture and turgor, no rash, lesions or ulcers  11.Musculoskeletal:  Good muscle tone,  joints appear normal , no effusions,  normal range of motion    Data Review:    CBC  Recent Labs Lab 02/08/17 2129  WBC 6.0  HGB 12.8  HCT 39.6  PLT 172  MCV 95.9  MCH 31.0  MCHC 32.3  RDW 17.7*   ------------------------------------------------------------------------------------------------------------------  Chemistries   Recent Labs Lab 02/08/17 2129  NA 142  K 3.1*  CL 108  CO2 23  GLUCOSE 105*  BUN 17  CREATININE 0.37*  CALCIUM 9.3  AST 16  ALT 10*  ALKPHOS 68  BILITOT 0.4   ------------------------------------------------------------------------------------------------------------------  ------------------------------------------------------------------------------------------------------------------ GFR: CrCl cannot be calculated (Unknown ideal weight.). Liver Function Tests:  Recent Labs Lab 02/08/17 2129  AST 16  ALT 10*  ALKPHOS 68  BILITOT 0.4  PROT 7.6  ALBUMIN 4.3   No results for input(s): LIPASE, AMYLASE in the last 168 hours. No results for input(s): AMMONIA in the last 168 hours. Coagulation Profile: No results for input(s): INR, PROTIME in the last 168 hours. Cardiac Enzymes: No results for input(s):  CKTOTAL, CKMB, CKMBINDEX, TROPONINI in the last 168 hours. BNP (last 3 results) No results for input(s): PROBNP in the last 8760 hours. HbA1C: No results for input(s): HGBA1C in the last 72 hours. CBG: No results for input(s): GLUCAP in the last 168 hours. Lipid Profile: No results for input(s): CHOL, HDL, LDLCALC, TRIG, CHOLHDL, LDLDIRECT in the last 72 hours. Thyroid Function Tests: No results for input(s): TSH, T4TOTAL, FREET4, T3FREE, THYROIDAB in the last 72 hours. Anemia Panel: No results for input(s): VITAMINB12, FOLATE, FERRITIN, TIBC, IRON, RETICCTPCT in the  last 72 hours.  --------------------------------------------------------------------------------------------------------------- Urine analysis:    Component Value Date/Time   COLORURINE YELLOW 11/13/2016 0120   APPEARANCEUR CLEAR 11/13/2016 0120   APPEARANCEUR Clear 03/07/2016 1046   LABSPEC 1.017 11/13/2016 0120   PHURINE 5.0 11/13/2016 0120   GLUCOSEU NEGATIVE 11/13/2016 0120   HGBUR NEGATIVE 11/13/2016 0120   BILIRUBINUR NEGATIVE 11/13/2016 0120   BILIRUBINUR Negative 03/07/2016 1046   KETONESUR 5 (A) 11/13/2016 0120   PROTEINUR NEGATIVE 11/13/2016 0120   UROBILINOGEN 0.2 03/12/2010 2120   NITRITE NEGATIVE 11/13/2016 0120   LEUKOCYTESUR NEGATIVE 11/13/2016 0120   LEUKOCYTESUR Negative 03/07/2016 1046      Imaging Results:    Ct Head Wo Contrast  Result Date: 02/08/2017 CLINICAL DATA:  Unresponsive and sleepy today EXAM: CT HEAD WITHOUT CONTRAST TECHNIQUE: Contiguous axial images were obtained from the base of the skull through the vertex without intravenous contrast. COMPARISON:  None. FINDINGS: Brain: No acute territorial infarction, hemorrhage or intracranial mass is seen. Mild periventricular white matter hypodensity. Mild atrophy. Ventricles are within normal limits Vascular: No hyperdense vessels.  No unexpected calcification Skull: Normal. Negative for fracture or focal lesion. Sinuses/Orbits: Minimal  mucosal thickening in the ethmoid sinuses. No acute orbital abnormality. Other: None IMPRESSION: No definite CT evidence for acute intracranial abnormality. Electronically Signed   By: Donavan Foil M.D.   On: 02/08/2017 22:41    My personal review of EKG: Rhythm NSR   Assessment & Plan:    Active Problems:   Altered mental status   Hypokalemia  1. Altered mental status-Unclear etiology, likely polypharmacy. Will hold by mouth medications at this time. Once patient is more coherent, consider giving details regarding intentional versus unintentional overdose of medication. Will get sitter at bedside for suicide precautions 2. Hypokalemia-replace potassium IV KCl 10 mg 3. Follow BMP in a.m. 3. Nausea/vomiting- start Zofran 4 mg IV every 6 hours when necessary, Protonix 40 mg IV every 24 hours 4. Hypertension-continue lisinopril 10 mg by mouth daily   DVT Prophylaxis-   Lovenox   AM Labs Ordered, also please review Full Orders  Family Communication: No family present at bedside  Code Status: Presumed full code  Admission status: Observation    Time spent in minutes : 60 minutes   Kinze Labo S M.D on 02/09/2017 at 1:33 AM  Between 7am to 7pm - Pager - [phone]. After 7pm go to [email or web address] - password [email or web address]  [facility] - Office  [phone]

## 2017-02-12 ENCOUNTER — Other Ambulatory Visit: Payer: Self-pay | Admitting: Nurse Practitioner

## 2017-02-12 DIAGNOSIS — G114 Hereditary spastic paraplegia: Secondary | ICD-10-CM

## 2017-02-15 ENCOUNTER — Ambulatory Visit: Payer: Medicare Other | Admitting: Gastroenterology

## 2017-02-27 ENCOUNTER — Ambulatory Visit (INDEPENDENT_AMBULATORY_CARE_PROVIDER_SITE_OTHER): Payer: Medicare Other | Admitting: Nurse Practitioner

## 2017-02-27 ENCOUNTER — Encounter: Payer: Self-pay | Admitting: Nurse Practitioner

## 2017-02-27 VITALS — BP 120/80 | HR 88 | Temp 97.0°F

## 2017-02-27 DIAGNOSIS — G114 Hereditary spastic paraplegia: Secondary | ICD-10-CM

## 2017-02-27 DIAGNOSIS — T50901D Poisoning by unspecified drugs, medicaments and biological substances, accidental (unintentional), subsequent encounter: Secondary | ICD-10-CM | POA: Diagnosis not present

## 2017-02-27 DIAGNOSIS — Z09 Encounter for follow-up examination after completed treatment for conditions other than malignant neoplasm: Secondary | ICD-10-CM

## 2017-02-27 NOTE — Progress Notes (Signed)
   Subjective:    Patient ID: Tiffany Stewart, female    DOB: 09-13-1970, 46 y.o.   MRN: [id number]  HPI Patient comes in today for hospital follow up- [facility] was taken to hospital on 02/08/17 with altered mental status according to family. According to family she was fine 45 minutes prior to finding her unresponsive. SHe was difficult to arouse at hospital. She received two dose of narcan which did not wake her up. CT of head was negative. They think that she may have overdose on her xanax but they are not 100% sure. Patient claims that baclofen was what caused her problem because she had taken 6 in less then an hour. Baclofen was stopped and she was told to contact neurology to get alternative meds.Was referred to neurologist in [place] but she wants to see Dr. Cyril Mourning because that is who she has seen in past.  Review of Systems  Constitutional: Negative.   Respiratory: Negative.   Cardiovascular: Negative.   Genitourinary: Negative.   Musculoskeletal: Positive for myalgias.  Neurological: Negative for dizziness, speech difficulty, weakness and light-headedness.  Psychiatric/Behavioral: Negative.   All other systems reviewed and are negative.      Objective:   Physical Exam  Constitutional: She is oriented to person, place, and time. She appears well-developed and well-nourished. No distress.  Cardiovascular: Normal rate.   Pulmonary/Chest: Effort normal and breath sounds normal.  Neurological: She is alert and oriented to person, place, and time.  Skin: Skin is warm.  Psychiatric: She has a normal mood and affect. Her behavior is normal. Judgment and thought content normal.  Very calm and alert today. Good eye contact Answers all questions apporpriately    BP 120/80   Pulse 88   Temp (!) 97 F (36.1 C) (Oral)       Assessment & Plan:  1. Hospital discharge follow-up Reviewed hospital records  2. Accidental drug overdose, subsequent encounter Do not take meds other then how  they are prescribed  3. HSP (hereditary spastic paraplegia) [facility]) Patient will call with name of neurologist /pain person she wants to see  Follow up for routine medication follow up  [place], FNP

## 2017-03-27 ENCOUNTER — Telehealth: Payer: Self-pay

## 2017-03-27 DIAGNOSIS — G114 Hereditary spastic paraplegia: Secondary | ICD-10-CM

## 2017-03-27 NOTE — Telephone Encounter (Signed)
Wants a referral to [facility] for pain management   Fax # is [phone]

## 2017-04-02 ENCOUNTER — Telehealth: Payer: Self-pay

## 2017-04-02 ENCOUNTER — Other Ambulatory Visit: Payer: Self-pay

## 2017-04-02 ENCOUNTER — Encounter: Payer: Self-pay | Admitting: Gastroenterology

## 2017-04-02 ENCOUNTER — Ambulatory Visit (INDEPENDENT_AMBULATORY_CARE_PROVIDER_SITE_OTHER): Payer: Medicare Other | Admitting: Gastroenterology

## 2017-04-02 VITALS — BP 136/99 | HR 81 | Temp 97.2°F | Ht 64.0 in

## 2017-04-02 DIAGNOSIS — K2100 Gastro-esophageal reflux disease with esophagitis, without bleeding: Secondary | ICD-10-CM

## 2017-04-02 DIAGNOSIS — R1084 Generalized abdominal pain: Secondary | ICD-10-CM | POA: Diagnosis not present

## 2017-04-02 DIAGNOSIS — K255 Chronic or unspecified gastric ulcer with perforation: Secondary | ICD-10-CM

## 2017-04-02 DIAGNOSIS — K219 Gastro-esophageal reflux disease without esophagitis: Secondary | ICD-10-CM

## 2017-04-02 DIAGNOSIS — R109 Unspecified abdominal pain: Secondary | ICD-10-CM

## 2017-04-02 DIAGNOSIS — K921 Melena: Secondary | ICD-10-CM

## 2017-04-02 NOTE — Assessment & Plan Note (Signed)
46 year old female with significantly abnormal stomach and esophagus on CT scan back in April associated with profound iron deficiency anemia. Previous significant aspirin powder use. Suspected perforated gastric ulcer and esophagitis. She presents to schedule EGD for further evaluation of these findings. She reports having melena 2 days ago. Continues to have heartburn in the setting of PPI twice a day. No longer on aspirin powders, denies NSAID use. Plan for EGD with deep sedation in the near future.  I have discussed the risks, alternatives, benefits with regards to but not limited to the risk of reaction to medication, bleeding, infection, perforation and the patient is agreeable to proceed. Written consent to be obtained.  Update CBC given history of melena.  Patient reports previous EGD and colonoscopy 5-6 years ago by Dr.Perry Have not been able to see any records in Epic pertaining to this. We have requested further records from his office.

## 2017-04-02 NOTE — Patient Instructions (Signed)
1. Please have your blood work done to make sure you are not anemic again. 2. Upper endoscopy as scheduled. See separate instructions.

## 2017-04-02 NOTE — Patient Instructions (Signed)
PA info for EGD submitted via [facility] website. No PA needed. Decision ID# [id number].

## 2017-04-02 NOTE — Progress Notes (Signed)
Primary Care Physician:  Chevis Pretty, FNP  Primary Gastroenterologist:  Barney Drain, MD   Chief Complaint  Patient presents with  . ulcer    went to [facility]; Bentyl  . Abdominal Pain  . Gastroesophageal Reflux    HPI:  Tiffany Stewart is a 46 y.o. female here To schedule EGD for history of perforated gastric ulcer. She has never been officially seen by our practice. She was hospitalized in April and Dr. fields was called about an abnormal CT finding, distal esophagus with diffuse wall thickening and paraesophageal tissue stranding, severe focal wall thickening in the stomach antrum and adjacent stomach body with posterior ulceration, equivocal extraluminal gas locally along the ulceration and surrounding inflammatory stranding,? Contained extraluminal gas but no true free air.  We had plans to perform an EGD approximately 4 weeks after this finding the patient had transportation issues and has not been seen until today.   She recalls remote EGD and colonoscopy by Dr. Henrene Pastor more than 5 years ago. I do not see any evidence of this in Epic but they may be in other records. We have requested any potential records.   She states she had been taking a lot of Goody's powders prior to her hospitalization in April. She was self-medicating her migraines. She has since stopped aspirin powders. She is on pantoprazole twice a day. She stop the Bentyl as this didn't seem to be helping with any of her pain. Complains of intermittent abdominal pain now rather than severe daily pain. Pain is kind of in the midline just above the umbilicus. She has heartburn even on PPI twice a day. No dysphagia. She has a bowel movement to 3 times per day from solid to loose. No constipation. No bright red blood per rectum. She describes melena 2 days ago. Denies any recent Pepto use prior to that. She uses that to about once to twice per month however.  When she was in the hospital back in April her hemoglobin was in the 7  range. She did not receive any blood transfusions. Most recent hemoglobin in June was 12.8. Her ferritin was 5 back in March. B12 and folate were normal.  No menstrual cycles status post ablation more than 10 years ago.    Current Outpatient Prescriptions  Medication Sig Dispense Refill  . ALPRAZolam (XANAX) 0.5 MG tablet Take 1 tablet (0.5 mg total) by mouth 2 (two) times daily as needed for anxiety. 60 tablet 2  . dicyclomine (BENTYL) 20 MG tablet Take 1 tablet (20 mg total) by mouth every 6 (six) hours. (Patient taking differently: Take 20 mg by mouth as needed. ) 60 tablet 5  . pantoprazole (PROTONIX) 40 MG tablet Take 1 tablet (40 mg total) by mouth 2 (two) times daily before a meal. 60 tablet 1  . tiZANidine (ZANAFLEX) 4 MG tablet TAKE 1 TABLET BY MOUTH 3 TIMES DAILY. 90 tablet 0   No current facility-administered medications for this visit.     Allergies as of 04/02/2017 - Review Complete 04/02/2017  Allergen Reaction Noted  . Hctz [hydrochlorothiazide] Diarrhea 11/25/2012  . Morphine Other (See Comments)   . Zoloft [sertraline hcl] Other (See Comments) 11/25/2012    Past Medical History:  Diagnosis Date  . Anxiety   . Chronic pain   . Depression   . GERD (gastroesophageal reflux disease)   . HSP (hereditary spastic paraplegia) ([place])   . Migraines     Past Surgical History:  Procedure Laterality Date  . CESAREAN  SECTION    . CHOLECYSTECTOMY    . ENDOMETRIAL ABLATION  2000    Family History  Problem Relation Age of Onset  . Diabetes Father   . Cancer Father        lung cancer  . Heart disease Father   . Heart disease Maternal Grandmother   . Stroke Maternal Grandfather   . Diabetes Paternal Grandmother   . Colon cancer Neg Hx     Social History   Social History  . Marital status: Single    Spouse name: N/A  . Number of children: N/A  . Years of education: N/A   Occupational History  . Not on file.   Social History Main Topics  . Smoking status:  Current Every Day Smoker    Packs/day: 1.00    Years: 27.00    Types: Cigarettes  . Smokeless tobacco: Never Used  . Alcohol use No  . Drug use: No  . Sexual activity: Yes    Birth control/ protection: None   Other Topics Concern  . Not on file   Social History Narrative  . No narrative on file      ROS:  General: Negative for anorexia, weight loss, fever, chills, fatigue, weakness. Eyes: Negative for vision changes.  ENT: Negative for hoarseness, difficulty swallowing , nasal congestion. CV: Negative for chest pain, angina, palpitations, dyspnea on exertion, peripheral edema.  Respiratory: Negative for dyspnea at rest, dyspnea on exertion, cough, sputum, wheezing.  GI: See history of present illness. GU:  Negative for dysuria, hematuria, urinary incontinence, urinary frequency, nocturnal urination.  MS: Unable to ambulate, can transfer from chair to wheelchair etc. Negative for joint pain, low back pain.  Derm: Negative for rash or itching.  Neuro: Negative for weakness, abnormal sensation, seizure, frequent headaches, memory loss, confusion.  Psych: Negative for anxiety, depression, suicidal ideation, hallucinations.  Endo: Negative for unusual weight change.  Heme: Negative for bruising or bleeding. Allergy: Negative for rash or hives.    Physical Examination:  BP (!) 136/99   Pulse 81   Temp (!) 97.2 F (36.2 C) (Oral)   Ht 5\' 4"  (1.626 m)    General: Well-nourished, well-developed in no acute distress.  Head: Normocephalic, atraumatic.   Eyes: Conjunctiva pink, no icterus. Mouth: Oropharyngeal mucosa moist and pink , no lesions erythema or exudate. Neck: Supple without thyromegaly, masses, or lymphadenopathy.  Lungs: Clear to auscultation bilaterally.  Heart: Regular rate and rhythm, no murmurs rubs or gallops.  Abdomen: Had to examine in a wheelchair. Patient stated she was unable to get on the exam table. Bowel sounds are normal, mild tenderness above the  umbilicus, nondistended, no hepatosplenomegaly or masses, no abdominal bruits or    hernia , no rebound or guarding.   Rectal: Not performed Extremities: No lower extremity edema. No clubbing or deformities.  Neuro: Alert and oriented x 4 , grossly normal neurologically.  Skin: Warm and dry, no rash or jaundice.   Psych: Alert and cooperative, normal mood and affect.  Labs: Lab Results  Component Value Date   WBC 6.0 02/08/2017   HGB 12.8 02/08/2017   HCT 39.6 02/08/2017   MCV 95.9 02/08/2017   PLT 172 02/08/2017   Lab Results  Component Value Date   CREATININE 0.37 (L) 02/08/2017   BUN 17 02/08/2017   NA 142 02/08/2017   K 3.1 (L) 02/08/2017   CL 108 02/08/2017   CO2 23 02/08/2017   Lab Results  Component Value Date   ALT  10 (L) 02/08/2017   AST 16 02/08/2017   ALKPHOS 68 02/08/2017   BILITOT 0.4 02/08/2017   Lab Results  Component Value Date   LIPASE 17 11/13/2016     Imaging Studies: No results found.  ADDENDUM REPORT: 11/13/2016 19:37  ADDENDUM: The original report was by Dr. Van Clines. The following addendum is by Dr. Van Clines:  These results were called by telephone at the time of interpretation on 11/13/2016 at 7:37 pm to Dr. Chaney Malling, who verbally acknowledged these results.   Electronically Signed   By: Van Clines M.D.   On: 11/13/2016 19:37   Addended by Sherryl Barters, MD on 11/13/2016 7:40 PM    Study Result   CLINICAL DATA:  Diffuse abdominal pain with nausea and vomiting for 1 day.  EXAM: CT ABDOMEN AND PELVIS WITH CONTRAST  TECHNIQUE: Multidetector CT imaging of the abdomen and pelvis was performed using the standard protocol following bolus administration of intravenous contrast.  CONTRAST:  165mL ISOVUE-300 IOPAMIDOL (ISOVUE-300) INJECTION 61%  COMPARISON:  Multiple exams, including 10/15/2009  FINDINGS: Lower chest: Diffuse wall thickening of the distal esophagus. Suspected  stranding in the periesophageal tissues suggesting local inflammation.  Bandlike density in the right lower lobe terminating in a 0.7 cm pleural based nodule on image 5/5, probably some rounded atelectasis given the comet tail appearance, although not entirely technically specific. No nodule in this vicinity on 10/15/2009.  There is atelectasis peripherally in the left lower lobe as on image 10/5.  Hepatobiliary: Cholecystectomy.  No focal liver lesion identified.  Pancreas: Unremarkable  Spleen: Unremarkable  Adrenals/Urinary Tract: Distended urinary bladder, volume estimated at 840 cc. Kidneys and adrenal glands normal.  Stomach/Bowel: Severe focal wall thickening in the stomach antrum and adjacent stomach body with posterior ulceration, equivocally extraluminal gas locally along the ulceration, and surrounding inflammatory stranding. There could be some contained extraluminal gas at this level but I do not see truly "free" intraperitoneal air.  Vascular/Lymphatic: There are reactive lymph nodes adjacent to the gastric ulcer, for example a mesenteric node measuring 0.8 cm in short axis on image 27/3.  Reproductive: Unremarkable  Other: No supplemental non-categorized findings.  Musculoskeletal: Mildly exaggerated lumbosacral carrying angle.  IMPRESSION: 1. Large ulceration of the posterior gastric body, potentially with some contained micro perforation in this vicinity, and surrounding edema. Adjacent mild reactive nodal prominence. 2. Diffuse wall thickening of the distal esophagus with adjacent periesophageal stranding, favoring esophagitis. 3. 7 mm right lower lobe pleural-based nodule. This is probably mostly due to rounded atelectasis and inflammation given the plugging in the adjacent tracheobronchial tree and linear tail like extension towards the hilum. 4. Distended urinary bladder, volume 840 cc. Radiology assistant personnel have been notified to  put me in telephone contact with the referring physician or the referring physician's clinical representative in order to discuss these findings. Once this communication is established I will issue an addendum to this report for documentation purposes.  Electronically Signed: By: Van Clines M.D. On: 11/13/2016 19:21

## 2017-04-02 NOTE — Telephone Encounter (Signed)
Called and informed pt of pre-op appt 04/18/17 at 2:15pm. Letter mailed.

## 2017-04-02 NOTE — Progress Notes (Signed)
cc'ed to pcp °

## 2017-04-17 ENCOUNTER — Other Ambulatory Visit: Payer: Self-pay | Admitting: Nurse Practitioner

## 2017-04-17 ENCOUNTER — Other Ambulatory Visit: Payer: Self-pay | Admitting: *Deleted

## 2017-04-17 DIAGNOSIS — F411 Generalized anxiety disorder: Secondary | ICD-10-CM

## 2017-04-17 DIAGNOSIS — G114 Hereditary spastic paraplegia: Secondary | ICD-10-CM

## 2017-04-17 MED ORDER — TIZANIDINE HCL 4 MG PO TABS: 4.0000 mg | ORAL_TABLET | Freq: Three times a day (TID) | ORAL | 0 refills | Status: DC

## 2017-04-17 MED ORDER — PANTOPRAZOLE SODIUM 40 MG PO TBEC: 40.0000 mg | DELAYED_RELEASE_TABLET | Freq: Two times a day (BID) | ORAL | 1 refills | Status: DC

## 2017-04-17 NOTE — Progress Notes (Signed)
RXs sent to [facility] per pt request

## 2017-04-18 ENCOUNTER — Telehealth: Payer: Self-pay | Admitting: Gastroenterology

## 2017-04-18 ENCOUNTER — Encounter (HOSPITAL_COMMUNITY)
Admission: RE | Admit: 2017-04-18 | Discharge: 2017-04-18 | Disposition: A | Discharge status: Home / Self Care | Payer: Medicare Other | Source: Ambulatory Visit | Attending: Gastroenterology | Admitting: Gastroenterology

## 2017-04-18 DIAGNOSIS — M542 Cervicalgia: Secondary | ICD-10-CM | POA: Diagnosis not present

## 2017-04-18 DIAGNOSIS — G518 Other disorders of facial nerve: Secondary | ICD-10-CM | POA: Diagnosis not present

## 2017-04-18 DIAGNOSIS — R51 Headache: Secondary | ICD-10-CM | POA: Diagnosis not present

## 2017-04-18 DIAGNOSIS — M791 Myalgia: Secondary | ICD-10-CM | POA: Diagnosis not present

## 2017-04-18 DIAGNOSIS — G43719 Chronic migraine without aura, intractable, without status migrainosus: Secondary | ICD-10-CM | POA: Diagnosis not present

## 2017-04-18 NOTE — Pre-Procedure Instructions (Signed)
Called patient for PAT. She states, " I am having trouble with transportation and I cannot come today for bloodwork. I won't be able to come for my procedure". Instructed patient to call office to notify them She said she would. I called Tiffany Stewart at [facility] and she said she would document this.

## 2017-04-18 NOTE — Telephone Encounter (Signed)
Noted. Please encourage patient to reschedule and to have her labs done.

## 2017-04-18 NOTE — Telephone Encounter (Signed)
Kim from [facility] called to say that patient was not going to have her procedure done due to transportation and for [place] to cancel it.

## 2017-04-18 NOTE — Telephone Encounter (Signed)
Called and informed Endo scheduler to cancel procedure for 04/24/17.  Routing to [facility] as FYI.

## 2017-04-18 NOTE — Telephone Encounter (Signed)
Tried to call pt, no answer, LMOVM and informed her. 

## 2017-04-19 ENCOUNTER — Telehealth: Payer: Self-pay | Admitting: Nurse Practitioner

## 2017-04-19 NOTE — Telephone Encounter (Signed)
Please review and advise.

## 2017-04-19 NOTE — Telephone Encounter (Signed)
Please call in xanx with 1 refills

## 2017-04-19 NOTE — Telephone Encounter (Signed)
What is the name of the medication? xanax  Have you contacted your pharmacy to request a refill? yes  Which pharmacy would you like this sent to? [facility]   Patient notified that their request is being sent to the clinical staff for review and that they should receive a call once it is complete. If they do not receive a call within 24 hours they can check with their pharmacy or our office.

## 2017-04-19 NOTE — Telephone Encounter (Signed)
rx called into pharmacy

## 2017-04-24 ENCOUNTER — Encounter (HOSPITAL_COMMUNITY): Admission: RE | Payer: Self-pay | Source: Ambulatory Visit

## 2017-04-24 ENCOUNTER — Ambulatory Visit (HOSPITAL_COMMUNITY): Admission: RE | Admit: 2017-04-24 | Payer: Medicare Other | Source: Ambulatory Visit | Admitting: Gastroenterology

## 2017-04-24 SURGERY — ESOPHAGOGASTRODUODENOSCOPY (EGD) WITH PROPOFOL
Anesthesia: Monitor Anesthesia Care

## 2017-04-30 ENCOUNTER — Encounter (HOSPITAL_COMMUNITY): Payer: Self-pay | Admitting: Emergency Medicine

## 2017-04-30 DIAGNOSIS — K56609 Unspecified intestinal obstruction, unspecified as to partial versus complete obstruction: Secondary | ICD-10-CM | POA: Diagnosis not present

## 2017-04-30 DIAGNOSIS — Z66 Do not resuscitate: Secondary | ICD-10-CM | POA: Diagnosis present

## 2017-04-30 DIAGNOSIS — E43 Unspecified severe protein-calorie malnutrition: Secondary | ICD-10-CM | POA: Diagnosis present

## 2017-04-30 DIAGNOSIS — D473 Essential (hemorrhagic) thrombocythemia: Secondary | ICD-10-CM | POA: Diagnosis present

## 2017-04-30 DIAGNOSIS — G114 Hereditary spastic paraplegia: Secondary | ICD-10-CM | POA: Diagnosis present

## 2017-04-30 DIAGNOSIS — Z993 Dependence on wheelchair: Secondary | ICD-10-CM

## 2017-04-30 DIAGNOSIS — Z885 Allergy status to narcotic agent status: Secondary | ICD-10-CM

## 2017-04-30 DIAGNOSIS — G8929 Other chronic pain: Secondary | ICD-10-CM | POA: Diagnosis present

## 2017-04-30 DIAGNOSIS — N179 Acute kidney failure, unspecified: Secondary | ICD-10-CM | POA: Diagnosis present

## 2017-04-30 DIAGNOSIS — F411 Generalized anxiety disorder: Secondary | ICD-10-CM | POA: Diagnosis present

## 2017-04-30 DIAGNOSIS — E871 Hypo-osmolality and hyponatremia: Secondary | ICD-10-CM | POA: Diagnosis present

## 2017-04-30 DIAGNOSIS — E860 Dehydration: Secondary | ICD-10-CM | POA: Diagnosis present

## 2017-04-30 DIAGNOSIS — K567 Ileus, unspecified: Secondary | ICD-10-CM | POA: Diagnosis present

## 2017-04-30 DIAGNOSIS — F419 Anxiety disorder, unspecified: Secondary | ICD-10-CM | POA: Diagnosis present

## 2017-04-30 DIAGNOSIS — Z888 Allergy status to other drugs, medicaments and biological substances status: Secondary | ICD-10-CM

## 2017-04-30 DIAGNOSIS — F17210 Nicotine dependence, cigarettes, uncomplicated: Secondary | ICD-10-CM | POA: Diagnosis present

## 2017-04-30 DIAGNOSIS — R112 Nausea with vomiting, unspecified: Secondary | ICD-10-CM | POA: Diagnosis not present

## 2017-04-30 DIAGNOSIS — R339 Retention of urine, unspecified: Secondary | ICD-10-CM | POA: Diagnosis present

## 2017-04-30 DIAGNOSIS — F329 Major depressive disorder, single episode, unspecified: Secondary | ICD-10-CM | POA: Diagnosis present

## 2017-04-30 DIAGNOSIS — K219 Gastro-esophageal reflux disease without esophagitis: Secondary | ICD-10-CM | POA: Diagnosis present

## 2017-04-30 DIAGNOSIS — D509 Iron deficiency anemia, unspecified: Secondary | ICD-10-CM | POA: Diagnosis present

## 2017-04-30 DIAGNOSIS — E876 Hypokalemia: Secondary | ICD-10-CM | POA: Diagnosis present

## 2017-04-30 DIAGNOSIS — Z6820 Body mass index (BMI) 20.0-20.9, adult: Secondary | ICD-10-CM

## 2017-04-30 LAB — COMPREHENSIVE METABOLIC PANEL
ALT: 17 U/L (ref 14–54)
ANION GAP: 17 — AB (ref 5–15)
AST: 17 U/L (ref 15–41)
Albumin: 4.5 g/dL (ref 3.5–5.0)
Alkaline Phosphatase: 93 U/L (ref 38–126)
BUN: 49 mg/dL — ABNORMAL HIGH (ref 6–20)
CHLORIDE: 93 mmol/L — AB (ref 101–111)
CO2: 24 mmol/L (ref 22–32)
Calcium: 9.6 mg/dL (ref 8.9–10.3)
Creatinine, Ser: 1.76 mg/dL — ABNORMAL HIGH (ref 0.44–1.00)
GFR calc non Af Amer: 34 mL/min — ABNORMAL LOW (ref >60)
GFR, EST AFRICAN AMERICAN: 39 mL/min — AB (ref >60)
Glucose, Bld: 157 mg/dL — ABNORMAL HIGH (ref 65–99)
POTASSIUM: 3.1 mmol/L — AB (ref 3.5–5.1)
SODIUM: 134 mmol/L — AB (ref 135–145)
Total Bilirubin: 0.1 mg/dL — ABNORMAL LOW (ref 0.3–1.2)
Total Protein: 9.4 g/dL — ABNORMAL HIGH (ref 6.5–8.1)

## 2017-04-30 LAB — CBC
HEMATOCRIT: 42.8 % (ref 36.0–46.0)
HEMOGLOBIN: 14.6 g/dL (ref 12.0–15.0)
MCH: 34 pg (ref 26.0–34.0)
MCHC: 34.1 g/dL (ref 30.0–36.0)
MCV: 99.8 fL (ref 78.0–100.0)
Platelets: 611 10*3/uL — ABNORMAL HIGH (ref 150–400)
RBC: 4.29 MIL/uL (ref 3.87–5.11)
RDW: 13.3 % (ref 11.5–15.5)
WBC: 9 10*3/uL (ref 4.0–10.5)

## 2017-04-30 LAB — LIPASE, BLOOD: LIPASE: 27 U/L (ref 11–51)

## 2017-04-30 NOTE — ED Triage Notes (Signed)
Pt c/o generalized abd pain with vomiting x one week.

## 2017-05-01 ENCOUNTER — Inpatient Hospital Stay (HOSPITAL_COMMUNITY)
Admission: EM | Admit: 2017-05-01 | Discharge: 2017-05-03 | DRG: 388 | Disposition: A | Discharge status: Home / Self Care | Payer: Medicare Other | Attending: Internal Medicine | Admitting: Internal Medicine

## 2017-05-01 ENCOUNTER — Encounter (HOSPITAL_COMMUNITY): Payer: Self-pay

## 2017-05-01 ENCOUNTER — Observation Stay (HOSPITAL_COMMUNITY): Payer: Medicare Other

## 2017-05-01 ENCOUNTER — Emergency Department (HOSPITAL_COMMUNITY): Payer: Medicare Other

## 2017-05-01 DIAGNOSIS — E43 Unspecified severe protein-calorie malnutrition: Secondary | ICD-10-CM | POA: Diagnosis present

## 2017-05-01 DIAGNOSIS — R627 Adult failure to thrive: Secondary | ICD-10-CM | POA: Diagnosis present

## 2017-05-01 DIAGNOSIS — R109 Unspecified abdominal pain: Secondary | ICD-10-CM

## 2017-05-01 DIAGNOSIS — D473 Essential (hemorrhagic) thrombocythemia: Secondary | ICD-10-CM

## 2017-05-01 DIAGNOSIS — K56609 Unspecified intestinal obstruction, unspecified as to partial versus complete obstruction: Secondary | ICD-10-CM | POA: Diagnosis present

## 2017-05-01 DIAGNOSIS — G8220 Paraplegia, unspecified: Secondary | ICD-10-CM | POA: Diagnosis not present

## 2017-05-01 DIAGNOSIS — E876 Hypokalemia: Secondary | ICD-10-CM | POA: Diagnosis present

## 2017-05-01 DIAGNOSIS — F411 Generalized anxiety disorder: Secondary | ICD-10-CM | POA: Diagnosis present

## 2017-05-01 DIAGNOSIS — F17200 Nicotine dependence, unspecified, uncomplicated: Secondary | ICD-10-CM | POA: Diagnosis not present

## 2017-05-01 DIAGNOSIS — R197 Diarrhea, unspecified: Secondary | ICD-10-CM

## 2017-05-01 DIAGNOSIS — G8929 Other chronic pain: Secondary | ICD-10-CM | POA: Diagnosis present

## 2017-05-01 DIAGNOSIS — Z993 Dependence on wheelchair: Secondary | ICD-10-CM | POA: Diagnosis not present

## 2017-05-01 DIAGNOSIS — N179 Acute kidney failure, unspecified: Secondary | ICD-10-CM | POA: Diagnosis present

## 2017-05-01 DIAGNOSIS — G114 Hereditary spastic paraplegia: Secondary | ICD-10-CM | POA: Diagnosis present

## 2017-05-01 DIAGNOSIS — E871 Hypo-osmolality and hyponatremia: Secondary | ICD-10-CM | POA: Diagnosis present

## 2017-05-01 DIAGNOSIS — R339 Retention of urine, unspecified: Secondary | ICD-10-CM | POA: Diagnosis present

## 2017-05-01 DIAGNOSIS — R059 Cough, unspecified: Secondary | ICD-10-CM

## 2017-05-01 DIAGNOSIS — R938 Abnormal findings on diagnostic imaging of other specified body structures: Secondary | ICD-10-CM

## 2017-05-01 DIAGNOSIS — F17210 Nicotine dependence, cigarettes, uncomplicated: Secondary | ICD-10-CM | POA: Diagnosis present

## 2017-05-01 DIAGNOSIS — D509 Iron deficiency anemia, unspecified: Secondary | ICD-10-CM | POA: Diagnosis present

## 2017-05-01 DIAGNOSIS — K567 Ileus, unspecified: Secondary | ICD-10-CM | POA: Diagnosis present

## 2017-05-01 DIAGNOSIS — R112 Nausea with vomiting, unspecified: Secondary | ICD-10-CM

## 2017-05-01 DIAGNOSIS — R05 Cough: Secondary | ICD-10-CM

## 2017-05-01 DIAGNOSIS — Z66 Do not resuscitate: Secondary | ICD-10-CM | POA: Diagnosis present

## 2017-05-01 DIAGNOSIS — D75839 Thrombocytosis, unspecified: Secondary | ICD-10-CM

## 2017-05-01 DIAGNOSIS — F329 Major depressive disorder, single episode, unspecified: Secondary | ICD-10-CM | POA: Diagnosis present

## 2017-05-01 DIAGNOSIS — K589 Irritable bowel syndrome without diarrhea: Secondary | ICD-10-CM

## 2017-05-01 DIAGNOSIS — Z6820 Body mass index (BMI) 20.0-20.9, adult: Secondary | ICD-10-CM | POA: Diagnosis not present

## 2017-05-01 DIAGNOSIS — Z888 Allergy status to other drugs, medicaments and biological substances status: Secondary | ICD-10-CM | POA: Diagnosis not present

## 2017-05-01 DIAGNOSIS — Z885 Allergy status to narcotic agent status: Secondary | ICD-10-CM | POA: Diagnosis not present

## 2017-05-01 DIAGNOSIS — E860 Dehydration: Secondary | ICD-10-CM | POA: Diagnosis present

## 2017-05-01 DIAGNOSIS — F419 Anxiety disorder, unspecified: Secondary | ICD-10-CM | POA: Diagnosis present

## 2017-05-01 DIAGNOSIS — K219 Gastro-esophageal reflux disease without esophagitis: Secondary | ICD-10-CM | POA: Diagnosis present

## 2017-05-01 LAB — URINALYSIS, ROUTINE W REFLEX MICROSCOPIC
Bacteria, UA: NONE SEEN
Glucose, UA: NEGATIVE mg/dL
HGB URINE DIPSTICK: NEGATIVE
Ketones, ur: NEGATIVE mg/dL
Leukocytes, UA: NEGATIVE
Nitrite: NEGATIVE
Protein, ur: 30 mg/dL — AB
Specific Gravity, Urine: 1.034 — ABNORMAL HIGH (ref 1.005–1.030)
pH: 5 (ref 5.0–8.0)

## 2017-05-01 LAB — BASIC METABOLIC PANEL
ANION GAP: 12 (ref 5–15)
BUN: 47 mg/dL — ABNORMAL HIGH (ref 6–20)
CALCIUM: 8.4 mg/dL — AB (ref 8.9–10.3)
CO2: 24 mmol/L (ref 22–32)
Chloride: 100 mmol/L — ABNORMAL LOW (ref 101–111)
Creatinine, Ser: 1.31 mg/dL — ABNORMAL HIGH (ref 0.44–1.00)
GFR, EST AFRICAN AMERICAN: 56 mL/min — AB (ref >60)
GFR, EST NON AFRICAN AMERICAN: 49 mL/min — AB (ref >60)
GLUCOSE: 118 mg/dL — AB (ref 65–99)
Potassium: 3.4 mmol/L — ABNORMAL LOW (ref 3.5–5.1)
SODIUM: 136 mmol/L (ref 135–145)

## 2017-05-01 LAB — PHOSPHORUS: PHOSPHORUS: 3.7 mg/dL (ref 2.5–4.6)

## 2017-05-01 LAB — CREATININE, URINE, RANDOM: Creatinine, Urine: 166.9 mg/dL

## 2017-05-01 LAB — SODIUM, URINE, RANDOM: SODIUM UR: 15 mmol/L

## 2017-05-01 LAB — CBC
HCT: 39.6 % (ref 36.0–46.0)
Hemoglobin: 13.4 g/dL (ref 12.0–15.0)
MCH: 33.8 pg (ref 26.0–34.0)
MCHC: 33.8 g/dL (ref 30.0–36.0)
MCV: 100 fL (ref 78.0–100.0)
PLATELETS: 470 10*3/uL — AB (ref 150–400)
RBC: 3.96 MIL/uL (ref 3.87–5.11)
RDW: 13.1 % (ref 11.5–15.5)
WBC: 8.4 10*3/uL (ref 4.0–10.5)

## 2017-05-01 LAB — MAGNESIUM: MAGNESIUM: 2.3 mg/dL (ref 1.7–2.4)

## 2017-05-01 MED ORDER — SODIUM CHLORIDE 0.9% FLUSH
3.0000 mL | Freq: Two times a day (BID) | INTRAVENOUS | Status: DC
  Administered 2017-05-01 – 2017-05-02 (×2): 3 mL via INTRAVENOUS

## 2017-05-01 MED ORDER — ONDANSETRON HCL 4 MG/2ML IJ SOLN
4.0000 mg | Freq: Once | INTRAMUSCULAR | Status: AC
  Administered 2017-05-01: 4 mg via INTRAVENOUS
  Filled 2017-05-01: qty 2

## 2017-05-01 MED ORDER — POTASSIUM CHLORIDE 10 MEQ/100ML IV SOLN
10.0000 meq | Freq: Once | INTRAVENOUS | Status: AC
  Administered 2017-05-01: 10 meq via INTRAVENOUS
  Filled 2017-05-01: qty 100

## 2017-05-01 MED ORDER — PANTOPRAZOLE SODIUM 40 MG IV SOLR
40.0000 mg | INTRAVENOUS | Status: DC
  Administered 2017-05-01 – 2017-05-03 (×3): 40 mg via INTRAVENOUS
  Filled 2017-05-01 (×3): qty 40

## 2017-05-01 MED ORDER — HYDRALAZINE HCL 20 MG/ML IJ SOLN: 10.0000 mg | Freq: Three times a day (TID) | INTRAMUSCULAR | Status: DC | PRN

## 2017-05-01 MED ORDER — SODIUM CHLORIDE 0.9 % IV BOLUS (SEPSIS)
1000.0000 mL | Freq: Once | INTRAVENOUS | Status: AC
  Administered 2017-05-01: 1000 mL via INTRAVENOUS

## 2017-05-01 MED ORDER — SODIUM CHLORIDE 0.9 % IV SOLN
INTRAVENOUS | Status: AC
  Administered 2017-05-01: 06:00:00 via INTRAVENOUS

## 2017-05-01 MED ORDER — MORPHINE SULFATE (PF) 2 MG/ML IV SOLN: 2.0000 mg | INTRAVENOUS | Status: DC | PRN

## 2017-05-01 MED ORDER — POTASSIUM CHLORIDE 10 MEQ/100ML IV SOLN
10.0000 meq | INTRAVENOUS | Status: AC
  Administered 2017-05-01 (×2): 10 meq via INTRAVENOUS
  Filled 2017-05-01: qty 100

## 2017-05-01 MED ORDER — ACETAMINOPHEN 650 MG RE SUPP: 650.0000 mg | Freq: Four times a day (QID) | RECTAL | Status: DC | PRN

## 2017-05-01 MED ORDER — LORAZEPAM 2 MG/ML IJ SOLN
0.5000 mg | INTRAMUSCULAR | Status: DC | PRN
  Administered 2017-05-01: 0.5 mg via INTRAVENOUS
  Filled 2017-05-01: qty 1

## 2017-05-01 MED ORDER — NICOTINE 21 MG/24HR TD PT24
21.0000 mg | MEDICATED_PATCH | Freq: Every day | TRANSDERMAL | Status: DC
  Administered 2017-05-01 – 2017-05-03 (×3): 21 mg via TRANSDERMAL
  Filled 2017-05-01 (×3): qty 1

## 2017-05-01 MED ORDER — ONDANSETRON HCL 4 MG/2ML IJ SOLN: 4.0000 mg | Freq: Four times a day (QID) | INTRAMUSCULAR | Status: DC | PRN

## 2017-05-01 MED ORDER — ACETAMINOPHEN 325 MG PO TABS: 650.0000 mg | ORAL_TABLET | Freq: Four times a day (QID) | ORAL | Status: DC | PRN

## 2017-05-01 MED ORDER — ONDANSETRON HCL 4 MG PO TABS: 4.0000 mg | ORAL_TABLET | Freq: Four times a day (QID) | ORAL | Status: DC | PRN

## 2017-05-01 MED ORDER — HYDROMORPHONE HCL 1 MG/ML IJ SOLN
0.2500 mg | INTRAMUSCULAR | Status: DC | PRN
  Administered 2017-05-01 – 2017-05-03 (×9): 0.25 mg via INTRAVENOUS
  Filled 2017-05-01 (×9): qty 1

## 2017-05-01 MED ORDER — LORAZEPAM 2 MG/ML IJ SOLN: 0.5000 mg | Freq: Four times a day (QID) | INTRAMUSCULAR | Status: DC | PRN

## 2017-05-01 MED ORDER — HEPARIN SODIUM (PORCINE) 5000 UNIT/ML IJ SOLN
5000.0000 [IU] | Freq: Three times a day (TID) | INTRAMUSCULAR | Status: DC
  Administered 2017-05-01 – 2017-05-03 (×9): 5000 [IU] via SUBCUTANEOUS
  Filled 2017-05-01 (×8): qty 1

## 2017-05-01 NOTE — Progress Notes (Signed)
Patient stating that she is passing gas frequently.

## 2017-05-01 NOTE — Progress Notes (Signed)
Patient complaining about hunger pains and is currently NPO.  Contacted MD per patients request and asked if diet could be changed to clears.  MD responded and diet still NPO except ice chips.  Patient notified and agreeable.

## 2017-05-01 NOTE — Progress Notes (Signed)
Initial Nutrition Assessment  DOCUMENTATION CODES:  Severe malnutrition in context of acute illness/injury  INTERVENTION:  Pt sounds to have gone about 1 week with minimal nutrition. If patients diet is unable to be advanced within 2-3 days, would recommend short term TPN.   NUTRITION DIAGNOSIS:  Malnutrition (Severe, in acute context) related to vomiting, poor appetite, acute illness, altered GI function (SBO) as evidenced by moderate muscle/fat loss and an estimated intake that has met < /=  50% of needs for >/= 5 days  GOAL:  Patient will meet greater than or equal to 90% of their needs  MONITOR:  PO intake, Supplement acceptance, Diet advancement, Labs  REASON FOR ASSESSMENT:  Consult Assessment of nutrition requirement/status  ASSESSMENT:  46 y/o female PMHx anxiety/depression, GERD, chronic pain,  hereditary spastic paraplegia (unable to stand), tobacco abuse. Presents with reports of vomiting x1 week and an inability to keep food down. Worked up for SBO and AKI. RD consulted for nutritional assessment.  Patient is seen up in bed. She is visibly upset and anxious. She is worried about her current state and doesn't understand why she cannot have jello or ice cream if she can have icechips. She would like someone to call her niece and give her an update. RD relayed this to her RN.   PTA, patient developed the abdominal pain approximately 1 week ago. She says that  she has been able to tolerate small "bites" of items; likely has been unable to consume anywhere near her normal amount.  At baseline, she does not take any supplements or vitamins.   She says her UBW is 110 lbs. Her bed weight today, w/o removing items, is 47 kg, indicating wt loss, however, it appears from chart she weighed less than this earlier this year.   She reports a decreased functional status. At baseline, she is able to transfer herself, however, now she is so weak to the point her family members have to move  her.   RD explained why she is NPO, but this answer did not satisfy her. She has an appetite and desires some sort of food. She again does not want the NGT.   Physical Exam: Displays severe orbital fat wasting, moderate thoracic and underarm fat wasting. Has moderate wasting of deltoids, temporalis and clavicular musculature.   Given her restimated intake that has met </= to 50% of needs for >/= to 5 days and physical exam showing moderate muscle/fat loss, she meets criteria for severe malnutrition. Would be appropriate for quicker initiation of nutritional support. Would recommend within 2-3 days if diet advancement not possible.   Labs: k:3.4, Glu: 118, BUN/Creat:47/1.31 Meds: Zofran, PPI, IVF   Recent Labs Lab 04/30/17 2142 05/01/17 0456  NA 134* 136  K 3.1* 3.4*  CL 93* 100*  CO2 24 24  BUN 49* 47*  CREATININE 1.76* 1.31*  CALCIUM 9.6 8.4*  MG 2.3  --   PHOS 3.7  --   GLUCOSE 157* 118*   Diet Order:  Diet NPO time specified Except for: Ice Chips  Skin:  Reviewed, no issues  Last BM:  9/18-Diarrhea  Height:  Ht Readings from Last 1 Encounters:  04/30/17 '5\' 1"'  (1.549 m)   Weight:  Wt Readings from Last 1 Encounters:  05/01/17 103 lb 9.9 oz (47 kg)   Wt Readings from Last 10 Encounters:  05/01/17 103 lb 9.9 oz (47 kg)  02/09/17 98 lb 9.6 oz (44.7 kg)  11/14/16 96 lb (43.5 kg)  10/29/16 130  lb (59 kg)  02/07/15 130 lb (59 kg)  05/10/12 135 lb (61.2 kg)  11/11/09 136 lb (61.7 kg)  03/10/08 141 lb (64 kg)   Ideal Body Weight:  45.34 kg (Adjusted for paraplegia)  BMI:  Body mass index is 19.58 kg/m.  Estimated Nutritional Needs:  Kcal:  1450-1650 kcals (31-35 kcal/kg bw) Protein:  60-70 g Pro (1.3-1.5 g/kg bw) Fluid:  >1.6 L (35 ml/kg bw)  EDUCATION NEEDS:  No education needs identified at this time  Burtis Junes RD, LDN, [facility] Pager: [phone] 05/01/2017 5:13 PM

## 2017-05-01 NOTE — Progress Notes (Signed)
Attempted to insert NG tube. Pt very anxious. Inserted tube aprox 1 inch into right nostril and met small amount of resistance. Pt stated it hurt, pulled tube out and refused to continue with insertion. Discussed importance of NG tube for SBO but pt continues to refuse insertion. Dr. [place] Pink notified.

## 2017-05-01 NOTE — Consult Note (Signed)
Reason for Consult: Small Bowel Obstruction Referring Physician: Dr. Maudry Mayhew is an 46 y.o. female.  HPI: Tiffany Stewart presents with 1 week history of abdominal pain and nausea/vomiting that has not improved. She underwent a CT scan without contrast that demonstrated a SBO with no definitive transition point. She reports that the pain has continued and has not gotten better. She has been having some BMs, and had one yesterday as well as flatus.  She reports some blood in her stool, but reports a recent colonoscopy that was negative.  She has had a cholecystectomy and C section in the past, but has never suffered from a SBO.  She has been refusing NG tube placement by the RN due to nasal congestion.   Past Medical History:  Diagnosis Date  . Anxiety   . Chronic pain   . Depression   . GERD (gastroesophageal reflux disease)   . HSP (hereditary spastic paraplegia) ([place])   . Migraines     Past Surgical History:  Procedure Laterality Date  . CESAREAN SECTION    . CHOLECYSTECTOMY    . ENDOMETRIAL ABLATION  2000    Family History  Problem Relation Age of Onset  . Diabetes Father   . Cancer Father        lung cancer  . Heart disease Father   . Heart disease Maternal Grandmother   . Stroke Maternal Grandfather   . Diabetes Paternal Grandmother   . Colon cancer Neg Hx     Social History:  reports that she has been smoking Cigarettes.  She has a 27.00 pack-year smoking history. She has never used smokeless tobacco. She reports that she does not drink alcohol or use drugs.  Allergies:  Allergies  Allergen Reactions  . Hctz [Hydrochlorothiazide] Diarrhea  . Morphine Other (See Comments)    Flushing, turns skin red   . Zoloft [Sertraline Hcl] Other (See Comments)    Spaced out    Medications:  Scheduled: . heparin  5,000 Units Subcutaneous Q8H  . nicotine  21 mg Transdermal Daily  . pantoprazole (PROTONIX) IV  40 mg Intravenous Q24H  . sodium chloride flush  3 mL  Intravenous Q12H   Continuous: . sodium chloride 100 mL/hr at 05/01/17 0700   HQP:[id number] **OR** acetaminophen, hydrALAZINE, LORazepam, ondansetron **OR** ondansetron (ZOFRAN) IV  Results for orders placed or performed during the hospital encounter of 05/01/17 (from the past 48 hour(s))  Lipase, blood     Status: None   Collection Time: 04/30/17  9:42 PM  Result Value Ref Range   Lipase 27 11 - 51 U/L  Comprehensive metabolic panel     Status: Abnormal   Collection Time: 04/30/17  9:42 PM  Result Value Ref Range   Sodium 134 (L) 135 - 145 mmol/L   Potassium 3.1 (L) 3.5 - 5.1 mmol/L   Chloride 93 (L) 101 - 111 mmol/L   CO2 24 22 - 32 mmol/L   Glucose, Bld 157 (H) 65 - 99 mg/dL   BUN 49 (H) 6 - 20 mg/dL   Creatinine, Ser 1.76 (H) 0.44 - 1.00 mg/dL   Calcium 9.6 8.9 - 10.3 mg/dL   Total Protein 9.4 (H) 6.5 - 8.1 g/dL   Albumin 4.5 3.5 - 5.0 g/dL   AST 17 15 - 41 U/L   ALT 17 14 - 54 U/L   Alkaline Phosphatase 93 38 - 126 U/L   Total Bilirubin 0.1 (L) 0.3 - 1.2 mg/dL   GFR calc  non Af Amer 34 (L) >60 mL/min   GFR calc Af Amer 39 (L) >60 mL/min    Comment: (NOTE) The eGFR has been calculated using the CKD EPI equation. This calculation has not been validated in all clinical situations. eGFR's persistently <60 mL/min signify possible Chronic Kidney Disease.    Anion gap 17 (H) 5 - 15  CBC     Status: Abnormal   Collection Time: 04/30/17  9:42 PM  Result Value Ref Range   WBC 9.0 4.0 - 10.5 K/uL   RBC 4.29 3.87 - 5.11 MIL/uL   Hemoglobin 14.6 12.0 - 15.0 g/dL   HCT 42.8 36.0 - 46.0 %   MCV 99.8 78.0 - 100.0 fL   MCH 34.0 26.0 - 34.0 pg   MCHC 34.1 30.0 - 36.0 g/dL   RDW 13.3 11.5 - 15.5 %   Platelets 611 (H) 150 - 400 K/uL  Magnesium     Status: None   Collection Time: 04/30/17  9:42 PM  Result Value Ref Range   Magnesium 2.3 1.7 - 2.4 mg/dL  Phosphorus     Status: None   Collection Time: 04/30/17  9:42 PM  Result Value Ref Range   Phosphorus 3.7 2.5 - 4.6  mg/dL  Urinalysis, Routine w reflex microscopic     Status: Abnormal   Collection Time: 05/01/17  1:50 AM  Result Value Ref Range   Color, Urine YELLOW YELLOW   APPearance HAZY (A) CLEAR   Specific Gravity, Urine 1.034 (H) 1.005 - 1.030   pH 5.0 5.0 - 8.0   Glucose, UA NEGATIVE NEGATIVE mg/dL   Hgb urine dipstick NEGATIVE NEGATIVE   Bilirubin Urine SMALL (A) NEGATIVE   Ketones, ur NEGATIVE NEGATIVE mg/dL   Protein, ur 30 (A) NEGATIVE mg/dL   Nitrite NEGATIVE NEGATIVE   Leukocytes, UA NEGATIVE NEGATIVE   RBC / HPF 0-5 0 - 5 RBC/hpf   WBC, UA 0-5 0 - 5 WBC/hpf   Bacteria, UA NONE SEEN NONE SEEN   Squamous Epithelial / LPF 0-5 (A) NONE SEEN   Mucus PRESENT    Budding Yeast PRESENT    Hyaline Casts, UA PRESENT   Sodium, urine, random     Status: None   Collection Time: 05/01/17  1:50 AM  Result Value Ref Range   Sodium, Ur 15 mmol/L  Creatinine, urine, random     Status: None   Collection Time: 05/01/17  1:50 AM  Result Value Ref Range   Creatinine, Urine 166.90 mg/dL  CBC     Status: Abnormal   Collection Time: 05/01/17  4:56 AM  Result Value Ref Range   WBC 8.4 4.0 - 10.5 K/uL   RBC 3.96 3.87 - 5.11 MIL/uL   Hemoglobin 13.4 12.0 - 15.0 g/dL   HCT 39.6 36.0 - 46.0 %   MCV 100.0 78.0 - 100.0 fL   MCH 33.8 26.0 - 34.0 pg   MCHC 33.8 30.0 - 36.0 g/dL   RDW 13.1 11.5 - 15.5 %   Platelets 470 (H) 150 - 400 K/uL  Basic metabolic panel     Status: Abnormal   Collection Time: 05/01/17  4:56 AM  Result Value Ref Range   Sodium 136 135 - 145 mmol/L   Potassium 3.4 (L) 3.5 - 5.1 mmol/L   Chloride 100 (L) 101 - 111 mmol/L   CO2 24 22 - 32 mmol/L   Glucose, Bld 118 (H) 65 - 99 mg/dL   BUN 47 (H) 6 - 20 mg/dL  Creatinine, Ser 1.31 (H) 0.44 - 1.00 mg/dL   Calcium 8.4 (L) 8.9 - 10.3 mg/dL   GFR calc non Af Amer 49 (L) >60 mL/min   GFR calc Af Amer 56 (L) >60 mL/min    Comment: (NOTE) The eGFR has been calculated using the CKD EPI equation. This calculation has not been  validated in all clinical situations. eGFR's persistently <60 mL/min signify possible Chronic Kidney Disease.    Anion gap 12 5 - 15    Ct Abdomen Pelvis Wo Contrast  Result Date: 05/01/2017 CLINICAL DATA:  Nausea, vomiting, abdominal pain. Bowel obstruction. EXAM: CT ABDOMEN AND PELVIS WITHOUT CONTRAST TECHNIQUE: Multidetector CT imaging of the abdomen and pelvis was performed following the standard protocol without IV contrast. COMPARISON:  Plain films 05/01/2017 FINDINGS: Lower chest: Lung bases are clear. No effusions. Heart is normal size. Hepatobiliary: Prior cholecystectomy.  No focal hepatic abnormality. Pancreas: No focal abnormality or ductal dilatation. Spleen: No focal abnormality.  Normal size. Adrenals/Urinary Tract: Punctate nonobstructing stone in the upper pole of the left kidney. No hydronephrosis. Adrenal glands and urinary bladder unremarkable. Stomach/Bowel: Dilated fluid-filled small bowel loops are noted with air-fluid levels compatible with high-grade small bowel obstruction. Distal small bowel loops are decompressed. Exact transition is not visualized. Colon is decompressed, grossly unremarkable. Vascular/Lymphatic: No evidence of aneurysm or adenopathy. Reproductive: Uterus and adnexa unremarkable.  No mass. Other: No free fluid or free air. Musculoskeletal: No acute bony abnormality. IMPRESSION: Dilated small bowel loops with air-fluid levels compatible with high-grade small bowel obstruction. Distal small bowel is decompressed. Exact transition point and cause of the obstruction not visualized. Electronically Signed   By: Rolm Baptise M.D.   On: 05/01/2017 08:29   Dg Chest Port 1 View  Result Date: 05/01/2017 CLINICAL DATA:  Vomiting for about a week. Generalized abdominal pain today. Chest pain, shortness of breath, and cough. Current smoker. EXAM: PORTABLE CHEST 1 VIEW COMPARISON:  10/30/2016 FINDINGS: Shallow inspiration with probable linear atelectasis in the left lung  base. Normal heart size and pulmonary vascularity. No focal airspace disease or consolidation in the lungs. No blunting of costophrenic angles. No pneumothorax. Mediastinal contours appear intact. IMPRESSION: Shallow inspiration with linear atelectasis in the left lung base. Electronically Signed   By: Lucienne Capers M.D.   On: 05/01/2017 04:02   Dg Abd Portable 2v  Result Date: 05/01/2017 CLINICAL DATA:  Vomiting and abdominal pain. EXAM: PORTABLE ABDOMEN - 2 VIEW COMPARISON:  CT abdomen and pelvis 11/13/2016.  Abdomen 03/12/2010. FINDINGS: Gas distended upper and mid abdominal small bowel likely representing small bowel obstruction. Colon appears decompressed. No free air in the abdomen. No radiopaque stones. Surgical clips in the right upper quadrant. Visualized bones appear intact. IMPRESSION: Gas-filled distended small bowel likely representing small bowel obstruction. Electronically Signed   By: Lucienne Capers M.D.   On: 05/01/2017 04:03   Personally reviewed images and note dilated small bowel, no obvious transition point, difficult to follow, decompressed colon and rectal vault  ROS:  A comprehensive review of systems was negative except for: Gastrointestinal: positive for abdominal pain, constipation, nausea, vomiting and blood in stools Genitourinary: positive for hesitancy  Blood pressure (!) 156/102, pulse 79, temperature 98.6 F (37 C), temperature source Oral, resp. rate 18, height '5\' 1"'  (1.549 m), weight 110 lb (49.9 kg), SpO2 98 %. Physical Exam  Constitutional: She is oriented to person, place, and time and well-developed, well-nourished, and in no distress.  HENT:  Head: Normocephalic and atraumatic.  Eyes: Pupils  are equal, round, and reactive to light.  Cardiovascular: Normal rate and regular rhythm.   Pulmonary/Chest: Effort normal and breath sounds normal.  Abdominal: Soft. She exhibits distension. There is tenderness. There is no rebound and no guarding.   Musculoskeletal:  Some degree of contracture to left leg, difficult to assess    Neurological: She is alert and oriented to person, place, and time.  Skin: Skin is warm and dry.  Psychiatric: Affect and judgment normal.     Assessment/Plan: Ms. Shingledecker is a 46 yo female with a SBO on CT scan with limited ability to assess due to the lack of contrast.  Patient having some BMs/ flatus per her report, but refuses the NG tube despite multiple attempts to explain need and importance. -Recommend NPO, NG if able, ice chips for comfort if needed, limit quantiy -Correct lytes K 4, Phos 3, Mg 2 -IVF -Will monitor, if WBC worsens or exam worsens may need surgery ultimate, will attempt non operative management at this time -May need SBFT in the next few days if does not open up  [name] 05/01/2017, 1:47 PM

## 2017-05-01 NOTE — H&P (Signed)
[facility] History and Physical  Tiffany Stewart JIR:[id number] DOB: 03/23/1971 DOA: 05/01/2017  Referring physician:  PCP: Chevis Pretty, FNP   Chief Complaint: N/V  HPI: Tiffany Stewart is a 46 y.o. female  with past medical history of anxiety, chronic pain, depression, hereditary spastic paraplegia, migraines and depression. Patient states she's been vomiting for a week. Has difficulty keeping food down. Says her vomiting is due to pain. Denies sick contacts. No recent medication changes.  ED course: Nausea vomiting control. Hospitalist consulted for admission due to acute kidney injury.   Review of Systems:  As per HPI otherwise 10 point review of systems negative.    Past Medical History:  Diagnosis Date  . Anxiety   . Chronic pain   . Depression   . GERD (gastroesophageal reflux disease)   . HSP (hereditary spastic paraplegia) ([place])   . Migraines    Past Surgical History:  Procedure Laterality Date  . CESAREAN SECTION    . CHOLECYSTECTOMY    . ENDOMETRIAL ABLATION  2000   Social History:  reports that she has been smoking Cigarettes.  She has a 27.00 pack-year smoking history. She has never used smokeless tobacco. She reports that she does not drink alcohol or use drugs.  Allergies  Allergen Reactions  . Hctz [Hydrochlorothiazide] Diarrhea  . Morphine Other (See Comments)    Flushing, turns skin red   . Zoloft [Sertraline Hcl] Other (See Comments)    Spaced out    Family History  Problem Relation Age of Onset  . Diabetes Father   . Cancer Father        lung cancer  . Heart disease Father   . Heart disease Maternal Grandmother   . Stroke Maternal Grandfather   . Diabetes Paternal Grandmother   . Colon cancer Neg Hx      Prior to Admission medications   Medication Sig Start Date End Date Taking? Authorizing Provider  ALPRAZolam (XANAX) 0.5 MG tablet TAKE 1 TABLET BY MOUTH TWICE DAILY AS NEEDED FOR ANXIETY. 04/19/17   Hassell Done, Mary-Margaret, FNP    dicyclomine (BENTYL) 20 MG tablet Take 1 tablet (20 mg total) by mouth every 6 (six) hours. Patient taking differently: Take 20 mg by mouth as needed.  03/07/16   Hassell Done, Mary-Margaret, FNP  pantoprazole (PROTONIX) 40 MG tablet Take 1 tablet (40 mg total) by mouth 2 (two) times daily before a meal. 04/17/17   Hassell Done, Mary-Margaret, FNP  tiZANidine (ZANAFLEX) 4 MG tablet Take 1 tablet (4 mg total) by mouth 3 (three) times daily. 04/17/17   Chevis Pretty, FNP   Physical Exam: Vitals:   04/30/17 2132 05/01/17 0103 05/01/17 0230 05/01/17 0300  BP: (!) 158/108 (!) 178/127 (!) 142/93 123/83  Pulse: 81 95 65 67  Resp: 17 20    Temp: 97.8 F (36.6 C)     SpO2: 98% 98% 98% 96%  Weight: 49.9 kg (110 lb)     Height: 5\' 1"  (1.549 m)       Wt Readings from Last 3 Encounters:  04/30/17 49.9 kg (110 lb)  02/09/17 44.7 kg (98 lb 9.6 oz)  11/14/16 43.5 kg (96 lb)    General:  Appears calm and comfortable; A&Ox3 Eyes:  PERRL, EOMI, normal lids, iris ENT:  grossly normal hearing, lips & tongue Neck:  no LAD, masses or thyromegaly Cardiovascular:  RRR, no m/r/g. No LE edema.  Respiratory:  CTA bilaterally, no w/r/r. Normal respiratory effort. Abdomen:  Rigid, nd, pos BS Skin:  no  rash or induration seen on limited exam Musculoskeletal:  grossly abn tone BUE/BLE Psychiatric:  grossly normal mood and affect, speech fluent and appropriate Neurologic:  CN 2-12 grossly intact, moves all extremities in coordinated fashion.          Labs on Admission:  Basic Metabolic Panel:  Recent Labs Lab 04/30/17 2142  NA 134*  K 3.1*  CL 93*  CO2 24  GLUCOSE 157*  BUN 49*  CREATININE 1.76*  CALCIUM 9.6   Liver Function Tests:  Recent Labs Lab 04/30/17 2142  AST 17  ALT 17  ALKPHOS 93  BILITOT 0.1*  PROT 9.4*  ALBUMIN 4.5    Recent Labs Lab 04/30/17 2142  LIPASE 27   No results for input(s): AMMONIA in the last 168 hours. CBC:  Recent Labs Lab 04/30/17 2142  WBC 9.0  HGB  14.6  HCT 42.8  MCV 99.8  PLT 611*   Cardiac Enzymes: No results for input(s): CKTOTAL, CKMB, CKMBINDEX, TROPONINI in the last 168 hours.  BNP (last 3 results) No results for input(s): BNP in the last 8760 hours.  ProBNP (last 3 results) No results for input(s): PROBNP in the last 8760 hours.   Serum creatinine: 1.76 mg/dL (H) 04/30/17 2142 Estimated creatinine clearance: 30.8 mL/min (A)  CBG: No results for input(s): GLUCAP in the last 168 hours.  Radiological Exams on Admission: Dg Chest Port 1 View  Result Date: 05/01/2017 CLINICAL DATA:  Vomiting for about a week. Generalized abdominal pain today. Chest pain, shortness of breath, and cough. Current smoker. EXAM: PORTABLE CHEST 1 VIEW COMPARISON:  10/30/2016 FINDINGS: Shallow inspiration with probable linear atelectasis in the left lung base. Normal heart size and pulmonary vascularity. No focal airspace disease or consolidation in the lungs. No blunting of costophrenic angles. No pneumothorax. Mediastinal contours appear intact. IMPRESSION: Shallow inspiration with linear atelectasis in the left lung base. Electronically Signed   By: Lucienne Capers M.D.   On: 05/01/2017 04:02   Dg Abd Portable 2v  Result Date: 05/01/2017 CLINICAL DATA:  Vomiting and abdominal pain. EXAM: PORTABLE ABDOMEN - 2 VIEW COMPARISON:  CT abdomen and pelvis 11/13/2016.  Abdomen 03/12/2010. FINDINGS: Gas distended upper and mid abdominal small bowel likely representing small bowel obstruction. Colon appears decompressed. No free air in the abdomen. No radiopaque stones. Surgical clips in the right upper quadrant. Visualized bones appear intact. IMPRESSION: Gas-filled distended small bowel likely representing small bowel obstruction. Electronically Signed   By: Lucienne Capers M.D.   On: 05/01/2017 04:03    EKG: no new  Assessment/Plan Principal Problem:   AKI (acute kidney injury) ([place]) Active Problems:   HSP (hereditary spastic paraplegia) ([facility])    Hypokalemia   Hyponatremia   AKI Baseline Cr <1, Cr on admit 1.76 1076ml of normal saline given in the emergency room Gentle hydration overnight Checking magnesium and phosphorus Urine labs ordered to calculate fractional excretion of NA   Abn KUB Likely SBO  CT abdomen to look for cause of obstruction Not actively vomiting currently so NGT for now  Low K Replete and recheck  Low NA Due to dehydration IVF No neuro deficits  HSP Hold zanaflex  GERD PPI  Depression No SI/HI  Anxiety Prn ativan IV Hold oral xanax    Code Status: DNR  DVT Prophylaxis: heparin Family Communication: none at bedside Disposition Plan: Pending Improvement  Status: obs tele  Elwin Mocha, MD Family Medicine [facility] [email or web address] Password [email or web address]

## 2017-05-01 NOTE — ED Provider Notes (Signed)
[facility] DEPT Provider Note   CSN: [id number] Arrival date & time: 04/30/17  2109     History   Chief Complaint Chief Complaint  Patient presents with  . Abdominal Pain    HPI Tiffany Stewart is a 46 y.o. female.  The history is provided by the patient.  Abdominal Pain    She has been vomiting for about the last week. She started having generalized abdominal pain today.Pain is severe, but she cannot put a number on it. There is no improvement in pain following vomiting. She denies fever or chills. She denies diarrhea. Nothing makes symptoms better, nothing makes it worse.  Past Medical History:  Diagnosis Date  . Anxiety   . Chronic pain   . Depression   . GERD (gastroesophageal reflux disease)   . HSP (hereditary spastic paraplegia) ([place])   . Migraines     Patient Active Problem List   Diagnosis Date Noted  . Melena 04/02/2017  . Altered mental status 02/09/2017  . Acute encephalopathy 02/09/2017  . Baclofen overdose 02/09/2017  . Protein-calorie malnutrition, severe 11/15/2016  . Perforated gastric ulcer ([place]) 11/14/2016  . Hypokalemia 11/13/2016  . Iron deficiency anemia 11/13/2016  . Diffuse abdominal pain 11/13/2016  . Failure to thrive in adult 11/13/2016  . Atypical chest pain 11/13/2016  . Generalized weakness   . HSP (hereditary spastic paraplegia) ([place]) 04/27/2014  . TOBACCO ABUSE 11/11/2009  . PARAPLEGIA 11/11/2009  . Abdominal pain 11/11/2009  . Anxiety state 03/06/2008  . GERD 03/06/2008    Past Surgical History:  Procedure Laterality Date  . CESAREAN SECTION    . CHOLECYSTECTOMY    . ENDOMETRIAL ABLATION  2000    OB History    No data available       Home Medications    Prior to Admission medications   Medication Sig Start Date End Date Taking? Authorizing Provider  ALPRAZolam (XANAX) 0.5 MG tablet TAKE 1 TABLET BY MOUTH TWICE DAILY AS NEEDED FOR ANXIETY. 04/19/17   Hassell Done, Mary-Margaret, FNP  dicyclomine (BENTYL) 20 MG tablet  Take 1 tablet (20 mg total) by mouth every 6 (six) hours. Patient taking differently: Take 20 mg by mouth as needed.  03/07/16   Hassell Done, Mary-Margaret, FNP  pantoprazole (PROTONIX) 40 MG tablet Take 1 tablet (40 mg total) by mouth 2 (two) times daily before a meal. 04/17/17   Hassell Done, Mary-Margaret, FNP  tiZANidine (ZANAFLEX) 4 MG tablet Take 1 tablet (4 mg total) by mouth 3 (three) times daily. 04/17/17   Chevis Pretty, FNP    Family History Family History  Problem Relation Age of Onset  . Diabetes Father   . Cancer Father        lung cancer  . Heart disease Father   . Heart disease Maternal Grandmother   . Stroke Maternal Grandfather   . Diabetes Paternal Grandmother   . Colon cancer Neg Hx     Social History Social History  Substance Use Topics  . Smoking status: Current Every Day Smoker    Packs/day: 1.00    Years: 27.00    Types: Cigarettes  . Smokeless tobacco: Never Used  . Alcohol use No     Allergies   Hctz [hydrochlorothiazide]; Morphine; and Zoloft [sertraline hcl]   Review of Systems Review of Systems  Gastrointestinal: Positive for abdominal pain.  All other systems reviewed and are negative.    Physical Exam Updated Vital Signs BP (!) 178/127 (BP Location: Left Arm) Comment: pt vomiting  Pulse 95  Temp 97.8 F (36.6 C)   Resp 20   Ht 5\' 1"  (1.549 m)   Wt 49.9 kg (110 lb)   SpO2 98%   BMI 20.78 kg/m   Physical Exam  Nursing note and vitals reviewed.  Frail appearing 46 year old female, resting comfortably and in no acute distress. Vital signs are significant for hypertension. Oxygen saturation is 98%, which is normal. Head is normocephalic and atraumatic. PERRLA, EOMI. Oropharynx is clear. Mucous membranes are dry. Neck is nontender and supple without adenopathy or JVD. Back is nontender and there is no CVA tenderness. Lungs are clear without rales, wheezes, or rhonchi. Chest is nontender. Heart has regular rate and rhythm without  murmur. Abdomen is soft, flat, nontender without masses or hepatosplenomegaly and peristalsis is hypoactive. Extremities have no cyanosis or edema, full range of motion is present. Skin is warm and dry without rash. Neurologic: Mental status is normal, cranial nerves are intact. She Is generally weak, with paraparesis present.  ED Treatments / Results  Labs (all labs ordered are listed, but only abnormal results are displayed) Labs Reviewed  COMPREHENSIVE METABOLIC PANEL - Abnormal; Notable for the following:       Result Value   Sodium 134 (*)    Potassium 3.1 (*)    Chloride 93 (*)    Glucose, Bld 157 (*)    BUN 49 (*)    Creatinine, Ser 1.76 (*)    Total Protein 9.4 (*)    Total Bilirubin 0.1 (*)    GFR calc non Af Amer 34 (*)    GFR calc Af Amer 39 (*)    Anion gap 17 (*)    All other components within normal limits  CBC - Abnormal; Notable for the following:    Platelets 611 (*)    All other components within normal limits  URINALYSIS, ROUTINE W REFLEX MICROSCOPIC - Abnormal; Notable for the following:    APPearance HAZY (*)    Specific Gravity, Urine 1.034 (*)    Bilirubin Urine SMALL (*)    Protein, ur 30 (*)    Squamous Epithelial / LPF 0-5 (*)    All other components within normal limits  LIPASE, BLOOD     Procedures Procedures (including critical care time)  Medications Ordered in ED Medications  ondansetron (ZOFRAN) injection 4 mg (4 mg Intravenous Given 05/01/17 0150)     Initial Impression / Assessment and Plan / ED Course  I have reviewed the triage vital signs and the nursing notes.  Pertinent labs & imaging results that were available during my care of the patient were reviewed by me and considered in my medical decision making (see chart for details).  Nausea and vomiting with dehydration. Abdominal pain with benign abdominal exam. WBC is normal. Metabolic panel shows hypokalemia and evidence of prerenal azotemia with acute kidney injury. She is  given IV fluids, IV potassium, and IV ondansetron. She will need hospitalization for ongoing hydration. I will note, thrombocytosis is present, but is chronic. Review of old records shows prior hospitalization for weakness and hypokalemia.  Case is discussed with Dr. Aggie Moats, of [facility] who agrees to admit the patient.  Final Clinical Impressions(s) / ED Diagnoses   Final diagnoses:  Acute kidney injury (nontraumatic) ([facility])  Intractable vomiting with nausea, unspecified vomiting type  Abdominal pain, unspecified abdominal location  Hypokalemia  Thrombocytosis ([place])    New Prescriptions New Prescriptions   No medications on file     Delora Fuel, MD 70/35/00 [phone]

## 2017-05-01 NOTE — Progress Notes (Addendum)
PROGRESS NOTE  Tiffany Stewart IRW:[id number] DOB: [birth year] DOA: 05/01/2017 PCP: Chevis Pretty, FNP  HPI/Recap of past 58 hours: 46 year old female with past history of spastic hereditary paraplegia, anxiety disorder and tobacco abuse presented to the emergency room on the early morning of 9/18 with complaints of abdominal pain with nausea and vomiting 1 week. In the emergency room, found be dehydrated with acute kidney injury. Abdominal x-ray was suggestive of small bowel obstruction and patient was brought in to the hospital for further evaluation. A CT scan was then done which showed a high-grade small bowel obstruction. Patient made nothing by mouth and hydrated. General surgery consulted.  Following admission, patient unable to void, found to have acute urinary retention. She continues clinical abdominal pain along with nausea. She declined an NG tube. She also had 1-2 episodes of liquid stool which she states things going on for a few days. She also complains of a cough with occasional productive sputum which is whitish.   Assessment/Plan: Principal Problem:   SBO (small bowel obstruction) ([place]): High-grade obstruction. Hydrating, bowel rest. This is patient's first episode. She tells me that she had her gallbladder taken out 25 years ago.general surgery to evaluate. Follow-up abdominal x-ray in the morning. Patient declined NG tube at this time. Active Problems:   TOBACCO ABUSE: Nicotine patch   HSP (hereditary spastic paraplegia) ([facility])   Hypokalemia: Electrolyte abnormality secondary to nausea and vomiting. Replacing., Hydrating      Protein-calorie malnutrition, severe: Nutrition consult ordered. For now, she is nothing by mouth given small bowel obstruction    Hyponatremia: Secondary dehydration. Hydrating.    AKI (acute kidney injury) ([place]): Secondary dehydration with nausea and vomiting. Rehydrating.    Diarrhea: Unclear etiology. Patient has not been on antibiotics  recently and no fever or elevated white blood cell count.  CT scan negative for colitis or do nothing that she has C. difficile. Continue to monitor.  Cough: Given chest x-ray atelectasis and CT scan of abdomen and pelvis findings lung bases clear, suspect that this is more atelectasis. Have ordered incentive spirometry  Urinary retention: Placed Foley catheter   Code Status: Full code   Family Communication: left message with niece   Disposition Plan: anticipated she will be here for several days until bowel obstruction resolves    Consultants:  General surgery   Procedures:  None   Antimicrobials:  None   DVT prophylaxis: Heparin subcutaneous   Objective: Vitals:   05/01/17 0230 05/01/17 0300 05/01/17 0500 05/01/17 0537  BP: (!) 142/93 123/83 (!) 155/111 (!) 156/102  Pulse: 65 67  79  Resp:   17 18  Temp:    98.6 F (37 C)  TempSrc:    Oral  SpO2: 98% 96%  98%  Weight:      Height:        Intake/Output Summary (Last 24 hours) at 05/01/17 1132 Last data filed at 05/01/17 0700  Gross per 24 hour  Intake          1366.67 ml  Output              200 ml  Net          1166.67 ml   Filed Weights   04/30/17 2132  Weight: 49.9 kg (110 lb)    Exam:   General:  Alert and oriented 3, moderate distress secondary to nausea and pain  HEENT: Normocephalic and atraumatic, mucous members are dry  Neck: Supple, no JVD   Cardiovascular: regular  rate and rhythm, S1 and S2   Respiratory: clear to auscultation bilaterally, Breathing rapidly, but not labored  Abdomen: Soft, mildly distended, nonspecific generalized tenderness, few bowel sounds   Musculoskeletal: No clubbing or cyanosis or edema  Skin: no skin breaks, tears or lesions    Psychiatry:  patient is appropriate, no evidence of psychoses  Neuro: No focal deficits    Data Reviewed: CBC:  Recent Labs Lab 04/30/17 2142 05/01/17 0456  WBC 9.0 8.4  HGB 14.6 13.4  HCT 42.8 39.6  MCV 99.8 100.0    PLT 611* 376*   Basic Metabolic Panel:  Recent Labs Lab 04/30/17 2142 05/01/17 0456  NA 134* 136  K 3.1* 3.4*  CL 93* 100*  CO2 24 24  GLUCOSE 157* 118*  BUN 49* 47*  CREATININE 1.76* 1.31*  CALCIUM 9.6 8.4*  MG 2.3  --   PHOS 3.7  --    GFR: Estimated Creatinine Clearance: 41.4 mL/min (A) (by C-G formula based on SCr of 1.31 mg/dL (H)). Liver Function Tests:  Recent Labs Lab 04/30/17 2142  AST 17  ALT 17  ALKPHOS 93  BILITOT 0.1*  PROT 9.4*  ALBUMIN 4.5    Recent Labs Lab 04/30/17 2142  LIPASE 27   No results for input(s): AMMONIA in the last 168 hours. Coagulation Profile: No results for input(s): INR, PROTIME in the last 168 hours. Cardiac Enzymes: No results for input(s): CKTOTAL, CKMB, CKMBINDEX, TROPONINI in the last 168 hours. BNP (last 3 results) No results for input(s): PROBNP in the last 8760 hours. HbA1C: No results for input(s): HGBA1C in the last 72 hours. CBG: No results for input(s): GLUCAP in the last 168 hours. Lipid Profile: No results for input(s): CHOL, HDL, LDLCALC, TRIG, CHOLHDL, LDLDIRECT in the last 72 hours. Thyroid Function Tests: No results for input(s): TSH, T4TOTAL, FREET4, T3FREE, THYROIDAB in the last 72 hours. Anemia Panel: No results for input(s): VITAMINB12, FOLATE, FERRITIN, TIBC, IRON, RETICCTPCT in the last 72 hours. Urine analysis:    Component Value Date/Time   COLORURINE YELLOW 05/01/2017 0150   APPEARANCEUR HAZY (A) 05/01/2017 0150   APPEARANCEUR Clear 03/07/2016 1046   LABSPEC 1.034 (H) 05/01/2017 0150   PHURINE 5.0 05/01/2017 0150   GLUCOSEU NEGATIVE 05/01/2017 0150   HGBUR NEGATIVE 05/01/2017 0150   BILIRUBINUR SMALL (A) 05/01/2017 0150   BILIRUBINUR Negative 03/07/2016 1046   KETONESUR NEGATIVE 05/01/2017 0150   PROTEINUR 30 (A) 05/01/2017 0150   UROBILINOGEN 0.2 03/12/2010 2120   NITRITE NEGATIVE 05/01/2017 0150   LEUKOCYTESUR NEGATIVE 05/01/2017 0150   LEUKOCYTESUR Negative 03/07/2016 1046    Sepsis Labs: @LABRCNTIP (procalcitonin:4,lacticidven:4)  )No results found for this or any previous visit (from the past 240 hour(s)).    Studies: Ct Abdomen Pelvis Wo Contrast  Result Date: 05/01/2017 CLINICAL DATA:  Nausea, vomiting, abdominal pain. Bowel obstruction. EXAM: CT ABDOMEN AND PELVIS WITHOUT CONTRAST TECHNIQUE: Multidetector CT imaging of the abdomen and pelvis was performed following the standard protocol without IV contrast. COMPARISON:  Plain films 05/01/2017 FINDINGS: Lower chest: Lung bases are clear. No effusions. Heart is normal size. Hepatobiliary: Prior cholecystectomy.  No focal hepatic abnormality. Pancreas: No focal abnormality or ductal dilatation. Spleen: No focal abnormality.  Normal size. Adrenals/Urinary Tract: Punctate nonobstructing stone in the upper pole of the left kidney. No hydronephrosis. Adrenal glands and urinary bladder unremarkable. Stomach/Bowel: Dilated fluid-filled small bowel loops are noted with air-fluid levels compatible with high-grade small bowel obstruction. Distal small bowel loops are decompressed. Exact transition is not visualized. Colon  is decompressed, grossly unremarkable. Vascular/Lymphatic: No evidence of aneurysm or adenopathy. Reproductive: Uterus and adnexa unremarkable.  No mass. Other: No free fluid or free air. Musculoskeletal: No acute bony abnormality. IMPRESSION: Dilated small bowel loops with air-fluid levels compatible with high-grade small bowel obstruction. Distal small bowel is decompressed. Exact transition point and cause of the obstruction not visualized. Electronically Signed   By: Rolm Baptise M.D.   On: 05/01/2017 08:29   Dg Chest Port 1 View  Result Date: 05/01/2017 CLINICAL DATA:  Vomiting for about a week. Generalized abdominal pain today. Chest pain, shortness of breath, and cough. Current smoker. EXAM: PORTABLE CHEST 1 VIEW COMPARISON:  10/30/2016 FINDINGS: Shallow inspiration with probable linear atelectasis in  the left lung base. Normal heart size and pulmonary vascularity. No focal airspace disease or consolidation in the lungs. No blunting of costophrenic angles. No pneumothorax. Mediastinal contours appear intact. IMPRESSION: Shallow inspiration with linear atelectasis in the left lung base. Electronically Signed   By: Lucienne Capers M.D.   On: 05/01/2017 04:02   Dg Abd Portable 2v  Result Date: 05/01/2017 CLINICAL DATA:  Vomiting and abdominal pain. EXAM: PORTABLE ABDOMEN - 2 VIEW COMPARISON:  CT abdomen and pelvis 11/13/2016.  Abdomen 03/12/2010. FINDINGS: Gas distended upper and mid abdominal small bowel likely representing small bowel obstruction. Colon appears decompressed. No free air in the abdomen. No radiopaque stones. Surgical clips in the right upper quadrant. Visualized bones appear intact. IMPRESSION: Gas-filled distended small bowel likely representing small bowel obstruction. Electronically Signed   By: Lucienne Capers M.D.   On: 05/01/2017 04:03    Scheduled Meds: . heparin  5,000 Units Subcutaneous Q8H  . nicotine  21 mg Transdermal Daily  . pantoprazole (PROTONIX) IV  40 mg Intravenous Q24H  . sodium chloride flush  3 mL Intravenous Q12H    Continuous Infusions: . sodium chloride 100 mL/hr at 05/01/17 0700  . potassium chloride 10 mEq (05/01/17 1125)     LOS: 0 days     Annita Brod, MD [facility] Pager [phone]  If 7PM-7AM, please contact night-coverage [email or web address] Password [facility] 05/01/2017, 11:32 AM

## 2017-05-01 NOTE — Progress Notes (Signed)
General Surgery  Full Consult to Follow.  SBO on CT scan. No prior SBO. Having flatus yesterday and BM yesterday per her report. No further vomiting since admission but remains nauseated. Refused NG multiple times. Discussed need for bowel rest and NG.   NPO Correct lytes K 4, Phos 3, Mg 2 NG if allows  Will follow.   Curlene Labrum MD  Will update Dr. Maryland Pink.

## 2017-05-02 ENCOUNTER — Inpatient Hospital Stay (HOSPITAL_COMMUNITY): Payer: Medicare Other

## 2017-05-02 DIAGNOSIS — E871 Hypo-osmolality and hyponatremia: Secondary | ICD-10-CM

## 2017-05-02 DIAGNOSIS — G114 Hereditary spastic paraplegia: Secondary | ICD-10-CM

## 2017-05-02 DIAGNOSIS — E43 Unspecified severe protein-calorie malnutrition: Secondary | ICD-10-CM

## 2017-05-02 DIAGNOSIS — R627 Adult failure to thrive: Secondary | ICD-10-CM

## 2017-05-02 DIAGNOSIS — F17200 Nicotine dependence, unspecified, uncomplicated: Secondary | ICD-10-CM

## 2017-05-02 DIAGNOSIS — E876 Hypokalemia: Secondary | ICD-10-CM

## 2017-05-02 DIAGNOSIS — N179 Acute kidney failure, unspecified: Secondary | ICD-10-CM

## 2017-05-02 DIAGNOSIS — D473 Essential (hemorrhagic) thrombocythemia: Secondary | ICD-10-CM

## 2017-05-02 DIAGNOSIS — R05 Cough: Secondary | ICD-10-CM

## 2017-05-02 LAB — CBC
HCT: 35.5 % — ABNORMAL LOW (ref 36.0–46.0)
HEMOGLOBIN: 11.7 g/dL — AB (ref 12.0–15.0)
MCH: 33.5 pg (ref 26.0–34.0)
MCHC: 33 g/dL (ref 30.0–36.0)
MCV: 101.7 fL — ABNORMAL HIGH (ref 78.0–100.0)
PLATELETS: 524 10*3/uL — AB (ref 150–400)
RBC: 3.49 MIL/uL — ABNORMAL LOW (ref 3.87–5.11)
RDW: 13.6 % (ref 11.5–15.5)
WBC: 11.9 10*3/uL — ABNORMAL HIGH (ref 4.0–10.5)

## 2017-05-02 LAB — BASIC METABOLIC PANEL
ANION GAP: 7 (ref 5–15)
Anion gap: 15 (ref 5–15)
BUN: 26 mg/dL — ABNORMAL HIGH (ref 6–20)
BUN: 12 mg/dL (ref 6–20)
CALCIUM: 8 mg/dL — AB (ref 8.9–10.3)
CHLORIDE: 103 mmol/L (ref 101–111)
CO2: 19 mmol/L — ABNORMAL LOW (ref 22–32)
CO2: 21 mmol/L — ABNORMAL LOW (ref 22–32)
CREATININE: 0.7 mg/dL (ref 0.44–1.00)
Calcium: 7.9 mg/dL — ABNORMAL LOW (ref 8.9–10.3)
Chloride: 103 mmol/L (ref 101–111)
Creatinine, Ser: 0.53 mg/dL (ref 0.44–1.00)
GFR calc non Af Amer: >60 mL/min (ref >60)
Glucose, Bld: 53 mg/dL — ABNORMAL LOW (ref 65–99)
Glucose, Bld: 80 mg/dL (ref 65–99)
POTASSIUM: 4.5 mmol/L (ref 3.5–5.1)
Potassium: 2.8 mmol/L — ABNORMAL LOW (ref 3.5–5.1)
SODIUM: 137 mmol/L (ref 135–145)
SODIUM: 131 mmol/L — AB (ref 135–145)

## 2017-05-02 LAB — MAGNESIUM: MAGNESIUM: 1.9 mg/dL (ref 1.7–2.4)

## 2017-05-02 MED ORDER — POTASSIUM CHLORIDE 10 MEQ/100ML IV SOLN
10.0000 meq | INTRAVENOUS | Status: DC
  Administered 2017-05-02 (×2): 10 meq via INTRAVENOUS
  Filled 2017-05-02 (×3): qty 100

## 2017-05-02 MED ORDER — POTASSIUM CHLORIDE 20 MEQ/15ML (10%) PO SOLN
40.0000 meq | Freq: Once | ORAL | Status: AC
  Administered 2017-05-02: 40 meq via ORAL
  Filled 2017-05-02: qty 30

## 2017-05-02 MED ORDER — BISACODYL 10 MG RE SUPP
10.0000 mg | Freq: Once | RECTAL | Status: AC
  Administered 2017-05-02: 10 mg via RECTAL
  Filled 2017-05-02: qty 1

## 2017-05-02 MED ORDER — POTASSIUM CHLORIDE IN NACL 40-0.9 MEQ/L-% IV SOLN
INTRAVENOUS | Status: DC
  Administered 2017-05-02 – 2017-05-03 (×3): 100 mL/h via INTRAVENOUS

## 2017-05-02 MED ORDER — SODIUM CHLORIDE 0.9 % IV SOLN: INTRAVENOUS | Status: DC

## 2017-05-02 MED ORDER — POTASSIUM CHLORIDE 10 MEQ/100ML IV SOLN: 10.0000 meq | INTRAVENOUS | Status: DC

## 2017-05-02 MED ORDER — POTASSIUM CHLORIDE 10 MEQ/100ML IV SOLN
10.0000 meq | INTRAVENOUS | Status: AC
  Administered 2017-05-02 (×4): 10 meq via INTRAVENOUS
  Filled 2017-05-02 (×3): qty 100

## 2017-05-02 NOTE — Progress Notes (Signed)
PROGRESS NOTE    Tiffany Stewart   WIO:[id number]  DOB: 13-Jan-1971  DOA: 05/01/2017 PCP: Chevis Pretty, FNP   Brief Narrative:  Tiffany Stewart spastic hereditary paraplegia, anxiety disorder and tobacco abuse presented to the emergency room on the early morning of 9/18 with complaints of abdominal pain with nausea and vomiting 1 week. Abdominal x-ray was suggestive of small bowel obstruction. A CT scan was done which showed a high-grade small bowel obstruction.  Subjective: No abdominal pain. Passing gas but not BM. No nausea.   No other complaints.   Assessment & Plan:   Principal Problem:   SBO (small bowel obstruction)  - NPO, IVF - Xray today shows more of an ileus- passing gas - correct electrolytes- advanced to clears and dulcolax suppository ordered by gen surgery  Active Problems:   Hypokalemia - cont to replace aggressively     Hyponatremia/ AKI - improved with IVF   Cough/ TOBACCO ABUSE - cont IS    HSP (hereditary spastic paraplegia)  - wheelchair bound  Underweight/ severe malnutrition Body mass index is 19.54 kg/m. -  supplements once able to eat  Chronic thrombocytosis   DVT prophylaxis: Heparin Code Status: DNR Family Communication:  Disposition Plan: home when stable Consultants:   gen surgery Procedures:    Antimicrobials:  Anti-infectives    None       Objective: Vitals:   05/01/17 2055 05/02/17 0500 05/02/17 0552 05/02/17 1300  BP:   117/69 124/78  Pulse:   90 78  Resp:   20 20  Temp:   98.7 F (37.1 C) 98.2 F (36.8 C)  TempSrc:   Oral Oral  SpO2: 94%  96% 99%  Weight:  46.9 kg (103 lb 6.3 oz)    Height:        Intake/Output Summary (Last 24 hours) at 05/02/17 1811 Last data filed at 05/02/17 1726  Gross per 24 hour  Intake              720 ml  Output             2350 ml  Net            -1630 ml   Filed Weights   04/30/17 2132 05/01/17 1639 05/02/17 0500  Weight: 49.9 kg (110 lb) 47 kg (103 lb 9.9 oz) 46.9  kg (103 lb 6.3 oz)    Examination: General exam: Appears comfortable  HEENT: PERRLA, oral mucosa moist, no sclera icterus or thrush Respiratory system: Clear to auscultation. Respiratory effort normal. Cardiovascular system: S1 & S2 heard, RRR.  No murmurs  Gastrointestinal system: Abdomen soft, non-tender, mildly distended. Normal bowel sound. No organomegaly Central nervous system: Alert and oriented. No focal neurological deficits. Extremities: No cyanosis, clubbing or edema Skin: No rashes or ulcers Psychiatry:  Mood & affect appropriate.     Data Reviewed: I have personally reviewed following labs and imaging studies  CBC:  Recent Labs Lab 04/30/17 2142 05/01/17 0456 05/02/17 0606  WBC 9.0 8.4 11.9*  HGB 14.6 13.4 11.7*  HCT 42.8 39.6 35.5*  MCV 99.8 100.0 101.7*  PLT 611* 470* 426*   Basic Metabolic Panel:  Recent Labs Lab 04/30/17 2142 05/01/17 0456 05/02/17 0606 05/02/17 0830  NA 134* 136 137  --   K 3.1* 3.4* 2.8*  --   CL 93* 100* 103  --   CO2 24 24 19*  --   GLUCOSE 157* 118* 53*  --   BUN 49* 47* 26*  --  CREATININE 1.76* 1.31* 0.70  --   CALCIUM 9.6 8.4* 8.0*  --   MG 2.3  --   --  1.9  PHOS 3.7  --   --   --    GFR: Estimated Creatinine Clearance: 66.4 mL/min (by C-G formula based on SCr of 0.7 mg/dL). Liver Function Tests:  Recent Labs Lab 04/30/17 2142  AST 17  ALT 17  ALKPHOS 93  BILITOT 0.1*  PROT 9.4*  ALBUMIN 4.5    Recent Labs Lab 04/30/17 2142  LIPASE 27   No results for input(s): AMMONIA in the last 168 hours. Coagulation Profile: No results for input(s): INR, PROTIME in the last 168 hours. Cardiac Enzymes: No results for input(s): CKTOTAL, CKMB, CKMBINDEX, TROPONINI in the last 168 hours. BNP (last 3 results) No results for input(s): PROBNP in the last 8760 hours. HbA1C: No results for input(s): HGBA1C in the last 72 hours. CBG: No results for input(s): GLUCAP in the last 168 hours. Lipid Profile: No results  for input(s): CHOL, HDL, LDLCALC, TRIG, CHOLHDL, LDLDIRECT in the last 72 hours. Thyroid Function Tests: No results for input(s): TSH, T4TOTAL, FREET4, T3FREE, THYROIDAB in the last 72 hours. Anemia Panel: No results for input(s): VITAMINB12, FOLATE, FERRITIN, TIBC, IRON, RETICCTPCT in the last 72 hours. Urine analysis:    Component Value Date/Time   COLORURINE YELLOW 05/01/2017 0150   APPEARANCEUR HAZY (A) 05/01/2017 0150   APPEARANCEUR Clear 03/07/2016 1046   LABSPEC 1.034 (H) 05/01/2017 0150   PHURINE 5.0 05/01/2017 0150   GLUCOSEU NEGATIVE 05/01/2017 0150   HGBUR NEGATIVE 05/01/2017 0150   BILIRUBINUR SMALL (A) 05/01/2017 0150   BILIRUBINUR Negative 03/07/2016 1046   KETONESUR NEGATIVE 05/01/2017 0150   PROTEINUR 30 (A) 05/01/2017 0150   UROBILINOGEN 0.2 03/12/2010 2120   NITRITE NEGATIVE 05/01/2017 0150   LEUKOCYTESUR NEGATIVE 05/01/2017 0150   LEUKOCYTESUR Negative 03/07/2016 1046   Sepsis Labs: @LABRCNTIP (procalcitonin:4,lacticidven:4) )No results found for this or any previous visit (from the past 240 hour(s)).       Radiology Studies: Ct Abdomen Pelvis Wo Contrast  Result Date: 05/01/2017 CLINICAL DATA:  Nausea, vomiting, abdominal pain. Bowel obstruction. EXAM: CT ABDOMEN AND PELVIS WITHOUT CONTRAST TECHNIQUE: Multidetector CT imaging of the abdomen and pelvis was performed following the standard protocol without IV contrast. COMPARISON:  Plain films 05/01/2017 FINDINGS: Lower chest: Lung bases are clear. No effusions. Heart is normal size. Hepatobiliary: Prior cholecystectomy.  No focal hepatic abnormality. Pancreas: No focal abnormality or ductal dilatation. Spleen: No focal abnormality.  Normal size. Adrenals/Urinary Tract: Punctate nonobstructing stone in the upper pole of the left kidney. No hydronephrosis. Adrenal glands and urinary bladder unremarkable. Stomach/Bowel: Dilated fluid-filled small bowel loops are noted with air-fluid levels compatible with high-grade  small bowel obstruction. Distal small bowel loops are decompressed. Exact transition is not visualized. Colon is decompressed, grossly unremarkable. Vascular/Lymphatic: No evidence of aneurysm or adenopathy. Reproductive: Uterus and adnexa unremarkable.  No mass. Other: No free fluid or free air. Musculoskeletal: No acute bony abnormality. IMPRESSION: Dilated small bowel loops with air-fluid levels compatible with high-grade small bowel obstruction. Distal small bowel is decompressed. Exact transition point and cause of the obstruction not visualized. Electronically Signed   By: Rolm Baptise M.D.   On: 05/01/2017 08:29   Dg Chest Port 1 View  Result Date: 05/01/2017 CLINICAL DATA:  Vomiting for about a week. Generalized abdominal pain today. Chest pain, shortness of breath, and cough. Current smoker. EXAM: PORTABLE CHEST 1 VIEW COMPARISON:  10/30/2016 FINDINGS: Shallow inspiration  with probable linear atelectasis in the left lung base. Normal heart size and pulmonary vascularity. No focal airspace disease or consolidation in the lungs. No blunting of costophrenic angles. No pneumothorax. Mediastinal contours appear intact. IMPRESSION: Shallow inspiration with linear atelectasis in the left lung base. Electronically Signed   By: Lucienne Capers M.D.   On: 05/01/2017 04:02   Dg Abd 2 Views  Result Date: 05/02/2017 CLINICAL DATA:  Small bowel obstruction. EXAM: ABDOMEN - 2 VIEW COMPARISON:  Abdominal CT from yesterday FINDINGS: There is diffuse gaseous distension of small and large bowel. Small bowel distention may have improved since yesterday. There is greater gas seen within the right colon. No concerning mass effect. No evidence of pneumoperitoneum. The lung bases are clear. IMPRESSION: Improved bowel gas pattern with increased colonic gas and decreased small bowel distention. Today the bowel appears diffusely distended, which could reflect an ileus or air swallowing. Electronically Signed   By: Monte Fantasia M.D.   On: 05/02/2017 09:35   Dg Abd Portable 2v  Result Date: 05/01/2017 CLINICAL DATA:  Vomiting and abdominal pain. EXAM: PORTABLE ABDOMEN - 2 VIEW COMPARISON:  CT abdomen and pelvis 11/13/2016.  Abdomen 03/12/2010. FINDINGS: Gas distended upper and mid abdominal small bowel likely representing small bowel obstruction. Colon appears decompressed. No free air in the abdomen. No radiopaque stones. Surgical clips in the right upper quadrant. Visualized bones appear intact. IMPRESSION: Gas-filled distended small bowel likely representing small bowel obstruction. Electronically Signed   By: Lucienne Capers M.D.   On: 05/01/2017 04:03      Scheduled Meds: . heparin  5,000 Units Subcutaneous Q8H  . nicotine  21 mg Transdermal Daily  . pantoprazole (PROTONIX) IV  40 mg Intravenous Q24H  . sodium chloride flush  3 mL Intravenous Q12H   Continuous Infusions: . 0.9 % NaCl with KCl 40 mEq / L 100 mL/hr (05/02/17 0920)     LOS: 1 day    Time spent in minutes: 35    Debbe Odea, MD [facility] Pager: [email or web address] Password [facility] 05/02/2017, 6:11 PM

## 2017-05-02 NOTE — Progress Notes (Addendum)
  Subjective: Feeling less bloated. No nausea or vomiting and having flatus. No BMs.  Reports being very hungry and stomach making sounds.   Objective: Vital signs in last 24 hours: Temp:  [97.7 F (36.5 C)-98.7 F (37.1 C)] 98.7 F (37.1 C) (09/19 0552) Pulse Rate:  [87-97] 90 (09/19 0552) Resp:  [18-20] 20 (09/19 0552) BP: (117-152)/(69-95) 117/69 (09/19 0552) SpO2:  [94 %-97 %] 96 % (09/19 0552) Weight:  [103 lb 6.3 oz (46.9 kg)-103 lb 9.9 oz (47 kg)] 103 lb 6.3 oz (46.9 kg) (09/19 0500) Last BM Date: 05/01/17  Intake/Output from previous day: 09/18 0701 - 09/19 0700 In: 800 [I.V.:800] Out: 500 [Urine:500] Intake/Output this shift: No intake/output data recorded.  General appearance: alert, cooperative and no distress GI: soft, distended, minimally tender, no rebound or guarding  Lab Results:   Recent Labs  05/01/17 0456 05/02/17 0606  WBC 8.4 11.9*  HGB 13.4 11.7*  HCT 39.6 35.5*  PLT 470* 524*   BMET  Recent Labs  05/01/17 0456 05/02/17 0606  NA 136 137  K 3.4* 2.8*  CL 100* 103  CO2 24 19*  GLUCOSE 118* 53*  BUN 47* 26*  CREATININE 1.31* 0.70  CALCIUM 8.4* 8.0*   PT/INR No results for input(s): LABPROT, INR in the last 72 hours.  Studies/Results: Xray reviewed personally- dilated colon and small bowel. Stool burden in vault.   CLINICAL DATA:  Small bowel obstruction.  EXAM: ABDOMEN - 2 VIEW  COMPARISON:  Abdominal CT from yesterday  FINDINGS: There is diffuse gaseous distension of small and large bowel. Small bowel distention may have improved since yesterday. There is greater gas seen within the right colon. No concerning mass effect. No evidence of pneumoperitoneum. The lung bases are clear.  IMPRESSION: Improved bowel gas pattern with increased colonic gas and decreased small bowel distention. Today the bowel appears diffusely distended, which could reflect an ileus or air swallowing.  Assessment/Plan: Patient having flatus and  feeling better. Refused NG tube. Have told her she can try clears but to go slowly. Discussed with patient and RN.    If has pain, worsening distention or nausea/vomiting will need to be NPO again.   Can also give suppository to help stimulate the stool in rectal vault. Ordered.   Continue to correct lytes with a goal of K 4, Phos 3, Mg 2.  [name] 05/02/2017

## 2017-05-03 ENCOUNTER — Other Ambulatory Visit: Payer: Self-pay | Admitting: Nurse Practitioner

## 2017-05-03 DIAGNOSIS — G8220 Paraplegia, unspecified: Secondary | ICD-10-CM

## 2017-05-03 LAB — BASIC METABOLIC PANEL
Anion gap: 10 (ref 5–15)
BUN: 6 mg/dL (ref 6–20)
CO2: 23 mmol/L (ref 22–32)
CREATININE: 0.51 mg/dL (ref 0.44–1.00)
Calcium: 8.3 mg/dL — ABNORMAL LOW (ref 8.9–10.3)
Chloride: 101 mmol/L (ref 101–111)
GFR calc non Af Amer: >60 mL/min (ref >60)
Glucose, Bld: 85 mg/dL (ref 65–99)
Potassium: 4.8 mmol/L (ref 3.5–5.1)
SODIUM: 134 mmol/L — AB (ref 135–145)

## 2017-05-03 MED ORDER — ENSURE ENLIVE PO LIQD: 237.0000 mL | Freq: Two times a day (BID) | ORAL | 12 refills | Status: DC

## 2017-05-03 NOTE — Progress Notes (Signed)
[facility]     Subjective: Having BMs and flatus, Tolerated 720 clears. Still with some abdominal pain and spitting but no vomiting.   Objective: Vital signs in last 24 hours: Temp:  [98.2 F (36.8 C)-98.7 F (37.1 C)] 98.2 F (36.8 C) (09/20 0601) Pulse Rate:  [74-78] 77 (09/20 0601) Resp:  [18-20] 18 (09/20 0601) BP: (124-133)/(78-90) 133/90 (09/20 0601) SpO2:  [98 %-99 %] 98 % (09/20 0601) Weight:  [105 lb 6.1 oz (47.8 kg)] 105 lb 6.1 oz (47.8 kg) (09/20 0500) Last BM Date: 05/03/17  Intake/Output from previous day: 09/19 0701 - 09/20 0700 In: 2758.3 [P.O.:720; I.V.:2038.3] Out: 2150 [Urine:2150] Intake/Output this shift: Total I/O In: 360 [P.O.:360] Out: -   General appearance: alert and cooperative GI: soft, slightly distended, minimally tender  Lab Results:   Recent Labs  05/01/17 0456 05/02/17 0606  WBC 8.4 11.9*  HGB 13.4 11.7*  HCT 39.6 35.5*  PLT 470* 524*   BMET  Recent Labs  05/02/17 2003 05/03/17 0827  NA 131* 134*  K 4.5 4.8  CL 103 101  CO2 21* 23  GLUCOSE 80 85  BUN 12 6  CREATININE 0.53 0.51  CALCIUM 7.9* 8.3*   PT/INR No results for input(s): LABPROT, INR in the last 72 hours.  Studies/Results: Dg Abd 2 Views  Result Date: 05/02/2017 CLINICAL DATA:  Small bowel obstruction. EXAM: ABDOMEN - 2 VIEW COMPARISON:  Abdominal CT from yesterday FINDINGS: There is diffuse gaseous distension of small and large bowel. Small bowel distention may have improved since yesterday. There is greater gas seen within the right colon. No concerning mass effect. No evidence of pneumoperitoneum. The lung bases are clear. IMPRESSION: Improved bowel gas pattern with increased colonic gas and decreased small bowel distention. Today the bowel appears diffusely distended, which could reflect an ileus or air swallowing. Electronically Signed   By: Monte Fantasia M.D.   On: 05/02/2017 09:35    Assessment/Plan: Patient with flatus and BM.  Tolerated clears. Recommend soft diet as tolerated. Updated Dr. Wynelle Cleveland.    LOS: 2 days    [name] 05/03/2017

## 2017-05-03 NOTE — Telephone Encounter (Signed)
Can not do pain meds without being seen- will have to call sugeon

## 2017-05-03 NOTE — Care Management Important Message (Signed)
Important Message  Patient Details  Name: Tiffany Stewart MRN: [id number] Date of Birth: 1971-05-20   Medicare Important Message Given:  Yes    Sherald Barge, RN 05/03/2017, 3:40 PM

## 2017-05-03 NOTE — Care Management Note (Signed)
Case Management Note  Patient Details  Name: Tiffany Stewart MRN: [id number] Date of Birth: 04/25/1971  Subjective/Objective:                  Admitted with SBO. Pt from home, lives with family, niece is primary caregiver. Pt has hereditary spastic paraplegia, she is bed-bound. She has PCP, transportation and insurance with drug coverage. She plans to return home. No HH or DME needs pta.   Action/Plan: Plan for DC home with resumption of previous care giving arrangements. CM will cont to follow.   Expected Discharge Date:     05/04/2017             Expected Discharge Plan:  Home/Self Care  In-House Referral:  NA  Discharge planning Services  CM Consult  Post Acute Care Choice:  NA Choice offered to:  NA  Status of Service:  Completed, signed off  Sherald Barge, RN 05/03/2017, 1:59 PM

## 2017-05-03 NOTE — Discharge Summary (Signed)
Physician Discharge Summary  Tiffany Stewart ION:[id number] DOB: [birth year] DOA: 05/01/2017  PCP: Tiffany Pretty, FNP  Admit date: 05/01/2017 Discharge date: 05/03/2017  Admitted From: home  Disposition:  home   Recommendations for Outpatient Follow-up:  1. F/u on oral intake, dehydration and tobacco abuse- have added supplements to diet  Discharge Condition:  stable   CODE STATUS:  DNR   Consultations:  gen surgery    Discharge Diagnoses:  Principal Problem:   SBO (small bowel obstruction) ([place]) Active Problems:   Hypokalemia   Hyponatremia   Acute kidney injury (nontraumatic) (HCC)   TOBACCO ABUSE   Paraplegia (HCC)   HSP (hereditary spastic paraplegia) (HCC)   Protein-calorie malnutrition, severe    Subjective: Has had at least 3 BMs since yesterday. No nausea or vomiting with clear liquids. No other complaints.   Brief Summary: Tiffany Stewart spastic hereditary paraplegia, anxiety disorder and tobacco abuse presented to the emergency room on the early morning of 9/18 with complaints of abdominal pain with nausea and vomiting 1 week. Abdominal x-ray was suggestive of small bowel obstruction. A CT scan was done which showed a high-grade small bowel obstruction.  Hospital Course:  Principal Problem:   SBO (small bowel obstruction)  - NPO, IVF - 9/18 > Xray today shows more of an ileus- passing gas- correct electrolytes- advanced to clears - dulcolax suppository ordered by gen surgery - 9/20- tolerating clears, had 3 BMs- advanced to solid food which she has tolerated well- stable for d/c   Active Problems:   Hypokalemia -  replaced aggressively- see labs below     Hyponatremia/ AKI- dehydration - improved with IVF- see labs below  Urinary retention - inability to void was likely related to above GI issues. Foley has been removed today and she has voided normally-    Cough/ TOBACCO ABUSE - advised to quit smoking   HSP (hereditary spastic paraplegia)  -  wheelchair bound  Underweight/ severe malnutrition Body mass index is 19.54 kg/m. -  supplements ordered    Chronic thrombocytosis   Discharge Instructions  Discharge Instructions    Diet - low sodium heart healthy    Complete by:  As directed    Soft easy to digest diet.   Increase activity slowly    Complete by:  As directed      Allergies as of 05/03/2017      Reactions   Hctz [hydrochlorothiazide] Diarrhea   Morphine Other (See Comments)   Flushing, turns skin red    Zoloft [sertraline Hcl] Other (See Comments)   Spaced out      Medication List    STOP taking these medications   dicyclomine 20 MG tablet Commonly known as:  BENTYL     TAKE these medications   acetaminophen 500 MG tablet Commonly known as:  TYLENOL Take 1,500 mg by mouth every 6 (six) hours as needed for moderate pain.   ALPRAZolam 0.5 MG tablet Commonly known as:  XANAX TAKE 1 TABLET BY MOUTH TWICE DAILY AS NEEDED FOR ANXIETY.   feeding supplement (ENSURE ENLIVE) Liqd Take 237 mLs by mouth 2 (two) times daily between meals.   pantoprazole 40 MG tablet Commonly known as:  PROTONIX Take 1 tablet (40 mg total) by mouth 2 (two) times daily before a meal. What changed:  when to take this  reasons to take this   tiZANidine 4 MG tablet Commonly known as:  ZANAFLEX Take 1 tablet (4 mg total) by mouth 3 (three) times daily.  Discharge Care Instructions        Start     Ordered   05/03/17 0000  Increase activity slowly     05/03/17 1455   05/03/17 0000  Diet - low sodium heart healthy    Comments:  Soft easy to digest diet.   05/03/17 1455   05/03/17 0000  feeding supplement, ENSURE ENLIVE, (ENSURE ENLIVE) LIQD  2 times daily between meals     05/03/17 1457     Follow-up Information    Hassell Done, Mary-Margaret, FNP Follow up in 1 week(s).   Specialty:  Family Medicine Contact information: [address] [place] [address] [phone]           Allergies  Allergen Reactions  . Hctz [Hydrochlorothiazide] Diarrhea  . Morphine Other (See Comments)    Flushing, turns skin red   . Zoloft [Sertraline Hcl] Other (See Comments)    Spaced out     Procedures/Studies:    Ct Abdomen Pelvis Wo Contrast  Result Date: 05/01/2017 CLINICAL DATA:  Nausea, vomiting, abdominal pain. Bowel obstruction. EXAM: CT ABDOMEN AND PELVIS WITHOUT CONTRAST TECHNIQUE: Multidetector CT imaging of the abdomen and pelvis was performed following the standard protocol without IV contrast. COMPARISON:  Plain films 05/01/2017 FINDINGS: Lower chest: Lung bases are clear. No effusions. Heart is normal size. Hepatobiliary: Prior cholecystectomy.  No focal hepatic abnormality. Pancreas: No focal abnormality or ductal dilatation. Spleen: No focal abnormality.  Normal size. Adrenals/Urinary Tract: Punctate nonobstructing stone in the upper pole of the left kidney. No hydronephrosis. Adrenal glands and urinary bladder unremarkable. Stomach/Bowel: Dilated fluid-filled small bowel loops are noted with air-fluid levels compatible with high-grade small bowel obstruction. Distal small bowel loops are decompressed. Exact transition is not visualized. Colon is decompressed, grossly unremarkable. Vascular/Lymphatic: No evidence of aneurysm or adenopathy. Reproductive: Uterus and adnexa unremarkable.  No mass. Other: No free fluid or free air. Musculoskeletal: No acute bony abnormality. IMPRESSION: Dilated small bowel loops with air-fluid levels compatible with high-grade small bowel obstruction. Distal small bowel is decompressed. Exact transition point and cause of the obstruction not visualized. Electronically Signed   By: Rolm Baptise M.D.   On: 05/01/2017 08:29   Dg Chest Port 1 View  Result Date: 05/01/2017 CLINICAL DATA:  Vomiting for about a week. Generalized abdominal pain today. Chest pain, shortness of breath, and cough. Current smoker. EXAM: PORTABLE CHEST 1 VIEW COMPARISON:   10/30/2016 FINDINGS: Shallow inspiration with probable linear atelectasis in the left lung base. Normal heart size and pulmonary vascularity. No focal airspace disease or consolidation in the lungs. No blunting of costophrenic angles. No pneumothorax. Mediastinal contours appear intact. IMPRESSION: Shallow inspiration with linear atelectasis in the left lung base. Electronically Signed   By: Lucienne Capers M.D.   On: 05/01/2017 04:02   Dg Abd 2 Views  Result Date: 05/02/2017 CLINICAL DATA:  Small bowel obstruction. EXAM: ABDOMEN - 2 VIEW COMPARISON:  Abdominal CT from yesterday FINDINGS: There is diffuse gaseous distension of small and large bowel. Small bowel distention may have improved since yesterday. There is greater gas seen within the right colon. No concerning mass effect. No evidence of pneumoperitoneum. The lung bases are clear. IMPRESSION: Improved bowel gas pattern with increased colonic gas and decreased small bowel distention. Today the bowel appears diffusely distended, which could reflect an ileus or air swallowing. Electronically Signed   By: Monte Fantasia M.D.   On: 05/02/2017 09:35   Dg Abd Portable 2v  Result Date: 05/01/2017 CLINICAL DATA:  Vomiting and abdominal pain. EXAM: PORTABLE ABDOMEN - 2 VIEW COMPARISON:  CT abdomen and pelvis 11/13/2016.  Abdomen 03/12/2010. FINDINGS: Gas distended upper and mid abdominal small bowel likely representing small bowel obstruction. Colon appears decompressed. No free air in the abdomen. No radiopaque stones. Surgical clips in the right upper quadrant. Visualized bones appear intact. IMPRESSION: Gas-filled distended small bowel likely representing small bowel obstruction. Electronically Signed   By: Lucienne Capers M.D.   On: 05/01/2017 04:03       Discharge Exam: Vitals:   05/02/17 2013 05/03/17 0601  BP: 126/84 133/90  Pulse: 74 77  Resp: 20 18  Temp: 98.7 F (37.1 C) 98.2 F (36.8 C)  SpO2: 98% 98%   Vitals:   05/02/17 1300  05/02/17 2013 05/03/17 0500 05/03/17 0601  BP: 124/78 126/84  133/90  Pulse: 78 74  77  Resp: 20 20  18   Temp: 98.2 F (36.8 C) 98.7 F (37.1 C)  98.2 F (36.8 C)  TempSrc: Oral Oral  Oral  SpO2: 99% 98%  98%  Weight:   47.8 kg (105 lb 6.1 oz)   Height:        General: Pt is alert, awake, not in acute distress Cardiovascular: RRR, S1/S2 +, no rubs, no gallops Respiratory: CTA bilaterally, no wheezing, no rhonchi Abdominal: Soft, NT, ND, bowel sounds + Extremities: no edema, no cyanosis    The results of significant diagnostics from this hospitalization (including imaging, microbiology, ancillary and laboratory) are listed below for reference.     Microbiology: No results found for this or any previous visit (from the past 240 hour(s)).   Labs: BNP (last 3 results) No results for input(s): BNP in the last 8760 hours. Basic Metabolic Panel:  Recent Labs Lab 04/30/17 2142 05/01/17 0456 05/02/17 0606 05/02/17 0830 05/02/17 2003 05/03/17 0827  NA 134* 136 137  --  131* 134*  K 3.1* 3.4* 2.8*  --  4.5 4.8  CL 93* 100* 103  --  103 101  CO2 24 24 19*  --  21* 23  GLUCOSE 157* 118* 53*  --  80 85  BUN 49* 47* 26*  --  12 6  CREATININE 1.76* 1.31* 0.70  --  0.53 0.51  CALCIUM 9.6 8.4* 8.0*  --  7.9* 8.3*  MG 2.3  --   --  1.9  --   --   PHOS 3.7  --   --   --   --   --    Liver Function Tests:  Recent Labs Lab 04/30/17 2142  AST 17  ALT 17  ALKPHOS 93  BILITOT 0.1*  PROT 9.4*  ALBUMIN 4.5    Recent Labs Lab 04/30/17 2142  LIPASE 27   No results for input(s): AMMONIA in the last 168 hours. CBC:  Recent Labs Lab 04/30/17 2142 05/01/17 0456 05/02/17 0606  WBC 9.0 8.4 11.9*  HGB 14.6 13.4 11.7*  HCT 42.8 39.6 35.5*  MCV 99.8 100.0 101.7*  PLT 611* 470* 524*   Cardiac Enzymes: No results for input(s): CKTOTAL, CKMB, CKMBINDEX, TROPONINI in the last 168 hours. BNP: Invalid input(s): POCBNP CBG: No results for input(s): GLUCAP in the last 168  hours. D-Dimer No results for input(s): DDIMER in the last 72 hours. Hgb A1c No results for input(s): HGBA1C in the last 72 hours. Lipid Profile No results for input(s): CHOL, HDL, LDLCALC, TRIG, CHOLHDL, LDLDIRECT in the last 72 hours. Thyroid function studies No results for input(s): TSH, T4TOTAL, T3FREE, THYROIDAB  in the last 72 hours.  Invalid input(s): FREET3 Anemia work up No results for input(s): VITAMINB12, FOLATE, FERRITIN, TIBC, IRON, RETICCTPCT in the last 72 hours. Urinalysis    Component Value Date/Time   COLORURINE YELLOW 05/01/2017 0150   APPEARANCEUR HAZY (A) 05/01/2017 0150   APPEARANCEUR Clear 03/07/2016 1046   LABSPEC 1.034 (H) 05/01/2017 0150   PHURINE 5.0 05/01/2017 0150   GLUCOSEU NEGATIVE 05/01/2017 0150   HGBUR NEGATIVE 05/01/2017 0150   BILIRUBINUR SMALL (A) 05/01/2017 0150   BILIRUBINUR Negative 03/07/2016 1046   KETONESUR NEGATIVE 05/01/2017 0150   PROTEINUR 30 (A) 05/01/2017 0150   UROBILINOGEN 0.2 03/12/2010 2120   NITRITE NEGATIVE 05/01/2017 0150   LEUKOCYTESUR NEGATIVE 05/01/2017 0150   LEUKOCYTESUR Negative 03/07/2016 1046   Sepsis Labs Invalid input(s): PROCALCITONIN,  WBC,  LACTICIDVEN Microbiology No results found for this or any previous visit (from the past 240 hour(s)).   Time coordinating discharge: Over 30 minutes  SIGNED:   Debbe Odea, MD  [facility] 05/03/2017, 3:02 PM Pager   If 7PM-7AM, please contact night-coverage [email or web address] Password [email or web address]

## 2017-05-03 NOTE — Telephone Encounter (Signed)
Pt aware.

## 2017-05-03 NOTE — Discharge Instructions (Signed)
Please slowly try to quit smoking. Try to increase body weight- I have ordered dietary supplements.   Please take all your medications with you for your next visit with your Primary MD. Please request your Primary MD to go over all hospital test results at the follow up. Please ask your Primary MD to get all Hospital records sent to his/her office.  If you experience worsening of your admission symptoms, develop shortness of breath, chest pain, suicidal or homicidal thoughts or a life threatening emergency, you must seek medical attention immediately by calling 911 or calling your MD.  Tiffany Stewart must read the complete instructions/literature along with all the possible adverse reactions/side effects for all the medicines you take including new medications that have been prescribed to you. Take new medicines after you have completely understood and accpet all the possible adverse reactions/side effects.   Do not drive when taking pain medications or sedatives.    Do not take more than prescribed Pain, Sleep and Anxiety Medications  If you have smoked or chewed Tobacco in the last 2 yrs please stop. Stop any regular alcohol and or recreational drug use.  Wear Seat belts while driving.

## 2017-05-04 ENCOUNTER — Telehealth: Payer: Self-pay | Admitting: Nurse Practitioner

## 2017-05-05 ENCOUNTER — Other Ambulatory Visit: Payer: Self-pay | Admitting: Nurse Practitioner

## 2017-05-05 MED ORDER — HYOSCYAMINE SULFATE ER 0.375 MG PO TB12: 0.3750 mg | ORAL_TABLET | Freq: Two times a day (BID) | ORAL | 0 refills | Status: DC

## 2017-05-05 NOTE — Telephone Encounter (Signed)
Spoke with niece, no pain meds will be prescribed per MMM

## 2017-05-07 ENCOUNTER — Telehealth: Payer: Self-pay | Admitting: Nurse Practitioner

## 2017-05-07 NOTE — Telephone Encounter (Signed)
Patient aware of that she will need to be seen for refills

## 2017-05-14 ENCOUNTER — Telehealth: Payer: Self-pay | Admitting: Nurse Practitioner

## 2017-05-14 NOTE — Telephone Encounter (Signed)
Patient aware she will need to be seen for any medication refills

## 2017-05-21 ENCOUNTER — Other Ambulatory Visit: Payer: Self-pay | Admitting: Nurse Practitioner

## 2017-05-21 DIAGNOSIS — G114 Hereditary spastic paraplegia: Secondary | ICD-10-CM

## 2017-05-21 NOTE — Telephone Encounter (Signed)
What is the name of the medication? Tizanidine 4 mg  Have you contacted your pharmacy to request a refill? YES  Which pharmacy would you like this sent to? [facility]   Patient notified that their request is being sent to the clinical staff for review and that they should receive a call once it is complete. If they do not receive a call within 24 hours they can check with their pharmacy or our office.

## 2017-05-22 MED ORDER — TIZANIDINE HCL 4 MG PO TABS: 4.0000 mg | ORAL_TABLET | Freq: Three times a day (TID) | ORAL | 0 refills | Status: DC

## 2017-06-04 ENCOUNTER — Ambulatory Visit (INDEPENDENT_AMBULATORY_CARE_PROVIDER_SITE_OTHER): Payer: Medicare Other | Admitting: Nurse Practitioner

## 2017-06-04 ENCOUNTER — Encounter: Payer: Self-pay | Admitting: Nurse Practitioner

## 2017-06-04 VITALS — BP 138/91 | HR 70 | Temp 97.0°F

## 2017-06-04 DIAGNOSIS — K589 Irritable bowel syndrome without diarrhea: Secondary | ICD-10-CM | POA: Diagnosis not present

## 2017-06-04 DIAGNOSIS — K56609 Unspecified intestinal obstruction, unspecified as to partial versus complete obstruction: Secondary | ICD-10-CM | POA: Diagnosis not present

## 2017-06-04 DIAGNOSIS — Z09 Encounter for follow-up examination after completed treatment for conditions other than malignant neoplasm: Secondary | ICD-10-CM

## 2017-06-04 MED ORDER — HYOSCYAMINE SULFATE ER 0.375 MG PO TB12: 0.3750 mg | ORAL_TABLET | Freq: Two times a day (BID) | ORAL | 3 refills | Status: DC

## 2017-06-04 NOTE — Patient Instructions (Signed)
Diet for Irritable Bowel Syndrome When you have irritable bowel syndrome (IBS), the foods you eat and your eating habits are very important. IBS may cause various symptoms, such as abdominal pain, constipation, or diarrhea. Choosing the right foods can help ease discomfort caused by these symptoms. Work with your health care provider and dietitian to find the best eating plan to help control your symptoms. What general guidelines do I need to follow?  Keep a food diary. This will help you identify foods that cause symptoms. Write down: ? What you eat and when. ? What symptoms you have. ? When symptoms occur in relation to your meals.  Avoid foods that cause symptoms. Talk with your dietitian about other ways to get the same nutrients that are in these foods.  Eat more foods that contain fiber. Take a fiber supplement if directed by your dietitian.  Eat your meals slowly, in a relaxed setting.  Aim to eat 5-6 small meals per day. Do not skip meals.  Drink enough fluids to keep your urine clear or pale yellow.  Ask your health care provider if you should take an over-the-counter probiotic during flare-ups to help restore healthy gut bacteria.  If you have cramping or diarrhea, try making your meals low in fat and high in carbohydrates. Examples of carbohydrates are pasta, rice, whole grain breads and cereals, fruits, and vegetables.  If dairy products cause your symptoms to flare up, try eating less of them. You might be able to handle yogurt better than other dairy products because it contains bacteria that help with digestion. What foods are not recommended? The following are some foods and drinks that may worsen your symptoms:  Fatty foods, such as French fries.  Milk products, such as cheese or ice cream.  Chocolate.  Alcohol.  Products with caffeine, such as coffee.  Carbonated drinks, such as soda.  The items listed above may not be a complete list of foods and beverages to  avoid. Contact your dietitian for more information. What foods are good sources of fiber? Your health care provider or dietitian may recommend that you eat more foods that contain fiber. Fiber can help reduce constipation and other IBS symptoms. Add foods with fiber to your diet a little at a time so that your body can get used to them. Too much fiber at once might cause gas and swelling of your abdomen. The following are some foods that are good sources of fiber:  Apples.  Peaches.  Pears.  Berries.  Figs.  Broccoli (raw).  Cabbage.  Carrots.  Raw peas.  Kidney beans.  Lima beans.  Whole grain bread.  Whole grain cereal.  Where to find more information: International Foundation for Functional Gastrointestinal Disorders: www.iffgd.org National Institute of Diabetes and Digestive and Kidney Diseases: www.niddk.nih.gov/health-information/health-topics/digestive-diseases/ibs/Pages/facts.aspx This information is not intended to replace advice given to you by your health care provider. Make sure you discuss any questions you have with your health care provider. Document Released: 10/21/2003 Document Revised: 01/06/2016 Document Reviewed: 10/31/2013 Elsevier Interactive Patient Education  2018 Elsevier Inc.  

## 2017-06-04 NOTE — Progress Notes (Signed)
   Subjective:    Patient ID: Tiffany Stewart, female    DOB: 17-Sep-1970, 46 y.o.   MRN: [id number]  HPI Patient in today for hospital follow up. She was having pain in her stomach. They ran some test and she did not have an ulcer. Was dx with a small bowel obstruction. She did not have to have surgery. They stopped her dicyclomine and put her on hyoscyamine. She says that she never did get hyoscyamine from the pharmacy. She says she has had some stomach pains. She denies diarrhea or constipation. But hospital diagnosed her with IBS.    Review of Systems  Constitutional: Negative.   HENT: Negative.   Respiratory: Negative.   Cardiovascular: Negative.   Gastrointestinal: Positive for abdominal pain (intermittent 2/10). Negative for constipation, diarrhea, nausea and vomiting.  Neurological: Negative.   Psychiatric/Behavioral: Negative.   All other systems reviewed and are negative.      Objective:   Physical Exam  Constitutional: She is oriented to person, place, and time. She appears well-developed and well-nourished. No distress.  Cardiovascular: Normal rate and regular rhythm.   Pulmonary/Chest: Effort normal and breath sounds normal.  Abdominal: Soft. Bowel sounds are normal. She exhibits no distension. There is tenderness (very mild on deep palpation).  Neurological: She is alert and oriented to person, place, and time.  Skin: Skin is warm.  Psychiatric: She has a normal mood and affect. Her behavior is normal. Judgment and thought content normal.   BP (!) 138/91   Pulse 70   Temp (!) 97 F (36.1 C) (Oral)       Assessment & Plan:   1. Irritable bowel syndrome, unspecified type   2. SBO (small bowel obstruction) ([place])   3. Hospital discharge follow-up    Hospital records reviewed Watch diet Increase fiber in diet Meds ordered this encounter  Medications  . hyoscyamine (LEVBID) 0.375 MG 12 hr tablet    Sig: Take 1 tablet (0.375 mg total) by mouth 2 (two) times daily.      Dispense:  60 tablet    Refill:  3    Order Specific Question:   Supervising Provider    Answer:   Eustaquio Maize [[id number]]   Follow up in 3 months  [place], FNP

## 2017-06-14 ENCOUNTER — Other Ambulatory Visit: Payer: Self-pay | Admitting: Nurse Practitioner

## 2017-06-14 DIAGNOSIS — F411 Generalized anxiety disorder: Secondary | ICD-10-CM

## 2017-06-14 DIAGNOSIS — G114 Hereditary spastic paraplegia: Secondary | ICD-10-CM

## 2017-06-15 NOTE — Telephone Encounter (Signed)
Last seen 06/04/17  MMM  If approved route to nurse to call into [facility]   [phone]

## 2017-06-16 ENCOUNTER — Telehealth: Payer: Self-pay | Admitting: *Deleted

## 2017-06-18 ENCOUNTER — Other Ambulatory Visit: Payer: Self-pay | Admitting: Nurse Practitioner

## 2017-06-18 ENCOUNTER — Telehealth: Payer: Self-pay | Admitting: Nurse Practitioner

## 2017-06-18 DIAGNOSIS — G114 Hereditary spastic paraplegia: Secondary | ICD-10-CM

## 2017-06-18 DIAGNOSIS — F411 Generalized anxiety disorder: Secondary | ICD-10-CM

## 2017-06-18 MED ORDER — ALPRAZOLAM 0.5 MG PO TABS: 0.5000 mg | ORAL_TABLET | Freq: Two times a day (BID) | ORAL | 1 refills | Status: DC | PRN

## 2017-06-18 NOTE — Progress Notes (Unsigned)
Please call in xanax 0.5mg 1 po BID #60 with 1 refills 

## 2017-06-18 NOTE — Telephone Encounter (Signed)
Patient states pharmacy told her they dont have her xanax, and we keep telling her its been called in. Please advise.

## 2017-06-18 NOTE — Telephone Encounter (Signed)
RX called into [facility] Pt notified

## 2017-06-19 NOTE — Telephone Encounter (Signed)
Last seen 06/04/17  MMM  If approved route to nurse to call into [facility]  [phone]

## 2017-08-01 NOTE — Telephone Encounter (Signed)
This call will be closed as there were multiple telephone encounters regarding this and it has been take care of.

## 2017-08-10 ENCOUNTER — Other Ambulatory Visit: Payer: Self-pay

## 2017-08-10 DIAGNOSIS — F411 Generalized anxiety disorder: Secondary | ICD-10-CM

## 2017-08-10 MED ORDER — ALPRAZOLAM 0.5 MG PO TABS: 0.5000 mg | ORAL_TABLET | Freq: Two times a day (BID) | ORAL | 1 refills | Status: DC | PRN

## 2017-08-10 NOTE — Telephone Encounter (Signed)
Last seen 06/04/17  MMM

## 2017-08-16 ENCOUNTER — Other Ambulatory Visit: Payer: Self-pay | Admitting: Nurse Practitioner

## 2017-08-16 DIAGNOSIS — G114 Hereditary spastic paraplegia: Secondary | ICD-10-CM

## 2017-09-14 ENCOUNTER — Telehealth: Payer: Self-pay | Admitting: Nurse Practitioner

## 2017-09-21 ENCOUNTER — Ambulatory Visit: Payer: Medicare Other | Admitting: Nurse Practitioner

## 2017-09-21 ENCOUNTER — Telehealth: Payer: Self-pay | Admitting: Nurse Practitioner

## 2017-09-21 DIAGNOSIS — G114 Hereditary spastic paraplegia: Secondary | ICD-10-CM

## 2017-09-21 MED ORDER — TIZANIDINE HCL 4 MG PO TABS: 4.0000 mg | ORAL_TABLET | Freq: Three times a day (TID) | ORAL | 0 refills | Status: DC

## 2017-09-21 NOTE — Telephone Encounter (Signed)
zanaflex rx sent to pharmacy

## 2017-09-24 ENCOUNTER — Other Ambulatory Visit: Payer: Self-pay | Admitting: Nurse Practitioner

## 2017-09-24 DIAGNOSIS — G114 Hereditary spastic paraplegia: Secondary | ICD-10-CM

## 2017-09-25 NOTE — Telephone Encounter (Signed)
Left message, script was given to [place].

## 2017-09-28 ENCOUNTER — Ambulatory Visit (INDEPENDENT_AMBULATORY_CARE_PROVIDER_SITE_OTHER): Payer: Medicare Other | Admitting: Nurse Practitioner

## 2017-09-28 ENCOUNTER — Encounter: Payer: Self-pay | Admitting: Nurse Practitioner

## 2017-09-28 VITALS — BP 127/85 | HR 88 | Temp 96.9°F

## 2017-09-28 DIAGNOSIS — F17200 Nicotine dependence, unspecified, uncomplicated: Secondary | ICD-10-CM | POA: Diagnosis not present

## 2017-09-28 DIAGNOSIS — G114 Hereditary spastic paraplegia: Secondary | ICD-10-CM

## 2017-09-28 DIAGNOSIS — E43 Unspecified severe protein-calorie malnutrition: Secondary | ICD-10-CM | POA: Diagnosis not present

## 2017-09-28 DIAGNOSIS — K589 Irritable bowel syndrome without diarrhea: Secondary | ICD-10-CM

## 2017-09-28 DIAGNOSIS — K2100 Gastro-esophageal reflux disease with esophagitis, without bleeding: Secondary | ICD-10-CM

## 2017-09-28 DIAGNOSIS — D509 Iron deficiency anemia, unspecified: Secondary | ICD-10-CM | POA: Diagnosis not present

## 2017-09-28 DIAGNOSIS — E876 Hypokalemia: Secondary | ICD-10-CM | POA: Diagnosis not present

## 2017-09-28 DIAGNOSIS — F411 Generalized anxiety disorder: Secondary | ICD-10-CM

## 2017-09-28 MED ORDER — ESCITALOPRAM OXALATE 10 MG PO TABS: 10.0000 mg | ORAL_TABLET | Freq: Every day | ORAL | 5 refills | Status: DC

## 2017-09-28 MED ORDER — PANTOPRAZOLE SODIUM 40 MG PO TBEC: 40.0000 mg | DELAYED_RELEASE_TABLET | Freq: Two times a day (BID) | ORAL | 1 refills | Status: DC

## 2017-09-28 MED ORDER — TIZANIDINE HCL 4 MG PO TABS: 4.0000 mg | ORAL_TABLET | Freq: Three times a day (TID) | ORAL | 0 refills | Status: DC

## 2017-09-28 MED ORDER — ALPRAZOLAM 0.5 MG PO TABS: 0.5000 mg | ORAL_TABLET | Freq: Two times a day (BID) | ORAL | 1 refills | Status: DC | PRN

## 2017-09-28 MED ORDER — BACLOFEN 10 MG PO TABS: ORAL_TABLET | ORAL | 5 refills | Status: DC

## 2017-09-28 NOTE — Patient Instructions (Signed)

## 2017-09-28 NOTE — Progress Notes (Signed)
Subjective:    Patient ID: Tiffany Stewart, female    DOB: April 09, 1971, 47 y.o.   MRN: [id number]  HPI  Tiffany Stewart is here today for follow up of chronic medical problem.  Outpatient Encounter Medications as of 09/28/2017  Medication Sig  . acetaminophen (TYLENOL) 500 MG tablet Take 1,500 mg by mouth every 6 (six) hours as needed for moderate pain.  [name] ALPRAZolam (XANAX) 0.5 MG tablet Take 1 tablet (0.5 mg total) by mouth 2 (two) times daily as needed. for anxiety  . feeding supplement, ENSURE ENLIVE, (ENSURE ENLIVE) LIQD Take 237 mLs by mouth 2 (two) times daily between meals.  . hyoscyamine (LEVBID) 0.375 MG 12 hr tablet Take 1 tablet (0.375 mg total) by mouth 2 (two) times daily.  . pantoprazole (PROTONIX) 40 MG tablet Take 1 tablet (40 mg total) by mouth 2 (two) times daily before a meal. (Patient taking differently: Take 40 mg by mouth daily as needed (acid reflux). )  . tiZANidine (ZANAFLEX) 4 MG tablet Take 1 tablet (4 mg total) by mouth 3 (three) times daily.     1. Gastroesophageal reflux disease with esophagitis  Patient is on protonix which works well to keep symptoms under control. She has a history of gastric ulcer with perforation and needs to stay on meds.  2. Irritable bowel syndrome, unspecified type  Has had no recent flare ups  3. HSP (hereditary spastic paraplegia) ([facility])  She is wheel chair bound. Takes baclofen for muscle spasms. They stopped he baclofen due to possible over dose , so now her left leg hurts all the time and is drawing up.  4. Anxiety state  Stays anxious. Had episode of altered mental status several months ago and was not sure f she overtook xanax or baclofen. She is still  On xanax 0.45m BID  5. Hypokalemia  No c/o muscle cramps  6. Iron deficiency anemia, unspecified iron deficiency anemia type  Stays fatigued- needs labs checked today.  7. Protein-calorie malnutrition, severe  Has a very poor appetite. Does not eat or drink protein supplements    8. TOBACCO ABUSE  Has no desire to stop smoking    New complaints: None today  Social history: Under a lot of stress with family matters which she dd not want to elaborate on.    Review of Systems  Constitutional: Positive for appetite change (decreased- not unusual for her) and fatigue.  HENT: Negative.   Respiratory: Negative for cough and shortness of breath.   Cardiovascular: Negative for chest pain, palpitations and leg swelling.  Gastrointestinal: Negative.   Genitourinary: Negative.   Neurological: Negative.        Objective:   Physical Exam  Constitutional: She is oriented to person, place, and time. She appears well-developed and well-nourished. No distress.  HENT:  Nose: Nose normal.  Mouth/Throat: Oropharynx is clear and moist.  Eyes: EOM are normal.  Neck: Trachea normal, normal range of motion and full passive range of motion without pain. Neck supple. No JVD present. Carotid bruit is not present. No thyromegaly present.  Cardiovascular: Normal rate, regular rhythm, normal heart sounds and intact distal pulses. Exam reveals no gallop and no friction rub.  No murmur heard. Pulmonary/Chest: Effort normal and breath sounds normal.  Abdominal: Soft. Bowel sounds are normal. She exhibits no distension and no mass. There is no tenderness.  Musculoskeletal: Normal range of motion.  In wheel chair- unable to get in exam table. Patient leaning to right- unable to sit  up straight Feet are internally rotated    Lymphadenopathy:    She has no cervical adenopathy.  Neurological: She is alert and oriented to person, place, and time. She has normal reflexes.  Skin: Skin is warm and dry.  Psychiatric: She has a normal mood and affect. Her behavior is normal. Judgment and thought content normal.   BP 127/85   Pulse 88   Temp (!) 96.9 F (36.1 C) (Oral)          Assessment & Plan:  1. Gastroesophageal reflux disease with esophagitis Avoid spicy foods Do not eat  2 hours prior to bedtime - pantoprazole (PROTONIX) 40 MG tablet; Take 1 tablet (40 mg total) by mouth 2 (two) times daily before a meal.  Dispense: 60 tablet; Refill: 1  2. Irritable bowel syndrome, unspecified type Watch diet- avoid foods that cause flare up  3. HSP (hereditary spastic paraplegia) ([place]) Added baclofen back to eds- patient was given specific instructions to only take as rx and no more - tiZANidine (ZANAFLEX) 4 MG tablet; Take 1 tablet (4 mg total) by mouth 3 (three) times daily.  Dispense: 90 tablet; Refill: 0 - baclofen (LIORESAL) 10 MG tablet; TAKE 1 TABLET BY MOUTH TWICE DAILY  Dispense: 60 tablet; Refill: 5  4. Anxiety state Stress management - escitalopram (LEXAPRO) 10 MG tablet; Take 1 tablet (10 mg total) by mouth daily.  Dispense: 30 tablet; Refill: 5 - ALPRAZolam (XANAX) 0.5 MG tablet; Take 1 tablet (0.5 mg total) by mouth 2 (two) times daily as needed. for anxiety  Dispense: 60 tablet; Refill: 1  5. Hypokalemia - CMP14+EGFR - Lipid panel  6. Iron deficiency anemia, unspecified iron deficiency anemia type Labs pending - Anemia Profile B  7. Protein-calorie malnutrition, severe Increase protein in diet  8. TOBACCO ABUSE Smoking cessation encouraged      Labs pending Health maintenance reviewed Diet and exercise encouraged Continue all meds Follow up  In 3 months   [place], FNP

## 2017-09-29 LAB — LIPID PANEL
CHOL/HDL RATIO: 4.3 ratio (ref 0.0–4.4)
Cholesterol, Total: 177 mg/dL (ref 100–199)
HDL: 41 mg/dL (ref >39)
LDL Calculated: 106 mg/dL — ABNORMAL HIGH (ref 0–99)
Triglycerides: 151 mg/dL — ABNORMAL HIGH (ref 0–149)
VLDL Cholesterol Cal: 30 mg/dL (ref 5–40)

## 2017-09-29 LAB — ANEMIA PROFILE B
BASOS ABS: 0 10*3/uL (ref 0.0–0.2)
BASOS: 0 %
EOS (ABSOLUTE): 0.1 10*3/uL (ref 0.0–0.4)
Eos: 1 %
Ferritin: 25 ng/mL (ref 15–150)
Folate: 5.3 ng/mL (ref >3.0)
HEMATOCRIT: 40 % (ref 34.0–46.6)
Hemoglobin: 13.1 g/dL (ref 11.1–15.9)
IRON: 119 ug/dL (ref 27–159)
Immature Grans (Abs): 0 10*3/uL (ref 0.0–0.1)
Immature Granulocytes: 0 %
Iron Saturation: 34 % (ref 15–55)
Lymphocytes Absolute: 2.3 10*3/uL (ref 0.7–3.1)
Lymphs: 32 %
MCH: 32.3 pg (ref 26.6–33.0)
MCHC: 32.8 g/dL (ref 31.5–35.7)
MCV: 99 fL — AB (ref 79–97)
MONOCYTES: 4 %
Monocytes Absolute: 0.3 10*3/uL (ref 0.1–0.9)
NEUTROS ABS: 4.5 10*3/uL (ref 1.4–7.0)
Neutrophils: 63 %
PLATELETS: 323 10*3/uL (ref 150–379)
RBC: 4.05 x10E6/uL (ref 3.77–5.28)
RDW: 14.2 % (ref 12.3–15.4)
RETIC CT PCT: 1.6 % (ref 0.6–2.6)
TIBC: 354 ug/dL (ref 250–450)
UIBC: 235 ug/dL (ref 131–425)
VITAMIN B 12: 263 pg/mL (ref 232–1245)
WBC: 7.2 10*3/uL (ref 3.4–10.8)

## 2017-09-29 LAB — CMP14+EGFR
A/G RATIO: 1.8 (ref 1.2–2.2)
ALT: 8 IU/L (ref 0–32)
AST: 13 IU/L (ref 0–40)
Albumin: 4.4 g/dL (ref 3.5–5.5)
Alkaline Phosphatase: 74 IU/L (ref 39–117)
BUN / CREAT RATIO: 24 — AB (ref 9–23)
BUN: 11 mg/dL (ref 6–24)
Bilirubin Total: <0.2 mg/dL (ref 0.0–1.2)
CALCIUM: 9 mg/dL (ref 8.7–10.2)
CO2: 24 mmol/L (ref 20–29)
Chloride: 102 mmol/L (ref 96–106)
Creatinine, Ser: 0.45 mg/dL — ABNORMAL LOW (ref 0.57–1.00)
GFR calc non Af Amer: 122 mL/min/{1.73_m2} (ref >59)
GFR, EST AFRICAN AMERICAN: 141 mL/min/{1.73_m2} (ref >59)
GLOBULIN, TOTAL: 2.5 g/dL (ref 1.5–4.5)
Glucose: 84 mg/dL (ref 65–99)
POTASSIUM: 3.9 mmol/L (ref 3.5–5.2)
SODIUM: 142 mmol/L (ref 134–144)
TOTAL PROTEIN: 6.9 g/dL (ref 6.0–8.5)

## 2017-10-04 NOTE — Telephone Encounter (Signed)
Pt notified of referral  

## 2017-10-04 NOTE — Telephone Encounter (Signed)
Pt needs referral to pain clinic for leg pain

## 2017-10-04 NOTE — Telephone Encounter (Signed)
Referral made 

## 2017-10-23 ENCOUNTER — Encounter: Payer: Self-pay | Admitting: Physical Medicine & Rehabilitation

## 2017-11-02 ENCOUNTER — Encounter: Payer: Medicare Other | Attending: Physical Medicine & Rehabilitation

## 2017-11-02 ENCOUNTER — Encounter: Payer: Self-pay | Admitting: Physical Medicine & Rehabilitation

## 2017-11-02 ENCOUNTER — Ambulatory Visit (HOSPITAL_BASED_OUTPATIENT_CLINIC_OR_DEPARTMENT_OTHER): Payer: Medicare Other | Admitting: Physical Medicine & Rehabilitation

## 2017-11-02 DIAGNOSIS — R200 Anesthesia of skin: Secondary | ICD-10-CM | POA: Insufficient documentation

## 2017-11-02 DIAGNOSIS — R252 Cramp and spasm: Secondary | ICD-10-CM | POA: Diagnosis not present

## 2017-11-02 DIAGNOSIS — K219 Gastro-esophageal reflux disease without esophagitis: Secondary | ICD-10-CM | POA: Insufficient documentation

## 2017-11-02 DIAGNOSIS — G5603 Carpal tunnel syndrome, bilateral upper limbs: Secondary | ICD-10-CM | POA: Diagnosis not present

## 2017-11-02 DIAGNOSIS — G8929 Other chronic pain: Secondary | ICD-10-CM | POA: Insufficient documentation

## 2017-11-02 DIAGNOSIS — G43909 Migraine, unspecified, not intractable, without status migrainosus: Secondary | ICD-10-CM | POA: Insufficient documentation

## 2017-11-02 DIAGNOSIS — F329 Major depressive disorder, single episode, unspecified: Secondary | ICD-10-CM | POA: Insufficient documentation

## 2017-11-02 DIAGNOSIS — G114 Hereditary spastic paraplegia: Secondary | ICD-10-CM

## 2017-11-02 DIAGNOSIS — F17210 Nicotine dependence, cigarettes, uncomplicated: Secondary | ICD-10-CM | POA: Insufficient documentation

## 2017-11-02 DIAGNOSIS — F419 Anxiety disorder, unspecified: Secondary | ICD-10-CM | POA: Insufficient documentation

## 2017-11-02 MED ORDER — BACLOFEN 20 MG PO TABS: ORAL_TABLET | ORAL | 1 refills | Status: DC

## 2017-11-02 MED ORDER — TIZANIDINE HCL 4 MG PO TABS: 4.0000 mg | ORAL_TABLET | Freq: Four times a day (QID) | ORAL | 1 refills | Status: DC | PRN

## 2017-11-02 NOTE — Patient Instructions (Signed)
Need to be fitting for brace to prevent Left knee buckling   If medication change is not helpful may need to increase again

## 2017-11-02 NOTE — Progress Notes (Signed)
Subjective:    Patient ID: Tiffany Stewart, female    DOB: 21-Jul-1971, 47 y.o.   MRN: [id number]  HPI 47 yo female with hx of hereditary spastic paraplegia who was a previous patient at this clinic but has not been seen in 4 years.  Patient states her abscess was due to caregiver responsibilities for her daughter with seizure disorder.  Her daughter is fortunately doing better. The patient has in the meantime had some progressive lower extremity weakness as well as increasing spasms.  Her spasms are mainly in the thighs left side worse than right side.  Her knees tend to pull together and at night she has spasms that kicked her legs out straight and make them shake.  In addition patient has difficulty moving her ankles and feels like her feet are turning in a little bit more. She has not experienced any problems in her arms other than prior history of carpal tunnel syndrome which has been diagnosed with EMG several years ago.  She did have some relief with carpal tunnel injections using corticosteroid.  Last injection was about 4 years ago.  WC bound Mod I ADL except needs help gettng out of tub  Mod I transfers in and out of bed except has had some falls with Left knee buckling  Interval medical history she has had hospitalizations for small bowel obstruction treated medically as well as acute kidney injury and unresponsiveness.  The latter was induced by taking 60 mg of baclofen to relieve spasticity.   Spasms in boths knees and both hips  Spasms awaken her at night  Takes Baclofen 20mg  and tizanidine 12mg   Once a day The patient states that when she takes baclofen 10 mg  not get adequate relief. Pain Inventory Average Pain 10 Pain Right Now 10 My pain is aching  In the last 24 hours, has pain interfered with the following? General activity 0 Relation with others 0 Enjoyment of life 0 What TIME of day is your pain at its worst? all Sleep (in general) Fair  Pain is worse with:  standing Pain improves with: nothing Relief from Meds: 0  Mobility how many minutes can you walk? 0 ability to climb steps?  no do you drive?  no use a wheelchair Do you have any goals in this area?  no  Function I need assistance with the following:  bathing and meal prep  Neuro/Psych numbness trouble walking  Prior Studies new  Physicians involved in your care new   Family History  Problem Relation Age of Onset  . Diabetes Father   . Cancer Father        lung cancer  . Heart disease Father   . Heart disease Maternal Grandmother   . Stroke Maternal Grandfather   . Diabetes Paternal Grandmother   . Colon cancer Neg Hx    Social History   Socioeconomic History  . Marital status: Single    Spouse name: Not on file  . Number of children: Not on file  . Years of education: Not on file  . Highest education level: Not on file  Occupational History  . Not on file  Social Needs  . Financial resource strain: Not on file  . Food insecurity:    Worry: Not on file    Inability: Not on file  . Transportation needs:    Medical: Not on file    Non-medical: Not on file  Tobacco Use  . Smoking status: Current Every Day Smoker  Packs/day: 1.00    Years: 27.00    Pack years: 27.00    Types: Cigarettes  . Smokeless tobacco: Never Used  Substance and Sexual Activity  . Alcohol use: No  . Drug use: No  . Sexual activity: Yes    Birth control/protection: None  Lifestyle  . Physical activity:    Days per week: Not on file    Minutes per session: Not on file  . Stress: Not on file  Relationships  . Social connections:    Talks on phone: Not on file    Gets together: Not on file    Attends religious service: Not on file    Active member of club or organization: Not on file    Attends meetings of clubs or organizations: Not on file    Relationship status: Not on file  Other Topics Concern  . Not on file  Social History Narrative  . Not on file   Past  Surgical History:  Procedure Laterality Date  . CESAREAN SECTION    . CHOLECYSTECTOMY    . ENDOMETRIAL ABLATION  2000   Past Medical History:  Diagnosis Date  . Anxiety   . Chronic pain   . Depression   . GERD (gastroesophageal reflux disease)   . HSP (hereditary spastic paraplegia) ([place])   . Migraines    BP (!) 141/97   Pulse 75   Resp 14   SpO2 98%   Opioid Risk Score:   Fall Risk Score:  `1  Depression screen PHQ 2/9  Depression screen [facility] 2/9 11/02/2017 09/28/2017 06/04/2017 02/27/2017 12/01/2016 10/11/2016 06/29/2016  Decreased Interest 0 3 1 0 0 0 0  Down, Depressed, Hopeless 0 1 2 0 0 0 0  PHQ - 2 Score 0 4 3 0 0 0 0  Altered sleeping 0 2 1 - - - -  Tired, decreased energy 0 0 2 - - - -  Change in appetite 0 0 3 - - - -  Feeling bad or failure about yourself  0 0 0 - - - -  Trouble concentrating 0 1 0 - - - -  Moving slowly or fidgety/restless 0 0 0 - - - -  Suicidal thoughts 0 0 0 - - - -  PHQ-9 Score 0 7 9 - - - -     Review of Systems  Constitutional: Negative.   HENT: Negative.   Eyes: Negative.   Respiratory: Negative.   Cardiovascular: Negative.   Gastrointestinal: Positive for abdominal pain.  Endocrine: Negative.   Genitourinary: Negative.   Musculoskeletal: Positive for arthralgias.  Skin: Negative.   Allergic/Immunologic: Negative.   Neurological: Positive for numbness.  Hematological: Negative.   Psychiatric/Behavioral: Negative.   All other systems reviewed and are negative.      Objective:   Physical Exam  Constitutional: She is oriented to person, place, and time. She appears well-developed and well-nourished. No distress.  HENT:  Head: Normocephalic and atraumatic.  Eyes: Pupils are equal, round, and reactive to light. Conjunctivae and EOM are normal.  Neck: Normal range of motion.  Cardiovascular: Normal rate, regular rhythm and normal heart sounds. Exam reveals no friction rub.  No murmur heard. Pulmonary/Chest: Effort normal and  breath sounds normal. No respiratory distress. She has no wheezes.  Abdominal: Soft. Bowel sounds are normal. She exhibits no distension. There is no tenderness.  Neurological: She is alert and oriented to person, place, and time. She displays no atrophy and no tremor. No cranial nerve deficit or  sensory deficit. She exhibits abnormal muscle tone. Coordination and gait abnormal.  Reflex Scores:      Patellar reflexes are 3+ on the right side and 3+ on the left side.      Achilles reflexes are 3+ on the right side and 3+ on the left side. Reduced Heel to shin in BLE  Non ambulatory  Skin: Skin is warm and dry. She is not diaphoretic.  Psychiatric: She has a normal mood and affect.  Nursing note and vitals reviewed.   Motor strength is 5/5 bilateral deltoid, bicep, tricep, finger flexors and extensors. There is no hand intrinsic atrophy.  Mild numbness described in both hands at the fingers. Lower extremities have 3- hip flexor 3 at the knee extensor 2- ankle dorsiflexor and plantar flexor. Tone Ashworth grade 3 at the hip adductor's and 3 at the plantar flexors as well as foot inverters. There is no evidence of clonus at the knee or ankle.      Assessment & Plan:  1.  Hereditary spastic paraplegia.  As expected she has had some further progression in her weakness over the last several years.  It is primarily affecting the left lower extremity and affecting transfers.  We discussed treatment options, will order a hinged knee orthosis with a locking knee joint that she can use to stabilize herself during transfers.  In terms of her spasticity she can increase her Baclofen 20 mg twice daily and may have to increase further to 3 times daily next visit if no significant relief.  Will also increase the tizanidine to 4 mg 4 times daily.  We discussed other spasticity treatments including botulinum toxin.  She is afraid of getting multiple injections so she would like to only consider this if the  medication management fails.  2.  Carpal tunnel syndrome both hands she has had no significant progression of the symptoms these are most likely related to her frequent use of wheelchair.  Return to clinic in 6 weeks.  [employer] for Knee orthosis with drop lock to prevent knee buckling

## 2017-12-13 ENCOUNTER — Telehealth: Payer: Self-pay | Admitting: Nurse Practitioner

## 2017-12-13 ENCOUNTER — Other Ambulatory Visit: Payer: Self-pay | Admitting: Nurse Practitioner

## 2017-12-13 DIAGNOSIS — F411 Generalized anxiety disorder: Secondary | ICD-10-CM

## 2017-12-13 MED ORDER — ALPRAZOLAM 0.5 MG PO TABS: 0.5000 mg | ORAL_TABLET | Freq: Two times a day (BID) | ORAL | 0 refills | Status: DC | PRN

## 2017-12-13 NOTE — Telephone Encounter (Signed)
Patient is out of RFs. Sent in 1 month to [place]. Patient notified

## 2017-12-14 ENCOUNTER — Ambulatory Visit: Payer: Medicare Other | Admitting: Physical Medicine & Rehabilitation

## 2017-12-21 ENCOUNTER — Ambulatory Visit: Payer: Medicare Other | Admitting: Physical Medicine & Rehabilitation

## 2017-12-27 ENCOUNTER — Ambulatory Visit: Payer: Medicare Other | Admitting: Physical Medicine & Rehabilitation

## 2018-01-01 ENCOUNTER — Ambulatory Visit: Payer: Medicare Other | Admitting: Physical Medicine & Rehabilitation

## 2018-01-01 ENCOUNTER — Ambulatory Visit: Payer: Medicare Other

## 2018-01-15 ENCOUNTER — Other Ambulatory Visit: Payer: Self-pay | Admitting: Physical Medicine & Rehabilitation

## 2018-01-15 ENCOUNTER — Other Ambulatory Visit: Payer: Self-pay | Admitting: Nurse Practitioner

## 2018-01-15 DIAGNOSIS — F411 Generalized anxiety disorder: Secondary | ICD-10-CM

## 2018-01-15 DIAGNOSIS — G114 Hereditary spastic paraplegia: Secondary | ICD-10-CM

## 2018-01-28 ENCOUNTER — Ambulatory Visit (INDEPENDENT_AMBULATORY_CARE_PROVIDER_SITE_OTHER): Payer: Medicare Other | Admitting: Nurse Practitioner

## 2018-01-28 ENCOUNTER — Encounter: Payer: Self-pay | Admitting: Nurse Practitioner

## 2018-01-28 VITALS — BP 138/92 | HR 92 | Temp 98.0°F

## 2018-01-28 DIAGNOSIS — F411 Generalized anxiety disorder: Secondary | ICD-10-CM | POA: Diagnosis not present

## 2018-01-28 DIAGNOSIS — D509 Iron deficiency anemia, unspecified: Secondary | ICD-10-CM | POA: Diagnosis not present

## 2018-01-28 DIAGNOSIS — K589 Irritable bowel syndrome without diarrhea: Secondary | ICD-10-CM

## 2018-01-28 DIAGNOSIS — F17200 Nicotine dependence, unspecified, uncomplicated: Secondary | ICD-10-CM

## 2018-01-28 DIAGNOSIS — K2100 Gastro-esophageal reflux disease with esophagitis, without bleeding: Secondary | ICD-10-CM

## 2018-01-28 DIAGNOSIS — G114 Hereditary spastic paraplegia: Secondary | ICD-10-CM

## 2018-01-28 MED ORDER — PANTOPRAZOLE SODIUM 40 MG PO TBEC: 40.0000 mg | DELAYED_RELEASE_TABLET | Freq: Two times a day (BID) | ORAL | 1 refills | Status: DC

## 2018-01-28 MED ORDER — ALPRAZOLAM 0.5 MG PO TABS: 0.5000 mg | ORAL_TABLET | Freq: Two times a day (BID) | ORAL | 0 refills | Status: DC | PRN

## 2018-01-28 MED ORDER — TIZANIDINE HCL 4 MG PO TABS: ORAL_TABLET | ORAL | 2 refills | Status: DC

## 2018-01-28 MED ORDER — ESCITALOPRAM OXALATE 10 MG PO TABS: 10.0000 mg | ORAL_TABLET | Freq: Every day | ORAL | 1 refills | Status: DC

## 2018-01-28 MED ORDER — BACLOFEN 20 MG PO TABS
ORAL_TABLET | ORAL | 1 refills | Status: DC
Start: 2018-01-28 — End: 2018-03-27

## 2018-01-28 NOTE — Patient Instructions (Signed)

## 2018-01-28 NOTE — Progress Notes (Signed)
Subjective:    Patient ID: Tiffany Stewart, female    DOB: 06-09-1971, 47 y.o.   MRN: [id number]   Chief Complaint: Medical Management of Chronic Issues   HPI:  1. Irritable bowel syndrome, unspecified type  She is currently not taking anything right now. She says she seems to be doing ok right now. Denies diarrhea or constipation.  2. Gastroesophageal reflux disease with esophagitis  Still has reflux. protonix helps with symptoms. She experiences symptoms if she is not taking meds  3. Iron deficiency anemia, unspecified iron deficiency anemia type  She does ot c/o fatigue. We will recheck labs today.  4. Anxiety state  Is on xanax bid and says she becomes very anxious if she does not take meds  5. TOBACCO ABUSE  She says she smokes about 1/2 pack a day. Doe snot want to quit.  6. HSP (hereditary spastic paraplegia) ([place])  She is in wheel chair most of time. She can transfer herself to and from toilet. Sh eis having a brace made for right leg so she can be more stable with transfers.    Outpatient Encounter Medications as of 01/28/2018  Medication Sig  . acetaminophen (TYLENOL) 500 MG tablet Take 1,500 mg by mouth every 6 (six) hours as needed for moderate pain.  [name] ALPRAZolam (XANAX) 0.5 MG tablet Take 1 tablet (0.5 mg total) by mouth 2 (two) times daily as needed. for anxiety  . baclofen (LIORESAL) 20 MG tablet TAKE 1 TABLET BY MOUTH TWICE DAILY  . escitalopram (LEXAPRO) 10 MG tablet Take 10 mg by mouth daily.  . feeding supplement, ENSURE ENLIVE, (ENSURE ENLIVE) LIQD Take 237 mLs by mouth 2 (two) times daily between meals.  . pantoprazole (PROTONIX) 40 MG tablet Take 1 tablet (40 mg total) by mouth 2 (two) times daily before a meal.  . tiZANidine (ZANAFLEX) 4 MG tablet TAKE 1 TABLET EVERY 6 HOURS AS NEEDED FOR MUSCLE SPASMS       New complaints: None today  Social history: Lives with her niece, daughter and their boyfriends Has daughter that has seizure disorder and they  are unable to afford her going to neurologist  Review of Systems  Constitutional: Negative for activity change and appetite change.  HENT: Negative.   Eyes: Negative for pain.  Respiratory: Negative for shortness of breath.   Cardiovascular: Negative for chest pain, palpitations and leg swelling.  Gastrointestinal: Negative for abdominal pain.  Endocrine: Negative for polydipsia.  Genitourinary: Negative.   Musculoskeletal: Positive for gait problem and myalgias.  Skin: Negative for rash.  Neurological: Negative for dizziness, weakness and headaches.  Hematological: Does not bruise/bleed easily.  Psychiatric/Behavioral: The patient is nervous/anxious.   All other systems reviewed and are negative.      Objective:   Physical Exam  Constitutional: She is oriented to person, place, and time. She appears well-developed and well-nourished. No distress.  HENT:  Head: Normocephalic.  Nose: Nose normal.  Mouth/Throat: Oropharynx is clear and moist.  Eyes: Pupils are equal, round, and reactive to light. EOM are normal.  Neck: Normal range of motion. Neck supple. No JVD present. Carotid bruit is not present.  Cardiovascular: Normal rate, regular rhythm, normal heart sounds and intact distal pulses.  Pulmonary/Chest: Effort normal and breath sounds normal. No respiratory distress. She has no wheezes. She has no rales. She exhibits no tenderness.  Abdominal: Soft. Normal appearance, normal aorta and bowel sounds are normal. She exhibits no distension, no abdominal bruit, no pulsatile midline mass and  no mass. There is no splenomegaly or hepatomegaly. There is no tenderness.  Musculoskeletal: Normal range of motion. She exhibits no edema.  In wheel chair Limited rom of right knee due to muscle rigidity  Lymphadenopathy:    She has no cervical adenopathy.  Neurological: She is alert and oriented to person, place, and time. She displays abnormal reflex (hypractive).  Skin: Skin is warm and  dry.  Psychiatric: She has a normal mood and affect. Her behavior is normal. Judgment and thought content normal.  Nursing note and vitals reviewed.  BP (!) 138/92   Pulse 92   Temp 98 F (36.7 C) (Oral)         Assessment & Plan:  Tiffany Stewart comes in today with chief complaint of Medical Management of Chronic Issues   Diagnosis and orders addressed:  1. Irritable bowel syndrome, unspecified type watch diet to not trigger flare up  2. Gastroesophageal reflux disease with esophagitis Avoid spicy foods Do not eat 2 hours prior to bedtime - pantoprazole (PROTONIX) 40 MG tablet; Take 1 tablet (40 mg total) by mouth 2 (two) times daily before a meal.  Dispense: 60 tablet; Refill: 1  3. Iron deficiency anemia, unspecified iron deficiency anemia type Labs pending  4. Anxiety state Stress management - ALPRAZolam (XANAX) 0.5 MG tablet; Take 1 tablet (0.5 mg total) by mouth 2 (two) times daily as needed. for anxiety  Dispense: 60 tablet; Refill: 0 - escitalopram (LEXAPRO) 10 MG tablet; Take 1 tablet (10 mg total) by mouth daily.  Dispense: 90 tablet; Refill: 1  5. TOBACCO ABUSE encouraged to cut back on smoking  6. HSP (hereditary spastic paraplegia) ([place]) Fall prevention - tiZANidine (ZANAFLEX) 4 MG tablet; TAKE 1 TABLET EVERY 6 HOURS AS NEEDED FOR MUSCLE SPASMS  Dispense: 120 tablet; Refill: 2 - baclofen (LIORESAL) 20 MG tablet; TAKE 1 TABLET BY MOUTH TWICE DAILY  Dispense: 60 tablet; Refill: 1   Labs pending Health Maintenance reviewed Diet and exercise encouraged  Follow up plan: 3 months   Mary-Margaret Hassell Done, FNP

## 2018-01-28 NOTE — Addendum Note (Signed)
Addended by: Rolena Infante on: 01/28/2018 04:08 PM   Modules accepted: Orders

## 2018-01-29 LAB — CMP14+EGFR
ALBUMIN: 4.4 g/dL (ref 3.5–5.5)
ALT: 8 IU/L (ref 0–32)
AST: 8 IU/L (ref 0–40)
Albumin/Globulin Ratio: 1.8 (ref 1.2–2.2)
Alkaline Phosphatase: 66 IU/L (ref 39–117)
BUN/Creatinine Ratio: 44 — ABNORMAL HIGH (ref 9–23)
BUN: 19 mg/dL (ref 6–24)
Bilirubin Total: <0.2 mg/dL (ref 0.0–1.2)
CHLORIDE: 106 mmol/L (ref 96–106)
CO2: 22 mmol/L (ref 20–29)
Calcium: 9.4 mg/dL (ref 8.7–10.2)
Creatinine, Ser: 0.43 mg/dL — ABNORMAL LOW (ref 0.57–1.00)
GFR calc non Af Amer: 123 mL/min/{1.73_m2} (ref >59)
GFR, EST AFRICAN AMERICAN: 142 mL/min/{1.73_m2} (ref >59)
GLUCOSE: 86 mg/dL (ref 65–99)
Globulin, Total: 2.4 g/dL (ref 1.5–4.5)
POTASSIUM: 3.9 mmol/L (ref 3.5–5.2)
SODIUM: 142 mmol/L (ref 134–144)
TOTAL PROTEIN: 6.8 g/dL (ref 6.0–8.5)

## 2018-01-29 LAB — ANEMIA PROFILE B
BASOS: 0 %
Basophils Absolute: 0 10*3/uL (ref 0.0–0.2)
EOS (ABSOLUTE): 0.1 10*3/uL (ref 0.0–0.4)
Eos: 2 %
FERRITIN: 32 ng/mL (ref 15–150)
Folate: 7.2 ng/mL (ref >3.0)
HEMATOCRIT: 39.4 % (ref 34.0–46.6)
Hemoglobin: 13.3 g/dL (ref 11.1–15.9)
IRON: 53 ug/dL (ref 27–159)
Immature Grans (Abs): 0 10*3/uL (ref 0.0–0.1)
Immature Granulocytes: 0 %
Iron Saturation: 17 % (ref 15–55)
LYMPHS ABS: 2.3 10*3/uL (ref 0.7–3.1)
Lymphs: 34 %
MCH: 32.9 pg (ref 26.6–33.0)
MCHC: 33.8 g/dL (ref 31.5–35.7)
MCV: 98 fL — AB (ref 79–97)
Monocytes Absolute: 0.3 10*3/uL (ref 0.1–0.9)
Monocytes: 5 %
Neutrophils Absolute: 3.9 10*3/uL (ref 1.4–7.0)
Neutrophils: 59 %
Platelets: 306 10*3/uL (ref 150–450)
RBC: 4.04 x10E6/uL (ref 3.77–5.28)
RDW: 13.8 % (ref 12.3–15.4)
RETIC CT PCT: 1.1 % (ref 0.6–2.6)
Total Iron Binding Capacity: 321 ug/dL (ref 250–450)
UIBC: 268 ug/dL (ref 131–425)
VITAMIN B 12: 285 pg/mL (ref 232–1245)
WBC: 6.7 10*3/uL (ref 3.4–10.8)

## 2018-01-29 LAB — LIPID PANEL
CHOL/HDL RATIO: 3 ratio (ref 0.0–4.4)
Cholesterol, Total: 151 mg/dL (ref 100–199)
HDL: 50 mg/dL (ref >39)
LDL Calculated: 78 mg/dL (ref 0–99)
TRIGLYCERIDES: 116 mg/dL (ref 0–149)
VLDL Cholesterol Cal: 23 mg/dL (ref 5–40)

## 2018-02-12 ENCOUNTER — Encounter: Payer: Medicare Other | Attending: Physical Medicine & Rehabilitation

## 2018-02-12 ENCOUNTER — Encounter: Payer: Self-pay | Admitting: Physical Medicine & Rehabilitation

## 2018-02-12 ENCOUNTER — Ambulatory Visit (HOSPITAL_BASED_OUTPATIENT_CLINIC_OR_DEPARTMENT_OTHER): Payer: Medicare Other | Admitting: Physical Medicine & Rehabilitation

## 2018-02-12 VITALS — BP 146/100 | HR 76 | Resp 14

## 2018-02-12 DIAGNOSIS — Z801 Family history of malignant neoplasm of trachea, bronchus and lung: Secondary | ICD-10-CM | POA: Insufficient documentation

## 2018-02-12 DIAGNOSIS — G114 Hereditary spastic paraplegia: Secondary | ICD-10-CM | POA: Insufficient documentation

## 2018-02-12 DIAGNOSIS — Z823 Family history of stroke: Secondary | ICD-10-CM | POA: Diagnosis not present

## 2018-02-12 DIAGNOSIS — F17200 Nicotine dependence, unspecified, uncomplicated: Secondary | ICD-10-CM | POA: Insufficient documentation

## 2018-02-12 DIAGNOSIS — Z8249 Family history of ischemic heart disease and other diseases of the circulatory system: Secondary | ICD-10-CM | POA: Insufficient documentation

## 2018-02-12 DIAGNOSIS — Z9889 Other specified postprocedural states: Secondary | ICD-10-CM | POA: Diagnosis not present

## 2018-02-12 DIAGNOSIS — Z833 Family history of diabetes mellitus: Secondary | ICD-10-CM | POA: Insufficient documentation

## 2018-02-12 NOTE — Patient Instructions (Signed)
Call for problems such as increased tone in both legs

## 2018-02-12 NOTE — Progress Notes (Signed)
Subjective:    Patient ID: Tiffany Stewart, female    DOB: 1971/07/14, 47 y.o.   MRN: [id number] 47 year old female with hereditary spastic paraparesis HPI Still does not have the Knee orthosis, but did get measured, was told she would get a call but has not followed up on this Still transfers Mod I Having more difficulty getting up out of bed to chair  Better control of tone on Tizanidine 4mg  QID Baclofen 20mg  about every other day  Her main complaint remains difficulty with tone in both lower limbs.  More recently she has had problems with transfers.  She states that she can get her feet flat on the floor prior to transfers although this is difficult   Pain Inventory Average Pain 5 Pain Right Now 5 My pain is sharp, stabbing and aching  In the last 24 hours, has pain interfered with the following? General activity 0 Relation with others 0 Enjoyment of life 0 What TIME of day is your pain at its worst? all Sleep (in general) Fair  Pain is worse with: unsure Pain improves with: other Relief from Meds: 4  Mobility how many minutes can you walk? 0 ability to climb steps?  no do you drive?  no use a wheelchair  Function I need assistance with the following:  meal prep  Neuro/Psych trouble walking spasms  Prior Studies Any changes since last visit?  no  Physicians involved in your care Any changes since last visit?  no   Family History  Problem Relation Age of Onset  . Diabetes Father   . Cancer Father        lung cancer  . Heart disease Father   . Heart disease Maternal Grandmother   . Stroke Maternal Grandfather   . Diabetes Paternal Grandmother   . Colon cancer Neg Hx    Social History   Socioeconomic History  . Marital status: Single    Spouse name: Not on file  . Number of children: Not on file  . Years of education: Not on file  . Highest education level: Not on file  Occupational History  . Not on file  Social Needs  . Financial resource  strain: Not on file  . Food insecurity:    Worry: Not on file    Inability: Not on file  . Transportation needs:    Medical: Not on file    Non-medical: Not on file  Tobacco Use  . Smoking status: Current Every Day Smoker    Packs/day: 1.00    Years: 27.00    Pack years: 27.00    Types: Cigarettes  . Smokeless tobacco: Never Used  Substance and Sexual Activity  . Alcohol use: No  . Drug use: No  . Sexual activity: Yes    Birth control/protection: None  Lifestyle  . Physical activity:    Days per week: Not on file    Minutes per session: Not on file  . Stress: Not on file  Relationships  . Social connections:    Talks on phone: Not on file    Gets together: Not on file    Attends religious service: Not on file    Active member of club or organization: Not on file    Attends meetings of clubs or organizations: Not on file    Relationship status: Not on file  Other Topics Concern  . Not on file  Social History Narrative  . Not on file   Past Surgical History:  Procedure  Laterality Date  . CESAREAN SECTION    . CHOLECYSTECTOMY    . ENDOMETRIAL ABLATION  2000   Past Medical History:  Diagnosis Date  . Anxiety   . Chronic pain   . Depression   . GERD (gastroesophageal reflux disease)   . HSP (hereditary spastic paraplegia) ([place])   . Migraines    BP (!) 146/100 (BP Location: Right Arm, Patient Position: Sitting, Cuff Size: Normal)   Pulse 76   Resp 14   SpO2 97%   Opioid Risk Score:   Fall Risk Score:  `1  Depression screen PHQ 2/9  Depression screen [facility] 2/9 01/28/2018 11/02/2017 09/28/2017 06/04/2017 02/27/2017 12/01/2016 10/11/2016  Decreased Interest 0 0 3 1 0 0 0  Down, Depressed, Hopeless 0 0 1 2 0 0 0  PHQ - 2 Score 0 0 4 3 0 0 0  Altered sleeping - 0 2 1 - - -  Tired, decreased energy - 0 0 2 - - -  Change in appetite - 0 0 3 - - -  Feeling bad or failure about yourself  - 0 0 0 - - -  Trouble concentrating - 0 1 0 - - -  Moving slowly or  fidgety/restless - 0 0 0 - - -  Suicidal thoughts - 0 0 0 - - -  PHQ-9 Score - 0 7 9 - - -    Review of Systems  Constitutional: Negative.   HENT: Negative.   Eyes: Negative.   Respiratory: Negative.   Cardiovascular: Negative.   Gastrointestinal: Positive for abdominal pain.  Endocrine: Negative.   Genitourinary: Negative.   Musculoskeletal: Positive for gait problem and myalgias.       Spasms   Skin: Negative.   Allergic/Immunologic: Positive for immunocompromised state.  Hematological: Negative.   Psychiatric/Behavioral: Negative.        Objective:   Physical Exam  Constitutional: She is oriented to person, place, and time. She appears well-developed and well-nourished. No distress.  HENT:  Poor dentition  Eyes: Pupils are equal, round, and reactive to light. EOM are normal.  Musculoskeletal: She exhibits deformity.  Equina varus deformity bilateral foot and ankle area.  Neurological: She is alert and oriented to person, place, and time. She displays no atrophy. She exhibits abnormal muscle tone. Coordination and gait abnormal.  Motor strength is 5/5 bilateral deltoid, bicep, tricep, grip to minus hip flexion bilaterally 3- knee extension bilaterally inhibited by tone trace ankle dorsiflexion to minus ankle plantarflexion with increased foot inverter tone Patient is nonambulatory  Skin: Skin is warm and dry. She is not diaphoretic.  Nursing note and vitals reviewed.   Spasms and increased tone Bilateral foot inverters, hip adductors and knee flexors      Assessment & Plan:  1.  Hereditary spastic paraplegia.  Her tone is under fair control.  She still has significant lower extremity foot inverter tone and plantar flexor tone that is the most likely reason for difficulties with her transfers. Continue tizanidine 4 mg 4 times daily as well as baclofen 20 mg daily as needed. Patient would benefit from botulinum toxin injections however patient is apprehensive about  getting these. We discussed home health therapy and states that last time she had this it was not helpful in achieving any functional mobility goals. Physical medicine rehab follow-up in 6 months, patient will call in the meantime if she wishes to pursue any botulinum toxin injections.

## 2018-02-27 ENCOUNTER — Other Ambulatory Visit: Payer: Self-pay | Admitting: Nurse Practitioner

## 2018-02-27 ENCOUNTER — Telehealth: Payer: Self-pay | Admitting: Nurse Practitioner

## 2018-02-27 DIAGNOSIS — F411 Generalized anxiety disorder: Secondary | ICD-10-CM

## 2018-02-27 NOTE — Telephone Encounter (Signed)
lmtcb

## 2018-02-28 NOTE — Telephone Encounter (Signed)
Last seen 01/18/18  MMM

## 2018-03-16 ENCOUNTER — Encounter (HOSPITAL_COMMUNITY): Payer: Self-pay | Admitting: Emergency Medicine

## 2018-03-16 ENCOUNTER — Other Ambulatory Visit: Payer: Self-pay

## 2018-03-16 ENCOUNTER — Inpatient Hospital Stay (HOSPITAL_COMMUNITY)
Admission: EM | Admit: 2018-03-16 | Discharge: 2018-03-27 | DRG: 326 | Disposition: A | Discharge status: Home / Self Care | Payer: Medicare Other | Attending: General Surgery | Admitting: General Surgery

## 2018-03-16 DIAGNOSIS — F329 Major depressive disorder, single episode, unspecified: Secondary | ICD-10-CM | POA: Diagnosis present

## 2018-03-16 DIAGNOSIS — I4581 Long QT syndrome: Secondary | ICD-10-CM | POA: Diagnosis present

## 2018-03-16 DIAGNOSIS — T428X1A Poisoning by antiparkinsonism drugs and other central muscle-tone depressants, accidental (unintentional), initial encounter: Secondary | ICD-10-CM | POA: Diagnosis present

## 2018-03-16 DIAGNOSIS — Z5329 Procedure and treatment not carried out because of patient's decision for other reasons: Secondary | ICD-10-CM | POA: Diagnosis present

## 2018-03-16 DIAGNOSIS — Z9049 Acquired absence of other specified parts of digestive tract: Secondary | ICD-10-CM

## 2018-03-16 DIAGNOSIS — Z888 Allergy status to other drugs, medicaments and biological substances status: Secondary | ICD-10-CM

## 2018-03-16 DIAGNOSIS — K668 Other specified disorders of peritoneum: Secondary | ICD-10-CM | POA: Diagnosis not present

## 2018-03-16 DIAGNOSIS — M62838 Other muscle spasm: Secondary | ICD-10-CM | POA: Diagnosis present

## 2018-03-16 DIAGNOSIS — L89522 Pressure ulcer of left ankle, stage 2: Secondary | ICD-10-CM | POA: Diagnosis present

## 2018-03-16 DIAGNOSIS — Z833 Family history of diabetes mellitus: Secondary | ICD-10-CM

## 2018-03-16 DIAGNOSIS — F419 Anxiety disorder, unspecified: Secondary | ICD-10-CM | POA: Diagnosis present

## 2018-03-16 DIAGNOSIS — K255 Chronic or unspecified gastric ulcer with perforation: Secondary | ICD-10-CM | POA: Diagnosis not present

## 2018-03-16 DIAGNOSIS — Z801 Family history of malignant neoplasm of trachea, bronchus and lung: Secondary | ICD-10-CM

## 2018-03-16 DIAGNOSIS — L89622 Pressure ulcer of left heel, stage 2: Secondary | ICD-10-CM | POA: Diagnosis present

## 2018-03-16 DIAGNOSIS — Z8249 Family history of ischemic heart disease and other diseases of the circulatory system: Secondary | ICD-10-CM

## 2018-03-16 DIAGNOSIS — D72829 Elevated white blood cell count, unspecified: Secondary | ICD-10-CM | POA: Diagnosis present

## 2018-03-16 DIAGNOSIS — F17210 Nicotine dependence, cigarettes, uncomplicated: Secondary | ICD-10-CM | POA: Diagnosis present

## 2018-03-16 DIAGNOSIS — G114 Hereditary spastic paraplegia: Secondary | ICD-10-CM | POA: Diagnosis present

## 2018-03-16 DIAGNOSIS — Z79899 Other long term (current) drug therapy: Secondary | ICD-10-CM

## 2018-03-16 DIAGNOSIS — K659 Peritonitis, unspecified: Secondary | ICD-10-CM | POA: Diagnosis present

## 2018-03-16 DIAGNOSIS — G8929 Other chronic pain: Secondary | ICD-10-CM | POA: Diagnosis present

## 2018-03-16 DIAGNOSIS — R079 Chest pain, unspecified: Secondary | ICD-10-CM | POA: Diagnosis not present

## 2018-03-16 DIAGNOSIS — R109 Unspecified abdominal pain: Secondary | ICD-10-CM

## 2018-03-16 DIAGNOSIS — Y92009 Unspecified place in unspecified non-institutional (private) residence as the place of occurrence of the external cause: Secondary | ICD-10-CM

## 2018-03-16 DIAGNOSIS — K219 Gastro-esophageal reflux disease without esophagitis: Secondary | ICD-10-CM | POA: Diagnosis present

## 2018-03-16 DIAGNOSIS — D649 Anemia, unspecified: Secondary | ICD-10-CM | POA: Diagnosis present

## 2018-03-16 DIAGNOSIS — Z885 Allergy status to narcotic agent status: Secondary | ICD-10-CM

## 2018-03-16 DIAGNOSIS — I10 Essential (primary) hypertension: Secondary | ICD-10-CM | POA: Diagnosis present

## 2018-03-16 DIAGNOSIS — K275 Chronic or unspecified peptic ulcer, site unspecified, with perforation: Secondary | ICD-10-CM | POA: Diagnosis present

## 2018-03-16 DIAGNOSIS — Z823 Family history of stroke: Secondary | ICD-10-CM

## 2018-03-16 NOTE — ED Triage Notes (Signed)
Pt states she has taken 4 baclofen tonight. Pt was unresponsive upon arrival to the ED in the passenger seat of family members car. Pt denies SI. Pt alert but drowsy at this time.

## 2018-03-16 NOTE — ED Provider Notes (Signed)
[facility] EMERGENCY DEPARTMENT Provider Note   CSN: [id number] Arrival date & time: 03/16/18  2203     History   Chief Complaint Chief Complaint  Patient presents with  . Drug Overdose    HPI Tiffany Stewart is a 47 y.o. female presenting for altered mental status.  Per family friend at bedside patient has been more drowsy today and family struggled to wake her up to put her in the car.  Patient is difficult to arouse and when asked why she is drowsy she states that she has taken too many of her baclofen.  Patient states that she took more baclofen than prescribed because she is having pain in her left side.  Patient would not elaborate on her pain or symptoms and would repeatedly fall asleep.   Patient with a history of rotatory spastic paraplegia as well as chronic pain.  The patient's chart which shows an admission on 02/08/2017 for unresponsiveness.  During this encounter the patient she was diagnosed with baclofen overdose.  Level 5 caveat for altered mental status.  HPI  Past Medical History:  Diagnosis Date  . Anxiety   . Chronic pain   . Depression   . GERD (gastroesophageal reflux disease)   . HSP (hereditary spastic paraplegia) ([place])   . Migraines     Patient Active Problem List   Diagnosis Date Noted  . Irritable bowel syndrome 06/04/2017  . Baclofen overdose 02/09/2017  . Protein-calorie malnutrition, severe 11/15/2016  . Iron deficiency anemia 11/13/2016  . HSP (hereditary spastic paraplegia) ([place]) 04/27/2014  . TOBACCO ABUSE 11/11/2009  . Anxiety state 03/06/2008  . GERD 03/06/2008    Past Surgical History:  Procedure Laterality Date  . CESAREAN SECTION    . CHOLECYSTECTOMY    . ENDOMETRIAL ABLATION  2000     OB History   None      Home Medications    Prior to Admission medications   Medication Sig Start Date End Date Taking? Authorizing Provider  acetaminophen (TYLENOL) 500 MG tablet Take 1,500 mg by mouth every 6 (six) hours as needed for  moderate pain.    [provider]  ALPRAZolam (XANAX) 0.5 MG tablet TAKE 1 TABLET BY MOUTH TWICE DAILY AS NEEDED FOR ANXIETY. 02/28/18   Sharion Balloon, FNP  baclofen (LIORESAL) 20 MG tablet TAKE 1 TABLET BY MOUTH TWICE DAILY 01/28/18   Hassell Done, Mary-Margaret, FNP  escitalopram (LEXAPRO) 10 MG tablet Take 1 tablet (10 mg total) by mouth daily. 01/28/18   Hassell Done Mary-Margaret, FNP  feeding supplement, ENSURE ENLIVE, (ENSURE ENLIVE) LIQD Take 237 mLs by mouth 2 (two) times daily between meals. 05/03/17   Debbe Odea, MD  pantoprazole (PROTONIX) 40 MG tablet Take 1 tablet (40 mg total) by mouth 2 (two) times daily before a meal. 01/28/18   Hassell Done, Mary-Margaret, FNP  tiZANidine (ZANAFLEX) 4 MG tablet TAKE 1 TABLET EVERY 6 HOURS AS NEEDED FOR MUSCLE SPASMS 01/28/18   Chevis Pretty, FNP    Family History Family History  Problem Relation Age of Onset  . Diabetes Father   . Cancer Father        lung cancer  . Heart disease Father   . Heart disease Maternal Grandmother   . Stroke Maternal Grandfather   . Diabetes Paternal Grandmother   . Colon cancer Neg Hx     Social History Social History   Tobacco Use  . Smoking status: Current Every Day Smoker    Packs/day: 1.00    Years: 27.00  Pack years: 27.00    Types: Cigarettes  . Smokeless tobacco: Never Used  Substance Use Topics  . Alcohol use: No  . Drug use: No     Allergies   Hctz [hydrochlorothiazide]; Morphine; and Zoloft [sertraline hcl]   Review of Systems Review of Systems  Unable to perform ROS: Mental status change  Level 5 caveat for Mental Status Change   Physical Exam Updated Vital Signs BP (!) 150/123   Pulse (!) 101   Temp 99.7 F (37.6 C) (Rectal)   Resp (!) 25   Wt 47.6 kg (105 lb)   SpO2 93%   BMI 19.84 kg/m   Physical Exam  Constitutional: She appears lethargic. She appears ill.  HENT:  Head: Normocephalic and atraumatic. Head is without raccoon's eyes and without Battle's sign.    Right Ear: External ear normal.  Left Ear: External ear normal.  Nose: Nose normal.  Patient uncooperative with examination, laying on her right side, unable to evaluate oropharynx at this time.  No stridor, normal phonation when patient does speak between episodes of sleeping.  Eyes: Pupils are equal, round, and reactive to light. Conjunctivae and EOM are normal.  Neck: Trachea normal and phonation normal. Neck supple. No spinous process tenderness present.  Cardiovascular: Regular rhythm, normal heart sounds, intact distal pulses and normal pulses. Tachycardia present.  Pulmonary/Chest: Effort normal and breath sounds normal. She has no wheezes.  Abdominal: Soft. Normal appearance. There is no tenderness. There is no rigidity, no rebound and no guarding.  Difficulty with assessing the patient's abdomen, she has lower extremity contractures.  Abdomen was soft, patient did not cry out during examination, no rigidity or guarding.  Musculoskeletal:       Back:  Patient with spastic/contracted lower extremities, unable to have patient straighten her legs.  Neurological: She appears lethargic.  Skin: Skin is warm and dry. Capillary refill takes less than 2 seconds.    ED Treatments / Results  Labs (all labs ordered are listed, but only abnormal results are displayed) Labs Reviewed  COMPREHENSIVE METABOLIC PANEL - Abnormal; Notable for the following components:      Result Value   Potassium 3.4 (*)    Glucose, Bld 123 (*)    BUN 45 (*)    All other components within normal limits  ACETAMINOPHEN LEVEL - Abnormal; Notable for the following components:   Acetaminophen (Tylenol), Serum <10 (*)    All other components within normal limits  CBC WITH DIFFERENTIAL/PLATELET - Abnormal; Notable for the following components:   WBC 14.2 (*)    Platelets 409 (*)    Neutro Abs 12.3 (*)    All other components within normal limits  URINE CULTURE  SALICYLATE LEVEL  ETHANOL  HCG, SERUM,  QUALITATIVE  RAPID URINE DRUG SCREEN, HOSP PERFORMED  URINALYSIS, ROUTINE W REFLEX MICROSCOPIC  CBG MONITORING, ED    EKG EKG Interpretation  Date/Time:  Saturday March 16 2018 22:57:06 EDT Ventricular Rate:  102 PR Interval:    QRS Duration: 94 QT Interval:  396 QTC Calculation: 519 R Axis:   49 Text Interpretation:  Sinus tachycardia Borderline T abnormalities, anterior leads Prolonged QT interval Interpretation limited secondary to artifact Confirmed by Ripley Fraise [phone]) on 03/17/2018 12:23:19 AM   Radiology No results found.  Procedures Procedures (including critical care time)  Medications Ordered in ED Medications - No data to display   Initial Impression / Assessment and Plan / ED Course  I have reviewed the triage vital signs and the  nursing notes.  Pertinent labs & imaging results that were available during my care of the patient were reviewed by me and considered in my medical decision making (see chart for details).    Lethargic patient presenting following alleged baclofen overdose.  Patient also endorsing left sided pain between her ribs and her hip.  ED overdose order set in addition to UA and urine culture begun.  Patient with admission one year ago for similar circumstances.   Patient was also seen and examined by Dr. Christy Gentles who has added a CT of the patient's abdomen looking for intra-abdominal pathology.  Labs and imaging pending at this time.  Handoff given to Dr. Christy Gentles at shift-change.  Final Clinical Impressions(s) / ED Diagnoses   Final diagnoses:  None    ED Discharge Orders    None       Gari Crown 03/17/18 0149    Ripley Fraise, MD 03/17/18 Rogene [place]

## 2018-03-17 ENCOUNTER — Emergency Department (HOSPITAL_COMMUNITY): Payer: Medicare Other | Admitting: Anesthesiology

## 2018-03-17 ENCOUNTER — Emergency Department (HOSPITAL_COMMUNITY): Payer: Medicare Other

## 2018-03-17 ENCOUNTER — Encounter (HOSPITAL_COMMUNITY): Payer: Self-pay

## 2018-03-17 ENCOUNTER — Encounter (HOSPITAL_COMMUNITY)
Admission: EM | Disposition: A | Discharge status: Home / Self Care | Payer: Self-pay | Source: Home / Self Care | Attending: General Surgery

## 2018-03-17 DIAGNOSIS — Z8249 Family history of ischemic heart disease and other diseases of the circulatory system: Secondary | ICD-10-CM | POA: Diagnosis not present

## 2018-03-17 DIAGNOSIS — M62838 Other muscle spasm: Secondary | ICD-10-CM | POA: Diagnosis present

## 2018-03-17 DIAGNOSIS — F419 Anxiety disorder, unspecified: Secondary | ICD-10-CM | POA: Diagnosis present

## 2018-03-17 DIAGNOSIS — G114 Hereditary spastic paraplegia: Secondary | ICD-10-CM | POA: Diagnosis present

## 2018-03-17 DIAGNOSIS — K275 Chronic or unspecified peptic ulcer, site unspecified, with perforation: Secondary | ICD-10-CM

## 2018-03-17 DIAGNOSIS — Z5329 Procedure and treatment not carried out because of patient's decision for other reasons: Secondary | ICD-10-CM | POA: Diagnosis present

## 2018-03-17 DIAGNOSIS — Z801 Family history of malignant neoplasm of trachea, bronchus and lung: Secondary | ICD-10-CM | POA: Diagnosis not present

## 2018-03-17 DIAGNOSIS — Y92009 Unspecified place in unspecified non-institutional (private) residence as the place of occurrence of the external cause: Secondary | ICD-10-CM | POA: Diagnosis not present

## 2018-03-17 DIAGNOSIS — Z885 Allergy status to narcotic agent status: Secondary | ICD-10-CM | POA: Diagnosis not present

## 2018-03-17 DIAGNOSIS — F329 Major depressive disorder, single episode, unspecified: Secondary | ICD-10-CM | POA: Diagnosis present

## 2018-03-17 DIAGNOSIS — D72829 Elevated white blood cell count, unspecified: Secondary | ICD-10-CM | POA: Diagnosis present

## 2018-03-17 DIAGNOSIS — I4581 Long QT syndrome: Secondary | ICD-10-CM | POA: Diagnosis present

## 2018-03-17 DIAGNOSIS — I10 Essential (primary) hypertension: Secondary | ICD-10-CM | POA: Diagnosis present

## 2018-03-17 DIAGNOSIS — Z888 Allergy status to other drugs, medicaments and biological substances status: Secondary | ICD-10-CM | POA: Diagnosis not present

## 2018-03-17 DIAGNOSIS — K668 Other specified disorders of peritoneum: Secondary | ICD-10-CM | POA: Diagnosis present

## 2018-03-17 DIAGNOSIS — Z833 Family history of diabetes mellitus: Secondary | ICD-10-CM | POA: Diagnosis not present

## 2018-03-17 DIAGNOSIS — K219 Gastro-esophageal reflux disease without esophagitis: Secondary | ICD-10-CM | POA: Diagnosis present

## 2018-03-17 DIAGNOSIS — G8929 Other chronic pain: Secondary | ICD-10-CM | POA: Diagnosis present

## 2018-03-17 DIAGNOSIS — Z79899 Other long term (current) drug therapy: Secondary | ICD-10-CM | POA: Diagnosis not present

## 2018-03-17 DIAGNOSIS — T428X1A Poisoning by antiparkinsonism drugs and other central muscle-tone depressants, accidental (unintentional), initial encounter: Secondary | ICD-10-CM | POA: Diagnosis present

## 2018-03-17 DIAGNOSIS — D649 Anemia, unspecified: Secondary | ICD-10-CM | POA: Diagnosis present

## 2018-03-17 DIAGNOSIS — Z9049 Acquired absence of other specified parts of digestive tract: Secondary | ICD-10-CM | POA: Diagnosis not present

## 2018-03-17 DIAGNOSIS — K255 Chronic or unspecified gastric ulcer with perforation: Secondary | ICD-10-CM | POA: Diagnosis present

## 2018-03-17 DIAGNOSIS — Z823 Family history of stroke: Secondary | ICD-10-CM | POA: Diagnosis not present

## 2018-03-17 DIAGNOSIS — K659 Peritonitis, unspecified: Secondary | ICD-10-CM | POA: Diagnosis present

## 2018-03-17 DIAGNOSIS — F17210 Nicotine dependence, cigarettes, uncomplicated: Secondary | ICD-10-CM | POA: Diagnosis present

## 2018-03-17 DIAGNOSIS — R079 Chest pain, unspecified: Secondary | ICD-10-CM | POA: Diagnosis not present

## 2018-03-17 HISTORY — DX: Chronic or unspecified peptic ulcer, site unspecified, with perforation: K27.5

## 2018-03-17 HISTORY — PX: GASTRORRHAPHY: SHX6263

## 2018-03-17 HISTORY — PX: LAPAROTOMY: SHX154

## 2018-03-17 LAB — COMPREHENSIVE METABOLIC PANEL
ALBUMIN: 4 g/dL (ref 3.5–5.0)
ALT: 11 U/L (ref 0–44)
AST: 16 U/L (ref 15–41)
Alkaline Phosphatase: 54 U/L (ref 38–126)
Anion gap: 11 (ref 5–15)
BUN: 45 mg/dL — AB (ref 6–20)
CHLORIDE: 105 mmol/L (ref 98–111)
CO2: 23 mmol/L (ref 22–32)
CREATININE: 0.71 mg/dL (ref 0.44–1.00)
Calcium: 9.4 mg/dL (ref 8.9–10.3)
GFR calc Af Amer: >60 mL/min (ref >60)
GFR calc non Af Amer: >60 mL/min (ref >60)
Glucose, Bld: 123 mg/dL — ABNORMAL HIGH (ref 70–99)
Potassium: 3.4 mmol/L — ABNORMAL LOW (ref 3.5–5.1)
SODIUM: 139 mmol/L (ref 135–145)
Total Bilirubin: 0.6 mg/dL (ref 0.3–1.2)
Total Protein: 8.1 g/dL (ref 6.5–8.1)

## 2018-03-17 LAB — CBC WITH DIFFERENTIAL/PLATELET
Basophils Absolute: 0 10*3/uL (ref 0.0–0.1)
Basophils Relative: 0 %
Eosinophils Absolute: 0 10*3/uL (ref 0.0–0.7)
Eosinophils Relative: 0 %
HEMATOCRIT: 43.2 % (ref 36.0–46.0)
HEMOGLOBIN: 14.7 g/dL (ref 12.0–15.0)
LYMPHS ABS: 1.1 10*3/uL (ref 0.7–4.0)
Lymphocytes Relative: 8 %
MCH: 33.4 pg (ref 26.0–34.0)
MCHC: 34 g/dL (ref 30.0–36.0)
MCV: 98.2 fL (ref 78.0–100.0)
MONOS PCT: 5 %
Monocytes Absolute: 0.8 10*3/uL (ref 0.1–1.0)
NEUTROS ABS: 12.3 10*3/uL — AB (ref 1.7–7.7)
NEUTROS PCT: 87 %
Platelets: 409 10*3/uL — ABNORMAL HIGH (ref 150–400)
RBC: 4.4 MIL/uL (ref 3.87–5.11)
RDW: 13.5 % (ref 11.5–15.5)
WBC: 14.2 10*3/uL — ABNORMAL HIGH (ref 4.0–10.5)

## 2018-03-17 LAB — ETHANOL: Alcohol, Ethyl (B): <10 mg/dL (ref <10)

## 2018-03-17 LAB — ACETAMINOPHEN LEVEL: Acetaminophen (Tylenol), Serum: <10 ug/mL — ABNORMAL LOW (ref 10–30)

## 2018-03-17 LAB — HCG, SERUM, QUALITATIVE: Preg, Serum: NEGATIVE

## 2018-03-17 LAB — SALICYLATE LEVEL

## 2018-03-17 LAB — CBG MONITORING, ED: Glucose-Capillary: 119 mg/dL — ABNORMAL HIGH (ref 70–99)

## 2018-03-17 SURGERY — LAPAROTOMY, EXPLORATORY
Anesthesia: General

## 2018-03-17 MED ORDER — SUGAMMADEX SODIUM 200 MG/2ML IV SOLN
INTRAVENOUS | Status: DC | PRN
  Administered 2018-03-17: 200 mg via INTRAVENOUS

## 2018-03-17 MED ORDER — CHLORHEXIDINE GLUCONATE CLOTH 2 % EX PADS: 6.0000 | MEDICATED_PAD | Freq: Once | CUTANEOUS | Status: DC

## 2018-03-17 MED ORDER — ROCURONIUM BROMIDE 100 MG/10ML IV SOLN
INTRAVENOUS | Status: DC | PRN
  Administered 2018-03-17: 25 mg via INTRAVENOUS
  Administered 2018-03-17: 15 mg via INTRAVENOUS

## 2018-03-17 MED ORDER — PROPOFOL 10 MG/ML IV BOLUS
INTRAVENOUS | Status: DC | PRN
  Administered 2018-03-17: 120 mg via INTRAVENOUS

## 2018-03-17 MED ORDER — ORAL CARE MOUTH RINSE
15.0000 mL | Freq: Two times a day (BID) | OROMUCOSAL | Status: DC
  Administered 2018-03-18 – 2018-03-25 (×13): 15 mL via OROMUCOSAL

## 2018-03-17 MED ORDER — ACETAMINOPHEN 650 MG RE SUPP: 650.0000 mg | Freq: Four times a day (QID) | RECTAL | Status: DC | PRN

## 2018-03-17 MED ORDER — ACETAMINOPHEN 325 MG PO TABS
650.0000 mg | ORAL_TABLET | Freq: Four times a day (QID) | ORAL | Status: DC | PRN
  Administered 2018-03-18: 650 mg via ORAL
  Filled 2018-03-17: qty 2

## 2018-03-17 MED ORDER — FENTANYL CITRATE (PF) 100 MCG/2ML IJ SOLN
50.0000 ug | INTRAMUSCULAR | Status: DC | PRN
  Administered 2018-03-17 – 2018-03-27 (×39): 50 ug via INTRAVENOUS
  Filled 2018-03-17 (×40): qty 2

## 2018-03-17 MED ORDER — ALUM & MAG HYDROXIDE-SIMETH 200-200-20 MG/5ML PO SUSP
30.0000 mL | ORAL | Status: DC | PRN
  Administered 2018-03-17 – 2018-03-18 (×3): 30 mL via ORAL
  Filled 2018-03-17 (×3): qty 30

## 2018-03-17 MED ORDER — SODIUM CHLORIDE 0.9 % IV SOLN
INTRAVENOUS | Status: DC | PRN
  Administered 2018-03-17: 06:00:00 via INTRAVENOUS

## 2018-03-17 MED ORDER — ONDANSETRON 4 MG PO TBDP: 4.0000 mg | ORAL_TABLET | Freq: Four times a day (QID) | ORAL | Status: DC | PRN

## 2018-03-17 MED ORDER — KETOROLAC TROMETHAMINE 30 MG/ML IJ SOLN
INTRAMUSCULAR | Status: AC
  Filled 2018-03-17: qty 1

## 2018-03-17 MED ORDER — KETOROLAC TROMETHAMINE 30 MG/ML IJ SOLN
30.0000 mg | Freq: Four times a day (QID) | INTRAMUSCULAR | Status: AC
  Administered 2018-03-17: 30 mg via INTRAVENOUS
  Filled 2018-03-17: qty 1

## 2018-03-17 MED ORDER — SUGAMMADEX SODIUM 500 MG/5ML IV SOLN
INTRAVENOUS | Status: AC
  Filled 2018-03-17: qty 5

## 2018-03-17 MED ORDER — MEPERIDINE HCL 50 MG/ML IJ SOLN
6.2500 mg | INTRAMUSCULAR | Status: DC | PRN
Start: 2018-03-17 — End: 2018-03-17

## 2018-03-17 MED ORDER — GLYCOPYRROLATE 0.2 MG/ML IJ SOLN
INTRAMUSCULAR | Status: AC
  Filled 2018-03-17: qty 1

## 2018-03-17 MED ORDER — PIPERACILLIN-TAZOBACTAM 3.375 G IVPB
3.3750 g | Freq: Three times a day (TID) | INTRAVENOUS | Status: DC
  Administered 2018-03-17 – 2018-03-26 (×27): 3.375 g via INTRAVENOUS
  Filled 2018-03-17 (×25): qty 50

## 2018-03-17 MED ORDER — LACTATED RINGERS IV SOLN
INTRAVENOUS | Status: DC | PRN
  Administered 2018-03-17: 07:00:00 via INTRAVENOUS

## 2018-03-17 MED ORDER — SODIUM CHLORIDE 0.9 % IR SOLN
Status: DC | PRN
  Administered 2018-03-17 (×2): 1000 mL

## 2018-03-17 MED ORDER — SODIUM CHLORIDE 0.9 % IV BOLUS (SEPSIS)
1000.0000 mL | Freq: Once | INTRAVENOUS | Status: AC
  Administered 2018-03-17: 1000 mL via INTRAVENOUS

## 2018-03-17 MED ORDER — HYDROCODONE-ACETAMINOPHEN 7.5-325 MG PO TABS: 1.0000 | ORAL_TABLET | Freq: Once | ORAL | Status: DC | PRN

## 2018-03-17 MED ORDER — LACTATED RINGERS IV SOLN: INTRAVENOUS | Status: DC

## 2018-03-17 MED ORDER — FENTANYL CITRATE (PF) 100 MCG/2ML IJ SOLN
INTRAMUSCULAR | Status: AC
  Filled 2018-03-17: qty 2

## 2018-03-17 MED ORDER — ENOXAPARIN SODIUM 40 MG/0.4ML ~~LOC~~ SOLN
40.0000 mg | SUBCUTANEOUS | Status: DC
  Administered 2018-03-18 – 2018-03-27 (×10): 40 mg via SUBCUTANEOUS
  Filled 2018-03-17 (×10): qty 0.4

## 2018-03-17 MED ORDER — PIPERACILLIN-TAZOBACTAM 3.375 G IVPB 30 MIN
3.3750 g | Freq: Once | INTRAVENOUS | Status: AC
  Administered 2018-03-17: 3.375 g via INTRAVENOUS
  Filled 2018-03-17: qty 50

## 2018-03-17 MED ORDER — ENALAPRILAT 1.25 MG/ML IV SOLN
1.2500 mg | Freq: Four times a day (QID) | INTRAVENOUS | Status: DC | PRN
  Administered 2018-03-20: 1.25 mg via INTRAVENOUS
  Filled 2018-03-17: qty 2

## 2018-03-17 MED ORDER — MIDAZOLAM HCL 2 MG/2ML IJ SOLN
INTRAMUSCULAR | Status: AC
  Filled 2018-03-17: qty 2

## 2018-03-17 MED ORDER — HYDROMORPHONE HCL 1 MG/ML IJ SOLN
INTRAMUSCULAR | Status: AC
  Filled 2018-03-17: qty 0.5

## 2018-03-17 MED ORDER — FENTANYL CITRATE (PF) 100 MCG/2ML IJ SOLN
100.0000 ug | Freq: Once | INTRAMUSCULAR | Status: AC
  Administered 2018-03-17: 100 ug via INTRAVENOUS
  Filled 2018-03-17: qty 2

## 2018-03-17 MED ORDER — NEOSTIGMINE METHYLSULFATE 10 MG/10ML IV SOLN
INTRAVENOUS | Status: AC
  Filled 2018-03-17: qty 1

## 2018-03-17 MED ORDER — LORAZEPAM 2 MG/ML IJ SOLN
1.0000 mg | INTRAMUSCULAR | Status: DC | PRN
  Administered 2018-03-17 (×2): 1 mg via INTRAVENOUS
  Filled 2018-03-17 (×2): qty 1

## 2018-03-17 MED ORDER — POVIDONE-IODINE 10 % EX OINT
TOPICAL_OINTMENT | CUTANEOUS | Status: AC
  Filled 2018-03-17: qty 1

## 2018-03-17 MED ORDER — ENALAPRILAT 1.25 MG/ML IV SOLN
1.2500 mg | Freq: Four times a day (QID) | INTRAVENOUS | Status: DC
  Administered 2018-03-17: 1.25 mg via INTRAVENOUS
  Filled 2018-03-17: qty 2

## 2018-03-17 MED ORDER — FAMOTIDINE IN NACL 20-0.9 MG/50ML-% IV SOLN
20.0000 mg | Freq: Two times a day (BID) | INTRAVENOUS | Status: DC
  Administered 2018-03-17 – 2018-03-19 (×5): 20 mg via INTRAVENOUS
  Filled 2018-03-17 (×5): qty 50

## 2018-03-17 MED ORDER — ONDANSETRON HCL 4 MG/2ML IJ SOLN
4.0000 mg | Freq: Four times a day (QID) | INTRAMUSCULAR | Status: DC | PRN
  Administered 2018-03-17 – 2018-03-19 (×3): 4 mg via INTRAVENOUS
  Filled 2018-03-17 (×3): qty 2

## 2018-03-17 MED ORDER — CHLORHEXIDINE GLUCONATE 0.12 % MT SOLN
15.0000 mL | Freq: Two times a day (BID) | OROMUCOSAL | Status: DC
  Administered 2018-03-17 – 2018-03-27 (×20): 15 mL via OROMUCOSAL
  Filled 2018-03-17 (×20): qty 15

## 2018-03-17 MED ORDER — MIDAZOLAM HCL 2 MG/2ML IJ SOLN
INTRAMUSCULAR | Status: DC | PRN
  Administered 2018-03-17: 2 mg via INTRAVENOUS

## 2018-03-17 MED ORDER — BUPIVACAINE LIPOSOME 1.3 % IJ SUSP
INTRAMUSCULAR | Status: AC
  Filled 2018-03-17: qty 20

## 2018-03-17 MED ORDER — KETOROLAC TROMETHAMINE 30 MG/ML IJ SOLN
30.0000 mg | Freq: Four times a day (QID) | INTRAMUSCULAR | Status: AC | PRN
  Administered 2018-03-19 – 2018-03-21 (×3): 30 mg via INTRAVENOUS
  Filled 2018-03-17 (×3): qty 1

## 2018-03-17 MED ORDER — HYDROMORPHONE HCL 1 MG/ML IJ SOLN
0.2500 mg | INTRAMUSCULAR | Status: DC | PRN
  Administered 2018-03-17 (×2): 0.5 mg via INTRAVENOUS
  Filled 2018-03-17: qty 0.5

## 2018-03-17 MED ORDER — BUPIVACAINE LIPOSOME 1.3 % IJ SUSP
INTRAMUSCULAR | Status: DC | PRN
  Administered 2018-03-17: 20 mL

## 2018-03-17 MED ORDER — FENTANYL CITRATE (PF) 100 MCG/2ML IJ SOLN
INTRAMUSCULAR | Status: DC | PRN
  Administered 2018-03-17: 50 ug via INTRAVENOUS
  Administered 2018-03-17: 25 ug via INTRAVENOUS
  Administered 2018-03-17: 100 ug via INTRAVENOUS

## 2018-03-17 MED ORDER — SUCCINYLCHOLINE CHLORIDE 20 MG/ML IJ SOLN
INTRAMUSCULAR | Status: DC | PRN
  Administered 2018-03-17: 120 mg via INTRAVENOUS

## 2018-03-17 MED ORDER — PROMETHAZINE HCL 25 MG/ML IJ SOLN: 6.2500 mg | INTRAMUSCULAR | Status: DC | PRN

## 2018-03-17 MED ORDER — SODIUM CHLORIDE 0.9 % IV SOLN
INTRAVENOUS | Status: DC
  Administered 2018-03-17 – 2018-03-18 (×3): via INTRAVENOUS

## 2018-03-17 SURGICAL SUPPLY — 71 items
APPLIER CLIP 11 MED OPEN (CLIP)
APPLIER CLIP 13 LRG OPEN (CLIP)
APR CLP LRG 13 20 CLIP (CLIP)
APR CLP MED 11 20 MLT OPN (CLIP)
BARRIER SKIN 2 3/4 (OSTOMY) IMPLANT
BARRIER SKIN OD2.25 2 3/4 FLNG (OSTOMY) IMPLANT
BRR SKN FLT 2.75X2.25 2 PC (OSTOMY)
CELLS DAT CNTRL 66122 CELL SVR (MISCELLANEOUS) ×2 IMPLANT
CHLORAPREP W/TINT 26ML (MISCELLANEOUS) ×3 IMPLANT
CLAMP POUCH DRAINAGE QUIET (OSTOMY) IMPLANT
CLIP APPLIE 11 MED OPEN (CLIP) IMPLANT
CLIP APPLIE 13 LRG OPEN (CLIP) IMPLANT
CLOTH BEACON ORANGE TIMEOUT ST (SAFETY) ×3 IMPLANT
COVER LIGHT HANDLE STERIS (MISCELLANEOUS) ×6 IMPLANT
DRAPE WARM FLUID 44X44 (DRAPE) ×3 IMPLANT
DRSG OPSITE POSTOP 4X10 (GAUZE/BANDAGES/DRESSINGS) ×3 IMPLANT
DRSG OPSITE POSTOP 4X8 (GAUZE/BANDAGES/DRESSINGS) ×1 IMPLANT
ELECT BLADE 6 FLAT ULTRCLN (ELECTRODE) IMPLANT
ELECT REM PT RETURN 9FT ADLT (ELECTROSURGICAL) ×3
ELECTRODE REM PT RTRN 9FT ADLT (ELECTROSURGICAL) ×2 IMPLANT
GAUZE SPONGE 4X4 12PLY STRL (GAUZE/BANDAGES/DRESSINGS) ×3 IMPLANT
GLOVE BIOGEL M 6.5 STRL (GLOVE) ×2 IMPLANT
GLOVE BIOGEL PI IND STRL 6.5 (GLOVE) IMPLANT
GLOVE BIOGEL PI IND STRL 7.0 (GLOVE) ×4 IMPLANT
GLOVE BIOGEL PI IND STRL 7.5 (GLOVE) IMPLANT
GLOVE BIOGEL PI INDICATOR 6.5 (GLOVE) ×2
GLOVE BIOGEL PI INDICATOR 7.0 (GLOVE) ×2
GLOVE BIOGEL PI INDICATOR 7.5 (GLOVE) ×2
GLOVE SURG SS PI 7.5 STRL IVOR (GLOVE) ×3 IMPLANT
GOWN STRL REUS W/TWL LRG LVL3 (GOWN DISPOSABLE) ×9 IMPLANT
HANDLE SUCTION POOLE (INSTRUMENTS) ×2 IMPLANT
INST SET MAJOR GENERAL (KITS) ×3 IMPLANT
KIT REMOVER STAPLE SKIN (MISCELLANEOUS) IMPLANT
KIT TURNOVER KIT A (KITS) ×3 IMPLANT
LIGASURE IMPACT 36 18CM CVD LR (INSTRUMENTS) ×3 IMPLANT
MANIFOLD NEPTUNE II (INSTRUMENTS) ×3 IMPLANT
NDL HYPO 18GX1.5 BLUNT FILL (NEEDLE) ×2 IMPLANT
NEEDLE HYPO 18GX1.5 BLUNT FILL (NEEDLE) ×3 IMPLANT
NS IRRIG 1000ML POUR BTL (IV SOLUTION) ×6 IMPLANT
PACK ABDOMINAL MAJOR (CUSTOM PROCEDURE TRAY) ×3 IMPLANT
PAD ARMBOARD 7.5X6 YLW CONV (MISCELLANEOUS) ×3 IMPLANT
POUCH OSTOMY 2 3/4  H 3804 (WOUND CARE)
POUCH OSTOMY 2 3/4 H 3804 (WOUND CARE)
POUCH OSTOMY 2 PC DRNBL 2.75 (WOUND CARE) IMPLANT
RELOAD LINEAR CUT PROX 55 BLUE (ENDOMECHANICALS) IMPLANT
RELOAD PROXIMATE 75MM BLUE (ENDOMECHANICALS) IMPLANT
RELOAD STAPLE 55 3.8 BLU REG (ENDOMECHANICALS) IMPLANT
RELOAD STAPLE 75 3.8 BLU REG (ENDOMECHANICALS) IMPLANT
RETRACTOR WND ALEXIS 18 MED (MISCELLANEOUS) ×2 IMPLANT
RETRACTOR WND ALEXIS 25 LRG (MISCELLANEOUS) IMPLANT
RTRCTR WOUND ALEXIS 18CM MED (MISCELLANEOUS) ×3
RTRCTR WOUND ALEXIS 25CM LRG (MISCELLANEOUS)
SET BASIN LINEN APH (SET/KITS/TRAYS/PACK) ×3 IMPLANT
SPONGE LAP 18X18 X RAY DECT (DISPOSABLE) ×3 IMPLANT
STAPLER GUN LINEAR PROX 60 (STAPLE) IMPLANT
STAPLER PROXIMATE 55 BLUE (STAPLE) IMPLANT
STAPLER PROXIMATE 75MM BLUE (STAPLE) IMPLANT
STAPLER VISISTAT (STAPLE) ×3 IMPLANT
SUCTION POOLE HANDLE (INSTRUMENTS) ×3
SUT CHROMIC 0 SH (SUTURE) IMPLANT
SUT CHROMIC 2 0 SH (SUTURE) IMPLANT
SUT CHROMIC 3 0 SH 27 (SUTURE) ×3 IMPLANT
SUT NOVA NAB GS-26 0 60 (SUTURE) ×6 IMPLANT
SUT PDS AB 0 CTX 60 (SUTURE) IMPLANT
SUT PDS AB CT VIOLET #0 27IN (SUTURE) ×2 IMPLANT
SUT PROLENE 2 0 SH 30 (SUTURE) ×3 IMPLANT
SUT SILK 2 0 (SUTURE) ×3
SUT SILK 2-0 18XBRD TIE 12 (SUTURE) ×2 IMPLANT
SUT SILK 3 0 SH CR/8 (SUTURE) ×1 IMPLANT
SYR 20CC LL (SYRINGE) ×3 IMPLANT
TRAY FOLEY CATH SILVER 16FR (SET/KITS/TRAYS/PACK) ×2 IMPLANT

## 2018-03-17 NOTE — Anesthesia Procedure Notes (Signed)
Procedure Name: Intubation Performed by: Vena Rua, MD Pre-anesthesia Checklist: Patient identified, Emergency Drugs available, Suction available, Patient being monitored and Timeout performed Patient Re-evaluated:Patient Re-evaluated prior to induction Preoxygenation: Pre-oxygenation with 100% oxygen Induction Type: IV induction, Cricoid Pressure applied and Rapid sequence Laryngoscope Size: 3 and Mac Grade View: Grade I Tube size: 7.0 mm Placement Confirmation: ETT inserted through vocal cords under direct vision,  positive ETCO2 and breath sounds checked- equal and bilateral Secured at: 20 cm Tube secured with: Tape

## 2018-03-17 NOTE — Op Note (Signed)
Patient:  Tiffany Stewart  DOB:  1970-09-22  MRN:  [id number]   Preop Diagnosis: Peritonitis, perforated viscus  Postop Diagnosis: Perforated peptic ulcer  Procedure: Exploratory laparotomy, gastrorrhaphy  Surgeon: Aviva Signs, MD  Anes: General endotracheal  Indications: Patient is a 47 year old white female with multiple medical problems who has been taking Goody powders for migraine headaches and presents with acute onset of abdominal pain.  CT scan of the abdomen reveals pneumoperitoneum consistent with perforated viscus.  She does have peritoneal signs on physical examination.  The risks and benefits of the procedure including bleeding, infection, and the possibility of cardiopulmonary difficulties were fully explained to the patient, who gave informed consent.  Procedure note: The patient was placed in supine position.  After induction of general endotracheal anesthesia, the abdomen was prepped and draped using the usual sterile technique with DuraPrep.  Surgical site confirmation was performed.  An upper abdominal midline incision was made down the peritoneal cavity.  The peritoneal cavity was entered into without difficulty.  The liver was inspected and noted to be within normal limits.  On inspection of the stomach, a 1 cm prepyloric posterior perforated ulcer was found.  There was limited amount of cloudy fluid in the abdominal cavity.  No abscess or food particles were present.  The gastrocolic omentum was divided along the greater curvature.  This along with some greater omentum was then swung up and used as a patch and a Graham plication manner using 3-0 silk sutures.  Other surrounding omentum was secured to this region over the patch using 3-0 silk sutures.  The left upper quadrant of the abdomen was copiously irrigated with normal saline.  The fascia was reapproximated using an 0 PDS running suture.  Exparel was instilled into the surrounding wound.  The skin was closed using  staples.  Betadine ointment and dry sterile dressings were applied.  All tape and needle counts were correct at the end of the procedure.  The patient was extubated in the operating room and transferred to PACU in stable condition.  Complications: None  EBL: Minimal  Specimen: None

## 2018-03-17 NOTE — Anesthesia Preprocedure Evaluation (Signed)
Anesthesia Evaluation  Patient identified by MRN, date of birth, ID band Patient awake    Reviewed: Allergy & Precautions, H&P , NPO status , Patient's Chart, lab work & pertinent test results, reviewed documented beta blocker date and time   Airway Mallampati: II  TM Distance: >3 FB Neck ROM: full    Dental no notable dental hx. (+) Poor Dentition, Dental Advidsory Given   Pulmonary neg pulmonary ROS, Current Smoker,    Pulmonary exam normal breath sounds clear to auscultation       Cardiovascular Exercise Tolerance: Good negative cardio ROS   Rhythm:regular Rate:Normal     Neuro/Psych  Headaches, PSYCHIATRIC DISORDERS Anxiety Depression negative neurological ROS  negative psych ROS   GI/Hepatic negative GI ROS, Neg liver ROS, GERD  ,  Endo/Other  negative endocrine ROS  Renal/GU negative Renal ROS  negative genitourinary   Musculoskeletal   Abdominal   Peds  Hematology negative hematology ROS (+) anemia ,   Anesthesia Other Findings Suspected perforated viscous   Off unknown HTN meds one year - self d/cd 1-2 ppd tobacco abusehgb 14.7, K3.4  Reproductive/Obstetrics negative OB ROS                             Anesthesia Physical Anesthesia Plan  ASA: IV and emergent  Anesthesia Plan: General   Post-op Pain Management:    Induction:   PONV Risk Score and Plan:   Airway Management Planned:   Additional Equipment:   Intra-op Plan:   Post-operative Plan:   Informed Consent: I have reviewed the patients History and Physical, chart, labs and discussed the procedure including the risks, benefits and alternatives for the proposed anesthesia with the patient or authorized representative who has indicated his/her understanding and acceptance.   Dental Advisory Given  Plan Discussed with: CRNA and Anesthesiologist  Anesthesia Plan Comments:         Anesthesia Quick  Evaluation

## 2018-03-17 NOTE — Progress Notes (Signed)
At approximately 1625 patient stated she was nausea and having side pain. Patient declined IS instruction at this time.

## 2018-03-17 NOTE — Discharge Instructions (Signed)
Peptic Ulcer  A peptic ulcer is a sore in the lining of the esophagus (esophageal ulcer), the stomach (gastric ulcer), or the first part of the small intestine (duodenal ulcer). The ulcer causes gradual wearing away (erosion) into the deeper tissue.  What are the causes?  Normally, the lining of the stomach and the small intestine protects itself from the acid that digests food. The protective lining can be damaged by:   An infection caused by a germ (bacterium) called Helicobacter pylori or H. pylori.   Regular use of NSAIDs, such as ibuprofen or aspirin.   Rare tumors in the stomach, small intestine, or pancreas (Zollinger-Ellison syndrome).    What increases the risk?  The following factors may make you more likely to develop this condition:   Smoking.   Having a family history of ulcer disease.    What are the signs or symptoms?  Symptoms of this condition include:   Burning pain or gnawing in the area between the chest and the belly button. The pain may be worse on an empty stomach and at night.   Heartburn.   Nausea and vomiting.   Bloating.    If the ulcer results in bleeding, it can cause:   Black, tarry stools.   Vomiting of bright red blood.   Vomiting of material that looks like coffee grounds.    How is this diagnosed?  This condition may be diagnosed based on:   Medical history and physical exam.   Various tests or procedures, such as:  ? Blood tests, stool tests, or breath tests to check for the H. pylori bacterium.  ? An X-ray exam (upper gastrointestinal series) of the esophagus, stomach, and small intestine.  ? Upper endoscopy. The health care provider examines the esophagus, stomach, and small intestine using a small flexible tube that has a video camera at the end.  ? Biopsy. A tissue sample is removed to be examined under a microscope.    How is this treated?  Treatment for this condition may include:   Eliminating the cause of the ulcer, such as smoking or the use of NSAIDs or  alcohol.   Medicines to reduce the amount of acid in your digestive tract.   Antibiotic medicines, if the ulcer is caused by the H. pylori bacterium.   An upper endoscopy to treat a bleeding ulcer.   Surgery, if the bleeding is severe or if the ulcer created a hole somewhere in the digestive system.    Follow these instructions at home:   Avoid alcohol and caffeine.   Do not use any tobacco products, such as cigarettes, chewing tobacco, and e-cigarettes. If you need help quitting, ask your health care provider.   Take over-the-counter and prescription medicines only as told by your health care provider. Do not use over-the-counter medicines in place of prescription medicines unless your health care provider approves.   Keep all follow-up visits as told by your health care provider. This is important.  Contact a health care provider if:   Your symptoms do not improve within 7 days of starting treatment.   You have ongoing indigestion or heartburn.  Get help right away if:   You have sudden, sharp, or persistent pain in your abdomen.   You have bloody or dark black, tarry stools.   You vomit blood or material that looks like coffee grounds.   You become light-headed or you feel faint.   You become weak.   You become sweaty or clammy.    This information is not intended to replace advice given to you by your health care provider. Make sure you discuss any questions you have with your health care provider.  Document Released: 07/28/2000 Document Revised: 01/03/2016 Document Reviewed: 05/01/2015  Elsevier Interactive Patient Education  2018 [employer]

## 2018-03-17 NOTE — Anesthesia Postprocedure Evaluation (Signed)
Anesthesia Post Note  Patient: Tiffany Stewart  Procedure(s) Performed: EXPLORATORY LAPAROTOMY (N/A ) GASTRORRHAPHY  Patient location during evaluation: PACU Anesthesia Type: General Level of consciousness: awake and alert and patient cooperative Pain management: satisfactory to patient Vital Signs Assessment: post-procedure vital signs reviewed and stable Respiratory status: spontaneous breathing and patient connected to nasal cannula oxygen Cardiovascular status: blood pressure returned to baseline Postop Assessment: no apparent nausea or vomiting Anesthetic complications: no     Last Vitals:  Vitals:   03/17/18 0815 03/17/18 0830  BP: (!) 145/105 (!) 136/99  Pulse: (!) 102 97  Resp: 20 (!) 21  Temp:    SpO2: 96% 97%    Last Pain:  Vitals:   03/17/18 0830  TempSrc:   PainSc: 6                  Gerre Ranum

## 2018-03-17 NOTE — H&P (Signed)
Tiffany Stewart is an 47 y.o. female.   Chief Complaint: Abdominal pain HPI: Patient is a 47 year old white female who was brought to the emergency room by family member for worsening abdominal pain.  She states the pain came suddenly upon her.  CT scan of the abdomen revealed perforated viscus most likely secondary to a perforated peptic ulcer.  Patient states she has been taking Goody powders to treat a headache.  She has a history of chronic pain, anxiety, and hereditary spastic paraplegia.  She describes her pain as 10 out of 10.  Past Medical History:  Diagnosis Date  . Anxiety   . Chronic pain   . Depression   . GERD (gastroesophageal reflux disease)   . HSP (hereditary spastic paraplegia) ([place])   . Migraines     Past Surgical History:  Procedure Laterality Date  . CESAREAN SECTION    . CHOLECYSTECTOMY    . ENDOMETRIAL ABLATION  2000    Family History  Problem Relation Age of Onset  . Diabetes Father   . Cancer Father        lung cancer  . Heart disease Father   . Heart disease Maternal Grandmother   . Stroke Maternal Grandfather   . Diabetes Paternal Grandmother   . Colon cancer Neg Hx    Social History:  reports that she has been smoking cigarettes.  She has a 27.00 pack-year smoking history. She has never used smokeless tobacco. She reports that she does not drink alcohol or use drugs.  Allergies:  Allergies  Allergen Reactions  . Hctz [Hydrochlorothiazide] Diarrhea  . Morphine Other (See Comments)    Flushing, turns skin red   . Zoloft [Sertraline Hcl] Other (See Comments)    Spaced out     (Not in a hospital admission)  Results for orders placed or performed during the hospital encounter of 03/16/18 (from the past 48 hour(s))  CBG monitoring, ED     Status: Abnormal   Collection Time: 03/16/18 11:26 PM  Result Value Ref Range   Glucose-Capillary 119 (H) 70 - 99 mg/dL  Comprehensive metabolic panel     Status: Abnormal   Collection Time: 03/16/18 11:44 PM   Result Value Ref Range   Sodium 139 135 - 145 mmol/L   Potassium 3.4 (L) 3.5 - 5.1 mmol/L   Chloride 105 98 - 111 mmol/L   CO2 23 22 - 32 mmol/L   Glucose, Bld 123 (H) 70 - 99 mg/dL   BUN 45 (H) 6 - 20 mg/dL   Creatinine, Ser 0.71 0.44 - 1.00 mg/dL   Calcium 9.4 8.9 - 10.3 mg/dL   Total Protein 8.1 6.5 - 8.1 g/dL   Albumin 4.0 3.5 - 5.0 g/dL   AST 16 15 - 41 U/L   ALT 11 0 - 44 U/L   Alkaline Phosphatase 54 38 - 126 U/L   Total Bilirubin 0.6 0.3 - 1.2 mg/dL   GFR calc non Af Amer >60 >60 mL/min   GFR calc Af Amer >60 >60 mL/min    Comment: (NOTE) The eGFR has been calculated using the CKD EPI equation. This calculation has not been validated in all clinical situations. eGFR's persistently <60 mL/min signify possible Chronic Kidney Disease.    Anion gap 11 5 - 15    Comment: Performed at [facility], [address], [place],  [address]  Salicylate level     Status: None   Collection Time: 03/16/18 11:44 PM  Result Value Ref Range  Salicylate Lvl <3.2 2.8 - 30.0 mg/dL    Comment: Performed at [facility], [address], [place], [place] [address]  Acetaminophen level     Status: Abnormal   Collection Time: 03/16/18 11:44 PM  Result Value Ref Range   Acetaminophen (Tylenol), Serum <10 (L) 10 - 30 ug/mL    Comment: (NOTE) Therapeutic concentrations vary significantly. A range of 10-30 ug/mL  may be an effective concentration for many patients. However, some  are best treated at concentrations outside of this range. Acetaminophen concentrations >150 ug/mL at 4 hours after ingestion  and >50 ug/mL at 12 hours after ingestion are often associated with  toxic reactions. Performed at [facility], [address], [place], [place] [address]   Ethanol     Status: None   Collection Time: 03/16/18 11:44 PM  Result Value Ref Range   Alcohol, Ethyl (B) <10 <10 mg/dL    Comment: Performed at [facility], [address], [place], [place] [address]  CBC WITH DIFFERENTIAL      Status: Abnormal   Collection Time: 03/16/18 11:44 PM  Result Value Ref Range   WBC 14.2 (H) 4.0 - 10.5 K/uL   RBC 4.40 3.87 - 5.11 MIL/uL   Hemoglobin 14.7 12.0 - 15.0 g/dL   HCT 43.2 36.0 - 46.0 %   MCV 98.2 78.0 - 100.0 fL   MCH 33.4 26.0 - 34.0 pg   MCHC 34.0 30.0 - 36.0 g/dL   RDW 13.5 11.5 - 15.5 %   Platelets 409 (H) 150 - 400 K/uL   Neutrophils Relative % 87 %   Neutro Abs 12.3 (H) 1.7 - 7.7 K/uL   Lymphocytes Relative 8 %   Lymphs Abs 1.1 0.7 - 4.0 K/uL   Monocytes Relative 5 %   Monocytes Absolute 0.8 0.1 - 1.0 K/uL   Eosinophils Relative 0 %   Eosinophils Absolute 0.0 0.0 - 0.7 K/uL   Basophils Relative 0 %   Basophils Absolute 0.0 0.0 - 0.1 K/uL    Comment: Performed at [facility], [address], [place], [place] [address]  hCG, serum, qualitative     Status: None   Collection Time: 03/16/18 11:44 PM  Result Value Ref Range   Preg, Serum NEGATIVE NEGATIVE    Comment:        THE SENSITIVITY OF THIS METHODOLOGY IS >10 mIU/mL. Performed at [facility], [address] [place], [place] [address]    Ct Abdomen Pelvis Wo Contrast  Result Date: 03/17/2018 CLINICAL DATA:  Abdominal distension. EXAM: CT ABDOMEN AND PELVIS WITHOUT CONTRAST TECHNIQUE: Multidetector CT imaging of the abdomen and pelvis was performed following the standard protocol without IV contrast. COMPARISON:  CT 05/01/2017 FINDINGS: Lower chest: Linear atelectasis in both lower lobes, left greater than right. Hepatobiliary: No focal lesion on noncontrast exam. Clips in the gallbladder fossa postcholecystectomy. No biliary dilatation. Pancreas: Mild parenchymal atrophy. No ductal dilatation or inflammation. Spleen: Normal in size without focal abnormality. Adrenals/Urinary Tract: Mild left adrenal thickening. No adrenal nodule. No hydronephrosis or perinephric edema. Punctate nonobstructing stone in the upper left kidney unchanged. Urinary bladder is partially distended without wall thickening. Stomach/Bowel:  Moderate volume of free air in the upper abdomen consistent with perforated viscus. Gastric wall thickening about the antrum. Fluid-filled duodenum without definite wall thickening. Patient motion limits detailed evaluation, distal small bowel are fluid-filled and prominent. Fluid/liquid stool in the ascending colon. No definite colonic inflammation. Vascular/Lymphatic: No aortic aneurysm. IVC is collapsed. No bulky adenopathy, limited assessment due  to motion and lack contrast. Reproductive: Uterus and bilateral adnexa are unremarkable. Other: Ill-defined fluid/fluid collection just inferior to the stomach, difficult to delineate from adjacent bowel measuring approximately 6.9 cm in transverse dimension. Moderate volume of free air centered in the upper abdomen. Musculoskeletal: There are no acute or suspicious osseous abnormalities. IMPRESSION: 1. Moderate volume free intra-abdominal air, site of perforation suspected to be the stomach with gastric wall thickening and perigastric fluid/fluid collection. 2. Fluid-filled prominent small bowel may be reactive ileus. Critical Value/emergent results were called by telephone at the time of interpretation on 03/17/2018 at 2:07 am to Dr. Ripley Fraise , who verbally acknowledged these results. Electronically Signed   By: Jeb Levering M.D.   On: 03/17/2018 02:07    Review of Systems  Constitutional: Positive for malaise/fatigue.  HENT: Negative.   Eyes: Negative.   Respiratory: Negative.   Cardiovascular: Negative.   Gastrointestinal: Positive for abdominal pain, heartburn and nausea.  Genitourinary: Negative.   Skin: Negative.   Neurological: Positive for tremors and headaches.  Endo/Heme/Allergies: Negative.   Psychiatric/Behavioral: Positive for depression. The patient is nervous/anxious.     Blood pressure (!) 146/105, pulse 100, temperature 99.7 F (37.6 C), temperature source Rectal, resp. rate (!) 24, weight 105 lb (47.6 kg), SpO2 96  %. Physical Exam  Vitals reviewed. Constitutional: She is oriented to person, place, and time. She appears well-developed and well-nourished. She appears distressed.  HENT:  Head: Normocephalic and atraumatic.  Cardiovascular: Normal rate, regular rhythm and normal heart sounds. Exam reveals no gallop and no friction rub.  No murmur heard. Respiratory: Effort normal and breath sounds normal. No respiratory distress. She has no wheezes. She has no rales.  GI: There is tenderness. There is rebound and guarding.  Rigidity noted.  Tender throughout abdomen to palpation.  Neurological: She is alert and oriented to person, place, and time.  Skin: Skin is warm and dry.    CT scan report reviewed Assessment/Plan Impression: Peritonitis secondary to perforated viscus, probable peptic ulcer disease perforation Plan: We will take patient to the operating room for exploratory laparotomy.  The risks and benefits of the procedure including bleeding, infection, and cardiopulmonary difficulties were fully explained to the patient, who gave informed consent.  Fluid boluses and Zosyn have been started.  Aviva Signs, MD 03/17/2018, 3:59 AM

## 2018-03-17 NOTE — Progress Notes (Signed)
Upon entering patient's room to take patient's vital signs, this RN noted patient had removed NG tube to left nare.  Patient admitted to removing NG tube on purpose and it wasn't by accident.  Patient stated the NG tube was causing her pain and that she was going to be okay without it.  Notified Dr. Arnoldo Morale of NG tube being removed by patient via page and received call back from Dr. Arnoldo Morale that it was okay to leave NG tube out.

## 2018-03-17 NOTE — Addendum Note (Signed)
Addendum  created 03/17/18 0845 by Vista Deck, CRNA   Charge Capture section accepted, Intraprocedure Flowsheets edited

## 2018-03-17 NOTE — Transfer of Care (Signed)
Immediate Anesthesia Transfer of Care Note  Patient: Tiffany Stewart  Procedure(s) Performed: EXPLORATORY LAPAROTOMY (N/A ) GASTRORRHAPHY  Patient Location: PACU  Anesthesia Type:General  Level of Consciousness: awake, alert , oriented and patient cooperative  Airway & Oxygen Therapy: Patient Spontanous Breathing  Post-op Assessment: Report given to RN  Post vital signs: Reviewed and stable  Last Vitals:  Vitals Value Taken Time  BP 153/108 03/17/2018  7:46 AM  Temp 37 C 03/17/2018  7:46 AM  Pulse 100 03/17/2018  7:54 AM  Resp 31 03/17/2018  7:54 AM  SpO2 94 % 03/17/2018  7:54 AM  Vitals shown include unvalidated device data.  Last Pain:  Vitals:   03/17/18 0746  TempSrc: Tympanic  PainSc: 5       Patients Stated Pain Goal: 7 (82/41/75 3010)  Complications: No apparent anesthesia complications

## 2018-03-17 NOTE — ED Provider Notes (Signed)
Patient seen/examined in the Emergency Department in conjunction with Midlevel Provider  Patient reports she took baclofen due to left side pain Exam : somnolent but arousable, she has left flank pain Plan: Patient poor historian, is present for overdose no apparent abdominal pain.  Labs pending.  Obtain CT of the abd/ pelvis since she does have a history of bowel obstruction previously   Ripley Fraise, MD 03/17/18 0025

## 2018-03-17 NOTE — Addendum Note (Signed)
Addendum  created 03/17/18 0852 by Vista Deck, CRNA   Sign clinical note

## 2018-03-17 NOTE — Addendum Note (Signed)
Addendum  created 03/17/18 0803 by Vista Deck, CRNA   Attestation recorded in [place], [place] filed, Dance movement psychotherapist edited

## 2018-03-17 NOTE — Addendum Note (Signed)
Addendum  created 03/17/18 0800 by Vena Rua, MD   Order list changed, Order sets accessed

## 2018-03-17 NOTE — Anesthesia Postprocedure Evaluation (Signed)
Anesthesia Post Note  Patient: Tiffany Stewart  Procedure(s) Performed: EXPLORATORY LAPAROTOMY (N/A ) GASTRORRHAPHY  Patient location during evaluation: PACU Anesthesia Type: General Level of consciousness: awake, awake and alert, patient cooperative and oriented Pain management: pain level controlled Vital Signs Assessment: post-procedure vital signs reviewed and stable Respiratory status: spontaneous breathing and respiratory function stable Cardiovascular status: blood pressure returned to baseline Postop Assessment: no headache and no backache Anesthetic complications: no Comments: BP returned to baseline- essential HTN not treated at home for over one year- pt self d/c'd her anti-HTN meds     Last Vitals:  Vitals:   03/17/18 0745 03/17/18 0746  BP: (!) 153/108 (!) 153/108  Pulse: 100 100  Resp: (!) 24   Temp:  37 C  SpO2: 94%     Last Pain:  Vitals:   03/17/18 0746  TempSrc: Tympanic  PainSc: 5                  Darnell Level Denario Bagot

## 2018-03-18 ENCOUNTER — Encounter (HOSPITAL_COMMUNITY): Payer: Self-pay | Admitting: General Surgery

## 2018-03-18 DIAGNOSIS — K275 Chronic or unspecified peptic ulcer, site unspecified, with perforation: Secondary | ICD-10-CM

## 2018-03-18 LAB — CBC
HCT: 38 % (ref 36.0–46.0)
Hemoglobin: 12.3 g/dL (ref 12.0–15.0)
MCH: 32.8 pg (ref 26.0–34.0)
MCHC: 32.4 g/dL (ref 30.0–36.0)
MCV: 101.3 fL — ABNORMAL HIGH (ref 78.0–100.0)
Platelets: 348 10*3/uL (ref 150–400)
RBC: 3.75 MIL/uL — ABNORMAL LOW (ref 3.87–5.11)
RDW: 14.1 % (ref 11.5–15.5)
WBC: 9.2 10*3/uL (ref 4.0–10.5)

## 2018-03-18 LAB — BASIC METABOLIC PANEL
Anion gap: 9 (ref 5–15)
BUN: 30 mg/dL — ABNORMAL HIGH (ref 6–20)
CALCIUM: 7.7 mg/dL — AB (ref 8.9–10.3)
CO2: 20 mmol/L — ABNORMAL LOW (ref 22–32)
Chloride: 110 mmol/L (ref 98–111)
Creatinine, Ser: 0.58 mg/dL (ref 0.44–1.00)
Glucose, Bld: 66 mg/dL — ABNORMAL LOW (ref 70–99)
Potassium: 2.7 mmol/L — CL (ref 3.5–5.1)
SODIUM: 139 mmol/L (ref 135–145)

## 2018-03-18 LAB — PHOSPHORUS: PHOSPHORUS: 2.6 mg/dL (ref 2.5–4.6)

## 2018-03-18 LAB — MAGNESIUM: Magnesium: 1.9 mg/dL (ref 1.7–2.4)

## 2018-03-18 MED ORDER — KCL IN DEXTROSE-NACL 20-5-0.45 MEQ/L-%-% IV SOLN
INTRAVENOUS | Status: DC
  Administered 2018-03-18 – 2018-03-19 (×2): via INTRAVENOUS

## 2018-03-18 MED ORDER — POTASSIUM CHLORIDE 10 MEQ/100ML IV SOLN
10.0000 meq | INTRAVENOUS | Status: AC
  Administered 2018-03-18 (×4): 10 meq via INTRAVENOUS
  Filled 2018-03-18 (×3): qty 100

## 2018-03-18 MED ORDER — POTASSIUM CHLORIDE 10 MEQ/100ML IV SOLN: 10.0000 meq | INTRAVENOUS | Status: DC

## 2018-03-18 NOTE — Anesthesia Postprocedure Evaluation (Signed)
Anesthesia Post Note  Patient: Tiffany Stewart  Procedure(s) Performed: EXPLORATORY LAPAROTOMY (N/A ) GASTRORRHAPHY  Patient location during evaluation: Nursing Unit Anesthesia Type: General Level of consciousness: awake and alert and patient cooperative Pain management: satisfactory to patient Vital Signs Assessment: post-procedure vital signs reviewed and stable Respiratory status: spontaneous breathing Cardiovascular status: stable Postop Assessment: no apparent nausea or vomiting Anesthetic complications: no     Last Vitals:  Vitals:   03/17/18 2132 03/18/18 0527  BP: (!) 114/93 121/87  Pulse: (!) 129 (!) 107  Resp: (!) 39 19  Temp: 37.2 C 36.7 C  SpO2: 93% 96%    Last Pain:  Vitals:   03/18/18 0527  TempSrc: Oral  PainSc:                  Drucie Opitz

## 2018-03-18 NOTE — Addendum Note (Signed)
Addendum  created 03/18/18 0835 by Vista Deck, CRNA   Sign clinical note

## 2018-03-18 NOTE — Progress Notes (Signed)
Initial Nutrition Assessment  DOCUMENTATION CODES:      INTERVENTION:  Follow diet advancement  Provide General healthful diet education for family   NUTRITION DIAGNOSIS:   Predicted suboptimal nutrient intake related to limited prior education(poor quality- food choices) as evidenced by per patient/family report.  GOAL:   Patient will meet greater than or equal to 90% of their needs  MONITOR:   Diet advancement, PO intake, Weight trends, Labs, Skin  REASON FOR ASSESSMENT:   Malnutrition Screening Tool    ASSESSMENT:  Patient is a 47 yo female with hx of migraine headaches, spastic paraplegia. She has been taking Goody powders daily to help manage migraine pain. Complained of abdominal pain on admission. CT findings perforated viscus. She is s/p exploratory laparotomy, gastrorrhaphy on 8/4. Patient removed NGT and it was not replaced. This morning she has a critical potassium 2.7 which is being repleted. NPO  currently. Patient is complaining that her stomach is growling.  Patient home diet is regular. A niece usually shops for food and the patients daughter prepares her food. Breakfast is either fried bologna or bowl grits, Lunch is often pizza or chicken sandwich, the evening is sometimes a snack as ice cream or cookies. Daughter doesn't cook but says the neice does occasionally. Patient diet is high in fat and sodium and poor in nutrient quality. The family are not here currently but would benefit from nutrition education generally healthful diet guidelines since they are shopping and cooking food for the patient.    Patient weight hx shows a gain of ~28 lbs (26%) in the past 11 months. Patient says usual weight is around 100 lb and that she currently "has fluid". Mild edema noted lower extremities. Moderate muscle loss temporal region and moderate orbital and buccal fat loss. Edema may be masking additional changes.   Labs: BMP Latest Ref Rng & Units 03/18/2018 03/16/2018  01/28/2018  Glucose 70 - 99 mg/dL 66(L) 123(H) 86  BUN 6 - 20 mg/dL 30(H) 45(H) 19  Creatinine 0.44 - 1.00 mg/dL 0.58 0.71 0.43(L)  BUN/Creat Ratio 9 - 23 - - 44(H)  Sodium 135 - 145 mmol/L 139 139 142  Potassium 3.5 - 5.1 mmol/L 2.7(LL) 3.4(L) 3.9  Chloride 98 - 111 mmol/L 110 105 106  CO2 22 - 32 mmol/L 20(L) 23 22  Calcium 8.9 - 10.3 mg/dL 7.7(L) 9.4 9.4    Medications reviewed and include: pepcid, zosyn   IVF-D5/ 0.5 NaCl with KCL @75  ml;/hr   NUTRITION - FOCUSED PHYSICAL EXAM: Moderate muscle loss temporal region and moderate orbital, buccal fat loss and mild lower extremity edema.  Diet Order:   Diet Order           Diet NPO time specified Except for: Ice Chips, Sips with Meds  Diet effective now          EDUCATION NEEDS:  Not appropriate for education at this time(family to be educated if agreeable)   Skin:  Skin Assessment: (surgical incision)  Last BM:  03/16/18   Height:   Ht Readings from Last 1 Encounters:  03/17/18 5\' 1"  (1.549 m)    Weight:   Wt Readings from Last 1 Encounters:  03/17/18 133 lb 2.5 oz (60.4 kg)    Ideal Body Weight:  48 kg  BMI:  Body mass index is 25.16 kg/m.  Estimated Nutritional Needs:   Kcal:  2778-2423  (28-30 kcal/kg/ibw)(based on ideal weight)  Protein:  70-75 gr   Fluid:  >1400 ml daily  Jeani Hawking  Billyjoe Go MS,RD,CSG,LDN Office: [phone] Pager: [phone]

## 2018-03-18 NOTE — Progress Notes (Signed)
[facility] Progress Note  1 Day Post-Op  Subjective: Patient doing fair. No major complaints.  Ng was pulled by patient overnight and Dr. Arnoldo Morale notified. NG left out. Patient not complaining of major pain. Overall looking well.   Objective: Vital signs in last 24 hours: Temp:  [98 F (36.7 C)-98.9 F (37.2 C)] 98.2 F (36.8 C) (08/05 1407) Pulse Rate:  [107-129] 107 (08/05 1407) Resp:  [18-39] 18 (08/05 1407) BP: (114-132)/(87-97) 132/97 (08/05 1407) SpO2:  [93 %-96 %] 95 % (08/05 1407) Last BM Date: 03/16/18  Intake/Output from previous day: 08/04 0701 - 08/05 0700 In: 1982.5 [P.O.:25; I.V.:1861.7; IV Piggyback:95.8] Out: 270 [Urine:250; Blood:20] Intake/Output this shift: Total I/O In: 795.2 [I.V.:203.8; IV Piggyback:591.5] Out: -   General appearance: alert, cooperative and no distress Resp: normal work breathing GI: soft, appropriately tender, honeycomb without staining, no erythema or drainage Extremities: no edema, heel pressure dressings in place, some what contractured in the lower extremities  Lab Results:  Recent Labs    03/16/18 2344 03/18/18 0546  WBC 14.2* 9.2  HGB 14.7 12.3  HCT 43.2 38.0  PLT 409* 348   BMET Recent Labs    03/16/18 2344 03/18/18 0546  NA 139 139  K 3.4* 2.7*  CL 105 110  CO2 23 20*  GLUCOSE 123* 66*  BUN 45* 30*  CREATININE 0.71 0.58  CALCIUM 9.4 7.7*    Studies/Results: Ct Abdomen Pelvis Wo Contrast  Result Date: 03/17/2018 CLINICAL DATA:  Abdominal distension. EXAM: CT ABDOMEN AND PELVIS WITHOUT CONTRAST TECHNIQUE: Multidetector CT imaging of the abdomen and pelvis was performed following the standard protocol without IV contrast. COMPARISON:  CT 05/01/2017 FINDINGS: Lower chest: Linear atelectasis in both lower lobes, left greater than right. Hepatobiliary: No focal lesion on noncontrast exam. Clips in the gallbladder fossa postcholecystectomy. No biliary dilatation. Pancreas: Mild parenchymal atrophy.  No ductal dilatation or inflammation. Spleen: Normal in size without focal abnormality. Adrenals/Urinary Tract: Mild left adrenal thickening. No adrenal nodule. No hydronephrosis or perinephric edema. Punctate nonobstructing stone in the upper left kidney unchanged. Urinary bladder is partially distended without wall thickening. Stomach/Bowel: Moderate volume of free air in the upper abdomen consistent with perforated viscus. Gastric wall thickening about the antrum. Fluid-filled duodenum without definite wall thickening. Patient motion limits detailed evaluation, distal small bowel are fluid-filled and prominent. Fluid/liquid stool in the ascending colon. No definite colonic inflammation. Vascular/Lymphatic: No aortic aneurysm. IVC is collapsed. No bulky adenopathy, limited assessment due to motion and lack contrast. Reproductive: Uterus and bilateral adnexa are unremarkable. Other: Ill-defined fluid/fluid collection just inferior to the stomach, difficult to delineate from adjacent bowel measuring approximately 6.9 cm in transverse dimension. Moderate volume of free air centered in the upper abdomen. Musculoskeletal: There are no acute or suspicious osseous abnormalities. IMPRESSION: 1. Moderate volume free intra-abdominal air, site of perforation suspected to be the stomach with gastric wall thickening and perigastric fluid/fluid collection. 2. Fluid-filled prominent small bowel may be reactive ileus. Critical Value/emergent results were called by telephone at the time of interpretation on 03/17/2018 at 2:07 am to Dr. Ripley Fraise , who verbally acknowledged these results. Electronically Signed   By: Jeb Levering M.D.   On: 03/17/2018 02:07    Anti-infectives: Anti-infectives (From admission, onward)   Start     Dose/Rate Route Frequency Ordered Stop   03/17/18 1000  piperacillin-tazobactam (ZOSYN) IVPB 3.375 g     3.375 g 12.5 mL/hr over 240 Minutes Intravenous Every 8 hours 03/17/18 0909  03/17/18 0215  piperacillin-tazobactam (ZOSYN) IVPB 3.375 g     3.375 g 100 mL/hr over 30 Minutes Intravenous  Once 03/17/18 1657 03/17/18 0252      Assessment/Plan: Ms. Schlick is a 47 yo POD 1 s/p Ex lap with gastrorrphaphy and omental patch. Doing fair. NG was pulled out and Dr. Arnoldo Morale made aware overnight.  -PRN for pain -IS, OOB to chair, is able to get around with a wheel chair at home, has spastic paraplegia at baseline  -HD ok -NPO, has sips/ meds ordered  -UGI ordered for AM to assess for any residual leak, decision with Dr. Arnoldo Morale that perforation was small area posterior that had healed essentially prior to him operating, minimal contamination, no JP, Pepcid BID ordered  -Lytes replaced already, Fluid changed to include IV K given low K this AM, no foley, and occurrences that went unmeasured, Cr looking ok  -SCDs, lovenox -Labs in AM, No WBC, perioperative zosyn for now    LOS: 1 day    [name] 03/18/2018

## 2018-03-18 NOTE — Progress Notes (Signed)
CRITICAL VALUE ALERT  Critical Value:  Potassium 2.7  Date & Time Notied:  03/18/2018, 2119  Provider Notified: Dr. Arnoldo Morale  Orders Received/Actions taken: Orders to administer potassium chloride 10 mEq in 100 mL IVPB every 1 hr x 4 Will continue to monitor patient

## 2018-03-19 ENCOUNTER — Inpatient Hospital Stay (HOSPITAL_COMMUNITY): Payer: Medicare Other

## 2018-03-19 LAB — CBC WITH DIFFERENTIAL/PLATELET
BASOS PCT: 0 %
Basophils Absolute: 0 10*3/uL (ref 0.0–0.1)
EOS ABS: 0.1 10*3/uL (ref 0.0–0.7)
Eosinophils Relative: 1 %
HCT: 34.4 % — ABNORMAL LOW (ref 36.0–46.0)
Hemoglobin: 11.5 g/dL — ABNORMAL LOW (ref 12.0–15.0)
Lymphocytes Relative: 13 %
Lymphs Abs: 1.5 10*3/uL (ref 0.7–4.0)
MCH: 33.1 pg (ref 26.0–34.0)
MCHC: 33.4 g/dL (ref 30.0–36.0)
MCV: 99.1 fL (ref 78.0–100.0)
MONO ABS: 0.3 10*3/uL (ref 0.1–1.0)
Monocytes Relative: 3 %
NEUTROS PCT: 83 %
Neutro Abs: 9.6 10*3/uL — ABNORMAL HIGH (ref 1.7–7.7)
Platelets: 379 10*3/uL (ref 150–400)
RBC: 3.47 MIL/uL — ABNORMAL LOW (ref 3.87–5.11)
RDW: 13.7 % (ref 11.5–15.5)
WBC: 11.4 10*3/uL — ABNORMAL HIGH (ref 4.0–10.5)

## 2018-03-19 LAB — BASIC METABOLIC PANEL
Anion gap: 5 (ref 5–15)
BUN: 17 mg/dL (ref 6–20)
CALCIUM: 7.8 mg/dL — AB (ref 8.9–10.3)
CO2: 23 mmol/L (ref 22–32)
CREATININE: 0.39 mg/dL — AB (ref 0.44–1.00)
Chloride: 109 mmol/L (ref 98–111)
GFR calc Af Amer: >60 mL/min (ref >60)
GFR calc non Af Amer: >60 mL/min (ref >60)
Glucose, Bld: 86 mg/dL (ref 70–99)
Potassium: 3.4 mmol/L — ABNORMAL LOW (ref 3.5–5.1)
SODIUM: 137 mmol/L (ref 135–145)

## 2018-03-19 LAB — PHOSPHORUS: Phosphorus: 1.2 mg/dL — ABNORMAL LOW (ref 2.5–4.6)

## 2018-03-19 LAB — MAGNESIUM: MAGNESIUM: 2.1 mg/dL (ref 1.7–2.4)

## 2018-03-19 LAB — H. PYLORI ANTIBODY, IGG

## 2018-03-19 MED ORDER — TRACE MINERALS CR-CU-MN-SE-ZN 10-1000-500-60 MCG/ML IV SOLN
40.0000 mL/h | INTRAVENOUS | Status: DC
  Filled 2018-03-19: qty 960

## 2018-03-19 MED ORDER — IOPAMIDOL (ISOVUE-300) INJECTION 61%
INTRAVENOUS | Status: AC
  Administered 2018-03-19: 125 mL via ORAL
  Filled 2018-03-19: qty 300

## 2018-03-19 MED ORDER — FAT EMULSION PLANT BASED 20 % IV EMUL: 250.0000 mL | INTRAVENOUS | Status: DC

## 2018-03-19 MED ORDER — IOPAMIDOL (ISOVUE-370) INJECTION 76%
INTRAVENOUS | Status: AC
  Filled 2018-03-19: qty 400

## 2018-03-19 MED ORDER — TRACE MINERALS CR-CU-MN-SE-ZN 10-1000-500-60 MCG/ML IV SOLN
INTRAVENOUS | Status: DC
  Administered 2018-03-19: 18:00:00 via INTRAVENOUS
  Filled 2018-03-19: qty 960

## 2018-03-19 MED ORDER — SODIUM CHLORIDE 0.9% FLUSH
10.0000 mL | INTRAVENOUS | Status: DC | PRN
  Administered 2018-03-26: 10 mL
  Filled 2018-03-19: qty 40

## 2018-03-19 MED ORDER — FAT EMULSION PLANT BASED 20 % IV EMUL
250.0000 mL | INTRAVENOUS | Status: DC
  Filled 2018-03-19: qty 250

## 2018-03-19 MED ORDER — PANTOPRAZOLE SODIUM 40 MG IV SOLR: 40.0000 mg | Freq: Two times a day (BID) | INTRAVENOUS | Status: DC

## 2018-03-19 MED ORDER — HYDRALAZINE HCL 20 MG/ML IJ SOLN
5.0000 mg | Freq: Four times a day (QID) | INTRAMUSCULAR | Status: DC | PRN
Start: 2018-03-19 — End: 2018-03-27
  Administered 2018-03-19 – 2018-03-22 (×2): 5 mg via INTRAVENOUS
  Filled 2018-03-19 (×2): qty 1

## 2018-03-19 MED ORDER — HYDRALAZINE HCL 20 MG/ML IJ SOLN: 5.0000 mg | Freq: Four times a day (QID) | INTRAMUSCULAR | Status: DC | PRN

## 2018-03-19 MED ORDER — SODIUM CHLORIDE 0.9% FLUSH
10.0000 mL | Freq: Two times a day (BID) | INTRAVENOUS | Status: DC
  Administered 2018-03-19 – 2018-03-24 (×11): 10 mL
  Administered 2018-03-24: 30 mL
  Administered 2018-03-25 – 2018-03-26 (×3): 10 mL

## 2018-03-19 MED ORDER — TRACE MINERALS CR-CU-MN-SE-ZN 10-1000-500-60 MCG/ML IV SOLN: INTRAVENOUS | Status: DC

## 2018-03-19 MED ORDER — POTASSIUM CHLORIDE 10 MEQ/100ML IV SOLN
10.0000 meq | INTRAVENOUS | Status: DC
  Administered 2018-03-19: 10 meq via INTRAVENOUS
  Filled 2018-03-19 (×2): qty 100

## 2018-03-19 MED ORDER — KCL IN DEXTROSE-NACL 20-5-0.45 MEQ/L-%-% IV SOLN
INTRAVENOUS | Status: DC
  Administered 2018-03-20: 09:00:00 via INTRAVENOUS

## 2018-03-19 MED ORDER — POTASSIUM PHOSPHATES 15 MMOLE/5ML IV SOLN
20.0000 mmol | Freq: Once | INTRAVENOUS | Status: AC
  Administered 2018-03-19: 20 mmol via INTRAVENOUS
  Filled 2018-03-19: qty 6.67

## 2018-03-19 MED ORDER — FAT EMULSION PLANT BASED 20 % IV EMUL
250.0000 mL | INTRAVENOUS | Status: AC
  Administered 2018-03-19: 250 mL via INTRAVENOUS
  Filled 2018-03-19: qty 250

## 2018-03-19 NOTE — Progress Notes (Signed)
[facility]  Personally reviewed small contained leak on UGI  Studies/Results: Dg Ugi W/water Sol Cm  Result Date: 03/19/2018 CLINICAL DATA:  Perforated gastric ulcer post repair on 03/17/2018 EXAM: WATER SOLUBLE UPPER GI SERIES TECHNIQUE: Single-column upper GI series was performed using water soluble contrast. CONTRAST:  125 mL Isovue-300 IV COMPARISON:  CT abdomen and pelvis 03/17/2018 FLUOROSCOPY TIME:  Fluoroscopy Time:  3 minutes 0 seconds Radiation Exposure Index (if provided by the fluoroscopic device): 43.6 mGy Number of Acquired Spot Images: multiple fluoroscopic screen captures FINDINGS: No esophageal obstruction. Mild gastroesophageal reflux seen during exam. Stomach distends normally without evidence of outlet obstruction. Normal rugal fold pattern. At the posterior aspect of the gastric antrum, an abnormal collection of extraluminal contrast is identified consistent with perforation, likely at site of gastric perforation identified intraoperatively. This collection was contained and persisted throughout the exam. Duodenal bulb and sweep normal appearance. IMPRESSION: Extravasation of contrast from the posterior wall of the gastric antrum compatible with a contained perforation, likely corresponding to the pre-pyloric ulcer identified posteriorly at time of surgery. Electronically Signed   By: Lavonia Dana M.D.   On: 03/19/2018 10:39   Plan for NPO. Patient refuses NG and is having Bms. Will not replace now.  Will get PICC and TPN for prolonged NPO status. Will likely repeat next Monday as I think Friday will be too soon.  Asked pharmacy to do Protonix to TPN BID. H pylori stool antigen ordered. Continue zosyn for now. Continue labs daily for now. No drain in place and collection small, do not think needs drained as it is contained and likely within the omental patch. Just do not want to get too aggressive and feed prior to demonstrating this healing or at least  demonstrating that it is unchanged after 1 week for bowel rest.   Curlene Labrum, MD [facility] [place], [place] [address] [phone] (office)

## 2018-03-19 NOTE — Progress Notes (Addendum)
MEDICATION RELATED CONSULT NOTE - INITIAL   Pharmacy Consult for Electrolyte replacement (potassium and phosphorus) Indication: low electrolytes  Allergies  Allergen Reactions  . Hctz [Hydrochlorothiazide] Diarrhea  . Morphine Other (See Comments)    Flushing, turns skin red   . Zoloft [Sertraline Hcl] Other (See Comments)    Spaced out    Patient Measurements: Height: 5\' 1"  (154.9 cm) Weight: 133 lb 2.5 oz (60.4 kg) IBW/kg (Calculated) : 47.8   Vital Signs: Temp: 98.4 F (36.9 C) (08/06 0550) Temp Source: Oral (08/06 0550) BP: 148/104 (08/06 0550) Pulse Rate: 104 (08/06 0550) Intake/Output from previous day: 08/05 0701 - 08/06 0700 In: 2018.5 [P.O.:240; I.V.:1075; IV Piggyback:703.5] Out: 700 [Urine:700] Intake/Output from this shift: No intake/output data recorded.  Labs: Estimated Creatinine Clearance: 74 mL/min (A) (by C-G formula based on SCr of 0.39 mg/dL (L)).  Potassium: 3.4 Phosphorus: 1.2  Microbiology: No results found for this or any previous visit (from the past 720 hour(s)).  Medical History: Past Medical History:  Diagnosis Date  . Anxiety   . Chronic pain   . Depression   . GERD (gastroesophageal reflux disease)   . HSP (hereditary spastic paraplegia) ([place])   . Migraines     Medications:  Medications Prior to Admission  Medication Sig Dispense Refill Last Dose  . acetaminophen (TYLENOL) 500 MG tablet Take 1,500 mg by mouth every 6 (six) hours as needed for moderate pain.   Past Week at Unknown time  . ALPRAZolam (XANAX) 0.5 MG tablet TAKE 1 TABLET BY MOUTH TWICE DAILY AS NEEDED FOR ANXIETY. 60 tablet 0 Past Week at Unknown time  . baclofen (LIORESAL) 20 MG tablet TAKE 1 TABLET BY MOUTH TWICE DAILY 60 tablet 1 03/17/2018 at Unknown time  . escitalopram (LEXAPRO) 10 MG tablet Take 1 tablet (10 mg total) by mouth daily. 90 tablet 1 Past Month at Unknown time  . pantoprazole (PROTONIX) 40 MG tablet Take 1 tablet (40 mg total) by mouth 2 (two) times  daily before a meal. 60 tablet 1 Past Month at Unknown time  . tiZANidine (ZANAFLEX) 4 MG tablet TAKE 1 TABLET EVERY 6 HOURS AS NEEDED FOR MUSCLE SPASMS 120 tablet 2 03/17/2018 at Unknown time    Assessment: Pharmacy consulted to replace patient's potassium and phosphorus. Patient is NPO pending UGI. Patient goal for electrolytes is Potassium: 4 mmol/L And Phosphorus: 3 mg/dL.   Plan:  Potassium Phosphate 20 mmol x 1 dose (also provides ~29 mEq of potassium) Potassium Chloride 10 mEq IV run x 2 doses Monitor daily labs  Ramond Craver 03/19/2018,8:52 AM  Addendum: Pt now to start TPN KPHos infusing Will d/c KCl boluses(pt not tolerating peripherally and TPN will start this evening)

## 2018-03-19 NOTE — Care Management Note (Signed)
Case Management Note  Patient Details  Name: Tiffany Stewart MRN: [id number] Date of Birth: 1971-01-07  Subjective/Objective:      Admitted with perforated peptic ulcer. Pt from home, lives with niece, who is 74. Pt's sister does her shopping. Pt uses medical transport to get to MD appointments. Pt is WC bound, she is able to transfer herself ind. Pt says she is not interested in [facility] services at DC. She feels she is at baseline.               Action/Plan: DC home with self care. CM will follow to DC.   Expected Discharge Date:     03/19/2018             Expected Discharge Plan:  Home/Self Care  In-House Referral:  NA  Discharge planning Services  CM Consult  Post Acute Care Choice:  NA Choice offered to:  NA  DME Arranged:    DME Agency:     HH Arranged:    HH Agency:     Status of Service:  In process, will continue to follow  If discussed at Long Length of Stay Meetings, dates discussed:    Additional Comments:  Sherald Barge, RN 03/19/2018, 3:43 PM

## 2018-03-19 NOTE — Progress Notes (Signed)
PT Cancellation Note  Patient Details Name: Tiffany Stewart MRN: [id number] DOB: [birth year]   Cancelled Treatment:    Reason Eval/Treat Not Completed: Fatigue/lethargy limiting ability to participate.  Patient declined therapy secondary to c/o fatigue and prefers to wait until tomorrow - RN notified.   3:53 PM, 03/19/18 Lonell Grandchild, MPT Physical Therapist with [facility] 336 [phone] office [phone] mobile phone w

## 2018-03-19 NOTE — Progress Notes (Signed)
[facility] Progress Note  2 Days Post-Op  Subjective: No major issues reported. Does not like getting blood drawn. Wants food. Had two BMs this AM per her report. No nausea. UGI ordered for this AM.   Objective: Vital signs in last 24 hours: Temp:  [98.2 F (36.8 C)-98.5 F (36.9 C)] 98.4 F (36.9 C) (08/06 0550) Pulse Rate:  [104-107] 104 (08/06 0550) Resp:  [18] 18 (08/06 0550) BP: (132-148)/(97-104) 148/104 (08/06 0550) SpO2:  [93 %-95 %] 93 % (08/06 0550) Last BM Date: 03/16/18  Intake/Output from previous day: 08/05 0701 - 08/06 0700 In: 2018.5 [P.O.:240; I.V.:1075; IV Piggyback:703.5] Out: 700 [Urine:700] Intake/Output this shift: No intake/output data recorded.  General appearance: alert, cooperative and no distress Resp: normal work breathing GI: soft, mildly distended ,appropriately tender, honeycomb c/d/i with no staining or erythema  Lab Results:  Recent Labs    03/18/18 0546 03/19/18 0545  WBC 9.2 11.4*  HGB 12.3 11.5*  HCT 38.0 34.4*  PLT 348 379   BMET Recent Labs    03/18/18 0546 03/19/18 0545  NA 139 137  K 2.7* 3.4*  CL 110 109  CO2 20* 23  GLUCOSE 66* 86  BUN 30* 17  CREATININE 0.58 0.39*  CALCIUM 7.7* 7.8*   Anti-infectives: Anti-infectives (From admission, onward)   Start     Dose/Rate Route Frequency Ordered Stop   03/17/18 1000  piperacillin-tazobactam (ZOSYN) IVPB 3.375 g     3.375 g 12.5 mL/hr over 240 Minutes Intravenous Every 8 hours 03/17/18 0909     03/17/18 0215  piperacillin-tazobactam (ZOSYN) IVPB 3.375 g     3.375 g 100 mL/hr over 30 Minutes Intravenous  Once 03/17/18 2836 03/17/18 0252      Assessment/Plan: Tiffany Stewart is 48 yo POD 2 s/p Ex lap, gastrorrhaphy and omental patch for gastric perforation. Doing fair. Having BMs. -UGI This AM -PRN for pain -HD ok -Will ask RN to get her to chair today./ uses wheel chair at home -Lives will niece, will ask social work to make sure no home needs  -NPO  until after UGI, having some reflux she reports, on BID pepcid currently -Lyte replacement by pharmacy -Perioperative zosyn, WBC slightly up as expected    LOS: 2 days    [name] 03/19/2018

## 2018-03-19 NOTE — Progress Notes (Signed)
PHARMACY - ADULT TOTAL PARENTERAL NUTRITION CONSULT NOTE   Pharmacy Consult for TPN Indication: gastric perforation with leak on the UGI  Patient Measurements: Height: 5\' 1"  (154.9 cm) Weight: 133 lb 2.5 oz (60.4 kg) IBW/kg (Calculated) : 47.8 TPN AdjBW (KG): 51 Body mass index is 25.16 kg/m.   Assessment: Pharmacy consulted to dose TPN in a patient with gastric perforation with leak on UGI.  GI: famotidine 20 mg BID for reflux- will replace in TPN Endo: non-diabetic patient with stable glucose levels Insulin requirements in the past 24 hours: N/A Lytes: K: 3.4    Phos: 1.2-  Both replaced this morning  Ca: 7.8 Renal:  Scr: 0.39 Pulm: Cards:  Hepatobil: Labs normal Neuro: ID: Zosyn 3.375g IV every 8 hours for elevated WBC   TPN Access: Central line to be placed this afternoon TPN start date: 03/19/18 Nutritional Goals (per RD recommendation on 8/5): KCal: 1300-1440 Protein: 70-75 Fluid: >1400 mL daily  Goal TPN rate is 55 ml/hr (provides 1417 kcal/day)  Current Nutrition: Patient admitted to hospital 8/4 where she had regular home diet prior to admission.  She has been NPO since admission.  Plan:  Initiate TPN at 40 mL/hr. This TPN provides 48 g of protein, 144 g of dextrose, and 48 g of lipids which provides 1162 kCals per day, meeting ~80% of patient needs Electrolytes in TPN: Clinimix 5/15 with added electrolytes Add MVI, trace elements, and famotidine 40 mg to TPN  Adjust IVMF D5 1/2NS KCl 44mEq/L to 35 ml/hr once TPN is started Monitor TPN labs   Margot Ables, PharmD Clinical Pharmacist 03/19/2018 12:26 PM

## 2018-03-19 NOTE — Progress Notes (Signed)
1420Peripherally Inserted Central Catheter/Midline Placement  The IV Nurse has discussed with the patient and/or persons authorized to consent for the patient, the purpose of this procedure and the potential benefits and risks involved with this procedure.  The benefits include less needle sticks, lab draws from the catheter, and the patient may be discharged home with the catheter. Risks include, but not limited to, infection, bleeding, blood clot (thrombus formation), and puncture of an artery; nerve damage and irregular heartbeat and possibility to perform a PICC exchange if needed/ordered by physician.  Alternatives to this procedure were also discussed.  Bard Power PICC patient education guide, fact sheet on infection prevention and patient information card has been provided to patient /or left at bedside.    PICC/Midline Placement Documentation  PICC Double Lumen 03/19/18 PICC Right Brachial 36 cm 1 cm (Active)  Indication for Insertion or Continuance of Line Administration of hyperosmolar/irritating solutions (i.e. TPN, Vancomycin, etc.) 03/19/2018  2:00 PM  Exposed Catheter (cm) 1 cm 03/19/2018  2:00 PM  Site Assessment Clean;Dry;Intact 03/19/2018  2:00 PM  Lumen #1 Status Flushed;Blood return noted 03/19/2018  2:00 PM  Lumen #2 Status Flushed;Blood return noted 03/19/2018  2:00 PM  Dressing Type Transparent 03/19/2018  2:00 PM  Dressing Status Clean;Dry;Intact;Antimicrobial disc in place 03/19/2018  2:00 PM  Dressing Intervention New dressing 03/19/2018  2:00 PM  Dressing Change Due 03/26/18 03/19/2018  2:00 PM       Jule Economy Horton 03/19/2018, 2:49 PM

## 2018-03-20 LAB — CBC WITH DIFFERENTIAL/PLATELET
Basophils Absolute: 0 10*3/uL (ref 0.0–0.1)
Basophils Relative: 0 %
EOS PCT: 3 %
Eosinophils Absolute: 0.3 10*3/uL (ref 0.0–0.7)
HCT: 34.2 % — ABNORMAL LOW (ref 36.0–46.0)
Hemoglobin: 11.6 g/dL — ABNORMAL LOW (ref 12.0–15.0)
LYMPHS ABS: 1.2 10*3/uL (ref 0.7–4.0)
LYMPHS PCT: 11 %
MCH: 33.2 pg (ref 26.0–34.0)
MCHC: 33.9 g/dL (ref 30.0–36.0)
MCV: 98 fL (ref 78.0–100.0)
MONO ABS: 0.3 10*3/uL (ref 0.1–1.0)
MONOS PCT: 3 %
Neutro Abs: 8.9 10*3/uL — ABNORMAL HIGH (ref 1.7–7.7)
Neutrophils Relative %: 83 %
Platelets: 340 10*3/uL (ref 150–400)
RBC: 3.49 MIL/uL — AB (ref 3.87–5.11)
RDW: 13.4 % (ref 11.5–15.5)
WBC: 10.7 10*3/uL — AB (ref 4.0–10.5)

## 2018-03-20 LAB — COMPREHENSIVE METABOLIC PANEL
ALBUMIN: 2.4 g/dL — AB (ref 3.5–5.0)
ALK PHOS: 56 U/L (ref 38–126)
ALT: 11 U/L (ref 0–44)
ANION GAP: 6 (ref 5–15)
AST: 15 U/L (ref 15–41)
BILIRUBIN TOTAL: 0.4 mg/dL (ref 0.3–1.2)
BUN: 5 mg/dL — AB (ref 6–20)
CALCIUM: 7.8 mg/dL — AB (ref 8.9–10.3)
CO2: 28 mmol/L (ref 22–32)
Chloride: 102 mmol/L (ref 98–111)
Creatinine, Ser: 0.42 mg/dL — ABNORMAL LOW (ref 0.44–1.00)
GFR calc Af Amer: >60 mL/min (ref >60)
GFR calc non Af Amer: >60 mL/min (ref >60)
GLUCOSE: 129 mg/dL — AB (ref 70–99)
Potassium: 2.8 mmol/L — ABNORMAL LOW (ref 3.5–5.1)
Sodium: 136 mmol/L (ref 135–145)
TOTAL PROTEIN: 6.1 g/dL — AB (ref 6.5–8.1)

## 2018-03-20 LAB — MAGNESIUM: Magnesium: 1.9 mg/dL (ref 1.7–2.4)

## 2018-03-20 LAB — GLUCOSE, CAPILLARY
Glucose-Capillary: 118 mg/dL — ABNORMAL HIGH (ref 70–99)
Glucose-Capillary: 144 mg/dL — ABNORMAL HIGH (ref 70–99)

## 2018-03-20 LAB — PREALBUMIN: Prealbumin: 7.9 mg/dL — ABNORMAL LOW (ref 18–38)

## 2018-03-20 LAB — TRIGLYCERIDES: Triglycerides: 108 mg/dL (ref <150)

## 2018-03-20 LAB — PHOSPHORUS: Phosphorus: 2 mg/dL — ABNORMAL LOW (ref 2.5–4.6)

## 2018-03-20 LAB — TROPONIN I: Troponin I: 0.03 ng/mL (ref <0.03)

## 2018-03-20 MED ORDER — PANTOPRAZOLE SODIUM 40 MG IV SOLR
40.0000 mg | Freq: Two times a day (BID) | INTRAVENOUS | Status: DC
  Administered 2018-03-20 – 2018-03-25 (×12): 40 mg via INTRAVENOUS
  Filled 2018-03-20 (×12): qty 40

## 2018-03-20 MED ORDER — PROMETHAZINE HCL 25 MG/ML IJ SOLN: 12.5000 mg | Freq: Four times a day (QID) | INTRAMUSCULAR | Status: DC | PRN

## 2018-03-20 MED ORDER — TRACE MINERALS CR-CU-MN-SE-ZN 10-1000-500-60 MCG/ML IV SOLN
60.0000 mL/h | INTRAVENOUS | Status: AC
  Administered 2018-03-20: 18:00:00 via INTRAVENOUS
  Filled 2018-03-20: qty 1440

## 2018-03-20 MED ORDER — INSULIN ASPART 100 UNIT/ML ~~LOC~~ SOLN
0.0000 [IU] | Freq: Four times a day (QID) | SUBCUTANEOUS | Status: DC
  Administered 2018-03-20: 1 [IU] via SUBCUTANEOUS

## 2018-03-20 MED ORDER — POTASSIUM PHOSPHATES 15 MMOLE/5ML IV SOLN
20.0000 mmol | Freq: Once | INTRAVENOUS | Status: AC
  Administered 2018-03-20: 20 mmol via INTRAVENOUS
  Filled 2018-03-20: qty 6.67

## 2018-03-20 MED ORDER — ONDANSETRON HCL 4 MG/2ML IJ SOLN
4.0000 mg | Freq: Four times a day (QID) | INTRAMUSCULAR | Status: DC
  Administered 2018-03-20 – 2018-03-26 (×24): 4 mg via INTRAVENOUS
  Filled 2018-03-20 (×24): qty 2

## 2018-03-20 MED ORDER — TRACE MINERALS CR-CU-MN-SE-ZN 10-1000-500-60 MCG/ML IV SOLN: INTRAVENOUS | Status: DC

## 2018-03-20 MED ORDER — FAT EMULSION PLANT BASED 20 % IV EMUL
250.0000 mL | INTRAVENOUS | Status: AC
  Administered 2018-03-20: 250 mL via INTRAVENOUS
  Filled 2018-03-20: qty 250

## 2018-03-20 MED ORDER — TRACE MINERALS CR-CU-MN-SE-ZN 10-1000-500-60 MCG/ML IV SOLN
60.0000 mL/h | INTRAVENOUS | Status: DC
  Filled 2018-03-20: qty 1440

## 2018-03-20 MED ORDER — KCL IN DEXTROSE-NACL 20-5-0.45 MEQ/L-%-% IV SOLN
INTRAVENOUS | Status: DC
  Administered 2018-03-20 – 2018-03-24 (×2): via INTRAVENOUS

## 2018-03-20 MED ORDER — FAT EMULSION PLANT BASED 20 % IV EMUL
250.0000 mL | INTRAVENOUS | Status: DC
  Filled 2018-03-20: qty 250

## 2018-03-20 MED ORDER — POTASSIUM CHLORIDE 10 MEQ/100ML IV SOLN
10.0000 meq | INTRAVENOUS | Status: AC
  Administered 2018-03-20 (×4): 10 meq via INTRAVENOUS
  Filled 2018-03-20 (×4): qty 100

## 2018-03-20 NOTE — Plan of Care (Signed)
  Problem: Acute Rehab PT Goals(only PT should resolve) Goal: Pt Will Go Supine/Side To Sit Outcome: Progressing Flowsheets (Taken 03/20/2018 1516) Pt will go Supine/Side to Sit: with supervision Goal: Patient Will Transfer Sit To/From Stand Outcome: Progressing Flowsheets (Taken 03/20/2018 1516) Patient will transfer sit to/from stand: with min guard assist Goal: Pt Will Transfer Bed To Chair/Chair To Bed Outcome: Progressing Flowsheets (Taken 03/20/2018 1516) Pt will Transfer Bed to Chair/Chair to Bed: min guard assist   3:17 PM, 03/20/18 Lonell Grandchild, MPT Physical Therapist with [facility] [phone] office [phone] mobile phone

## 2018-03-20 NOTE — Progress Notes (Signed)
CRITICAL VALUE ALERT  Critical Value:  Troponin 0.03  Date & Time Notied:  03/20/2018 1323  Provider Notified: Dr. Constance Haw  Orders Received/Actions taken: Awaiting call back/orders

## 2018-03-20 NOTE — Progress Notes (Signed)
[facility] Progress Note  3 Days Post-Op  Subjective: No major issues. Worked with PT and did well today. Recommending some HH PT at discharge. Patient otherwise ok but says having some pain at incision and pain in the epigastric/ chest region. Difficult to express quality, but sounds like reflux more than anything. No cardiac history.   Objective: Vital signs in last 24 hours: Temp:  [98.3 F (36.8 C)-98.6 F (37 C)] 98.6 F (37 C) (08/07 0550) Pulse Rate:  [101-105] 104 (08/07 0551) Resp:  [17-18] 18 (08/07 0551) BP: (145-151)/(97-104) 147/102 (08/07 0551) SpO2:  [91 %-94 %] 91 % (08/07 0551) Last BM Date: 03/20/18  Intake/Output from previous day: 08/06 0701 - 08/07 0700 In: 1519.6 [P.O.:240; I.V.:535.8; IV Piggyback:743.9] Out: 2200 [Urine:2200] Intake/Output this shift: No intake/output data recorded.  General appearance: alert, cooperative and no distress Resp: normal work breathing GI: soft, tender around incision, no rebound or guarding, honeycomb without staining, staples clean  Lab Results:  Recent Labs    03/19/18 0545 03/20/18 0718  WBC 11.4* 10.7*  HGB 11.5* 11.6*  HCT 34.4* 34.2*  PLT 379 340   BMET Recent Labs    03/19/18 0545 03/20/18 0718  NA 137 136  K 3.4* 2.8*  CL 109 102  CO2 23 28  GLUCOSE 86 129*  BUN 17 5*  CREATININE 0.39* 0.42*  CALCIUM 7.8* 7.8*   Studies/Results: Dg Ugi W/water Sol Cm  Result Date: 03/19/2018 CLINICAL DATA:  Perforated gastric ulcer post repair on 03/17/2018 EXAM: WATER SOLUBLE UPPER GI SERIES TECHNIQUE: Single-column upper GI series was performed using water soluble contrast. CONTRAST:  125 mL Isovue-300 IV COMPARISON:  CT abdomen and pelvis 03/17/2018 FLUOROSCOPY TIME:  Fluoroscopy Time:  3 minutes 0 seconds Radiation Exposure Index (if provided by the fluoroscopic device): 43.6 mGy Number of Acquired Spot Images: multiple fluoroscopic screen captures FINDINGS: No esophageal obstruction. Mild  gastroesophageal reflux seen during exam. Stomach distends normally without evidence of outlet obstruction. Normal rugal fold pattern. At the posterior aspect of the gastric antrum, an abnormal collection of extraluminal contrast is identified consistent with perforation, likely at site of gastric perforation identified intraoperatively. This collection was contained and persisted throughout the exam. Duodenal bulb and sweep normal appearance. IMPRESSION: Extravasation of contrast from the posterior wall of the gastric antrum compatible with a contained perforation, likely corresponding to the pre-pyloric ulcer identified posteriorly at time of surgery. Electronically Signed   By: Lavonia Dana M.D.   On: 03/19/2018 10:39    Anti-infectives: Anti-infectives (From admission, onward)   Start     Dose/Rate Route Frequency Ordered Stop   03/17/18 1000  piperacillin-tazobactam (ZOSYN) IVPB 3.375 g     3.375 g 12.5 mL/hr over 240 Minutes Intravenous Every 8 hours 03/17/18 0909     03/17/18 0215  piperacillin-tazobactam (ZOSYN) IVPB 3.375 g     3.375 g 100 mL/hr over 30 Minutes Intravenous  Once 03/17/18 0623 03/17/18 0252      Assessment/Plan: Tiffany Stewart is 47 yo POD 3 s/p Ex lap, gastrorrhaphy and omental patch for gastric perforation with UGI showing posterior contained leak yesterday. Otherwise doing fair. -PRN for pain -HD ok, having some epigastric? Chest pain, ordering EKG and troponin to be safe -NPO, refuses NG, having some nausea/ spitting up, zofran scheduled, phenergan PRN, having BMs, will monitor -Repeat UGI Monday  -TPN ordered, Protonix starting today  -Lyte replacement being done by Pharmacy  -SCDs, lovenox  -Working with PT, recommending HHPT    LOS:  3 days    [name] 03/20/2018

## 2018-03-20 NOTE — Progress Notes (Addendum)
**Note De-identified  Obfuscation** EKG complete RN notified  and placed in patient chart.  

## 2018-03-20 NOTE — Progress Notes (Signed)
PHARMACY - ADULT TOTAL PARENTERAL NUTRITION CONSULT NOTE   Pharmacy Consult for TPN Indication: gastric perforation with leak on the UGI  Patient Measurements: Height: 5\' 1"  (154.9 cm) Weight: 133 lb 2.5 oz (60.4 kg) IBW/kg (Calculated) : 47.8 TPN AdjBW (KG): 51 Body mass index is 25.16 kg/m.   Assessment: Pharmacy consulted to dose TPN in a patient with gastric perforation with leak on UGI.  GI:  Endo: non-diabetic patient with stable glucose levels Insulin requirements in the past 24 hours: N/A Lytes: K: 2.8    Phos: 2.0  Replacing in addition to TPN Ca: 7.8 Pulm: Cards:  Hepatobil: Labs normal Neuro: ID: Zosyn 3.375g IV every 8 hours for elevated WBC   TPN Access: PICC line TPN start date: 03/19/18 Nutritional Goals (per RD recommendation on 8/5): KCal: 1300-1440 Protein: 70-75 Fluid: >1400 mL daily  Goal TPN rate is 55 ml/hr (provides 1417 kcal/day)  Current Nutrition/Assessment: Patient admitted to hospital 8/4 where she had regular home diet prior to admission.  She has been NPO since admission. Gastric perforation with repair and anticipated long NPO status. Patient tolerating TPN, BS stable--> will advance.  Plan:  Increase TPN at 60 mL/hr. This TPN provides 72 g of protein, 211 g(734kcal) of dextrose, and 480kcal (48gm) of lipids which provides 1502 kCals per day, meeting ~100% of patient needs Electrolytes in TPN: Clinimix 5/15 with added electrolytes Add MVI, trace elements Boluses of KCL x 4 runs, followed by additional KPhos 49mmol x 1  Adjust IVMF D5 1/2NS KCl 58mEq/Lfor total IVF rate of 59mls/min Monitor TPN labs  Isac Sarna, BS [place], [place] Clinical Pharmacist Pager [phone] 03/20/2018 8:49 AM

## 2018-03-20 NOTE — Evaluation (Signed)
Physical Therapy Evaluation Patient Details Name: Tiffany Stewart MRN: [id number] DOB: [birth year] Today's Date: 03/20/2018   History of Present Illness  Patient is a 47 year old white female who was brought to the emergency room by family member for worsening abdominal pain.  She states the pain came suddenly upon her.  CT scan of the abdomen revealed perforated viscus most likely secondary to a perforated peptic ulcer.  Patient states she has been taking Goody powders to treat a headache.  She has a history of chronic pain, anxiety, and hereditary spastic paraplegia.  She describes her pain as 10 out of 10    Clinical Impression  Patient requires assistance to move BLE's when sitting up at bedside and when getting back in to bed, demonstrates fair/good return for stand pivot bed<>chair transfers using armrest of w/c, bedrail, attempted to transfer to commode in bathroom, but unable to due to feet sliding, stayed up in wheelchair for approximately 15 minutes, but requested to go back to bed due to c/o fatigue and abdominal pain.  Patient will benefit from continued physical therapy in hospital and recommended venue below to increase strength, balance, endurance for safe ADLs and gait.    Follow Up Recommendations Supervision for mobility/OOB;Supervision/Assistance - 24 hour;Home health PT    Equipment Recommendations  None recommended by PT    Recommendations for Other Services       Precautions / Restrictions Precautions Precautions: Fall Precaution Comments: paraplegic Restrictions Weight Bearing Restrictions: No      Mobility  Bed Mobility Overal bed mobility: Needs Assistance Bed Mobility: Supine to Sit;Sit to Supine     Supine to sit: Min assist Sit to supine: Min assist   General bed mobility comments: slow labored movement  Transfers Overall transfer level: Needs assistance Equipment used: 1 person hand held assist Transfers: Sit to/from [employer] Sit  to Stand: Min assist;Mod assist Stand pivot transfers: Min assist;Mod assist       General transfer comment: using armrest of wheelchair for stand pivot transfers  Ambulation/Gait                Hotel manager mobility: Yes Wheelchair propulsion: Both upper extremities Wheelchair parts: Supervision/cueing Distance: Orthoptist Details (indicate cue type and reason): required assistance to turn wheelchair (w/c) inside of bathroom, otherwise patient demonstrates good return for propelling self in w/c  Modified Rankin (Stroke Patients Only)       Balance Overall balance assessment: Needs assistance Sitting-balance support: Feet supported;Bilateral upper extremity supported Sitting balance-Leahy Scale: Good     Standing balance support: Bilateral upper extremity supported;During functional activity;Single extremity supported Standing balance-Leahy Scale: Poor Standing balance comment: fair/poor leaning on armrests of wheelchair with BUE, pulling self up with bed rail using single UE                             Pertinent Vitals/Pain Pain Assessment: Faces Faces Pain Scale: Hurts little more Pain Location: abdomen at surgical site and IV insertions in arms Pain Descriptors / Indicators: Aching;Sore;Discomfort Pain Intervention(s): Limited activity within patient's tolerance;Monitored during session    Home Living Family/patient expects to be discharged to:: Private residence Living Arrangements: Other relatives Available Help at Discharge: Family Type of Home: House Home Access: Stairs to enter   [employer] of Steps: 1(family lifts patient in wheelchair to get into house) Home  Layout: One level Home Equipment: Wheelchair - manual;Shower seat;Hospital bed      Prior Function Level of Independence: Needs assistance   Gait / Transfers Assistance Needed: wheelchair  for mobility, stand pivot transfers mod independent  ADL's / Homemaking Assistance Needed: family assist        Hand Dominance        Extremity/Trunk Assessment   Upper Extremity Assessment Upper Extremity Assessment: Generalized weakness    Lower Extremity Assessment Lower Extremity Assessment: Generalized weakness;RLE deficits/detail;LLE deficits/detail RLE Deficits / Details: grossly 3+/5 except ankle/foot 1/5 LLE Deficits / Details: grossly 3+/5 except ankle/foot 1/5    Cervical / Trunk Assessment Cervical / Trunk Assessment: Kyphotic  Communication   Communication: No difficulties  Cognition Arousal/Alertness: Awake/alert Behavior During Therapy: WFL for tasks assessed/performed Overall Cognitive Status: Within Functional Limits for tasks assessed                                        General Comments      Exercises     Assessment/Plan    PT Assessment Patient needs continued PT services  PT Problem List Decreased strength;Decreased activity tolerance;Decreased balance;Decreased mobility       PT Treatment Interventions Functional mobility training;Therapeutic activities;Therapeutic exercise;Wheelchair mobility training;Patient/family education    PT Goals (Current goals can be found in the Care Plan section)  Acute Rehab PT Goals Patient Stated Goal: return home with family to assist PT Goal Formulation: With patient Time For Goal Achievement: 04/03/18 Potential to Achieve Goals: Good    Frequency Min 3X/week   Barriers to discharge        Co-evaluation               AM-PAC PT "6 Clicks" Daily Activity  Outcome Measure Difficulty turning over in bed (including adjusting bedclothes, sheets and blankets)?: A Little Difficulty moving from lying on back to sitting on the side of the bed? : A Little Difficulty sitting down on and standing up from a chair with arms (e.g., wheelchair, bedside commode, etc,.)?: A Lot Help needed  moving to and from a bed to chair (including a wheelchair)?: A Lot Help needed walking in hospital room?: Total Help needed climbing 3-5 steps with a railing? : Total 6 Click Score: 12    End of Session   Activity Tolerance: Patient tolerated treatment well;Patient limited by fatigue Patient left: in bed;with call bell/phone within reach Nurse Communication: Mobility status PT Visit Diagnosis: Unsteadiness on feet (R26.81);Other abnormalities of gait and mobility (R26.89);Muscle weakness (generalized) (M62.81)    Time: 3212-2482 PT Time Calculation (min) (ACUTE ONLY): 27 min   Charges:   PT Evaluation $PT Eval Moderate Complexity: 1 Mod PT Treatments $Therapeutic Activity: 23-37 mins        3:15 PM, 03/20/18 Lonell Grandchild, MPT Physical Therapist with [facility] 336 [phone] office [phone] mobile phone

## 2018-03-20 NOTE — Plan of Care (Signed)
Resting quietly with eyes closed,no visual distress noted.

## 2018-03-20 NOTE — Progress Notes (Addendum)
Nutrition Follow-up  DOCUMENTATION CODES:  Not applicable  INTERVENTION:  TPN per pharmacy   Should include a parenteral mvi +trace elements in bag.   Kcal/protein recommendations updated  NUTRITION DIAGNOSIS:  Inadequate oral intake related to inability to eat(Due to gastric perforation) as evidenced by NPO status(Initiation of TPN).  GOAL:  Patient will meet greater than or equal to 90% of their needs  MONITOR:  Diet advancement, Labs, Weight trends, I & O's  REASON FOR ASSESSMENT:  Consult New TPN/TNA  ASSESSMENT:  47 y/o female PMHx w/ PMHx anxiety, depression gerd, spastic paraplegia, chronic pain. Brought to ED due to abrupt abdominal pain. CT revealed gastric performation s/p omental patching 8/4. Post op day 1, had UGI that showed residual leak. Pt made NPO w/ c/s for TPN 8/6.   Patient's TPN to be advanced to goal rate today.  Re obtained bed weight, still 133 lbs. Question validity of bed scale as patient says she weighed herself at home relatively recently and it was close to 100 lbs. Will continue to dose off of IBW to prevent overfeeding  She says she has been nauseated all night. Still refusing NG tube.   Surgery Plans to hold off repeat UGI until Monday.   Patient notes she is bed bound at baseline.   Pharmacy giving kphos to replete  Labs: K:3.4-> 2.8, Glu: 129, WBC:10.7, Albumin: 2.4, Phos 1.2->2.0,  Meds: ppi, Zofran, TPN, IV abx, IVF, Prn fentanyl  Recent Labs  Lab 03/18/18 0546 03/19/18 0545 03/20/18 0718  NA 139 137 136  K 2.7* 3.4* 2.8*  CL 110 109 102  CO2 20* 23 28  BUN 30* 17 5*  CREATININE 0.58 0.39* 0.42*  CALCIUM 7.7* 7.8* 7.8*  MG 1.9 2.1 1.9  PHOS 2.6 1.2* 2.0*  GLUCOSE 66* 86 129*   Diet Order:   Diet Order           Diet NPO time specified Except for: Ice Chips  Diet effective now         EDUCATION NEEDS:  No education needs have been identified at this time  Skin:  Abdominal surgical incision  Last BM:   8/7  Height:  Ht Readings from Last 1 Encounters:  03/17/18 5\' 1"  (1.549 m)   Weight:  Wt Readings from Last 1 Encounters:  03/17/18 133 lb 2.5 oz (60.4 kg)   Wt Readings from Last 10 Encounters:  03/17/18 133 lb 2.5 oz (60.4 kg)  05/03/17 105 lb 6.1 oz (47.8 kg)  02/09/17 98 lb 9.6 oz (44.7 kg)  11/14/16 96 lb (43.5 kg)  10/29/16 130 lb (59 kg)  02/07/15 130 lb (59 kg)  05/10/12 135 lb (61.2 kg)   Ideal Body Weight:  48 kg  BMI:  Body mass index is 25.16 kg/m.  Estimated Nutritional Needs:  Kcal:  1500-1680 kcals (31-35 kcals/kg ibw) Protein:  75-85g Pro (20% of kcals) Fluid:  >1.4 L fluid (30 ml/kg ibw)  Burtis Junes RD, LDN, CNSC Clinical Nutrition Available Tues-Sat via Pager: [phone] 03/20/2018 1:57 PM

## 2018-03-21 LAB — COMPREHENSIVE METABOLIC PANEL
ALT: 12 U/L (ref 0–44)
AST: 16 U/L (ref 15–41)
Albumin: 2.3 g/dL — ABNORMAL LOW (ref 3.5–5.0)
Alkaline Phosphatase: 65 U/L (ref 38–126)
Anion gap: 7 (ref 5–15)
BILIRUBIN TOTAL: 0.3 mg/dL (ref 0.3–1.2)
BUN: 8 mg/dL (ref 6–20)
CO2: 26 mmol/L (ref 22–32)
Calcium: 7.9 mg/dL — ABNORMAL LOW (ref 8.9–10.3)
Chloride: 103 mmol/L (ref 98–111)
Creatinine, Ser: 0.37 mg/dL — ABNORMAL LOW (ref 0.44–1.00)
GFR calc non Af Amer: >60 mL/min (ref >60)
Glucose, Bld: 134 mg/dL — ABNORMAL HIGH (ref 70–99)
POTASSIUM: 3.2 mmol/L — AB (ref 3.5–5.1)
Sodium: 136 mmol/L (ref 135–145)
TOTAL PROTEIN: 6.1 g/dL — AB (ref 6.5–8.1)

## 2018-03-21 LAB — CBC WITH DIFFERENTIAL/PLATELET
Basophils Absolute: 0 10*3/uL (ref 0.0–0.1)
Basophils Relative: 0 %
EOS ABS: 0.4 10*3/uL (ref 0.0–0.7)
Eosinophils Relative: 4 %
HCT: 35.5 % — ABNORMAL LOW (ref 36.0–46.0)
Hemoglobin: 11.6 g/dL — ABNORMAL LOW (ref 12.0–15.0)
LYMPHS ABS: 1.8 10*3/uL (ref 0.7–4.0)
Lymphocytes Relative: 17 %
MCH: 32.3 pg (ref 26.0–34.0)
MCHC: 32.7 g/dL (ref 30.0–36.0)
MCV: 98.9 fL (ref 78.0–100.0)
MONO ABS: 0.5 10*3/uL (ref 0.1–1.0)
Monocytes Relative: 5 %
Neutro Abs: 8.1 10*3/uL — ABNORMAL HIGH (ref 1.7–7.7)
Neutrophils Relative %: 74 %
Platelets: 392 10*3/uL (ref 150–400)
RBC: 3.59 MIL/uL — ABNORMAL LOW (ref 3.87–5.11)
RDW: 13.6 % (ref 11.5–15.5)
WBC: 10.8 10*3/uL — ABNORMAL HIGH (ref 4.0–10.5)

## 2018-03-21 LAB — H. PYLORI ANTIGEN, STOOL: H. PYLORI STOOL AG, EIA: NEGATIVE

## 2018-03-21 LAB — GLUCOSE, CAPILLARY
GLUCOSE-CAPILLARY: 110 mg/dL — AB (ref 70–99)
Glucose-Capillary: 132 mg/dL — ABNORMAL HIGH (ref 70–99)
Glucose-Capillary: 109 mg/dL — ABNORMAL HIGH (ref 70–99)

## 2018-03-21 LAB — MAGNESIUM: MAGNESIUM: 2 mg/dL (ref 1.7–2.4)

## 2018-03-21 LAB — PHOSPHORUS: Phosphorus: 3.4 mg/dL (ref 2.5–4.6)

## 2018-03-21 MED ORDER — TRACE MINERALS CR-CU-MN-SE-ZN 10-1000-500-60 MCG/ML IV SOLN
INTRAVENOUS | Status: AC
  Administered 2018-03-21: 18:00:00 via INTRAVENOUS
  Filled 2018-03-21: qty 1560

## 2018-03-21 MED ORDER — POTASSIUM CHLORIDE 10 MEQ/100ML IV SOLN
10.0000 meq | INTRAVENOUS | Status: AC
  Administered 2018-03-21 (×4): 10 meq via INTRAVENOUS
  Filled 2018-03-21 (×4): qty 100

## 2018-03-21 MED ORDER — OXYCODONE HCL 5 MG/5ML PO SOLN
5.0000 mg | ORAL | Status: DC | PRN
  Administered 2018-03-21 – 2018-03-27 (×10): 5 mg via ORAL
  Filled 2018-03-21 (×11): qty 5

## 2018-03-21 MED ORDER — MUPIROCIN CALCIUM 2 % EX CREA
TOPICAL_CREAM | Freq: Every day | CUTANEOUS | Status: DC
  Administered 2018-03-21 – 2018-03-26 (×6): via TOPICAL
  Filled 2018-03-21: qty 15

## 2018-03-21 MED ORDER — FAT EMULSION PLANT BASED 20 % IV EMUL
250.0000 mL | INTRAVENOUS | Status: AC
  Administered 2018-03-21: 250 mL via INTRAVENOUS
  Filled 2018-03-21: qty 250

## 2018-03-21 MED ORDER — BACLOFEN 1 MG/ML ORAL SUSPENSION
20.0000 mg | Freq: Two times a day (BID) | ORAL | Status: DC
  Administered 2018-03-21 – 2018-03-22 (×2): 20 mg via ORAL
  Filled 2018-03-21 (×9): qty 2

## 2018-03-21 NOTE — Progress Notes (Addendum)
PHARMACY - ADULT TOTAL PARENTERAL NUTRITION CONSULT NOTE   Pharmacy Consult for TPN Indication: gastric perforation with leak on the UGI  Patient Measurements: Height: 5\' 1"  (154.9 cm) Weight: 133 lb 2.5 oz (60.4 kg) IBW/kg (Calculated) : 47.8 TPN AdjBW (KG): 51 Body mass index is 25.16 kg/m.   Assessment: Pharmacy consulted to dose TPN in a patient with gastric perforation with leak on UGI.  GI:  Endo: non-diabetic patient with  glucose levels trending up over last two days (86>>129>>134) Insulin requirements in the past 24 hours: received 1 units of insulin aspart at 2230 on 8/7 Lytes: K: 3.2    Phos: 3.4  (Replacing 4x20meq of KCl today,  in addition to TPN) Ca: 7.9 Pulm: Cards:  Hepatobil: Labs normal Neuro: ID: Zosyn 3.375g IV q8h ( for elevated WBC)   TPN Access: PICC line TPN start date: 03/19/18 Nutritional Goals (per RD recommendation on 8/7: KCal: 1500-1680 Protein: 75-85g Fluid: >1400 mL daily   Goal TPN rate is 65 ml/hr (provides 1587.6 kcal/day)  Current Nutrition/Assessment: Patient admitted to hospital 8/4 where she had regular home diet prior to admission.  She has been NPO since admission. Gastric perforation with repair and anticipated long NPO status. Patient tolerating TPN, BS stable--> will advance.  Plan:  Increase  TPN rate to  65 mL/hr. This TPN provides 78g of protein, 234 g(796kcal) of dextrose, and 480kcal (53.3gm) of lipids which provides 1587.6 kCals per day, meeting ~100% of patient needs Electrolytes in TPN: Clinimix 5/15 with added electrolytes Add MVI, trace elements Boluses of KCL x 4 runs for K 3.2 today  Adjust MIVF  D5 1/2NS KCl 70mEq/Lfor total IVF rate of 71mls/hr Monitor TPN labs  Despina Pole, Pharm. D. Clinical Pharmacist 03/21/2018 11:12 AM

## 2018-03-21 NOTE — Plan of Care (Signed)
Sitting up bed watching tele,no visual distress noted at present,callbell within reach.

## 2018-03-21 NOTE — Consult Note (Addendum)
[place] Nurse wound consult note Reason for Consult: Consult requested for several wounds which occurred during a fall prior to admission, according to patient. Consult performed using remote camera with assistance from the bedside nurse for wound assessment. Wound type: Left outer ankle with stage 2 pressure injury; approx 3X3X.1cm, red and dry Left foot with with stage 2 pressure injury; approx 2X2X.1cm, red and dry Left knee with partial thickness abrasion; approx 2X2X.1cm, pink and dry Left thigh with generalized bruising, no open wound or drainage; approx 6X6cm Pressure Injury POA: Yes Dressing procedure/placement/frequency: Foam dressing to protect and promote healing. Bactroban to promote moist healing. Discussed plan of care with patient. Please re-consult if further assistance is needed.  Thank-you,  Julien Girt MSN, [place], [place], [place], [place]

## 2018-03-21 NOTE — Progress Notes (Signed)
[facility] Progress Note  4 Days Post-Op  Subjective: Doing fair. A lot of muscle spasms bc not taking baclofen. Worked with PT. Wants to eat and had to explain again reason behind not eating.   Objective: Vital signs in last 24 hours: Temp:  [98.5 F (36.9 C)-98.8 F (37.1 C)] 98.5 F (36.9 C) (08/08 0601) Pulse Rate:  [100-119] 100 (08/08 0601) Resp:  [18] 18 (08/08 0601) BP: (139-153)/(99-119) 139/99 (08/08 0601) SpO2:  [93 %-98 %] 94 % (08/08 0601) Last BM Date: 03/20/18  Intake/Output from previous day: 08/07 0701 - 08/08 0700 In: 1656.9 [I.V.:1122.5; IV Piggyback:534.4] Out: 1000 [Urine:1000] Intake/Output this shift: No intake/output data recorded.  General appearance: alert, cooperative and no distress Resp: normal work breathing GI: soft, tender around incision, honeycomb dressing clean and in place, no erythema or drainage  Some bruising on left hip and knee, heel pressure dressing in place  Lab Results:  Recent Labs    03/20/18 0718 03/21/18 0538  WBC 10.7* 10.8*  HGB 11.6* 11.6*  HCT 34.2* 35.5*  PLT 340 392   BMET Recent Labs    03/20/18 0718 03/21/18 0538  NA 136 136  K 2.8* 3.2*  CL 102 103  CO2 28 26  GLUCOSE 129* 134*  BUN 5* 8  CREATININE 0.42* 0.37*  CALCIUM 7.8* 7.9*   Anti-infectives: Anti-infectives (From admission, onward)   Start     Dose/Rate Route Frequency Ordered Stop   03/17/18 1000  piperacillin-tazobactam (ZOSYN) IVPB 3.375 g     3.375 g 12.5 mL/hr over 240 Minutes Intravenous Every 8 hours 03/17/18 0909     03/17/18 0215  piperacillin-tazobactam (ZOSYN) IVPB 3.375 g     3.375 g 100 mL/hr over 30 Minutes Intravenous  Once 03/17/18 8416 03/17/18 0252      Assessment/Plan: Tiffany Stewart is a 47 yo POD 4 s/p Ex lap, gastrorrhaphy and omental patch for perforated ulcer. UGI with small contained leak posteriorly.  PRN For pain- added roxi liquid PRN and baclofen liquid PRN  HD ok, EKG without ST changes and  troponin negative yesterday, was likely reflux symptoms -NPO, refuses NG, ice ok and sip with meds, nausea meds -Repeat UGI Monday -TPN, Protonix BID -Lytes with TPN -SCDs, lovenox -PT and Wound RN ordered   LOS: 4 days    [name] 03/21/2018

## 2018-03-22 ENCOUNTER — Inpatient Hospital Stay (HOSPITAL_COMMUNITY): Payer: Medicare Other

## 2018-03-22 DIAGNOSIS — K255 Chronic or unspecified gastric ulcer with perforation: Principal | ICD-10-CM

## 2018-03-22 LAB — CBC WITH DIFFERENTIAL/PLATELET
BASOS PCT: 1 %
Basophils Absolute: 0.1 10*3/uL (ref 0.0–0.1)
Eosinophils Absolute: 0.6 10*3/uL (ref 0.0–0.7)
Eosinophils Relative: 5 %
HCT: 36.4 % (ref 36.0–46.0)
Hemoglobin: 12 g/dL (ref 12.0–15.0)
Lymphocytes Relative: 20 %
Lymphs Abs: 2.3 10*3/uL (ref 0.7–4.0)
MCH: 33.2 pg (ref 26.0–34.0)
MCHC: 33 g/dL (ref 30.0–36.0)
MCV: 100.8 fL — AB (ref 78.0–100.0)
MONO ABS: 0.8 10*3/uL (ref 0.1–1.0)
Monocytes Relative: 7 %
NEUTROS PCT: 67 %
Neutro Abs: 7.8 10*3/uL — ABNORMAL HIGH (ref 1.7–7.7)
PLATELETS: 407 10*3/uL — AB (ref 150–400)
RBC: 3.61 MIL/uL — ABNORMAL LOW (ref 3.87–5.11)
RDW: 14 % (ref 11.5–15.5)
WBC: 11.6 10*3/uL — ABNORMAL HIGH (ref 4.0–10.5)

## 2018-03-22 LAB — GLUCOSE, CAPILLARY
GLUCOSE-CAPILLARY: 101 mg/dL — AB (ref 70–99)
GLUCOSE-CAPILLARY: 109 mg/dL — AB (ref 70–99)
Glucose-Capillary: 109 mg/dL — ABNORMAL HIGH (ref 70–99)
Glucose-Capillary: 110 mg/dL — ABNORMAL HIGH (ref 70–99)
Glucose-Capillary: 97 mg/dL (ref 70–99)

## 2018-03-22 LAB — BASIC METABOLIC PANEL
ANION GAP: 8 (ref 5–15)
BUN: 16 mg/dL (ref 6–20)
CO2: 23 mmol/L (ref 22–32)
Calcium: 8.1 mg/dL — ABNORMAL LOW (ref 8.9–10.3)
Chloride: 105 mmol/L (ref 98–111)
Creatinine, Ser: 0.51 mg/dL (ref 0.44–1.00)
GFR calc Af Amer: >60 mL/min (ref >60)
Glucose, Bld: 115 mg/dL — ABNORMAL HIGH (ref 70–99)
POTASSIUM: 4.5 mmol/L (ref 3.5–5.1)
Sodium: 136 mmol/L (ref 135–145)

## 2018-03-22 LAB — PHOSPHORUS: Phosphorus: 4.3 mg/dL (ref 2.5–4.6)

## 2018-03-22 LAB — MAGNESIUM: Magnesium: 2.1 mg/dL (ref 1.7–2.4)

## 2018-03-22 MED ORDER — M.V.I. ADULT IV INJ
INJECTION | INTRAVENOUS | Status: AC
  Administered 2018-03-22: 18:00:00 via INTRAVENOUS
  Filled 2018-03-22 (×2): qty 1560

## 2018-03-22 MED ORDER — FAT EMULSION PLANT BASED 20 % IV EMUL
250.0000 mL | INTRAVENOUS | Status: AC
  Administered 2018-03-22: 250 mL via INTRAVENOUS
  Filled 2018-03-22 (×2): qty 250

## 2018-03-22 MED ORDER — BISACODYL 10 MG RE SUPP: 10.0000 mg | Freq: Every day | RECTAL | Status: DC | PRN

## 2018-03-22 MED ORDER — BACLOFEN 1 MG/ML ORAL SUSPENSION
20.0000 mg | ORAL | Status: DC | PRN
  Administered 2018-03-23 – 2018-03-27 (×7): 20 mg via ORAL
  Filled 2018-03-22 (×8): qty 2

## 2018-03-22 NOTE — Progress Notes (Signed)
[place] CONSULT NOTE   Pharmacy Consult for: TPN Indication: gastric perforation with leak on the UGI  Patient Measurements: Height: 5\' 1"  (154.9 cm) Weight: 133 lb 2.5 oz (60.4 kg) IBW/kg (Calculated) : 47.8 TPN AdjBW (KG): 51 Body mass index is 25.16 kg/m.   Assessment: Pharmacy consulted to dose TPN in a patient with gastric perforation with leak on UGI.  GI:  Endo: non-diabetic patient with  glucose levels  (86>>129>>134>>115) Insulin requirements in the past 24 hours: received 1 units of insulin aspart at 2230 on 8/7 Lytes: K: 4.5   Phos: 4.3  Ca: 8.1 Pulm: Cards:  Hepatobil: Labs normal Neuro: ID: Zosyn 3.375g IV q8h ( for elevated WBC)   TPN Access: PICC line TPN start date: 03/19/18 Nutritional Goals (per RD recommendation on 8/7: KCal: 1500-1680 Protein: 75-85g Fluid: >1400 mL daily   Goal TPN rate is 65 ml/hr (provides 1587.6 kcal/day)  Current Nutrition/Assessment: Patient admitted to hospital 8/4 where she had regular home diet prior to admission.  She has been NPO since admission. Gastric perforation with repair and anticipated long NPO status. Patient tolerating TPN, BS stable--> will advance.  Plan:  Continue TPN rate at  65 mL/hr. This TPN provides 78g of protein, 234 g(796kcal) of dextrose, and 480kcal (53.3gm) of lipids which provides 1587.6 kCals per day, meeting ~100% of patient needs Electrolytes in TPN: Clinimix 5/15 with added electrolytes Add MVI, trace elements   Adjust MIVF  D5 1/2NS KCl 57mEq/Lfor total IVF rate of 83mls/hr Monitor TPN labs  Despina Pole, Pharm. D. Clinical Pharmacist 03/22/2018 11:11 AM

## 2018-03-22 NOTE — Progress Notes (Signed)
[facility] Progress Note  5 Days Post-Op  Subjective: Having some upper abdominal pain. Feels like it is spasms she thinks. Her baclofen has helped some but she reports taking 20 mg every 4-6 hours as needed and as of now I only had 20 mg BID ordered.  She does not like getting her finger stuck for the blood glucose for the TPN and is asking about when this will stop.  She also is very tearful and worried about Chevis Pretty, FNP reaction to the patient being in the hospital. The patient told me she took some extra baclofen when her stomach was hurting and that she was worried the PCP would be upset with her and take her baclofen Rx away.  I tried to calm her anxiety about this and told her I would send Ms. Hassell Done a message to make sure she was aware of the patient's gastric perforation and surgery.  To my knowledge baclofen should have had no cause/ effect on the gastric perforation as it is a muscle relaxant.   Objective: Vital signs in last 24 hours: Temp:  [98.1 F (36.7 C)-99 F (37.2 C)] 99 F (37.2 C) (08/09 1303) Pulse Rate:  [82-99] 95 (08/09 1303) Resp:  [18] 18 (08/09 1303) BP: (128-148)/(96-98) 138/97 (08/09 1303) SpO2:  [95 %-98 %] 97 % (08/09 1303) Last BM Date: 03/20/18  Intake/Output from previous day: 08/08 0701 - 08/09 0700 In: 2276.9 [I.V.:1890.3; IV Piggyback:386.7] Out: 1400 [Urine:1400] Intake/Output this shift: No intake/output data recorded.  General appearance: alert, cooperative and mild distress Resp: normal work breathing GI: soft, appropriately tender, no rebound or guarding, staples c/d/i with no erythema or drainage  Lab Results:  Recent Labs    03/21/18 0538 03/22/18 0434  WBC 10.8* 11.6*  HGB 11.6* 12.0  HCT 35.5* 36.4  PLT 392 407*   BMET Recent Labs    03/21/18 0538 03/22/18 0434  NA 136 136  K 3.2* 4.5  CL 103 105  CO2 26 23  GLUCOSE 134* 115*  BUN 8 16  CREATININE 0.37* 0.51  CALCIUM 7.9* 8.1*     Anti-infectives: Anti-infectives (From admission, onward)   Start     Dose/Rate Route Frequency Ordered Stop   03/17/18 1000  piperacillin-tazobactam (ZOSYN) IVPB 3.375 g     3.375 g 12.5 mL/hr over 240 Minutes Intravenous Every 8 hours 03/17/18 0909     03/17/18 0215  piperacillin-tazobactam (ZOSYN) IVPB 3.375 g     3.375 g 100 mL/hr over 30 Minutes Intravenous  Once 03/17/18 5732 03/17/18 0252      Assessment/Plan: Ms. Bonnette is a 47 yo POD 5 s/p Ex lap, gastrorrhaphy and omental patch for perforated ulcer. UGI with small contained leak posteriorly.   PRN pain meds and baclofen PRN  NPO with sips/ chips and minimal po meds  Repeat UGI Monday TPN, Protonix BID Lytes with TPN, labs ordered for Monday/ Thursday  SCDs, lovenox KUB ordered give abdominal pain  PT Note sent to PCP to explain surgery/ gastric perforation/ baclofen use given patient's anxiety regarding this    LOS: 5 days    [name] 03/22/2018

## 2018-03-22 NOTE — Progress Notes (Signed)
[facility]  Xray reviewed. Bowel gas wnl. Some contrast in rectum,  Possible small RUQ air but only minimal and likely post op.  Will continue to monitor.  Curlene Labrum, MD [facility] [address] [place], [place] [address] [phone] (office)

## 2018-03-22 NOTE — Care Management Important Message (Signed)
Important Message  Patient Details  Name: Tiffany Stewart MRN: [id number] Date of Birth: Dec 15, 1970   Medicare Important Message Given:  Yes Unable to sign.   Holli Humbles Smith 03/22/2018, 12:28 PM

## 2018-03-23 LAB — GLUCOSE, CAPILLARY
GLUCOSE-CAPILLARY: 94 mg/dL (ref 70–99)
Glucose-Capillary: 114 mg/dL — ABNORMAL HIGH (ref 70–99)
Glucose-Capillary: 91 mg/dL (ref 70–99)
Glucose-Capillary: 107 mg/dL — ABNORMAL HIGH (ref 70–99)

## 2018-03-23 MED ORDER — TRACE MINERALS CR-CU-MN-SE-ZN 10-1000-500-60 MCG/ML IV SOLN
INTRAVENOUS | Status: AC
  Administered 2018-03-23: 18:00:00 via INTRAVENOUS
  Filled 2018-03-23: qty 1560

## 2018-03-23 MED ORDER — FAT EMULSION PLANT BASED 20 % IV EMUL
250.0000 mL | INTRAVENOUS | Status: AC
  Administered 2018-03-23: 250 mL via INTRAVENOUS
  Filled 2018-03-23: qty 250

## 2018-03-23 NOTE — Progress Notes (Signed)
[facility] Progress Note  6 Days Post-Op  Subjective: Doing fair. Says her abdomen is better this AM. Had a BM. Eager to have repeat UGI on Monday.   Objective: Vital signs in last 24 hours: Temp:  [98.6 F (37 C)-98.9 F (37.2 C)] 98.9 F (37.2 C) (08/10 0526) Pulse Rate:  [99-114] 99 (08/10 0526) Resp:  [18-20] 18 (08/10 0526) BP: (122-152)/(90-105) 126/95 (08/10 0526) SpO2:  [94 %-95 %] 95 % (08/10 0526) Last BM Date: 03/23/18  Intake/Output from previous day: 08/09 0701 - 08/10 0700 In: 1490.6 [I.V.:1239.6; IV Piggyback:251] Out: 1050 [Urine:1050] Intake/Output this shift: No intake/output data recorded.  General appearance: alert, cooperative and no distress Resp: normal work breathing GI: soft, minimally distended, appropriately tender around incision and staples c/d/i with no erythema or drainage, no rebound or guarding  Lab Results:  Recent Labs    03/21/18 0538 03/22/18 0434  WBC 10.8* 11.6*  HGB 11.6* 12.0  HCT 35.5* 36.4  PLT 392 407*   BMET Recent Labs    03/21/18 0538 03/22/18 0434  NA 136 136  K 3.2* 4.5  CL 103 105  CO2 26 23  GLUCOSE 134* 115*  BUN 8 16  CREATININE 0.37* 0.51  CALCIUM 7.9* 8.1*    Reviewed Xray- some evidence of possible anterior air over the liver, not large amount and exam and vitals reassuring  Studies/Results: Dg Abd 1 View  Result Date: 03/22/2018 CLINICAL DATA:  Recent gastric perforation from a perforated ulcer. Status post exploratory laparotomy and gastric omental patch on 03/17/2018. EXAM: ABDOMEN - 1 VIEW COMPARISON:  05/02/2017. Water-soluble upper GI series dated 03/19/2018. FINDINGS: The oral contrast has reached the rectum. No dilated bowel loops. On 1 of the views, there is a suggestion of anteriorly located free peritoneal air in the right upper abdomen, not seen on the other view centered more inferiorly. Skin clips. Cholecystectomy clips. Unremarkable bones. IMPRESSION: Probable  postoperative free peritoneal air in the right upper abdomen. Otherwise, unremarkable examination. Electronically Signed   By: Claudie Revering M.D.   On: 03/22/2018 17:42   Anti-infectives: Anti-infectives (From admission, onward)   Start     Dose/Rate Route Frequency Ordered Stop   03/17/18 1000  piperacillin-tazobactam (ZOSYN) IVPB 3.375 g     3.375 g 12.5 mL/hr over 240 Minutes Intravenous Every 8 hours 03/17/18 0909     03/17/18 0215  piperacillin-tazobactam (ZOSYN) IVPB 3.375 g     3.375 g 100 mL/hr over 30 Minutes Intravenous  Once 03/17/18 8588 03/17/18 0252      Assessment/Plan: Ms. Dingee is a 47 yo POD 6 s/p Ex lap, gastrorrhaphy and omental patch for perforated ulcer and small contained leak on UGI.  PRN pain meds and baclofen PRN  NPO with sips/ chips and minimal po meds  Repeat UGI Monday, ordered  TPN, Protonix BID Labs ordered for Monday/ Thursday  SCDs, lovenox   LOS: 6 days    [name] 03/23/2018 '

## 2018-03-23 NOTE — Progress Notes (Signed)
[place] CONSULT NOTE   Pharmacy Consult for: TPN Indication: gastric perforation with leak on the UGI  Patient Measurements: Height: 5\' 1"  (154.9 cm) Weight: 133 lb 2.5 oz (60.4 kg) IBW/kg (Calculated) : 47.8 TPN AdjBW (KG): 51 Body mass index is 25.16 kg/m.   Assessment: Pharmacy consulted to dose TPN in a patient with gastric perforation with leak on UGI.  GI:  Endo: non-diabetic patient with  glucose levels  (86>>129>>134>>115) Insulin requirements in the past 24 hours: received 1 units of insulin aspart at 2230 on 8/7 Lytes:8/9 K: 4.5   Phos: 4.3  Ca: 8.1 Pulm: Cards:  Hepatobil: Labs normal Neuro: ID: Zosyn 3.375g IV q8h ( for elevated WBC)   TPN Access: PICC line TPN start date: 03/19/18 Nutritional Goals (per RD recommendation on 8/7: KCal: 1500-1680 Protein: 75-85g Fluid: >1400 mL daily   Goal TPN rate is 65 ml/hr (provides 1587.6 kcal/day)  Current Nutrition/Assessment: Patient admitted to hospital 8/4 where she had regular home diet prior to admission.  She has been NPO since admission. Gastric perforation with repair and anticipated long NPO status. Patient tolerating TPN, BS stable--> will advance.  Plan:  Continue TPN rate at  65 mL/hr. This TPN provides 78g of protein, 234 g(796kcal) of dextrose, and 480kcal (53.3gm) of lipids which provides 1587.6 kCals per day, meeting ~100% of patient needs Electrolytes in TPN: Clinimix 5/15 with added electrolytes Add MVI, trace elements   Adjust MIVF  D5 1/2NS KCl 52mEq/Lfor total IVF rate of 57mls/hr Monitor TPN labs  Thomasenia Sales, PharmD, MBA, BCGP Clinical Pharmacist  03/23/2018 11:14 AM

## 2018-03-24 LAB — GLUCOSE, CAPILLARY
GLUCOSE-CAPILLARY: 104 mg/dL — AB (ref 70–99)
GLUCOSE-CAPILLARY: 135 mg/dL — AB (ref 70–99)
Glucose-Capillary: 121 mg/dL — ABNORMAL HIGH (ref 70–99)
Glucose-Capillary: 102 mg/dL — ABNORMAL HIGH (ref 70–99)

## 2018-03-24 LAB — COMPREHENSIVE METABOLIC PANEL
ALBUMIN: 2.5 g/dL — AB (ref 3.5–5.0)
ALT: 29 U/L (ref 0–44)
AST: 28 U/L (ref 15–41)
Alkaline Phosphatase: 159 U/L — ABNORMAL HIGH (ref 38–126)
Anion gap: 8 (ref 5–15)
BUN: 19 mg/dL (ref 6–20)
CHLORIDE: 101 mmol/L (ref 98–111)
CO2: 25 mmol/L (ref 22–32)
CREATININE: 0.45 mg/dL (ref 0.44–1.00)
Calcium: 8.4 mg/dL — ABNORMAL LOW (ref 8.9–10.3)
GFR calc Af Amer: >60 mL/min (ref >60)
GFR calc non Af Amer: >60 mL/min (ref >60)
GLUCOSE: 124 mg/dL — AB (ref 70–99)
POTASSIUM: 4.2 mmol/L (ref 3.5–5.1)
Sodium: 134 mmol/L — ABNORMAL LOW (ref 135–145)
Total Bilirubin: 0.3 mg/dL (ref 0.3–1.2)
Total Protein: 7 g/dL (ref 6.5–8.1)

## 2018-03-24 LAB — PHOSPHORUS: Phosphorus: 4.4 mg/dL (ref 2.5–4.6)

## 2018-03-24 MED ORDER — TRACE MINERALS CR-CU-MN-SE-ZN 10-1000-500-60 MCG/ML IV SOLN
INTRAVENOUS | Status: AC
  Administered 2018-03-24: 17:00:00 via INTRAVENOUS
  Filled 2018-03-24: qty 1560

## 2018-03-24 MED ORDER — FAT EMULSION PLANT BASED 20 % IV EMUL
250.0000 mL | INTRAVENOUS | Status: AC
  Administered 2018-03-24: 250 mL via INTRAVENOUS
  Filled 2018-03-24: qty 250

## 2018-03-24 NOTE — Progress Notes (Signed)
[place] CONSULT NOTE   Pharmacy Consult for: TPN Indication: gastric perforation with leak on the UGI  Patient Measurements: Height: 5\' 1"  (154.9 cm) Weight: 133 lb 2.5 oz (60.4 kg) IBW/kg (Calculated) : 47.8 TPN AdjBW (KG): 51 Body mass index is 25.16 kg/m.   Assessment: Pharmacy consulted to dose TPN in a patient with gastric perforation with leak on UGI.  GI:  Endo: non-diabetic patient with  glucose levels  (86>>129>>134>>115) Insulin requirements in the past 24 hours: received 1 units of insulin aspart at 2230 on 8/7 Lytes:8/9 K: 4.2   Phos: 4.4  Ca: 8.4 Pulm: Cards:  Hepatobil: Labs normal Neuro: ID: Zosyn 3.375g IV q8h ( for elevated WBC)   TPN Access: PICC line TPN start date: 03/19/18 Nutritional Goals (per RD recommendation on 8/7: KCal: 1500-1680 Protein: 75-85g Fluid: >1400 mL daily   Goal TPN rate is 65 ml/hr (provides 1587.6 kcal/day)  Current Nutrition/Assessment: Patient admitted to hospital 8/4 where she had regular home diet prior to admission.  She has been NPO since admission. Gastric perforation with repair and anticipated long NPO status. Patient tolerating TPN, BS stable--> will advance.  Plan:  Continue TPN rate at  65 mL/hr. This TPN provides 78g of protein, 234 g(796kcal) of dextrose, and 480kcal (53.3gm) of lipids which provides 1587.6 kCals per day, meeting ~100% of patient needs Electrolytes in TPN: Clinimix 5/15 with added electrolytes Add MVI, trace elements   Adjust MIVF  D5 1/2NS KCl 35mEq/Lfor total IVF rate of 47mls/hr Monitor TPN labs  Thomasenia Sales, PharmD, MBA, BCGP Clinical Pharmacist  03/24/2018 11:04 AM

## 2018-03-24 NOTE — Progress Notes (Signed)
7 Days Post-Op  Subjective: Denies any abdominal pain.  Is passing flatus and having bowel movement.  Is hungry.  Objective: Vital signs in last 24 hours: Temp:  [98.4 F (36.9 C)-98.7 F (37.1 C)] 98.4 F (36.9 C) (08/11 0607) Pulse Rate:  [84-94] 87 (08/11 0607) Resp:  [18-20] 18 (08/11 0607) BP: (123-128)/(88-91) 123/91 (08/11 0607) SpO2:  [94 %-98 %] 98 % (08/11 0607) Last BM Date: 03/23/18  Intake/Output from previous day: 08/10 0701 - 08/11 0700 In: 2246.7 [I.V.:2090; IV Piggyback:156.7] Out: 1100 [Urine:1100] Intake/Output this shift: No intake/output data recorded.  General appearance: alert, cooperative and no distress Resp: clear to auscultation bilaterally Cardio: regular rate and rhythm, S1, S2 normal, no murmur, click, rub or gallop GI: Soft, nontender.  Incision healing well.  Bowel sounds active.  Lab Results:  Recent Labs    03/22/18 0434  WBC 11.6*  HGB 12.0  HCT 36.4  PLT 407*   BMET Recent Labs    03/22/18 0434 03/24/18 0541  NA 136 134*  K 4.5 4.2  CL 105 101  CO2 23 25  GLUCOSE 115* 124*  BUN 16 19  CREATININE 0.51 0.45  CALCIUM 8.1* 8.4*   PT/INR No results for input(s): LABPROT, INR in the last 72 hours.  Studies/Results: Dg Abd 1 View  Result Date: 03/22/2018 CLINICAL DATA:  Recent gastric perforation from a perforated ulcer. Status post exploratory laparotomy and gastric omental patch on 03/17/2018. EXAM: ABDOMEN - 1 VIEW COMPARISON:  05/02/2017. Water-soluble upper GI series dated 03/19/2018. FINDINGS: The oral contrast has reached the rectum. No dilated bowel loops. On 1 of the views, there is a suggestion of anteriorly located free peritoneal air in the right upper abdomen, not seen on the other view centered more inferiorly. Skin clips. Cholecystectomy clips. Unremarkable bones. IMPRESSION: Probable postoperative free peritoneal air in the right upper abdomen. Otherwise, unremarkable examination. Electronically Signed   By: Claudie Revering M.D.   On: 03/22/2018 17:42    Anti-infectives: Anti-infectives (From admission, onward)   Start     Dose/Rate Route Frequency Ordered Stop   03/17/18 1000  piperacillin-tazobactam (ZOSYN) IVPB 3.375 g     3.375 g 12.5 mL/hr over 240 Minutes Intravenous Every 8 hours 03/17/18 0909     03/17/18 0215  piperacillin-tazobactam (ZOSYN) IVPB 3.375 g     3.375 g 100 mL/hr over 30 Minutes Intravenous  Once 03/17/18 0093 03/17/18 0252      Assessment/Plan: s/p Procedure(s): EXPLORATORY LAPAROTOMY GASTRORRHAPHY Impression: Doing well without evidence of peritonitis.  Upper GI scheduled for tomorrow.  LOS: 7 days    Aviva Signs 03/24/2018

## 2018-03-25 ENCOUNTER — Inpatient Hospital Stay (HOSPITAL_COMMUNITY): Payer: Medicare Other

## 2018-03-25 LAB — DIFFERENTIAL
BASOS ABS: 0 10*3/uL (ref 0.0–0.1)
BASOS PCT: 0 %
EOS ABS: 0.4 10*3/uL (ref 0.0–0.7)
EOS PCT: 3 %
LYMPHS PCT: 13 %
Lymphs Abs: 1.9 10*3/uL (ref 0.7–4.0)
Monocytes Absolute: 0.9 10*3/uL (ref 0.1–1.0)
Monocytes Relative: 6 %
Neutro Abs: 11.7 10*3/uL (ref 1.7–7.7)
Neutrophils Relative %: 78 %

## 2018-03-25 LAB — PREALBUMIN: PREALBUMIN: 19.5 mg/dL (ref 18–38)

## 2018-03-25 LAB — COMPREHENSIVE METABOLIC PANEL
ALBUMIN: 2.5 g/dL — AB (ref 3.5–5.0)
ALK PHOS: 212 U/L — AB (ref 38–126)
ALT: 29 U/L (ref 0–44)
ANION GAP: 9 (ref 5–15)
AST: 26 U/L (ref 15–41)
BUN: 20 mg/dL (ref 6–20)
CALCIUM: 8.6 mg/dL — AB (ref 8.9–10.3)
CO2: 24 mmol/L (ref 22–32)
Chloride: 103 mmol/L (ref 98–111)
Creatinine, Ser: 0.48 mg/dL (ref 0.44–1.00)
GFR calc non Af Amer: >60 mL/min (ref >60)
GLUCOSE: 110 mg/dL — AB (ref 70–99)
Potassium: 4.2 mmol/L (ref 3.5–5.1)
Sodium: 136 mmol/L (ref 135–145)
TOTAL PROTEIN: 7.4 g/dL (ref 6.5–8.1)
Total Bilirubin: 0.3 mg/dL (ref 0.3–1.2)

## 2018-03-25 LAB — CBC
HCT: 37.5 % (ref 36.0–46.0)
HEMOGLOBIN: 12.2 g/dL (ref 12.0–15.0)
MCH: 32.8 pg (ref 26.0–34.0)
MCHC: 32.5 g/dL (ref 30.0–36.0)
MCV: 100.8 fL — ABNORMAL HIGH (ref 78.0–100.0)
PLATELETS: 578 10*3/uL — AB (ref 150–400)
RBC: 3.72 MIL/uL — ABNORMAL LOW (ref 3.87–5.11)
RDW: 14 % (ref 11.5–15.5)
WBC: 14.9 10*3/uL — ABNORMAL HIGH (ref 4.0–10.5)

## 2018-03-25 LAB — GLUCOSE, CAPILLARY
GLUCOSE-CAPILLARY: 119 mg/dL — AB (ref 70–99)
GLUCOSE-CAPILLARY: 101 mg/dL — AB (ref 70–99)
Glucose-Capillary: 109 mg/dL — ABNORMAL HIGH (ref 70–99)
Glucose-Capillary: 97 mg/dL (ref 70–99)

## 2018-03-25 LAB — TRIGLYCERIDES: TRIGLYCERIDES: 146 mg/dL (ref <150)

## 2018-03-25 LAB — MAGNESIUM: Magnesium: 2.1 mg/dL (ref 1.7–2.4)

## 2018-03-25 LAB — PHOSPHORUS: Phosphorus: 4.6 mg/dL (ref 2.5–4.6)

## 2018-03-25 MED ORDER — FAT EMULSION PLANT BASED 20 % IV EMUL
250.0000 mL | INTRAVENOUS | Status: DC
  Administered 2018-03-25: 250 mL via INTRAVENOUS
  Filled 2018-03-25: qty 250

## 2018-03-25 MED ORDER — IOPAMIDOL (ISOVUE-300) INJECTION 61%
INTRAVENOUS | Status: AC
  Filled 2018-03-25: qty 300

## 2018-03-25 MED ORDER — TRACE MINERALS CR-CU-MN-SE-ZN 10-1000-500-60 MCG/ML IV SOLN
INTRAVENOUS | Status: AC
  Administered 2018-03-25: 17:00:00 via INTRAVENOUS
  Filled 2018-03-25: qty 1560

## 2018-03-25 MED ORDER — IOPAMIDOL (ISOVUE-300) INJECTION 61%
150.0000 mL | Freq: Once | INTRAVENOUS | Status: AC | PRN
  Administered 2018-03-25: 150 mL

## 2018-03-25 NOTE — Progress Notes (Signed)
PT Cancellation Note  Patient Details Name: Tiffany Stewart MRN: [id number] DOB: June 29, 1971   Cancelled Treatment:    Reason Eval/Treat Not Completed: Patient declined, no reason specified.  Patient declined therapy secondary to c/o fatigue after coming back from procedure, stated she would try in the morning.    3:41 PM, 03/25/18 Lonell Grandchild, MPT Physical Therapist with [facility] [phone] office [phone] mobile phone

## 2018-03-25 NOTE — Progress Notes (Signed)
8 Days Post-Op  Subjective: Patient is hungry.  Has some crampy abdominal pain with gas.  Did have a bowel movement this morning.  Objective: Vital signs in last 24 hours: Temp:  [98 F (36.7 C)-98.4 F (36.9 C)] 98 F (36.7 C) (08/12 0538) Pulse Rate:  [87-93] 93 (08/12 0538) Resp:  [18-20] 20 (08/12 0538) BP: (113-122)/(80-82) 121/80 (08/12 0538) SpO2:  [95 %-99 %] 98 % (08/12 0538) Last BM Date: 03/24/18  Intake/Output from previous day: 08/11 0701 - 08/12 0700 In: 2240.4 [I.V.:2079.3; IV Piggyback:161] Out: 1600 [Urine:1600] Intake/Output this shift: No intake/output data recorded.  General appearance: alert, cooperative and no distress Resp: clear to auscultation bilaterally Cardio: regular rate and rhythm, S1, S2 normal, no murmur, click, rub or gallop GI: Soft, incision healing well.  Bowel sounds active.  No rigidity.  Lab Results:  Recent Labs    03/25/18 0520  WBC 14.9*  HGB 12.2  HCT 37.5  PLT 578*   BMET Recent Labs    03/24/18 0541 03/25/18 0520  NA 134* 136  K 4.2 4.2  CL 101 103  CO2 25 24  GLUCOSE 124* 110*  BUN 19 20  CREATININE 0.45 0.48  CALCIUM 8.4* 8.6*   PT/INR No results for input(s): LABPROT, INR in the last 72 hours.  Studies/Results: No results found.  Anti-infectives: Anti-infectives (From admission, onward)   Start     Dose/Rate Route Frequency Ordered Stop   03/17/18 1000  piperacillin-tazobactam (ZOSYN) IVPB 3.375 g     3.375 g 12.5 mL/hr over 240 Minutes Intravenous Every 8 hours 03/17/18 0909     03/17/18 0215  piperacillin-tazobactam (ZOSYN) IVPB 3.375 g     3.375 g 100 mL/hr over 30 Minutes Intravenous  Once 03/17/18 1275 03/17/18 0252      Assessment/Plan: s/p Procedure(s): EXPLORATORY LAPAROTOMY GASTRORRHAPHY Impression: For upper GI series today to assess Graham plication.  Further management pending those results.  We will monitor her leukocytosis.  LOS: 8 days    Aviva Signs 03/25/2018

## 2018-03-25 NOTE — Progress Notes (Signed)
[place] CONSULT NOTE   Pharmacy Consult for: TPN Indication: gastric perforation with leak on the UGI  Patient Measurements: Height: 5\' 1"  (154.9 cm) Weight: 133 lb 2.5 oz (60.4 kg) IBW/kg (Calculated) : 47.8 TPN AdjBW (KG): 51 Body mass index is 25.16 kg/m.   Assessment: Pharmacy consulted to dose TPN in a patient with gastric perforation with leak on UGI.  GI:  Endo: non-diabetic patient with  glucose levels  (86>>129>>134>>115) Insulin requirements in the past 24 hours: received 1 units of insulin aspart at 2230 on 8/7 Lytes:WNL: monitor phos since at upper part of range Pulm: Cards:  Hepatobil: Labs normal Neuro: ID: Zosyn 3.375g IV q8h ( for elevated WBC)   TPN Access: PICC line TPN start date: 03/19/18 Nutritional Goals (per RD recommendation on 8/7: KCal: 1500-1680 Protein: 75-85g Fluid: >1400 mL daily   Goal TPN rate is 65 ml/hr (provides 1587.6 kcal/day)  Current Nutrition/Assessment: Patient admitted to hospital 8/4 where she had regular home diet prior to admission.  She has been NPO since admission. Gastric perforation with repair and anticipated long NPO status. Patient tolerating TPN, BS stable  Plan:  Continue TPN rate at  65 mL/hr. This TPN provides 78g of protein, 234 g(796kcal) of dextrose, and 480kcal (53.3gm) of lipids which provides 1587.6 kCals per day, meeting ~100% of patient needs Electrolytes in TPN: Clinimix 5/15 with added electrolytes Add MVI, trace elements Monitor phosphorus levels   Adjust MIVF  D5 1/2NS KCl 5mEq/Lfor total IVF rate of 32mls/hr Monitor TPN labs   Margot Ables, PharmD Clinical Pharmacist 03/25/2018 1:02 PM

## 2018-03-26 ENCOUNTER — Other Ambulatory Visit: Payer: Self-pay | Admitting: Family

## 2018-03-26 DIAGNOSIS — F411 Generalized anxiety disorder: Secondary | ICD-10-CM

## 2018-03-26 LAB — CBC
HCT: 36.7 % (ref 36.0–46.0)
Hemoglobin: 12.2 g/dL (ref 12.0–15.0)
MCH: 33.4 pg (ref 26.0–34.0)
MCHC: 33.2 g/dL (ref 30.0–36.0)
MCV: 100.5 fL — ABNORMAL HIGH (ref 78.0–100.0)
PLATELETS: 670 10*3/uL — AB (ref 150–400)
RBC: 3.65 MIL/uL — AB (ref 3.87–5.11)
RDW: 13.8 % (ref 11.5–15.5)
WBC: 14.5 10*3/uL — AB (ref 4.0–10.5)

## 2018-03-26 LAB — GLUCOSE, CAPILLARY
GLUCOSE-CAPILLARY: 109 mg/dL — AB (ref 70–99)
GLUCOSE-CAPILLARY: 115 mg/dL — AB (ref 70–99)
Glucose-Capillary: 103 mg/dL — ABNORMAL HIGH (ref 70–99)
Glucose-Capillary: 87 mg/dL (ref 70–99)

## 2018-03-26 MED ORDER — PANTOPRAZOLE SODIUM 40 MG PO TBEC
40.0000 mg | DELAYED_RELEASE_TABLET | Freq: Every day | ORAL | Status: DC
  Administered 2018-03-26 – 2018-03-27 (×2): 40 mg via ORAL
  Filled 2018-03-26 (×2): qty 1

## 2018-03-26 NOTE — Progress Notes (Signed)
9 Days Post-Op  Subjective: Patient denies any abdominal pain.  Objective: Vital signs in last 24 hours: Temp:  [98.2 F (36.8 C)-98.8 F (37.1 C)] 98.4 F (36.9 C) (08/13 0551) Pulse Rate:  [92-103] 92 (08/13 0551) Resp:  [18] 18 (08/13 0551) BP: (116-122)/(82-93) 118/82 (08/13 0551) SpO2:  [96 %-97 %] 97 % (08/13 0551) Last BM Date: 03/25/18  Intake/Output from previous day: 08/12 0701 - 08/13 0700 In: 3075.3 [P.O.:840; I.V.:2094.9; IV Piggyback:140.4] Out: 1650 [Urine:1650] Intake/Output this shift: No intake/output data recorded.  General appearance: alert, cooperative and no distress Resp: clear to auscultation bilaterally Cardio: regular rate and rhythm, S1, S2 normal, no murmur, click, rub or gallop GI: Soft, incision healing well.  Active bowel sounds appreciated.  Lab Results:  Recent Labs    03/25/18 0520 03/26/18 0414  WBC 14.9* 14.5*  HGB 12.2 12.2  HCT 37.5 36.7  PLT 578* 670*   BMET Recent Labs    03/24/18 0541 03/25/18 0520  NA 134* 136  K 4.2 4.2  CL 101 103  CO2 25 24  GLUCOSE 124* 110*  BUN 19 20  CREATININE 0.45 0.48  CALCIUM 8.4* 8.6*   PT/INR No results for input(s): LABPROT, INR in the last 72 hours.  Studies/Results: Dg Ugi W/water Sol Cm  Result Date: 03/25/2018 CLINICAL DATA:  Status post repair of a pre pyloric perforated ulcer on 03/17/2018. Contained posterior wall gastric perforation on prior exam. EXAM: WATER SOLUBLE UPPER GI SERIES TECHNIQUE: Single-column upper GI series was performed using water soluble contrast. COMPARISON:  03/19/2018.  Abdomen series of 03/22/2018. FLUOROSCOPY TIME:  Fluoroscopy Time:  9 minutes and 0 seconds Radiation Exposure Index (if provided by the fluoroscopic device): Not applicable. Number of Acquired Spot Images: 0 FINDINGS: Mild exam limitations secondary to patient inability to cooperate and immobility. Preprocedure scout films demonstrate midline laparotomy staples and cholecystectomy clips. The  previously described posterior contained perforation involving the pre-pyloric distal stomach is improved. Only subtly apparent, including on series 29 and likely series 37. IMPRESSION: Improved appearance of a now diminutive/subtle contained perforation involving the posterior distal stomach. Electronically Signed   By: Abigail Miyamoto M.D.   On: 03/25/2018 12:04    Anti-infectives: Anti-infectives (From admission, onward)   Start     Dose/Rate Route Frequency Ordered Stop   03/17/18 1000  piperacillin-tazobactam (ZOSYN) IVPB 3.375 g  Status:  Discontinued     3.375 g 12.5 mL/hr over 240 Minutes Intravenous Every 8 hours 03/17/18 0909 03/26/18 0813   03/17/18 0215  piperacillin-tazobactam (ZOSYN) IVPB 3.375 g     3.375 g 100 mL/hr over 30 Minutes Intravenous  Once 03/17/18 6568 03/17/18 0252      Assessment/Plan: s/p Procedure(s): EXPLORATORY LAPAROTOMY GASTRORRHAPHY Imp: Doing well.  Follow-up upper GI series shows minimal collection.  Patient was advanced to full liquid diet yesterday.  Has had multiple bowel movements.  Will advance to soft diet today.  Will wean TPN off.  Will DC Zosyn.  Will check CBC tomorrow.  Hopefully will be discharged in next 24 to 48 hours.  LOS: 9 days    Aviva Signs 03/26/2018

## 2018-03-26 NOTE — Progress Notes (Signed)
Physical Therapy Treatment Patient Details Name: Tiffany Stewart MRN: [id number] DOB: Dec 17, 1970 Today's Date: 03/26/2018    History of Present Illness Patient is a 47 year old white female who was brought to the emergency room by family member for worsening abdominal pain.  She states the pain came suddenly upon her.  CT scan of the abdomen revealed perforated viscus most likely secondary to a perforated peptic ulcer.  Patient states she has been taking Goody powders to treat a headache.  She has a history of chronic pain, anxiety, and hereditary spastic paraplegia.  She describes her pain as 10 out of 10    PT Comments    Patient demonstrates fair/good return for using armrest of w/c for transfers, able to use BUE to propel self in hallways with c/o of minor discomfort at surgical site and tolerated sitting up in w/c after therapy - nursing staff aware.  Patient will benefit from continued physical therapy in hospital and recommended venue below to increase strength, balance, endurance for safe ADLs and gait.    Follow Up Recommendations  Supervision for mobility/OOB;Supervision/Assistance - 24 hour;Home health PT     Equipment Recommendations  None recommended by PT    Recommendations for Other Services       Precautions / Restrictions Precautions Precautions: Fall Precaution Comments: paraplegic Restrictions Weight Bearing Restrictions: No    Mobility  Bed Mobility Overal bed mobility: Needs Assistance Bed Mobility: Supine to Sit     Supine to sit: Min guard     General bed mobility comments: labored movement and use of siderail  Transfers Overall transfer level: Needs assistance Equipment used: 1 person hand held assist Transfers: Sit to/from [facility] Sit to Stand: Min assist Stand pivot transfers: Min assist       General transfer comment: using armrest of wheelchair for stand pivot transfers  Ambulation/Gait                 Theme park manager mobility: Yes Wheelchair propulsion: Both upper extremities Wheelchair parts: Supervision/cueing Distance: 100 feet Wheelchair Assistance Details (indicate cue type and reason): demonstrates good return for locking/unlocking w/c, propelling self in w/c and using armrest of w/c for transfers  Modified Rankin (Stroke Patients Only)       Balance Overall balance assessment: Needs assistance Sitting-balance support: Feet supported;No upper extremity supported Sitting balance-Leahy Scale: Good     Standing balance support: Bilateral upper extremity supported Standing balance-Leahy Scale: Poor Standing balance comment: fair/poor using armrest of w/c                            Cognition Arousal/Alertness: Awake/alert Behavior During Therapy: WFL for tasks assessed/performed Overall Cognitive Status: Within Functional Limits for tasks assessed                                        Exercises      General Comments        Pertinent Vitals/Pain Pain Assessment: No/denies pain    Home Living                      Prior Function            PT Goals (current goals can now be found in the care plan  section) Acute Rehab PT Goals Patient Stated Goal: return home with family to assist PT Goal Formulation: With patient Time For Goal Achievement: 04/03/18 Potential to Achieve Goals: Good Progress towards PT goals: Progressing toward goals    Frequency    Min 3X/week      PT Plan Current plan remains appropriate    Co-evaluation              AM-PAC PT "6 Clicks" Daily Activity  Outcome Measure  Difficulty turning over in bed (including adjusting bedclothes, sheets and blankets)?: A Little Difficulty moving from lying on back to sitting on the side of the bed? : A Little Difficulty sitting down on and standing up from a chair with arms (e.g., wheelchair,  bedside commode, etc,.)?: A Lot Help needed moving to and from a bed to chair (including a wheelchair)?: A Lot Help needed walking in hospital room?: Total Help needed climbing 3-5 steps with a railing? : Total 6 Click Score: 12    End of Session   Activity Tolerance: Patient tolerated treatment well;Patient limited by fatigue Patient left: in chair;with call bell/phone within reach Nurse Communication: Mobility status;Other (comment)(nursing staff aware that patient left up in chair) PT Visit Diagnosis: Unsteadiness on feet (R26.81);Muscle weakness (generalized) (M62.81);Difficulty in walking, not elsewhere classified (R26.2)     Time: 8295-6213 PT Time Calculation (min) (ACUTE ONLY): 27 min  Charges:  $Therapeutic Activity: 23-37 mins                     2:32 PM, 03/26/18 Lonell Grandchild, MPT Physical Therapist with [facility] [phone] office [phone] mobile phone

## 2018-03-26 NOTE — Telephone Encounter (Signed)
Last fill 02/28/18

## 2018-03-27 LAB — CBC
HEMATOCRIT: 36.8 % (ref 36.0–46.0)
HEMOGLOBIN: 12 g/dL (ref 12.0–15.0)
MCH: 33.1 pg (ref 26.0–34.0)
MCHC: 32.6 g/dL (ref 30.0–36.0)
MCV: 101.4 fL — ABNORMAL HIGH (ref 78.0–100.0)
Platelets: 736 10*3/uL — ABNORMAL HIGH (ref 150–400)
RBC: 3.63 MIL/uL — ABNORMAL LOW (ref 3.87–5.11)
RDW: 13.8 % (ref 11.5–15.5)
WBC: 14.8 10*3/uL — ABNORMAL HIGH (ref 4.0–10.5)

## 2018-03-27 LAB — GLUCOSE, CAPILLARY: Glucose-Capillary: 94 mg/dL (ref 70–99)

## 2018-03-27 MED ORDER — OXYCODONE-ACETAMINOPHEN 7.5-325 MG PO TABS: 1.0000 | ORAL_TABLET | Freq: Four times a day (QID) | ORAL | 0 refills | Status: DC | PRN

## 2018-03-27 NOTE — Care Management (Signed)
Patient Information   Patient Name Tiffany Stewart, Tiffany Stewart ([id number]) Sex Female DOB Jun 03, 1971  Room Bed  A330 A330-01  Patient Demographics   Address [place] [address] Phone [phone] (Home) [phone] (Mobile) *Preferred*  Patient Ethnicity & Race   Ethnic Group Patient Race  Not Hispanic or Latino White or Caucasian  Emergency Contact(s)   Name Relation Home Work Mobile  [place] Mother [phone]    Demaya, [place] (Call 1st) Niece   [phone]  Documents on File    Status Date Received Description  Documents for the Patient  EMR Medication Summary Not Received    EMR Problem Summary Not Received    EMR Patient Summary Not Received    Driver's License Received 29/93/71 WRFM/JB  [place] HIPAA NOTICE OF PRIVACY - Scanned Received 10/12/10   [place] E-Signature HIPAA Notice of Privacy Received 10/07/10   [place] E-Signature HIPAA Notice of Privacy Spanish Not Received    Advance Directives/Living Will/HCPOA/POA Not Received    Historic Radiology Documentation Not Received    Insurance Card Not Received    Financial Application Not Received    Insurance Card Not Received    Insurance Card Not Received    AMB Correspondence Not Received  09/13 [facility]  AMB Correspondence Not Received  11/13 order [place] Not Received  [facility]  HIM ROI Authorization Not Received    Advanced Beneficiary Notice (ABN) Not Received    Insurance Card Received 11/25/12   Insurance Card Received 11/25/12 WRFM/MCR/MCD  Release of Information Received 11/25/12 WRFM/JB  AMB Correspondence  12/18/13 04/15 ORDER [facility]  AMB Correspondence  01/14/14 04/15 CMN [facility] [employer]  Insurance Card     Insurance Card Received 08/06/14 Medicaid/AP Rehab/Nf  Driver's License     Insurance Card Received 05/22/14 mcd/wrfm  AMB Correspondence  05/21/14 REFERRAL [facility]  E-Signature AOB Spanish  Not Received    Other Photo ID Not Received    [place] E-Signature HIPAA Notice of Privacy Signed 10/30/16   HIM ROI Authorization (Expired) 01/01/17 [facility]  AMB Provider Completed Forms  01/02/17 REQUEST FOR INDEPENDENT ASSESSMENT FOR [facility] [place] DHHS  AMB Provider Completed Forms  01/03/17 REQUEST FOR ASSESSMENT FOR [facility] DHHS  AMB HH/NH/Hospice  02/12/17 PROFESSIONAL COMMUNICATION [facility]  AMB HH/NH/Hospice  69/67/89 HH CERTIFICATION/POC [facility]  AMB Correspondence  01/01/17 [facility]  Release of Information Received 04/02/17 [facility]  HIM ROI Authorization  04/09/17 [facility]  AMB Correspondence  04/09/17 PRIOR AUTHORIZATION & NOTIFICATION [employer]  AMB HH/NH/Hospice  38/10/17 CERTIFICATION/POC [facility]  HIM ROI Authorization  04/18/17 [facility] [place] Card Received 06/04/17 [place] MEDICARE  Insurance Card Received 06/04/17 WRFM/MEDICARE  HIM ROI Authorization  06/07/17 IP STAY RECORDS REQUESTED FOR DOS 04/30/17-05/03/17   Insurance Card Received 11/02/17 Medicare A & B [facility]  Insurance Card Received 11/02/17 [employer] [facility]  Driver's License Received 51/02/58 [place] Drivers License [facility]  Release of Information Received 11/02/17 [facility]PR [facility] 11/02/17 [facility]  AMB Correspondence (Deleted) 05/13/12   AMB Correspondence (Deleted) 07/08/12   Insurance Card (Deleted) 11/25/12   E-Signature AOB Spanish Not Received (Deleted)    AMB Correspondence (Deleted) 06/03/14 REFERRAL  [place]  Documents for the Encounter  AOB (Assignment of Insurance Benefits) Not Received    E-signature AOB Received 03/18/18 unable to sign due to condition  MEDICARE RIGHTS Not Received  E-signature Medicare Rights Received 03/18/18 unable to sign due to condition  ED Patient Billing Extract   ED PB Billing Extract  EKG Received 03/18/18   Admission Information   Attending  Provider Admitting Provider Admission Type Admission Date/Time  Aviva Signs, MD Aviva Signs, MD Emergency 03/16/18 2206  Discharge Date Hospital Service Auth/Cert Status Service Area   Surgery Incomplete [facility]  Unit Room/Bed Admission Status   AP-DEPT 300 A330/A330-01 Admission (Confirmed)   Admission   Complaint  sick  Hospital Account   Name Acct ID Class Status Primary Coverage  Tiffany Stewart, Tiffany Stewart [id number] Inpatient [place]      Guarantor Account (for Hospital Account [id number])   Name Relation to Pt Service Area Active? Acct Type  Tiffany Stewart Self CHSA Yes Personal/Family  Address Phone    [address] [place], [place] [address] [phone])        Coverage Information (for Hospital Account [id number])   [place]   F/O Payor/Plan Precert #  [facility] Thomas #  [place], [place] [id number]  [place]  [address] [address] [id number]  2. MEDICAID [place]/MEDICAID [place] ACCESS   F/O Payor/Plan Precert #  MEDICAID [place]/MEDICAID [place] ACCESS   Subscriber Subscriber #  [place], [place] [id number]  Address Phone  [address] [address] [place], [place] [address] [phone]

## 2018-03-27 NOTE — Care Management Important Message (Signed)
Important Message  Patient Details  Name: Tiffany Stewart MRN: [id number] Date of Birth: 1971/07/20   Medicare Important Message Given:  Yes    Shelda Altes 03/27/2018, 12:25 PM

## 2018-03-27 NOTE — Progress Notes (Signed)
Pt discharging home today with self care. Pt declines [facility] for hospital f/u. She says she needs 3 in 1 as hers in broke. She thinks she has gotten one in the past 5 years. Has no preference of DME provider [facility] rep not on site and [facility] will not ship to pt home. Referral routed to [place] who will run insurance give pt's prices if she would have to pay OOP and deliver to pt home. Pt's s/o coming to transport home.

## 2018-03-27 NOTE — Discharge Summary (Signed)
Physician Discharge Summary  Patient ID: Tiffany Stewart MRN: [id number] DOB/AGE: 45-Sep-1972 45 y.o.  Admit date: 03/16/2018 Discharge date: 03/27/2018  Admission Diagnoses: Perforated viscus, peritonitis  Discharge Diagnoses: Same, perforated peptic ulcer Active Problems:   Gastric perforation ([place])   Perforated peptic ulcer ([place]) Neurologic spasticity disorder, anxiety  Discharged Condition: good  Hospital Course: Patient is a 47 year old white female who presented to [facility] emergency room with worsening abdominal pain.  CT scan of the abdomen revealed pneumoperitoneum.  Patient states that she has been taking many Goody powders for musculoskeletal pain.  She subsequently went to the operating room and underwent an exploratory laparotomy with Phillip Heal plication.  She had a prepyloric posterior perforation.  She tolerated the surgery well.  She pulled out her NG tube soon after extubation.  She refused to have it replaced.  Approximately 4 days later, she underwent an upper GI series which showed a small contained leak from the gastrorrhaphy.  It was elected to continue her n.p.o. status and she was started on TPN.  A follow-up upper GI series done on 03/25/2018 revealed minimal leakage.  Patient's abdomen is been soft.  She has had a mild leukocytosis, though there is no left shift.  Her diet was advanced without difficulty.  The patient is being discharged home on 03/27/2018 in good and stable condition.  Her recovery will be assisted by home health.  She was given strict instructions to avoid any Goody powders.  Treatments: surgery: Exploratory laparotomy, Graham plication on 03/15/9561  Discharge Exam: Blood pressure 106/76, pulse (!) 111, temperature 98.3 F (36.8 C), temperature source Oral, resp. rate 18, height 5\' 1"  (1.549 m), weight 60.4 kg, SpO2 96 %. General appearance: alert, cooperative and no distress Resp: clear to auscultation bilaterally Cardio: regular rate and rhythm,  S1, S2 normal, no murmur, click, rub or gallop GI: Soft, incision healing well.  Staples removed, Steri-Strips applied.  Active bowel sounds appreciated.  Disposition: Home   Allergies as of 03/27/2018      Reactions   Hctz [hydrochlorothiazide] Diarrhea   Morphine Other (See Comments)   Flushing, turns skin red    Zoloft [sertraline Hcl] Other (See Comments)   Spaced out      Medication List    STOP taking these medications   baclofen 20 MG tablet Commonly known as:  LIORESAL     TAKE these medications   acetaminophen 500 MG tablet Commonly known as:  TYLENOL Take 1,500 mg by mouth every 6 (six) hours as needed for moderate pain.   ALPRAZolam 0.5 MG tablet Commonly known as:  XANAX TAKE 1 TABLET BY MOUTH TWICE DAILY AS NEEDED FOR ANXIETY.   escitalopram 10 MG tablet Commonly known as:  LEXAPRO Take 1 tablet (10 mg total) by mouth daily.   oxyCODONE-acetaminophen 7.5-325 MG tablet Commonly known as:  PERCOCET Take 1 tablet by mouth every 6 (six) hours as needed for severe pain.   pantoprazole 40 MG tablet Commonly known as:  PROTONIX Take 1 tablet (40 mg total) by mouth 2 (two) times daily before a meal.   tiZANidine 4 MG tablet Commonly known as:  ZANAFLEX TAKE 1 TABLET EVERY 6 HOURS AS NEEDED FOR MUSCLE SPASMS            Durable Medical Equipment  (From admission, onward)         Start     Ordered   03/27/18 0913  For home use only DME 3 n 1  Once  03/27/18 0913         Follow-up Information    Aviva Signs, MD. Schedule an appointment as soon as possible for a visit on 04/09/2018.   Specialty:  General Surgery Contact information: [address] [address] [phone]           Signed: Aviva Signs 03/27/2018, 10:18 AM

## 2018-03-27 NOTE — Progress Notes (Signed)
Patient PICC removed, tolerated well. Patient given discharge instructions at bedside.

## 2018-04-05 ENCOUNTER — Emergency Department (HOSPITAL_COMMUNITY): Payer: Medicare Other

## 2018-04-05 ENCOUNTER — Inpatient Hospital Stay (HOSPITAL_COMMUNITY)
Admission: EM | Admit: 2018-04-05 | Discharge: 2018-04-11 | DRG: 393 | Disposition: A | Discharge status: Home / Self Care | Payer: Medicare Other | Attending: General Surgery | Admitting: General Surgery

## 2018-04-05 ENCOUNTER — Encounter (HOSPITAL_COMMUNITY): Payer: Self-pay | Admitting: Emergency Medicine

## 2018-04-05 ENCOUNTER — Other Ambulatory Visit: Payer: Self-pay

## 2018-04-05 DIAGNOSIS — Z888 Allergy status to other drugs, medicaments and biological substances status: Secondary | ICD-10-CM

## 2018-04-05 DIAGNOSIS — R109 Unspecified abdominal pain: Secondary | ICD-10-CM

## 2018-04-05 DIAGNOSIS — R112 Nausea with vomiting, unspecified: Secondary | ICD-10-CM

## 2018-04-05 DIAGNOSIS — F419 Anxiety disorder, unspecified: Secondary | ICD-10-CM | POA: Diagnosis present

## 2018-04-05 DIAGNOSIS — I4581 Long QT syndrome: Secondary | ICD-10-CM | POA: Diagnosis present

## 2018-04-05 DIAGNOSIS — G894 Chronic pain syndrome: Secondary | ICD-10-CM | POA: Diagnosis present

## 2018-04-05 DIAGNOSIS — K255 Chronic or unspecified gastric ulcer with perforation: Secondary | ICD-10-CM | POA: Diagnosis present

## 2018-04-05 DIAGNOSIS — K668 Other specified disorders of peritoneum: Principal | ICD-10-CM | POA: Diagnosis present

## 2018-04-05 DIAGNOSIS — E876 Hypokalemia: Secondary | ICD-10-CM | POA: Diagnosis present

## 2018-04-05 DIAGNOSIS — F329 Major depressive disorder, single episode, unspecified: Secondary | ICD-10-CM | POA: Diagnosis present

## 2018-04-05 DIAGNOSIS — F17200 Nicotine dependence, unspecified, uncomplicated: Secondary | ICD-10-CM | POA: Diagnosis present

## 2018-04-05 DIAGNOSIS — Z79899 Other long term (current) drug therapy: Secondary | ICD-10-CM

## 2018-04-05 DIAGNOSIS — G114 Hereditary spastic paraplegia: Secondary | ICD-10-CM | POA: Diagnosis present

## 2018-04-05 DIAGNOSIS — G43909 Migraine, unspecified, not intractable, without status migrainosus: Secondary | ICD-10-CM | POA: Diagnosis present

## 2018-04-05 DIAGNOSIS — K219 Gastro-esophageal reflux disease without esophagitis: Secondary | ICD-10-CM | POA: Diagnosis present

## 2018-04-05 DIAGNOSIS — F17210 Nicotine dependence, cigarettes, uncomplicated: Secondary | ICD-10-CM | POA: Diagnosis present

## 2018-04-05 DIAGNOSIS — D509 Iron deficiency anemia, unspecified: Secondary | ICD-10-CM | POA: Diagnosis present

## 2018-04-05 DIAGNOSIS — Z8711 Personal history of peptic ulcer disease: Secondary | ICD-10-CM | POA: Diagnosis not present

## 2018-04-05 DIAGNOSIS — E43 Unspecified severe protein-calorie malnutrition: Secondary | ICD-10-CM | POA: Diagnosis present

## 2018-04-05 DIAGNOSIS — R1084 Generalized abdominal pain: Secondary | ICD-10-CM | POA: Diagnosis not present

## 2018-04-05 DIAGNOSIS — Z885 Allergy status to narcotic agent status: Secondary | ICD-10-CM

## 2018-04-05 HISTORY — DX: Chronic or unspecified peptic ulcer, site unspecified, with perforation: K27.5

## 2018-04-05 LAB — MAGNESIUM
Magnesium: 2.2 mg/dL (ref 1.7–2.4)
Magnesium: 2.3 mg/dL (ref 1.7–2.4)

## 2018-04-05 LAB — COMPREHENSIVE METABOLIC PANEL
ALK PHOS: 148 U/L — AB (ref 38–126)
ALT: 20 U/L (ref 0–44)
AST: 18 U/L (ref 15–41)
Albumin: 3.6 g/dL (ref 3.5–5.0)
Anion gap: 15 (ref 5–15)
BUN: 21 mg/dL — AB (ref 6–20)
CHLORIDE: 98 mmol/L (ref 98–111)
CO2: 32 mmol/L (ref 22–32)
CREATININE: 0.66 mg/dL (ref 0.44–1.00)
Calcium: 9.2 mg/dL (ref 8.9–10.3)
GFR calc Af Amer: >60 mL/min (ref >60)
GFR calc non Af Amer: >60 mL/min (ref >60)
Glucose, Bld: 112 mg/dL — ABNORMAL HIGH (ref 70–99)
Potassium: 2.8 mmol/L — ABNORMAL LOW (ref 3.5–5.1)
SODIUM: 145 mmol/L (ref 135–145)
Total Bilirubin: 0.6 mg/dL (ref 0.3–1.2)
Total Protein: 9.1 g/dL — ABNORMAL HIGH (ref 6.5–8.1)

## 2018-04-05 LAB — URINALYSIS, ROUTINE W REFLEX MICROSCOPIC
BACTERIA UA: NONE SEEN
Bilirubin Urine: NEGATIVE
Glucose, UA: NEGATIVE mg/dL
HGB URINE DIPSTICK: NEGATIVE
Ketones, ur: 20 mg/dL — AB
Leukocytes, UA: NEGATIVE
NITRITE: NEGATIVE
Protein, ur: 100 mg/dL — AB
Specific Gravity, Urine: >1.046 — ABNORMAL HIGH (ref 1.005–1.030)
pH: 8 (ref 5.0–8.0)

## 2018-04-05 LAB — CBC WITH DIFFERENTIAL/PLATELET
Basophils Absolute: 0 10*3/uL (ref 0.0–0.1)
Basophils Relative: 0 %
Eosinophils Absolute: 0 10*3/uL (ref 0.0–0.7)
Eosinophils Relative: 0 %
HEMATOCRIT: 39.1 % (ref 36.0–46.0)
HEMOGLOBIN: 13.1 g/dL (ref 12.0–15.0)
LYMPHS PCT: 17 %
Lymphs Abs: 2.4 10*3/uL (ref 0.7–4.0)
MCH: 33.4 pg (ref 26.0–34.0)
MCHC: 33.5 g/dL (ref 30.0–36.0)
MCV: 99.7 fL (ref 78.0–100.0)
MONOS PCT: 4 %
Monocytes Absolute: 0.6 10*3/uL (ref 0.1–1.0)
NEUTROS PCT: 79 %
Neutro Abs: 11.1 10*3/uL — ABNORMAL HIGH (ref 1.7–7.7)
Platelets: 818 10*3/uL — ABNORMAL HIGH (ref 150–400)
RBC: 3.92 MIL/uL (ref 3.87–5.11)
RDW: 13.4 % (ref 11.5–15.5)
WBC: 14 10*3/uL — ABNORMAL HIGH (ref 4.0–10.5)

## 2018-04-05 LAB — LIPASE, BLOOD: Lipase: 36 U/L (ref 11–51)

## 2018-04-05 LAB — LACTIC ACID, PLASMA
Lactic Acid, Venous: 0.8 mmol/L (ref 0.5–1.9)
Lactic Acid, Venous: 0.9 mmol/L (ref 0.5–1.9)
Lactic Acid, Venous: 0.8 mmol/L (ref 0.5–1.9)

## 2018-04-05 LAB — TROPONIN I: Troponin I: <0.03 ng/mL (ref <0.03)

## 2018-04-05 MED ORDER — FENTANYL CITRATE (PF) 100 MCG/2ML IJ SOLN
50.0000 ug | INTRAMUSCULAR | Status: AC | PRN
  Administered 2018-04-05 (×2): 50 ug via INTRAVENOUS
  Filled 2018-04-05 (×2): qty 2

## 2018-04-05 MED ORDER — ACETAMINOPHEN 650 MG RE SUPP: 650.0000 mg | Freq: Four times a day (QID) | RECTAL | Status: DC | PRN

## 2018-04-05 MED ORDER — ENOXAPARIN SODIUM 40 MG/0.4ML ~~LOC~~ SOLN
40.0000 mg | SUBCUTANEOUS | Status: DC
  Administered 2018-04-06 – 2018-04-10 (×5): 40 mg via SUBCUTANEOUS
  Filled 2018-04-05 (×5): qty 0.4

## 2018-04-05 MED ORDER — IOPAMIDOL (ISOVUE-300) INJECTION 61%
50.0000 mL | Freq: Once | INTRAVENOUS | Status: AC | PRN
  Administered 2018-04-05: 50 mL via ORAL

## 2018-04-05 MED ORDER — PIPERACILLIN-TAZOBACTAM 3.375 G IVPB
3.3750 g | Freq: Once | INTRAVENOUS | Status: AC
  Administered 2018-04-06: 3.375 g via INTRAVENOUS
  Filled 2018-04-05: qty 50

## 2018-04-05 MED ORDER — PIPERACILLIN-TAZOBACTAM 3.375 G IVPB 30 MIN
3.3750 g | Freq: Once | INTRAVENOUS | Status: AC
  Administered 2018-04-05: 3.375 g via INTRAVENOUS
  Filled 2018-04-05: qty 50

## 2018-04-05 MED ORDER — FENTANYL CITRATE (PF) 100 MCG/2ML IJ SOLN
50.0000 ug | INTRAMUSCULAR | Status: DC | PRN
  Administered 2018-04-05: 50 ug via INTRAVENOUS
  Filled 2018-04-05: qty 2

## 2018-04-05 MED ORDER — HYDRALAZINE HCL 20 MG/ML IJ SOLN: 10.0000 mg | INTRAMUSCULAR | Status: DC | PRN

## 2018-04-05 MED ORDER — HYDROMORPHONE HCL 1 MG/ML IJ SOLN
1.0000 mg | INTRAMUSCULAR | Status: DC | PRN
  Administered 2018-04-05 – 2018-04-06 (×3): 1 mg via INTRAVENOUS
  Filled 2018-04-05 (×3): qty 1

## 2018-04-05 MED ORDER — POTASSIUM CHLORIDE 10 MEQ/100ML IV SOLN
10.0000 meq | INTRAVENOUS | Status: AC
Start: 2018-04-05 — End: 2018-04-05
  Administered 2018-04-05 (×2): 10 meq via INTRAVENOUS
  Filled 2018-04-05 (×2): qty 100

## 2018-04-05 MED ORDER — SODIUM CHLORIDE 0.9 % IV SOLN
INTRAVENOUS | Status: DC
  Administered 2018-04-06 – 2018-04-07 (×2): via INTRAVENOUS

## 2018-04-05 MED ORDER — ONDANSETRON HCL 4 MG PO TABS: 4.0000 mg | ORAL_TABLET | Freq: Four times a day (QID) | ORAL | Status: DC | PRN

## 2018-04-05 MED ORDER — SODIUM CHLORIDE 0.9 % IV SOLN
INTRAVENOUS | Status: DC
Start: 2018-04-05 — End: 2018-04-05
  Administered 2018-04-05: 17:00:00 via INTRAVENOUS

## 2018-04-05 MED ORDER — FAMOTIDINE IN NACL 20-0.9 MG/50ML-% IV SOLN
20.0000 mg | Freq: Once | INTRAVENOUS | Status: AC
  Administered 2018-04-05: 20 mg via INTRAVENOUS
  Filled 2018-04-05: qty 50

## 2018-04-05 MED ORDER — ACETAMINOPHEN 325 MG PO TABS
650.0000 mg | ORAL_TABLET | Freq: Four times a day (QID) | ORAL | Status: DC | PRN
  Administered 2018-04-10 – 2018-04-11 (×2): 650 mg via ORAL
  Filled 2018-04-05 (×2): qty 2

## 2018-04-05 MED ORDER — IOPAMIDOL (ISOVUE-300) INJECTION 61%
100.0000 mL | Freq: Once | INTRAVENOUS | Status: AC | PRN
  Administered 2018-04-05: 100 mL via INTRAVENOUS

## 2018-04-05 MED ORDER — POTASSIUM CHLORIDE 10 MEQ/100ML IV SOLN
10.0000 meq | INTRAVENOUS | Status: AC
  Administered 2018-04-06 (×3): 10 meq via INTRAVENOUS
  Filled 2018-04-05 (×3): qty 100

## 2018-04-05 MED ORDER — ONDANSETRON HCL 4 MG/2ML IJ SOLN
4.0000 mg | Freq: Four times a day (QID) | INTRAMUSCULAR | Status: DC | PRN
  Administered 2018-04-06 – 2018-04-10 (×9): 4 mg via INTRAVENOUS
  Filled 2018-04-05 (×9): qty 2

## 2018-04-05 MED ORDER — ONDANSETRON HCL 4 MG/2ML IJ SOLN
4.0000 mg | INTRAMUSCULAR | Status: AC | PRN
  Administered 2018-04-05 (×2): 4 mg via INTRAVENOUS
  Filled 2018-04-05 (×2): qty 2

## 2018-04-05 NOTE — ED Triage Notes (Signed)
Patient post op Hernia surgery within the last 2 weeks at this facility. Complaining of abdominal beginning about 1100 with nausea, vomiting and diarrhea.

## 2018-04-05 NOTE — H&P (Signed)
TRH H&P    Patient Demographics:    Tiffany Stewart, is a 47 y.o. female  MRN: [id number]  DOB - 1970-11-21  Admit Date - 04/05/2018  Referring MD/NP/PA: Dr. Thurnell Garbe  Outpatient Primary MD for the patient is Chevis Pretty, [place]  Patient coming from: Home  Chief complaint-vomiting   HPI:    Tiffany Stewart  is a 47 y.o. female, with a history of chronic pain syndrome, perforated gastric ulcer status post gastrorrhaphy on 03/17/2018 came to hospital with chief complaints of nausea vomiting and abdominal pain which started this morning.  Patient says that she had multiple episodes of vomiting, also had 3 loose bowel movements.  Patient does have chronic pain syndrome and has not been able to take her medications due to persistent vomiting.  She complains of generalized body aches including pain in the shoulders, legs, chest.  She denies shortness of breath.  No dizziness.  No blurred vision.  Did not pass out. No previous history of stroke or seizures.  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   Review of systems:      All other systems reviewed and are negative.   With Past History of the following :    Past Medical History:  Diagnosis Date  . Anxiety   . Chronic pain   . Depression   . GERD (gastroesophageal reflux disease)   . HSP (hereditary spastic paraplegia) ([place])   . Migraines   . Perforated peptic ulcer ([place]) 03/17/2018      Past Surgical History:  Procedure Laterality Date  . CESAREAN SECTION    . CHOLECYSTECTOMY    . ENDOMETRIAL ABLATION  2000  . GASTRORRHAPHY  03/17/2018   Procedure: GASTRORRHAPHY;  Surgeon: Aviva Signs, MD;  Location: [facility];  Service: General;;  . HERNIA REPAIR    . LAPAROTOMY N/A 03/17/2018   Procedure: EXPLORATORY LAPAROTOMY;  Surgeon: Aviva Signs, MD;  Location: [facility];  Service: General;  Laterality: N/A;      Social History:       Social History   Tobacco Use  . Smoking status: Current Every Day Smoker    Packs/day: 1.00    Years: 27.00    Pack years: 27.00    Types: Cigarettes  . Smokeless tobacco: Never Used  Substance Use Topics  . Alcohol use: No       Family History :     Family History  Problem Relation Age of Onset  . Diabetes Father   . Cancer Father        lung cancer  . Heart disease Father   . Heart disease Maternal Grandmother   . Stroke Maternal Grandfather   . Diabetes Paternal Grandmother   . Colon cancer Neg Hx       Home Medications:   Prior to Admission medications   Medication Sig Start Date End Date Taking? Authorizing Provider  acetaminophen (TYLENOL) 500 MG tablet Take 1,500 mg by mouth 2 (two) times daily as needed for headache.    Yes [provider]  ALPRAZolam (XANAX) 0.5 MG tablet TAKE 1 TABLET BY MOUTH TWICE DAILY AS NEEDED FOR ANXIETY. Patient taking differently: Take 0.5 mg by mouth 2 (two) times daily as needed.  03/26/18  Yes Martin, Mary-Margaret, FNP  escitalopram (LEXAPRO) 10 MG tablet Take 1 tablet (10 mg total) by mouth daily. 01/28/18  Yes Hassell Done, Mary-Margaret, FNP  oxyCODONE-acetaminophen (PERCOCET) 7.5-325 MG tablet Take 1 tablet by mouth every 6 (six) hours as needed for severe pain. 03/27/18 03/27/19 Yes Aviva Signs, MD  pantoprazole (PROTONIX) 40 MG tablet Take 1 tablet (40 mg total) by mouth 2 (two) times daily before a meal. 01/28/18  Yes Hassell Done, Mary-Margaret, FNP  tiZANidine (ZANAFLEX) 4 MG tablet TAKE 1 TABLET EVERY 6 HOURS AS NEEDED FOR MUSCLE SPASMS Patient taking differently: Take 4 mg by mouth every 6 (six) hours as needed for muscle spasms.  01/28/18  Yes Hassell Done Mary-Margaret, FNP     Allergies:     Allergies  Allergen Reactions  . Hctz [Hydrochlorothiazide] Diarrhea  . Morphine Other (See Comments)    Flushing, turns skin red   . Zoloft [Sertraline Hcl] Other (See Comments)    Spaced out     Physical Exam:    Vitals  Blood pressure (!) 127/101, pulse 94, temperature 98.6 F (37 C), temperature source Oral, resp. rate (!) 29, height 5\' 1"  (1.549 m), weight 60.3 kg, SpO2 98 %.  1.  General: Appears in no acute distress  2. Psychiatric:  Intact judgement and  insight, awake alert, oriented x 3.  3. Neurologic: No focal neurological deficits, all cranial nerves intact.Strength 5/5 all 4 extremities, sensation intact all 4 extremities, plantars down going.  4. Eyes :  anicteric sclerae, moist conjunctivae with no lid lag. PERRLA.  5. ENMT:  Oropharynx clear with moist mucous membranes and good dentition  6. Neck:  supple, no cervical lymphadenopathy appriciated, No thyromegaly  7. Respiratory : Normal respiratory effort, good air movement bilaterally,clear to  auscultation bilaterally  8. Cardiovascular : RRR, no gallops, rubs or murmurs, no leg edema  9. Gastrointestinal:  Positive bowel sounds, abdomen soft, positive epigastric tenderness to palpation, no rigidity or guarding.     10. Skin:  No cyanosis, normal texture and turgor, no rash, lesions or ulcers  11.Musculoskeletal:  Good muscle tone,  joints appear normal , no effusions,  normal range of motion    Data Review:    CBC Recent Labs  Lab 04/05/18 1601  WBC 14.0*  HGB 13.1  HCT 39.1  PLT 818*  MCV 99.7  MCH 33.4  MCHC 33.5  RDW 13.4  LYMPHSABS 2.4  MONOABS 0.6  EOSABS 0.0  BASOSABS 0.0   ------------------------------------------------------------------------------------------------------------------  Chemistries  Recent Labs  Lab 04/05/18 1601  NA 145  K 2.8*  CL 98  CO2 32  GLUCOSE 112*  BUN 21*  CREATININE 0.66  CALCIUM 9.2  MG 2.2  AST 18  ALT 20  ALKPHOS 148*  BILITOT 0.6    ------------------------------------------------------------------------------------------------------------------  ------------------------------------------------------------------------------------------------------------------ GFR: Estimated Creatinine Clearance: 74 mL/min (by C-G formula based on SCr of 0.66 mg/dL). Liver Function Tests: Recent Labs  Lab 04/05/18 1601  AST 18  ALT 20  ALKPHOS 148*  BILITOT 0.6  PROT 9.1*  ALBUMIN 3.6   Recent Labs  Lab 04/05/18 1601  LIPASE 36   No results for input(s): AMMONIA in the last 168 hours. Coagulation Profile: No results for input(s): INR, PROTIME in the last 168 hours. Cardiac Enzymes: Recent Labs  Lab 04/05/18 1601  TROPONINI <0.03   BNP (last 3 results) No results for input(s): PROBNP in the last 8760 hours. HbA1C: No results for input(s): HGBA1C in the last 72 hours. CBG: No results for input(s): GLUCAP in the last 168 hours. Lipid Profile: No results for input(s): CHOL, HDL, LDLCALC, TRIG, CHOLHDL, LDLDIRECT in the last 72 hours. Thyroid Function Tests: No results for input(s): TSH, T4TOTAL, FREET4, T3FREE, THYROIDAB in the last 72 hours. Anemia Panel: No results for input(s): VITAMINB12, FOLATE, FERRITIN, TIBC, IRON, RETICCTPCT in the last 72 hours.  --------------------------------------------------------------------------------------------------------------- Urine analysis:    Component Value Date/Time   COLORURINE YELLOW 04/05/2018 2031   APPEARANCEUR CLEAR 04/05/2018 2031   APPEARANCEUR Clear 03/07/2016 1046   LABSPEC >1.046 (H) 04/05/2018 2031   PHURINE 8.0 04/05/2018 2031   GLUCOSEU NEGATIVE 04/05/2018 2031   HGBUR NEGATIVE 04/05/2018 2031   BILIRUBINUR NEGATIVE 04/05/2018 2031   BILIRUBINUR Negative 03/07/2016 1046   KETONESUR 20 (A) 04/05/2018 2031   PROTEINUR 100 (A) 04/05/2018 2031   UROBILINOGEN 0.2 03/12/2010 2120   NITRITE NEGATIVE 04/05/2018 2031   LEUKOCYTESUR NEGATIVE 04/05/2018  2031   LEUKOCYTESUR Negative 03/07/2016 1046      Imaging Results:    Dg Chest 2 View  Result Date: 04/05/2018 CLINICAL DATA:  Abdominal pain. Nausea and vomiting. Recent gastric perforation. EXAM: CHEST - 2 VIEW COMPARISON:  Under GI study 03/25/2018. Two-view abdomen 03/22/2018. CT of the abdomen 03/17/2018. FINDINGS: The heart size is normal. Right greater than left basilar airspace disease likely reflects atelectasis. No edema or effusion is present. Pneumoperitoneum is again seen under the right hemidiaphragm. Rightward curvature is present in the thoracic spine. IMPRESSION: 1. Pneumoperitoneum. 2. Asymmetric right lower lobe airspace disease. While this likely reflects atelectasis, infection is not excluded. These results were called by telephone at the time of interpretation on 04/05/2018 at 5:01 pm to Dr. Francine Graven , who verbally acknowledged these results. Electronically Signed   By: San Morelle M.D.   On: 04/05/2018 17:03   Ct Abdomen Pelvis W Contrast  Result Date: 04/05/2018 CLINICAL DATA:  Vomiting diarrhea and nausea EXAM: CT ABDOMEN AND PELVIS WITH CONTRAST TECHNIQUE: Multidetector CT imaging of the abdomen and pelvis was performed using the standard protocol following bolus administration of intravenous contrast. CONTRAST:  45mL ISOVUE-300 IOPAMIDOL (ISOVUE-300) INJECTION 61%, 173mL ISOVUE-300 IOPAMIDOL (ISOVUE-300) INJECTION 61% COMPARISON:  03/25/2018 fluoroscopic images, CT 03/17/2018, 05/01/2017 chest x-ray 04/05/2018 FINDINGS: Lower chest: Lung bases demonstrate atelectasis at the right base. The heart size is within normal limits. Hepatobiliary: No focal liver abnormality is seen. Status post cholecystectomy. No biliary dilatation. Pancreas: Unremarkable. No pancreatic ductal dilatation or surrounding inflammatory changes. Spleen: Normal in size without focal abnormality. Adrenals/Urinary Tract: Adrenal glands are unremarkable. Kidneys are normal, without renal  calculi, focal lesion, or hydronephrosis. Bladder is unremarkable. Stomach/Bowel: Small fluid at the pre-pyloric region of the stomach, series 2, image number 34 without surrounding gas. Fluid collection along the posterior aspect of the distal stomach now measures 27 x 14 mm, as compared with 6.9 cm previously. Residual edema and soft tissue stranding in the upper abdominal mesentery. No dilated small bowel. No colon wall thickening. Vascular/Lymphatic: Nonaneurysmal aorta. Few upper abdominal mesenteric lymph nodes. Reproductive: Uterus unremarkable. 21 mm rounded fluid collection right posterior pelvis may reflect a small adnexal cyst. Other: Small moderate volume of free air within the upper abdomen. Infiltration of the omentum and anterior mesentery. 2 cm gas and fluid collection within the mid abdominal mesentery slightly to the left of midline consistent  with a small abscess. Musculoskeletal: No acute or significant osseous findings. IMPRESSION: 1. Moderate pneumoperitoneum. Given that patient's surgery was 03/17/2018, this is not felt related to postoperative air. Findings are concerning for either recurrent or new perforation. Given upper abdominal predominance of air, favor proximal source, possibly stomach. There is some fluid at the pre-pyloric region of the stomach but without appreciable gas collection in the region. 2. Resolving rim enhancing fluid collection along the posterior aspect of the stomach, now measuring 2.7 cm maximum diameter compared with 6.9 cm previously. 3. New small fluid and gas containing collection measuring 2 cm within the mid abdominal mesentery to the left of midline concerning for a small abscess. Moderate edema and inflammatory change within the anterior abdominal mesentery. Critical Value/emergent results were called by telephone at the time of interpretation on 04/05/2018 at 8:36 pm to Dr. Francine Graven , who verbally acknowledged these results. Electronically Signed    By: Donavan Foil M.D.   On: 04/05/2018 20:37    My personal review of EKG: Rhythm NSR, QTc 506   Assessment & Plan:    Active Problems:   Intractable vomiting   1. Pneumoperitoneum-CT scan of the abdomen showed moderate pneumoperitoneum, patient had surgery on 03/17/2018 but it was not felt to be postoperative air.  Finding concerning for recurrent or new perforation.  General surgery was consulted, Dr. Arnoldo Morale recommended n.p.o., IV fluids, IV Zosyn.  He will see patient in a.m.  Lactic acid 0.8 2. Intractable vomiting-we will start patient on Zofran as needed for intractable nausea vomiting.  Normal saline at 100 mL/h 3. Chronic pain syndrome-start IV Dilaudid 1 mg every 4 hours PRN for pain 4. Hypokalemia-potassium was 2.8, start IV KCl 10 mEq x 3.  Follow BMP in am. 5. QTC prolongation-patient has QTC prolonged 506 likely from hypokalemia.  Also check serum magnesium.  Monitor on telemetry.   DVT Prophylaxis-   Lovenox   AM Labs Ordered, also please review Full Orders  Family Communication: Admission, patients condition and plan of care including tests being ordered have been discussed with the patient  who indicate understanding and agree with the plan and Code Status.  Code Status: Full code  Admission status: Inpatient  Time spent in minutes : 60 min   Oswald Hillock M.D on 04/05/2018 at 10:21 PM  Between 7am to 7pm - Pager - [phone]. After 7pm go to [email or web address] - password [facility]  [facility] - Office  [phone]

## 2018-04-05 NOTE — ED Provider Notes (Signed)
[facility] EMERGENCY DEPARTMENT Provider Note   CSN: [id number] Arrival date & time: 04/05/18  1537     History   Chief Complaint Chief Complaint  Patient presents with  . Abdominal Pain    HPI Tiffany Stewart is a 47 y.o. female.  HPI Pt was seen at 1555.   Per pt, c/o gradual onset and persistence of constant generalized abd "pain" since yesterday.  Has been associated with multiple intermittent episodes of N/V/D that began today.  Describes the abd pain as "aching." Pt s/p gastrorrhaphy for gastric ulcer perforation 03/17/2018.  Denies fevers, no back pain, no rash, no CP/SOB, no black or blood in stools or emesis.      Past Medical History:  Diagnosis Date  . Anxiety   . Chronic pain   . Depression   . GERD (gastroesophageal reflux disease)   . HSP (hereditary spastic paraplegia) ([place])   . Migraines   . Perforated peptic ulcer ([place]) 03/17/2018    Patient Active Problem List   Diagnosis Date Noted  . Perforated peptic ulcer ([place]) 03/17/2018  . Irritable bowel syndrome 06/04/2017  . Baclofen overdose 02/09/2017  . Protein-calorie malnutrition, severe 11/15/2016  . Gastric perforation ([place]) 11/14/2016  . Iron deficiency anemia 11/13/2016  . HSP (hereditary spastic paraplegia) ([place]) 04/27/2014  . TOBACCO ABUSE 11/11/2009  . Anxiety state 03/06/2008  . GERD 03/06/2008    Past Surgical History:  Procedure Laterality Date  . CESAREAN SECTION    . CHOLECYSTECTOMY    . ENDOMETRIAL ABLATION  2000  . GASTRORRHAPHY  03/17/2018   Procedure: GASTRORRHAPHY;  Surgeon: Aviva Signs, MD;  Location: [facility];  Service: General;;  . HERNIA REPAIR    . LAPAROTOMY N/A 03/17/2018   Procedure: EXPLORATORY LAPAROTOMY;  Surgeon: Aviva Signs, MD;  Location: [facility];  Service: General;  Laterality: N/A;     OB History   None      Home Medications    Prior to Admission medications   Medication Sig Start Date End Date Taking? Authorizing Provider  acetaminophen (TYLENOL) 500 MG  tablet Take 1,500 mg by mouth every 6 (six) hours as needed for moderate pain.    [provider]  ALPRAZolam (XANAX) 0.5 MG tablet TAKE 1 TABLET BY MOUTH TWICE DAILY AS NEEDED FOR ANXIETY. 03/26/18   Hassell Done, Mary-Margaret, FNP  escitalopram (LEXAPRO) 10 MG tablet Take 1 tablet (10 mg total) by mouth daily. 01/28/18   Hassell Done, Mary-Margaret, FNP  oxyCODONE-acetaminophen (PERCOCET) 7.5-325 MG tablet Take 1 tablet by mouth every 6 (six) hours as needed for severe pain. 03/27/18 03/27/19  Aviva Signs, MD  pantoprazole (PROTONIX) 40 MG tablet Take 1 tablet (40 mg total) by mouth 2 (two) times daily before a meal. 01/28/18   Hassell Done, Mary-Margaret, FNP  tiZANidine (ZANAFLEX) 4 MG tablet TAKE 1 TABLET EVERY 6 HOURS AS NEEDED FOR MUSCLE SPASMS 01/28/18   Chevis Pretty, FNP    Family History Family History  Problem Relation Age of Onset  . Diabetes Father   . Cancer Father        lung cancer  . Heart disease Father   . Heart disease Maternal Grandmother   . Stroke Maternal Grandfather   . Diabetes Paternal Grandmother   . Colon cancer Neg Hx     Social History Social History   Tobacco Use  . Smoking status: Current Every Day Smoker    Packs/day: 1.00    Years: 27.00    Pack years: 27.00    Types: Cigarettes  .  Smokeless tobacco: Never Used  Substance Use Topics  . Alcohol use: No  . Drug use: No     Allergies   Hctz [hydrochlorothiazide]; Morphine; and Zoloft [sertraline hcl]   Review of Systems Review of Systems ROS: Statement: All systems negative except as marked or noted in the HPI; Constitutional: Negative for fever and chills. ; ; Eyes: Negative for eye pain, redness and discharge. ; ; ENMT: Negative for ear pain, hoarseness, nasal congestion, sinus pressure and sore throat. ; ; Cardiovascular: Negative for chest pain, palpitations, diaphoresis, dyspnea and peripheral edema. ; ; Respiratory: Negative for cough, wheezing and stridor. ; ; Gastrointestinal: +N/V/D,  abd pain. Negative for blood in stool, hematemesis, jaundice and rectal bleeding. . ; ; Genitourinary: Negative for dysuria, flank pain and hematuria. ; ; Musculoskeletal: Negative for back pain and neck pain. Negative for swelling and trauma.; ; Skin: Negative for pruritus, rash, abrasions, blisters, bruising and skin lesion.; ; Neuro: Negative for headache, lightheadedness and neck stiffness. Negative for weakness, altered level of consciousness, altered mental status, extremity weakness, paresthesias, involuntary movement, seizure and syncope.       Physical Exam Updated Vital Signs BP (!) 135/105 (BP Location: Right Arm)   Pulse 98   Temp 98.6 F (37 C) (Oral)   Resp 19   Ht 5\' 1"  (1.549 m)   Wt 60.3 kg   SpO2 100%   BMI 25.13 kg/m    Patient Vitals for the past 24 hrs:  BP Temp Temp src Pulse Resp SpO2 Height Weight  04/05/18 2031 (!) 127/101 - - 94 (!) 29 98 % - -  04/05/18 1930 105/77 - - - (!) 22 100 % - -  04/05/18 1900 (!) 117/94 - - - 20 100 % - -  04/05/18 1830 116/89 - - 96 (!) 21 96 % - -  04/05/18 1800 (!) 146/108 - - 91 (!) 45 100 % - -  04/05/18 1700 (!) 130/102 - - 98 (!) 26 96 % - -  04/05/18 1600 (!) 129/110 - - 91 20 98 % - -  04/05/18 1546 (!) 135/105 98.6 F (37 C) Oral 98 19 100 % 5\' 1"  (1.549 m) 60.3 kg     Physical Exam 1600: Physical examination:  Nursing notes reviewed; Vital signs and O2 SAT reviewed;  Constitutional: Well developed, Well nourished, Well hydrated, Uncomfortable appearing; Head:  Normocephalic, atraumatic; Eyes: EOMI, PERRL, No scleral icterus; ENMT: Mouth and pharynx normal, Mucous membranes moist; Neck: Supple, Full range of motion, No lymphadenopathy; Cardiovascular: Regular rate and rhythm, No gallop; Respiratory: Breath sounds clear & equal bilaterally, No wheezes.  Speaking full sentences with ease, Normal respiratory effort/excursion; Chest: Nontender, Movement normal; Abdomen: Soft, +diffuse tenderness to palp. +healing surgical  wound midline upper abd wall, no erythema, no open wounds, no drainage.  Nondistended, Normal bowel sounds; Genitourinary: No CVA tenderness; Extremities: Peripheral pulses normal, No tenderness, No edema, No calf edema or asymmetry.; Neuro: AA&Ox3, Major CN grossly intact.  Speech clear. +contracted bilat LE's per hx, otherwise no new gross focal motor deficits in extremities.; Skin: Color normal, Warm, Dry.   ED Treatments / Results  Labs (all labs ordered are listed, but only abnormal results are displayed)   EKG EKG Interpretation  Date/Time:  Friday April 05 2018 16:31:01 EDT Ventricular Rate:  92 PR Interval:    QRS Duration: 96 QT Interval:  409 QTC Calculation: 506 R Axis:   67 Text Interpretation:  Sinus rhythm Borderline repolarization abnormality Borderline prolonged QT  interval Baseline wander When compared with ECG of 03/20/2018 Rate slower Otherwise no significant change Confirmed by Francine Graven [phone]) on 04/05/2018 4:35:59 PM   Radiology   Procedures Procedures (including critical care time)  Medications Ordered in ED Medications  0.9 %  sodium chloride infusion ( Intravenous New Bag/Given 04/05/18 1631)  fentaNYL (SUBLIMAZE) injection 50 mcg (50 mcg Intravenous Given 04/05/18 2042)  ondansetron (ZOFRAN) injection 4 mg (4 mg Intravenous Given 04/05/18 1759)  fentaNYL (SUBLIMAZE) injection 50 mcg (50 mcg Intravenous Given 04/05/18 1704)  potassium chloride 10 mEq in 100 mL IVPB (0 mEq Intravenous Stopped 04/05/18 2038)  famotidine (PEPCID) IVPB 20 mg premix (0 mg Intravenous Stopped 04/05/18 2107)  iopamidol (ISOVUE-300) 61 % injection 50 mL (50 mLs Oral Contrast Given 04/05/18 1950)  iopamidol (ISOVUE-300) 61 % injection 100 mL (100 mLs Intravenous Contrast Given 04/05/18 1951)  piperacillin-tazobactam (ZOSYN) IVPB 3.375 g (0 g Intravenous Stopped 04/05/18 2131)     Initial Impression / Assessment and Plan / ED Course  I have reviewed the triage vital signs and  the nursing notes.  Pertinent labs & imaging results that were available during my care of the patient were reviewed by me and considered in my medical decision making (see chart for details).  MDM Reviewed: previous chart, nursing note and vitals Reviewed previous: labs and ECG Interpretation: labs, ECG, x-ray and CT scan Total time providing critical care: 30-74 minutes. This excludes time spent performing separately reportable procedures and services. Consults: general surgery and admitting MD   CRITICAL CARE Performed by: Francine Graven Total critical care time: 35 minutes Critical care time was exclusive of separately billable procedures and treating other patients. Critical care was necessary to treat or prevent imminent or life-threatening deterioration. Critical care was time spent personally by me on the following activities: development of treatment plan with patient and/or surrogate as well as nursing, discussions with consultants, evaluation of patient's response to treatment, examination of patient, obtaining history from patient or surrogate, ordering and performing treatments and interventions, ordering and review of laboratory studies, ordering and review of radiographic studies, pulse oximetry and re-evaluation of patient's condition.  Results for orders placed or performed during the hospital encounter of 04/05/18  Comprehensive metabolic panel  Result Value Ref Range   Sodium 145 135 - 145 mmol/L   Potassium 2.8 (L) 3.5 - 5.1 mmol/L   Chloride 98 98 - 111 mmol/L   CO2 32 22 - 32 mmol/L   Glucose, Bld 112 (H) 70 - 99 mg/dL   BUN 21 (H) 6 - 20 mg/dL   Creatinine, Ser 0.66 0.44 - 1.00 mg/dL   Calcium 9.2 8.9 - 10.3 mg/dL   Total Protein 9.1 (H) 6.5 - 8.1 g/dL   Albumin 3.6 3.5 - 5.0 g/dL   AST 18 15 - 41 U/L   ALT 20 0 - 44 U/L   Alkaline Phosphatase 148 (H) 38 - 126 U/L   Total Bilirubin 0.6 0.3 - 1.2 mg/dL   GFR calc non Af Amer >60 >60 mL/min   GFR calc Af  Amer >60 >60 mL/min   Anion gap 15 5 - 15  Lipase, blood  Result Value Ref Range   Lipase 36 11 - 51 U/L  Troponin I  Result Value Ref Range   Troponin I <0.03 <0.03 ng/mL  Lactic acid, plasma  Result Value Ref Range   Lactic Acid, Venous 0.8 0.5 - 1.9 mmol/L  CBC with Differential  Result Value Ref Range   WBC  14.0 (H) 4.0 - 10.5 K/uL   RBC 3.92 3.87 - 5.11 MIL/uL   Hemoglobin 13.1 12.0 - 15.0 g/dL   HCT 39.1 36.0 - 46.0 %   MCV 99.7 78.0 - 100.0 fL   MCH 33.4 26.0 - 34.0 pg   MCHC 33.5 30.0 - 36.0 g/dL   RDW 13.4 11.5 - 15.5 %   Platelets 818 (H) 150 - 400 K/uL   Neutrophils Relative % 79 %   Neutro Abs 11.1 (H) 1.7 - 7.7 K/uL   Lymphocytes Relative 17 %   Lymphs Abs 2.4 0.7 - 4.0 K/uL   Monocytes Relative 4 %   Monocytes Absolute 0.6 0.1 - 1.0 K/uL   Eosinophils Relative 0 %   Eosinophils Absolute 0.0 0.0 - 0.7 K/uL   Basophils Relative 0 %   Basophils Absolute 0.0 0.0 - 0.1 K/uL  Urinalysis, Routine w reflex microscopic  Result Value Ref Range   Color, Urine YELLOW YELLOW   APPearance CLEAR CLEAR   Specific Gravity, Urine >1.046 (H) 1.005 - 1.030   pH 8.0 5.0 - 8.0   Glucose, UA NEGATIVE NEGATIVE mg/dL   Hgb urine dipstick NEGATIVE NEGATIVE   Bilirubin Urine NEGATIVE NEGATIVE   Ketones, ur 20 (A) NEGATIVE mg/dL   Protein, ur 100 (A) NEGATIVE mg/dL   Nitrite NEGATIVE NEGATIVE   Leukocytes, UA NEGATIVE NEGATIVE   RBC / HPF 0-5 0 - 5 RBC/hpf   WBC, UA 0-5 0 - 5 WBC/hpf   Bacteria, UA NONE SEEN NONE SEEN   Squamous Epithelial / LPF 0-5 0 - 5   Mucus PRESENT   Magnesium  Result Value Ref Range   Magnesium 2.2 1.7 - 2.4 mg/dL  Lactic acid, plasma  Result Value Ref Range   Lactic Acid, Venous 0.9 0.5 - 1.9 mmol/L   Dg Chest 2 View Result Date: 04/05/2018 CLINICAL DATA:  Abdominal pain. Nausea and vomiting. Recent gastric perforation. EXAM: CHEST - 2 VIEW COMPARISON:  Under GI study 03/25/2018. Two-view abdomen 03/22/2018. CT of the abdomen 03/17/2018. FINDINGS:  The heart size is normal. Right greater than left basilar airspace disease likely reflects atelectasis. No edema or effusion is present. Pneumoperitoneum is again seen under the right hemidiaphragm. Rightward curvature is present in the thoracic spine. IMPRESSION: 1. Pneumoperitoneum. 2. Asymmetric right lower lobe airspace disease. While this likely reflects atelectasis, infection is not excluded. These results were called by telephone at the time of interpretation on 04/05/2018 at 5:01 pm to Dr. Francine Graven , who verbally acknowledged these results. Electronically Signed   By: San Morelle M.D.   On: 04/05/2018 17:03    Ct Abdomen Pelvis W Contrast Result Date: 04/05/2018 CLINICAL DATA:  Vomiting diarrhea and nausea EXAM: CT ABDOMEN AND PELVIS WITH CONTRAST TECHNIQUE: Multidetector CT imaging of the abdomen and pelvis was performed using the standard protocol following bolus administration of intravenous contrast. CONTRAST:  75mL ISOVUE-300 IOPAMIDOL (ISOVUE-300) INJECTION 61%, 171mL ISOVUE-300 IOPAMIDOL (ISOVUE-300) INJECTION 61% COMPARISON:  03/25/2018 fluoroscopic images, CT 03/17/2018, 05/01/2017 chest x-ray 04/05/2018 FINDINGS: Lower chest: Lung bases demonstrate atelectasis at the right base. The heart size is within normal limits. Hepatobiliary: No focal liver abnormality is seen. Status post cholecystectomy. No biliary dilatation. Pancreas: Unremarkable. No pancreatic ductal dilatation or surrounding inflammatory changes. Spleen: Normal in size without focal abnormality. Adrenals/Urinary Tract: Adrenal glands are unremarkable. Kidneys are normal, without renal calculi, focal lesion, or hydronephrosis. Bladder is unremarkable. Stomach/Bowel: Small fluid at the pre-pyloric region of the stomach, series 2, image number 34  without surrounding gas. Fluid collection along the posterior aspect of the distal stomach now measures 27 x 14 mm, as compared with 6.9 cm previously. Residual edema and  soft tissue stranding in the upper abdominal mesentery. No dilated small bowel. No colon wall thickening. Vascular/Lymphatic: Nonaneurysmal aorta. Few upper abdominal mesenteric lymph nodes. Reproductive: Uterus unremarkable. 21 mm rounded fluid collection right posterior pelvis may reflect a small adnexal cyst. Other: Small moderate volume of free air within the upper abdomen. Infiltration of the omentum and anterior mesentery. 2 cm gas and fluid collection within the mid abdominal mesentery slightly to the left of midline consistent with a small abscess. Musculoskeletal: No acute or significant osseous findings. IMPRESSION: 1. Moderate pneumoperitoneum. Given that patient's surgery was 03/17/2018, this is not felt related to postoperative air. Findings are concerning for either recurrent or new perforation. Given upper abdominal predominance of air, favor proximal source, possibly stomach. There is some fluid at the pre-pyloric region of the stomach but without appreciable gas collection in the region. 2. Resolving rim enhancing fluid collection along the posterior aspect of the stomach, now measuring 2.7 cm maximum diameter compared with 6.9 cm previously. 3. New small fluid and gas containing collection measuring 2 cm within the mid abdominal mesentery to the left of midline concerning for a small abscess. Moderate edema and inflammatory change within the anterior abdominal mesentery. Critical Value/emergent results were called by telephone at the time of interpretation on 04/05/2018 at 8:36 pm to Dr. Francine Graven , who verbally acknowledged these results. Electronically Signed   By: Donavan Foil M.D.   On: 04/05/2018 20:37    2035:  CT as above; IV abx started. IV pepcid already given. Pt will remain NPO. T/C returned from General Surgery Dr. Arnoldo Morale, case discussed, including:  HPI, pertinent PM/SHx, VS/PE, dx testing, ED course and treatment:  Agreeable to consult, no acute surgical issue at this time,  requests to keep NPO, continue IVF, pepcid, and abx.   2100:  T/C returned from [facility] Dr. Darrick Meigs, case discussed, including:  HPI, pertinent PM/SHx, VS/PE, dx testing, ED course and treatment, as well as d/w General Surgeon: Agreeable to admit.     Final Clinical Impressions(s) / ED Diagnoses   Final diagnoses:  None    ED Discharge Orders    None       Francine Graven, DO 04/10/18 2340

## 2018-04-06 ENCOUNTER — Encounter (HOSPITAL_COMMUNITY): Payer: Self-pay | Admitting: Family Medicine

## 2018-04-06 DIAGNOSIS — K255 Chronic or unspecified gastric ulcer with perforation: Secondary | ICD-10-CM

## 2018-04-06 DIAGNOSIS — R1084 Generalized abdominal pain: Secondary | ICD-10-CM

## 2018-04-06 DIAGNOSIS — E876 Hypokalemia: Secondary | ICD-10-CM

## 2018-04-06 DIAGNOSIS — R112 Nausea with vomiting, unspecified: Secondary | ICD-10-CM

## 2018-04-06 LAB — COMPREHENSIVE METABOLIC PANEL
ALBUMIN: 2.8 g/dL — AB (ref 3.5–5.0)
ALT: 15 U/L (ref 0–44)
AST: 17 U/L (ref 15–41)
Alkaline Phosphatase: 114 U/L (ref 38–126)
Anion gap: 9 (ref 5–15)
BUN: 16 mg/dL (ref 6–20)
CHLORIDE: 105 mmol/L (ref 98–111)
CO2: 26 mmol/L (ref 22–32)
Calcium: 8.2 mg/dL — ABNORMAL LOW (ref 8.9–10.3)
Creatinine, Ser: 0.58 mg/dL (ref 0.44–1.00)
GFR calc Af Amer: >60 mL/min (ref >60)
GFR calc non Af Amer: >60 mL/min (ref >60)
GLUCOSE: 84 mg/dL (ref 70–99)
Potassium: 3.4 mmol/L — ABNORMAL LOW (ref 3.5–5.1)
SODIUM: 140 mmol/L (ref 135–145)
Total Bilirubin: 0.4 mg/dL (ref 0.3–1.2)
Total Protein: 6.9 g/dL (ref 6.5–8.1)

## 2018-04-06 LAB — MAGNESIUM: Magnesium: 1.9 mg/dL (ref 1.7–2.4)

## 2018-04-06 LAB — CBC
HCT: 34.6 % — ABNORMAL LOW (ref 36.0–46.0)
Hemoglobin: 11.2 g/dL — ABNORMAL LOW (ref 12.0–15.0)
MCH: 32.6 pg (ref 26.0–34.0)
MCHC: 32.4 g/dL (ref 30.0–36.0)
MCV: 100.6 fL — ABNORMAL HIGH (ref 78.0–100.0)
PLATELETS: 646 10*3/uL — AB (ref 150–400)
RBC: 3.44 MIL/uL — AB (ref 3.87–5.11)
RDW: 13.6 % (ref 11.5–15.5)
WBC: 12.7 10*3/uL — AB (ref 4.0–10.5)

## 2018-04-06 LAB — PHOSPHORUS: PHOSPHORUS: 3.5 mg/dL (ref 2.5–4.6)

## 2018-04-06 MED ORDER — FAMOTIDINE IN NACL 20-0.9 MG/50ML-% IV SOLN
20.0000 mg | Freq: Two times a day (BID) | INTRAVENOUS | Status: AC
  Administered 2018-04-06 – 2018-04-07 (×3): 20 mg via INTRAVENOUS
  Filled 2018-04-06 (×3): qty 50

## 2018-04-06 MED ORDER — POTASSIUM CHLORIDE 10 MEQ/100ML IV SOLN
10.0000 meq | INTRAVENOUS | Status: AC
  Administered 2018-04-06 (×2): 10 meq via INTRAVENOUS
  Filled 2018-04-06: qty 100

## 2018-04-06 MED ORDER — NICOTINE 21 MG/24HR TD PT24: 21.0000 mg | MEDICATED_PATCH | Freq: Every day | TRANSDERMAL | Status: DC | PRN

## 2018-04-06 MED ORDER — PIPERACILLIN-TAZOBACTAM 3.375 G IVPB
3.3750 g | Freq: Three times a day (TID) | INTRAVENOUS | Status: DC
  Administered 2018-04-06 – 2018-04-10 (×13): 3.375 g via INTRAVENOUS
  Filled 2018-04-06 (×13): qty 50

## 2018-04-06 MED ORDER — HYDROMORPHONE HCL 1 MG/ML IJ SOLN
1.0000 mg | INTRAMUSCULAR | Status: DC | PRN
  Administered 2018-04-06 – 2018-04-11 (×25): 1 mg via INTRAVENOUS
  Filled 2018-04-06 (×25): qty 1

## 2018-04-06 NOTE — Consult Note (Signed)
Reason for Consult: Abdominal pain Referring Physician: Dr. Algis Downs is an 47 y.o. female.  HPI: Patient is a 47 year old white female status post gastrorrhaphy for perforated peptic ulcer disease due to [place] powder abuse on 03/17/2018 whose postoperative course was complicated by a delay in the ceiling of the perforation.  She was discharged from the hospital on 03/27/2018 in good and improving condition.  She states that yesterday she started having acute onset of abdominal pain, nausea, vomiting.  She presented back to the emergency room and was noted to have pneumoperitoneum that was more extensive than what one would expect after surgery.  Interestingly, the posterior gastric fluid collection that was present from the contained leak was actually smaller in size.  Patient was admitted to the hospital for further evaluation and treatment.  This morning, she states that her pain is better.  She does not have nausea.  She denies that she took any [place] powders.  She does not look in extremis.  Past Medical History:  Diagnosis Date  . Anxiety   . Chronic pain   . Depression   . GERD (gastroesophageal reflux disease)   . HSP (hereditary spastic paraplegia) ([place])   . Migraines   . Perforated peptic ulcer ([place]) 03/17/2018    Past Surgical History:  Procedure Laterality Date  . CESAREAN SECTION    . CHOLECYSTECTOMY    . ENDOMETRIAL ABLATION  2000  . GASTRORRHAPHY  03/17/2018   Procedure: GASTRORRHAPHY;  Surgeon: Aviva Signs, MD;  Location: [facility];  Service: General;;  . HERNIA REPAIR    . LAPAROTOMY N/A 03/17/2018   Procedure: EXPLORATORY LAPAROTOMY;  Surgeon: Aviva Signs, MD;  Location: [facility];  Service: General;  Laterality: N/A;    Family History  Problem Relation Age of Onset  . Diabetes Father   . Cancer Father        lung cancer  . Heart disease Father   . Heart disease Maternal Grandmother   . Stroke Maternal Grandfather   . Diabetes Paternal Grandmother   . Colon  cancer Neg Hx     Social History:  reports that she has been smoking cigarettes. She has a 27.00 pack-year smoking history. She has never used smokeless tobacco. She reports that she does not drink alcohol or use drugs.  Allergies:  Allergies  Allergen Reactions  . Hctz [Hydrochlorothiazide] Diarrhea  . Morphine Other (See Comments)    Flushing, turns skin red   . Zoloft [Sertraline Hcl] Other (See Comments)    Spaced out    Medications: I have reviewed the patient's current medications.  Results for orders placed or performed during the hospital encounter of 04/05/18 (from the past 48 hour(s))  Comprehensive metabolic panel     Status: Abnormal   Collection Time: 04/05/18  4:01 PM  Result Value Ref Range   Sodium 145 135 - 145 mmol/L   Potassium 2.8 (L) 3.5 - 5.1 mmol/L   Chloride 98 98 - 111 mmol/L   CO2 32 22 - 32 mmol/L   Glucose, Bld 112 (H) 70 - 99 mg/dL   BUN 21 (H) 6 - 20 mg/dL   Creatinine, Ser 0.66 0.44 - 1.00 mg/dL   Calcium 9.2 8.9 - 10.3 mg/dL   Total Protein 9.1 (H) 6.5 - 8.1 g/dL   Albumin 3.6 3.5 - 5.0 g/dL   AST 18 15 - 41 U/L   ALT 20 0 - 44 U/L   Alkaline Phosphatase 148 (H) 38 - 126 U/L  Total Bilirubin 0.6 0.3 - 1.2 mg/dL   GFR calc non Af Amer >60 >60 mL/min   GFR calc Af Amer >60 >60 mL/min    Comment: (NOTE) The eGFR has been calculated using the CKD EPI equation. This calculation has not been validated in all clinical situations. eGFR's persistently <60 mL/min signify possible Chronic Kidney Disease.    Anion gap 15 5 - 15    Comment: Performed at [facility], [address], [place], [place] [address]  Lipase, blood     Status: None   Collection Time: 04/05/18  4:01 PM  Result Value Ref Range   Lipase 36 11 - 51 U/L    Comment: Performed at [facility], [address] [place], [place] [address]  Troponin I     Status: None   Collection Time: 04/05/18  4:01 PM  Result Value Ref Range   Troponin I <0.03 <0.03 ng/mL    Comment: Performed  at [facility], [address] [place], [place] [address]  CBC with Differential     Status: Abnormal   Collection Time: 04/05/18  4:01 PM  Result Value Ref Range   WBC 14.0 (H) 4.0 - 10.5 K/uL   RBC 3.92 3.87 - 5.11 MIL/uL   Hemoglobin 13.1 12.0 - 15.0 g/dL   HCT 39.1 36.0 - 46.0 %   MCV 99.7 78.0 - 100.0 fL   MCH 33.4 26.0 - 34.0 pg   MCHC 33.5 30.0 - 36.0 g/dL   RDW 13.4 11.5 - 15.5 %   Platelets 818 (H) 150 - 400 K/uL   Neutrophils Relative % 79 %   Neutro Abs 11.1 (H) 1.7 - 7.7 K/uL   Lymphocytes Relative 17 %   Lymphs Abs 2.4 0.7 - 4.0 K/uL   Monocytes Relative 4 %   Monocytes Absolute 0.6 0.1 - 1.0 K/uL   Eosinophils Relative 0 %   Eosinophils Absolute 0.0 0.0 - 0.7 K/uL   Basophils Relative 0 %   Basophils Absolute 0.0 0.0 - 0.1 K/uL    Comment: Performed at [facility], [address], [place], [place] [address]  Magnesium     Status: None   Collection Time: 04/05/18  4:01 PM  Result Value Ref Range   Magnesium 2.2 1.7 - 2.4 mg/dL    Comment: Performed at [facility], [address], [place], [place] [address]  Lactic acid, plasma     Status: None   Collection Time: 04/05/18  4:58 PM  Result Value Ref Range   Lactic Acid, Venous 0.8 0.5 - 1.9 mmol/L    Comment: Performed at [facility], [address], [place], [place] [address]  Lactic acid, plasma     Status: None   Collection Time: 04/05/18  7:42 PM  Result Value Ref Range   Lactic Acid, Venous 0.9 0.5 - 1.9 mmol/L    Comment: Performed at [facility], [address], [place], [place] [address]  Magnesium     Status: None   Collection Time: 04/05/18  7:42 PM  Result Value Ref Range   Magnesium 2.3 1.7 - 2.4 mg/dL    Comment: Performed at [facility], [address] [place],  [address]  Urinalysis, Routine w reflex microscopic     Status: Abnormal   Collection Time: 04/05/18  8:31 PM  Result Value Ref Range   Color, Urine YELLOW YELLOW   APPearance CLEAR CLEAR   Specific Gravity, Urine >1.046 (H)  1.005 - 1.030   pH 8.0 5.0 -  8.0   Glucose, UA NEGATIVE NEGATIVE mg/dL   Hgb urine dipstick NEGATIVE NEGATIVE   Bilirubin Urine NEGATIVE NEGATIVE   Ketones, ur 20 (A) NEGATIVE mg/dL   Protein, ur 100 (A) NEGATIVE mg/dL   Nitrite NEGATIVE NEGATIVE   Leukocytes, UA NEGATIVE NEGATIVE   RBC / HPF 0-5 0 - 5 RBC/hpf   WBC, UA 0-5 0 - 5 WBC/hpf   Bacteria, UA NONE SEEN NONE SEEN   Squamous Epithelial / LPF 0-5 0 - 5   Mucus PRESENT     Comment: Performed at [facility], [address], [place], [place] [address]  Lactic acid, plasma     Status: None   Collection Time: 04/05/18  9:37 PM  Result Value Ref Range   Lactic Acid, Venous 0.8 0.5 - 1.9 mmol/L    Comment: Performed at [facility], [address] [place], [place] [address]  CBC     Status: Abnormal   Collection Time: 04/06/18  7:06 AM  Result Value Ref Range   WBC 12.7 (H) 4.0 - 10.5 K/uL   RBC 3.44 (L) 3.87 - 5.11 MIL/uL   Hemoglobin 11.2 (L) 12.0 - 15.0 g/dL   HCT 34.6 (L) 36.0 - 46.0 %   MCV 100.6 (H) 78.0 - 100.0 fL   MCH 32.6 26.0 - 34.0 pg   MCHC 32.4 30.0 - 36.0 g/dL   RDW 13.6 11.5 - 15.5 %   Platelets 646 (H) 150 - 400 K/uL    Comment: Performed at [facility], [address] [place], [place] [address]  Comprehensive metabolic panel     Status: Abnormal   Collection Time: 04/06/18  7:06 AM  Result Value Ref Range   Sodium 140 135 - 145 mmol/L   Potassium 3.4 (L) 3.5 - 5.1 mmol/L    Comment: DELTA CHECK NOTED NO VISIBLE HEMOLYSIS    Chloride 105 98 - 111 mmol/L   CO2 26 22 - 32 mmol/L   Glucose, Bld 84 70 - 99 mg/dL   BUN 16 6 - 20 mg/dL   Creatinine, Ser 0.58 0.44 - 1.00 mg/dL   Calcium 8.2 (L) 8.9 - 10.3 mg/dL   Total Protein 6.9 6.5 - 8.1 g/dL   Albumin 2.8 (L) 3.5 - 5.0 g/dL   AST 17 15 - 41 U/L   ALT 15 0 - 44 U/L   Alkaline Phosphatase 114 38 - 126 U/L   Total Bilirubin 0.4 0.3 - 1.2 mg/dL   GFR calc non Af Amer >60 >60 mL/min   GFR calc Af Amer >60 >60 mL/min    Comment: (NOTE) The eGFR has  been calculated using the CKD EPI equation. This calculation has not been validated in all clinical situations. eGFR's persistently <60 mL/min signify possible Chronic Kidney Disease.    Anion gap 9 5 - 15    Comment: Performed at [facility], [address], [place], [place] [address]  Magnesium     Status: None   Collection Time: 04/06/18  7:06 AM  Result Value Ref Range   Magnesium 1.9 1.7 - 2.4 mg/dL    Comment: Performed at [facility], [address], [place], [place] [address]  Phosphorus     Status: None   Collection Time: 04/06/18  7:06 AM  Result Value Ref Range   Phosphorus 3.5 2.5 - 4.6 mg/dL    Comment: Performed at [facility], [address], [place], [place] [address]    Dg Chest 2 View  Result Date: 04/05/2018 CLINICAL DATA:  Abdominal pain. Nausea and vomiting. Recent gastric perforation. EXAM: CHEST - 2 VIEW COMPARISON:  Under GI study 03/25/2018. Two-view abdomen 03/22/2018. CT of the abdomen 03/17/2018. FINDINGS: The heart size is normal. Right greater than left basilar airspace disease likely reflects atelectasis. No edema or effusion is present. Pneumoperitoneum is again seen under the right hemidiaphragm. Rightward curvature is present in the thoracic spine. IMPRESSION: 1. Pneumoperitoneum. 2. Asymmetric right lower lobe airspace disease. While this likely reflects atelectasis, infection is not excluded. These results were called by telephone at the time of interpretation on 04/05/2018 at 5:01 pm to Dr. Francine Graven , who verbally acknowledged these results. Electronically Signed   By: San Morelle M.D.   On: 04/05/2018 17:03   Ct Abdomen Pelvis W Contrast  Result Date: 04/05/2018 CLINICAL DATA:  Vomiting diarrhea and nausea EXAM: CT ABDOMEN AND PELVIS WITH CONTRAST TECHNIQUE: Multidetector CT imaging of the abdomen and pelvis was performed using the standard protocol following bolus administration of intravenous contrast. CONTRAST:  21m ISOVUE-300  IOPAMIDOL (ISOVUE-300) INJECTION 61%, 1074mISOVUE-300 IOPAMIDOL (ISOVUE-300) INJECTION 61% COMPARISON:  03/25/2018 fluoroscopic images, CT 03/17/2018, 05/01/2017 chest x-ray 04/05/2018 FINDINGS: Lower chest: Lung bases demonstrate atelectasis at the right base. The heart size is within normal limits. Hepatobiliary: No focal liver abnormality is seen. Status post cholecystectomy. No biliary dilatation. Pancreas: Unremarkable. No pancreatic ductal dilatation or surrounding inflammatory changes. Spleen: Normal in size without focal abnormality. Adrenals/Urinary Tract: Adrenal glands are unremarkable. Kidneys are normal, without renal calculi, focal lesion, or hydronephrosis. Bladder is unremarkable. Stomach/Bowel: Small fluid at the pre-pyloric region of the stomach, series 2, image number 34 without surrounding gas. Fluid collection along the posterior aspect of the distal stomach now measures 27 x 14 mm, as compared with 6.9 cm previously. Residual edema and soft tissue stranding in the upper abdominal mesentery. No dilated small bowel. No colon wall thickening. Vascular/Lymphatic: Nonaneurysmal aorta. Few upper abdominal mesenteric lymph nodes. Reproductive: Uterus unremarkable. 21 mm rounded fluid collection right posterior pelvis may reflect a small adnexal cyst. Other: Small moderate volume of free air within the upper abdomen. Infiltration of the omentum and anterior mesentery. 2 cm gas and fluid collection within the mid abdominal mesentery slightly to the left of midline consistent with a small abscess. Musculoskeletal: No acute or significant osseous findings. IMPRESSION: 1. Moderate pneumoperitoneum. Given that patient's surgery was 03/17/2018, this is not felt related to postoperative air. Findings are concerning for either recurrent or new perforation. Given upper abdominal predominance of air, favor proximal source, possibly stomach. There is some fluid at the pre-pyloric region of the stomach but  without appreciable gas collection in the region. 2. Resolving rim enhancing fluid collection along the posterior aspect of the stomach, now measuring 2.7 cm maximum diameter compared with 6.9 cm previously. 3. New small fluid and gas containing collection measuring 2 cm within the mid abdominal mesentery to the left of midline concerning for a small abscess. Moderate edema and inflammatory change within the anterior abdominal mesentery. Critical Value/emergent results were called by telephone at the time of interpretation on 04/05/2018 at 8:36 pm to Dr. KAFrancine Graven who verbally acknowledged these results. Electronically Signed   By: KiDonavan Foil.D.   On: 04/05/2018 20:37    ROS:  Pertinent items are noted in HPI.  Blood pressure 108/80, pulse 87, temperature 98.4 F (36.9 C), temperature source Oral, resp. rate 20, height '5\' 1"'  (1.549 m), weight 54.1 kg, SpO2 94 %. Physical Exam: White female in no acute  distress, though she is anxious which is her baseline. Head is normocephalic, atraumatic Lungs clear to auscultation with equal breath sounds bilaterally Heart examination reveals regular rate and rhythm without S3, S4, murmurs Abdomen is somewhat tender along the upper aspect of the abdomen.  She does not have diffuse tenderness to palpation.  A well-healed surgical scars noted superiorly along the midline.  Bowel sounds are minimal. CT scan images personally reviewed  Assessment/Plan: Impression: Pneumoperitoneum, most likely secondary to recurrent leak from posterior peptic ulcer disease repair.  She does not appear to have a frank perforation causing leakage of gastric contents.  She does not need acute surgical intervention at this time. Plan: We will transfer the patient to my service.  Will start TPN and keep her n.p.o.  Unfortunately, she has resisted NG tube placement in the past.  We will give her bowel rest for the next few days and repeat the CT scan to see if the leak has  healed.  If not, she may need a partial gastrectomy.  This was explained to the patient.  Aviva Signs 04/06/2018, 11:50 AM

## 2018-04-06 NOTE — Progress Notes (Signed)
ANTIBIOTIC CONSULT NOTE-Preliminary  Pharmacy Consult for Zosyn Indication: Intra-abdominal infection  Allergies  Allergen Reactions  . Hctz [Hydrochlorothiazide] Diarrhea  . Morphine Other (See Comments)    Flushing, turns skin red   . Zoloft [Sertraline Hcl] Other (See Comments)    Spaced out    Patient Measurements: Height: 5\' 1"  (154.9 cm) Weight: 119 lb 4.3 oz (54.1 kg) IBW/kg (Calculated) : 47.8   Vital Signs: Temp: 98.4 F (36.9 C) (08/23 2351) Temp Source: Oral (08/23 2351) BP: 126/96 (08/23 2351) Pulse Rate: 84 (08/23 2351)  Labs: Recent Labs    04/05/18 1601  WBC 14.0*  HGB 13.1  PLT 818*  CREATININE 0.66    Estimated Creatinine Clearance: 67 mL/min (by C-G formula based on SCr of 0.66 mg/dL).  No results for input(s): VANCOTROUGH, VANCOPEAK, VANCORANDOM, GENTTROUGH, GENTPEAK, GENTRANDOM, TOBRATROUGH, TOBRAPEAK, TOBRARND, AMIKACINPEAK, AMIKACINTROU, AMIKACIN in the last 72 hours.   Microbiology: No results found for this or any previous visit (from the past 720 hour(s)).  Medical History: Past Medical History:  Diagnosis Date  . Anxiety   . Chronic pain   . Depression   . GERD (gastroesophageal reflux disease)   . HSP (hereditary spastic paraplegia) ([place])   . Migraines   . Perforated peptic ulcer ([place]) 03/17/2018    Medications:  Zosyn 3.375 Gm IV x 1 dose per the EDP  Assessment: 47 yo female s/p hernia surgery @2  weeks ago, seen in the Ed with complaints of abdominal pain, N.V and diarrhea. Pharmacy has been consulted for Zosyn dosing.  Goal of Therapy:  Eradicate infection  Plan:  Preliminary review of pertinent patient information completed.  Protocol will be initiated with 1 dose of Zosyn 3.375 Gm IV @ 7 hours after the initial ED dose.[facility] clinical pharmacist will complete review during morning rounds to assess patient and finalize treatment regimen if needed.  Norberto Sorenson, [facility] 04/06/2018,1:06 AM

## 2018-04-06 NOTE — Progress Notes (Signed)
Pharmacy Antibiotic Note  Tiffany Stewart is a 47 y.o. female admitted on 04/05/2018 with intra-abdominal infection.  Pharmacy has been consulted for zosyn dosing.  Plan: Zosyn 3.375g IV q8h (4 hour infusion).  F/u cxs and clinical progress Monitor V/S, labs  Height: 5\' 1"  (154.9 cm) Weight: 119 lb 4.3 oz (54.1 kg) IBW/kg (Calculated) : 47.8  Temp (24hrs), Avg:98.5 F (36.9 C), Min:98.4 F (36.9 C), Max:98.6 F (37 C)  Recent Labs  Lab 04/05/18 1601 04/05/18 1658 04/05/18 1942 04/05/18 2137 04/06/18 0706  WBC 14.0*  --   --   --  12.7*  CREATININE 0.66  --   --   --  0.58  LATICACIDVEN  --  0.8 0.9 0.8  --     Estimated Creatinine Clearance: 67 mL/min (by C-G formula based on SCr of 0.58 mg/dL).    Allergies  Allergen Reactions  . Hctz [Hydrochlorothiazide] Diarrhea  . Morphine Other (See Comments)    Flushing, turns skin red   . Zoloft [Sertraline Hcl] Other (See Comments)    Spaced out    Antimicrobials this admission: Zosyn 8/23 >>   Dose adjustments this admission: N/A  Microbiology results: No cxs  Thank you for allowing pharmacy to be a part of this patient's care.  Isac Sarna, BS Pharm D, [place] Clinical Pharmacist Pager [phone] 04/06/2018 8:33 AM

## 2018-04-06 NOTE — Progress Notes (Signed)
PROGRESS NOTE  Tiffany Stewart  EXB:[id number]  DOB: [birth year]  DOA: 04/05/2018 PCP: Chevis Pretty, FNP   Brief Admission Hx: 47 y.o. female, with a history of chronic pain syndrome, perforated gastric ulcer status post gastrorrhaphy on 03/17/2018 came to hospital with chief complaints of nausea vomiting and abdominal pain.    MDM/Assessment & Plan:   1. Pneumoperitoneum-CT scan of the abdomen showed moderate pneumoperitoneum, patient had surgery on 03/17/2018 but it was not felt to be postoperative air.  New CT findings are concerning for recurrent or new perforation.  General surgery was consulted, Dr. Arnoldo Morale recommended n.p.o., IV fluids, IV Zosyn and will see patient today.  Continue supportive care.   Lactic acid 0.8 2. Intractable vomiting-we will start patient on Zofran as needed for intractable nausea vomiting.  Normal saline at 100 mL/h 3. Chronic pain syndrome-continuing IV Dilaudid 1 mg every 4 hours PRN for pain.  4. Hypokalemia-potassium was 2.8 on admission, now up to 3.4.  Give additional IV KCl 10 mEq x 2.  Follow BMP.  Mg 1.9.  5. QTC prolongation-patient has QTC prolonged 506 likely from hypokalemia.  Also check serum magnesium.  Monitor on telemetry.  DVT Prophylaxis-   Lovenox  Family Communication: Patient Code Status: Full code  Subjective: Pt complains of abdominal pain and nausea this morning.    Objective: Vitals:   04/05/18 2031 04/05/18 2230 04/05/18 2351 04/06/18 0603  BP: (!) 127/101 (!) 120/95 (!) 126/96 108/80  Pulse: 94 87 84 87  Resp: (!) 29 16 20 20   Temp:   98.4 F (36.9 C) 98.4 F (36.9 C)  TempSrc:   Oral Oral  SpO2: 98% 100% 99% 94%  Weight:  54.1 kg    Height:        Intake/Output Summary (Last 24 hours) at 04/06/2018 1027 Last data filed at 04/06/2018 0320 Gross per 24 hour  Intake 628.34 ml  Output -  Net 628.34 ml   Filed Weights   04/05/18 1546 04/05/18 2230  Weight: 60.3 kg 54.1 kg   REVIEW OF SYSTEMS  As per  history otherwise all reviewed and reported negative  Exam:  General exam: awake, alert, cooperative. NAD.  Respiratory system: BBS CTA.  No increased work of breathing. Cardiovascular system: S1 & S2 heard, RRR. No JVD, murmurs, gallops, clicks or pedal edema. Gastrointestinal system: Abdomen is distended and tender to light palpation, generalized guarding. Hypoactive bowel sounds heard. Central nervous system: Alert and oriented. No focal neurological deficits. Extremities: no CCE.  Data Reviewed: Basic Metabolic Panel: Recent Labs  Lab 04/05/18 1601 04/05/18 1942 04/06/18 0706  NA 145  --  140  K 2.8*  --  3.4*  CL 98  --  105  CO2 32  --  26  GLUCOSE 112*  --  84  BUN 21*  --  16  CREATININE 0.66  --  0.58  CALCIUM 9.2  --  8.2*  MG 2.2 2.3 1.9   Liver Function Tests: Recent Labs  Lab 04/05/18 1601 04/06/18 0706  AST 18 17  ALT 20 15  ALKPHOS 148* 114  BILITOT 0.6 0.4  PROT 9.1* 6.9  ALBUMIN 3.6 2.8*   Recent Labs  Lab 04/05/18 1601  LIPASE 36   No results for input(s): AMMONIA in the last 168 hours. CBC: Recent Labs  Lab 04/05/18 1601 04/06/18 0706  WBC 14.0* 12.7*  NEUTROABS 11.1*  --   HGB 13.1 11.2*  HCT 39.1 34.6*  MCV 99.7 100.6*  PLT 818*  646*   Cardiac Enzymes: Recent Labs  Lab 04/05/18 1601  TROPONINI <0.03   CBG (last 3)  No results for input(s): GLUCAP in the last 72 hours. No results found for this or any previous visit (from the past 240 hour(s)).   Studies: Dg Chest 2 View  Result Date: 04/05/2018 CLINICAL DATA:  Abdominal pain. Nausea and vomiting. Recent gastric perforation. EXAM: CHEST - 2 VIEW COMPARISON:  Under GI study 03/25/2018. Two-view abdomen 03/22/2018. CT of the abdomen 03/17/2018. FINDINGS: The heart size is normal. Right greater than left basilar airspace disease likely reflects atelectasis. No edema or effusion is present. Pneumoperitoneum is again seen under the right hemidiaphragm. Rightward curvature is  present in the thoracic spine. IMPRESSION: 1. Pneumoperitoneum. 2. Asymmetric right lower lobe airspace disease. While this likely reflects atelectasis, infection is not excluded. These results were called by telephone at the time of interpretation on 04/05/2018 at 5:01 pm to Dr. Francine Graven , who verbally acknowledged these results. Electronically Signed   By: San Morelle M.D.   On: 04/05/2018 17:03   Ct Abdomen Pelvis W Contrast  Result Date: 04/05/2018 CLINICAL DATA:  Vomiting diarrhea and nausea EXAM: CT ABDOMEN AND PELVIS WITH CONTRAST TECHNIQUE: Multidetector CT imaging of the abdomen and pelvis was performed using the standard protocol following bolus administration of intravenous contrast. CONTRAST:  40mL ISOVUE-300 IOPAMIDOL (ISOVUE-300) INJECTION 61%, 169mL ISOVUE-300 IOPAMIDOL (ISOVUE-300) INJECTION 61% COMPARISON:  03/25/2018 fluoroscopic images, CT 03/17/2018, 05/01/2017 chest x-ray 04/05/2018 FINDINGS: Lower chest: Lung bases demonstrate atelectasis at the right base. The heart size is within normal limits. Hepatobiliary: No focal liver abnormality is seen. Status post cholecystectomy. No biliary dilatation. Pancreas: Unremarkable. No pancreatic ductal dilatation or surrounding inflammatory changes. Spleen: Normal in size without focal abnormality. Adrenals/Urinary Tract: Adrenal glands are unremarkable. Kidneys are normal, without renal calculi, focal lesion, or hydronephrosis. Bladder is unremarkable. Stomach/Bowel: Small fluid at the pre-pyloric region of the stomach, series 2, image number 34 without surrounding gas. Fluid collection along the posterior aspect of the distal stomach now measures 27 x 14 mm, as compared with 6.9 cm previously. Residual edema and soft tissue stranding in the upper abdominal mesentery. No dilated small bowel. No colon wall thickening. Vascular/Lymphatic: Nonaneurysmal aorta. Few upper abdominal mesenteric lymph nodes. Reproductive: Uterus unremarkable.  21 mm rounded fluid collection right posterior pelvis may reflect a small adnexal cyst. Other: Small moderate volume of free air within the upper abdomen. Infiltration of the omentum and anterior mesentery. 2 cm gas and fluid collection within the mid abdominal mesentery slightly to the left of midline consistent with a small abscess. Musculoskeletal: No acute or significant osseous findings. IMPRESSION: 1. Moderate pneumoperitoneum. Given that patient's surgery was 03/17/2018, this is not felt related to postoperative air. Findings are concerning for either recurrent or new perforation. Given upper abdominal predominance of air, favor proximal source, possibly stomach. There is some fluid at the pre-pyloric region of the stomach but without appreciable gas collection in the region. 2. Resolving rim enhancing fluid collection along the posterior aspect of the stomach, now measuring 2.7 cm maximum diameter compared with 6.9 cm previously. 3. New small fluid and gas containing collection measuring 2 cm within the mid abdominal mesentery to the left of midline concerning for a small abscess. Moderate edema and inflammatory change within the anterior abdominal mesentery. Critical Value/emergent results were called by telephone at the time of interpretation on 04/05/2018 at 8:36 pm to Dr. Francine Graven , who verbally acknowledged these results. Electronically Signed  By: Donavan Foil M.D.   On: 04/05/2018 20:37   Scheduled Meds: . enoxaparin (LOVENOX) injection  40 mg Subcutaneous Q24H   Continuous Infusions: . sodium chloride 100 mL/hr at 04/06/18 0025  . famotidine (PEPCID) IV    . piperacillin-tazobactam (ZOSYN)  IV    . potassium chloride     Active Problems:   Intractable vomiting  Time spent:   Irwin Brakeman, MD, FAAFP [facility] Pager [phone]  If 7PM-7AM, please contact night-coverage [email or web address] Password [email or web address] 04/06/2018, 9:06 AM    LOS: 1 day

## 2018-04-07 LAB — CBC WITH DIFFERENTIAL/PLATELET
BASOS ABS: 0 10*3/uL (ref 0.0–0.1)
Basophils Relative: 0 %
Eosinophils Absolute: 0.4 10*3/uL (ref 0.0–0.7)
Eosinophils Relative: 3 %
HCT: 34.4 % — ABNORMAL LOW (ref 36.0–46.0)
HEMOGLOBIN: 10.7 g/dL — AB (ref 12.0–15.0)
LYMPHS PCT: 14 %
Lymphs Abs: 1.9 10*3/uL (ref 0.7–4.0)
MCH: 31.6 pg (ref 26.0–34.0)
MCHC: 31.1 g/dL (ref 30.0–36.0)
MCV: 101.5 fL — AB (ref 78.0–100.0)
Monocytes Absolute: 0.6 10*3/uL (ref 0.1–1.0)
Monocytes Relative: 4 %
NEUTROS PCT: 79 %
Neutro Abs: 10.8 10*3/uL — ABNORMAL HIGH (ref 1.7–7.7)
Platelets: 585 10*3/uL — ABNORMAL HIGH (ref 150–400)
RBC: 3.39 MIL/uL — AB (ref 3.87–5.11)
RDW: 13.2 % (ref 11.5–15.5)
WBC: 13.7 10*3/uL — AB (ref 4.0–10.5)

## 2018-04-07 LAB — GLUCOSE, CAPILLARY
GLUCOSE-CAPILLARY: 44 mg/dL — AB (ref 70–99)
GLUCOSE-CAPILLARY: 53 mg/dL — AB (ref 70–99)
Glucose-Capillary: 147 mg/dL — ABNORMAL HIGH (ref 70–99)
Glucose-Capillary: 87 mg/dL (ref 70–99)

## 2018-04-07 LAB — COMPREHENSIVE METABOLIC PANEL
ALK PHOS: 107 U/L (ref 38–126)
ALT: 16 U/L (ref 0–44)
AST: 20 U/L (ref 15–41)
Albumin: 2.6 g/dL — ABNORMAL LOW (ref 3.5–5.0)
Anion gap: 14 (ref 5–15)
BUN: 14 mg/dL (ref 6–20)
CALCIUM: 8.3 mg/dL — AB (ref 8.9–10.3)
CO2: 22 mmol/L (ref 22–32)
CREATININE: 0.63 mg/dL (ref 0.44–1.00)
Chloride: 105 mmol/L (ref 98–111)
GFR calc non Af Amer: >60 mL/min (ref >60)
Glucose, Bld: 56 mg/dL — ABNORMAL LOW (ref 70–99)
Potassium: 3.4 mmol/L — ABNORMAL LOW (ref 3.5–5.1)
SODIUM: 141 mmol/L (ref 135–145)
Total Bilirubin: 0.9 mg/dL (ref 0.3–1.2)
Total Protein: 6.4 g/dL — ABNORMAL LOW (ref 6.5–8.1)

## 2018-04-07 LAB — HIV ANTIBODY (ROUTINE TESTING W REFLEX): HIV SCREEN 4TH GENERATION: NONREACTIVE

## 2018-04-07 LAB — MAGNESIUM: Magnesium: 1.7 mg/dL (ref 1.7–2.4)

## 2018-04-07 MED ORDER — DIPHENHYDRAMINE HCL 25 MG PO CAPS
25.0000 mg | ORAL_CAPSULE | Freq: Once | ORAL | Status: AC
  Administered 2018-04-07: 25 mg via ORAL
  Filled 2018-04-07: qty 1

## 2018-04-07 MED ORDER — DEXTROSE 50 % IV SOLN
INTRAVENOUS | Status: AC
  Administered 2018-04-07: 50 mL
  Filled 2018-04-07: qty 50

## 2018-04-07 MED ORDER — POTASSIUM CHLORIDE 10 MEQ/100ML IV SOLN
10.0000 meq | INTRAVENOUS | Status: AC
  Administered 2018-04-07 (×2): 10 meq via INTRAVENOUS
  Filled 2018-04-07 (×2): qty 100

## 2018-04-07 MED ORDER — FAT EMULSION PLANT BASED 20 % IV EMUL
250.0000 mL | INTRAVENOUS | Status: AC
  Administered 2018-04-07: 250 mL via INTRAVENOUS
  Filled 2018-04-07: qty 250

## 2018-04-07 MED ORDER — SODIUM CHLORIDE 0.9 % IV SOLN
INTRAVENOUS | Status: DC
  Administered 2018-04-07 – 2018-04-09 (×2): via INTRAVENOUS

## 2018-04-07 MED ORDER — TRACE MINERALS CR-CU-MN-SE-ZN 10-1000-500-60 MCG/ML IV SOLN
INTRAVENOUS | Status: AC
  Administered 2018-04-07: 18:00:00 via INTRAVENOUS
  Filled 2018-04-07: qty 960

## 2018-04-07 MED ORDER — INSULIN ASPART 100 UNIT/ML ~~LOC~~ SOLN: 0.0000 [IU] | Freq: Three times a day (TID) | SUBCUTANEOUS | Status: DC

## 2018-04-07 NOTE — Progress Notes (Addendum)
[facility] CONSULT NOTE   Pharmacy Consult for TPN Indication: Pneumoperitoneum  Patient Measurements: Height: 5\' 1"  (154.9 cm) Weight: 133 lb 2.5 oz (60.4 kg) IBW/kg (Calculated) : 47.8 TPN AdjBW (KG): 51 Body mass index is 25.16 kg/m.   Assessment: Pharmacy consulted to dose TPN in a patient with recent gastric perforation surgery who continue to have abdominal pain. There appears to be a delay in the ceiling of the perforation and patient has a recurrent leak.  GI: famotidine 20 mg BID --> will replace in TPN Endo: non-diabetic patient with stable glucose levels Insulin requirements in the past 24 hours: N/A Lytes: K: 3.4    Renal:  Scr: 0.39 Pulm: Cards:  Hepatobil: Labs normal Neuro: ID: Zosyn 3.375g IV every 8 hours f  TPN Access: Central line  TPN start date: 04/07/18 Nutritional Goals (per RD recommendation on 8/5): KCal: 1500-1680 Protein: 75-85g Fluid: >1400 mL daily  Goal TPN rate is 65 ml/hr (provides 1587 kcal/day)  Current Nutrition: Patient admitted to hospital 8/23 and has been NPO since admission.  Plan:  Initiate TPN at 40 mL/hr. This TPN provides 48 g of protein, 144 g of dextrose, and 48 g of lipids which provides 1162 kCals per day, meeting ~80% of patient needs Electrolytes in TPN: Clinimix 5/15 with added electrolytes Add MVI, trace elements, and famotidine 40 mg to TPN Adjust IVMF NS for IVF rate 166mls/hr once TPN is started Monitor TPN labs KCL 61meq/100mls boluses x 4   Isac Sarna, BS [place], [place] Clinical Pharmacist Pager [phone]

## 2018-04-07 NOTE — Progress Notes (Signed)
Patient's blood sugar 44 at 1200. Gave amp of D50. Notified doctor Arnoldo Morale. Will continue to monitor.

## 2018-04-07 NOTE — Progress Notes (Signed)
CRITICAL VALUE ALERT  Critical Value:  CBG 44  Date & Time Notied:  04/07/2018 @ 1200  Provider Notified: Arnoldo Morale  Orders Received/Actions taken:

## 2018-04-07 NOTE — Progress Notes (Signed)
Subjective: Patient states that she has less abdominal pain.  She is hungry.  Objective: Vital signs in last 24 hours: Temp:  [98.4 F (36.9 C)-99.2 F (37.3 C)] 98.4 F (36.9 C) (08/25 0630) Pulse Rate:  [89-95] 95 (08/25 0630) Resp:  [16-20] 18 (08/25 0630) BP: (114-121)/(83-87) 120/85 (08/25 0630) SpO2:  [96 %-98 %] 96 % (08/25 0630) Last BM Date: (PTA)  Intake/Output from previous day: 08/24 0701 - 08/25 0700 In: 2952.1 [P.O.:240; I.V.:2316.7; IV Piggyback:395.4] Out: -  Intake/Output this shift: No intake/output data recorded.  General appearance: alert, cooperative and no distress Resp: clear to auscultation bilaterally Cardio: regular rate and rhythm, S1, S2 normal, no murmur, click, rub or gallop GI: soft, non-tender; bowel sounds normal; no masses,  no organomegaly  Lab Results:  Recent Labs    04/06/18 0706 04/07/18 0701  WBC 12.7* 13.7*  HGB 11.2* 10.7*  HCT 34.6* 34.4*  PLT 646* 585*   BMET Recent Labs    04/06/18 0706 04/07/18 0701  NA 140 141  K 3.4* 3.4*  CL 105 105  CO2 26 22  GLUCOSE 84 56*  BUN 16 14  CREATININE 0.58 0.63  CALCIUM 8.2* 8.3*   PT/INR No results for input(s): LABPROT, INR in the last 72 hours.  Studies/Results: Dg Chest 2 View  Result Date: 04/05/2018 CLINICAL DATA:  Abdominal pain. Nausea and vomiting. Recent gastric perforation. EXAM: CHEST - 2 VIEW COMPARISON:  Under GI study 03/25/2018. Two-view abdomen 03/22/2018. CT of the abdomen 03/17/2018. FINDINGS: The heart size is normal. Right greater than left basilar airspace disease likely reflects atelectasis. No edema or effusion is present. Pneumoperitoneum is again seen under the right hemidiaphragm. Rightward curvature is present in the thoracic spine. IMPRESSION: 1. Pneumoperitoneum. 2. Asymmetric right lower lobe airspace disease. While this likely reflects atelectasis, infection is not excluded. These results were called by telephone at the time of interpretation on  04/05/2018 at 5:01 pm to Dr. Francine Graven , who verbally acknowledged these results. Electronically Signed   By: San Morelle M.D.   On: 04/05/2018 17:03   Ct Abdomen Pelvis W Contrast  Result Date: 04/05/2018 CLINICAL DATA:  Vomiting diarrhea and nausea EXAM: CT ABDOMEN AND PELVIS WITH CONTRAST TECHNIQUE: Multidetector CT imaging of the abdomen and pelvis was performed using the standard protocol following bolus administration of intravenous contrast. CONTRAST:  74mL ISOVUE-300 IOPAMIDOL (ISOVUE-300) INJECTION 61%, 169mL ISOVUE-300 IOPAMIDOL (ISOVUE-300) INJECTION 61% COMPARISON:  03/25/2018 fluoroscopic images, CT 03/17/2018, 05/01/2017 chest x-ray 04/05/2018 FINDINGS: Lower chest: Lung bases demonstrate atelectasis at the right base. The heart size is within normal limits. Hepatobiliary: No focal liver abnormality is seen. Status post cholecystectomy. No biliary dilatation. Pancreas: Unremarkable. No pancreatic ductal dilatation or surrounding inflammatory changes. Spleen: Normal in size without focal abnormality. Adrenals/Urinary Tract: Adrenal glands are unremarkable. Kidneys are normal, without renal calculi, focal lesion, or hydronephrosis. Bladder is unremarkable. Stomach/Bowel: Small fluid at the pre-pyloric region of the stomach, series 2, image number 34 without surrounding gas. Fluid collection along the posterior aspect of the distal stomach now measures 27 x 14 mm, as compared with 6.9 cm previously. Residual edema and soft tissue stranding in the upper abdominal mesentery. No dilated small bowel. No colon wall thickening. Vascular/Lymphatic: Nonaneurysmal aorta. Few upper abdominal mesenteric lymph nodes. Reproductive: Uterus unremarkable. 21 mm rounded fluid collection right posterior pelvis may reflect a small adnexal cyst. Other: Small moderate volume of free air within the upper abdomen. Infiltration of the omentum and anterior mesentery. 2 cm gas  and fluid collection within the mid  abdominal mesentery slightly to the left of midline consistent with a small abscess. Musculoskeletal: No acute or significant osseous findings. IMPRESSION: 1. Moderate pneumoperitoneum. Given that patient's surgery was 03/17/2018, this is not felt related to postoperative air. Findings are concerning for either recurrent or new perforation. Given upper abdominal predominance of air, favor proximal source, possibly stomach. There is some fluid at the pre-pyloric region of the stomach but without appreciable gas collection in the region. 2. Resolving rim enhancing fluid collection along the posterior aspect of the stomach, now measuring 2.7 cm maximum diameter compared with 6.9 cm previously. 3. New small fluid and gas containing collection measuring 2 cm within the mid abdominal mesentery to the left of midline concerning for a small abscess. Moderate edema and inflammatory change within the anterior abdominal mesentery. Critical Value/emergent results were called by telephone at the time of interpretation on 04/05/2018 at 8:36 pm to Dr. Francine Graven , who verbally acknowledged these results. Electronically Signed   By: Donavan Foil M.D.   On: 04/05/2018 20:37    Anti-infectives: Anti-infectives (From admission, onward)   Start     Dose/Rate Route Frequency Ordered Stop   04/06/18 1000  piperacillin-tazobactam (ZOSYN) IVPB 3.375 g     3.375 g 12.5 mL/hr over 240 Minutes Intravenous Every 8 hours 04/06/18 0832     04/06/18 0400  piperacillin-tazobactam (ZOSYN) IVPB 3.375 g     3.375 g 12.5 mL/hr over 240 Minutes Intravenous  Once 04/05/18 2355 04/06/18 0820   04/05/18 2045  piperacillin-tazobactam (ZOSYN) IVPB 3.375 g     3.375 g 100 mL/hr over 30 Minutes Intravenous  Once 04/05/18 2031 04/05/18 2131      Assessment/Plan: Impression: Recurrent leak from previous [place] plication.  Patient does not have peritonitis.  PICC line has been placed.  We will continue bowel rest and IV antibiotics.  TPN  will be started.  LOS: 2 days    Aviva Signs 04/07/2018

## 2018-04-08 LAB — GLUCOSE, CAPILLARY
GLUCOSE-CAPILLARY: 113 mg/dL — AB (ref 70–99)
GLUCOSE-CAPILLARY: 120 mg/dL — AB (ref 70–99)
GLUCOSE-CAPILLARY: 127 mg/dL — AB (ref 70–99)
GLUCOSE-CAPILLARY: 98 mg/dL (ref 70–99)
Glucose-Capillary: 105 mg/dL — ABNORMAL HIGH (ref 70–99)
Glucose-Capillary: 114 mg/dL — ABNORMAL HIGH (ref 70–99)
Glucose-Capillary: 121 mg/dL — ABNORMAL HIGH (ref 70–99)

## 2018-04-08 LAB — CBC WITH DIFFERENTIAL/PLATELET
BASOS ABS: 0 10*3/uL (ref 0.0–0.1)
BASOS PCT: 0 %
Eosinophils Absolute: 0.7 10*3/uL (ref 0.0–0.7)
Eosinophils Relative: 5 %
HCT: 34.1 % — ABNORMAL LOW (ref 36.0–46.0)
HEMOGLOBIN: 10.9 g/dL — AB (ref 12.0–15.0)
LYMPHS PCT: 13 %
Lymphs Abs: 1.7 10*3/uL (ref 0.7–4.0)
MCH: 31.4 pg (ref 26.0–34.0)
MCHC: 32 g/dL (ref 30.0–36.0)
MCV: 98.3 fL (ref 78.0–100.0)
Monocytes Absolute: 0.7 10*3/uL (ref 0.1–1.0)
Monocytes Relative: 6 %
NEUTROS ABS: 10.1 10*3/uL — AB (ref 1.7–7.7)
NEUTROS PCT: 76 %
Platelets: 518 10*3/uL — ABNORMAL HIGH (ref 150–400)
RBC: 3.47 MIL/uL — ABNORMAL LOW (ref 3.87–5.11)
RDW: 12.2 % (ref 11.5–15.5)
WBC: 13.3 10*3/uL — ABNORMAL HIGH (ref 4.0–10.5)

## 2018-04-08 LAB — COMPREHENSIVE METABOLIC PANEL
ALBUMIN: 2.6 g/dL — AB (ref 3.5–5.0)
ALK PHOS: 102 U/L (ref 38–126)
ALT: 15 U/L (ref 0–44)
AST: 18 U/L (ref 15–41)
Anion gap: 9 (ref 5–15)
BILIRUBIN TOTAL: 0.4 mg/dL (ref 0.3–1.2)
BUN: 8 mg/dL (ref 6–20)
CALCIUM: 8.4 mg/dL — AB (ref 8.9–10.3)
CO2: 26 mmol/L (ref 22–32)
CREATININE: 0.49 mg/dL (ref 0.44–1.00)
Chloride: 102 mmol/L (ref 98–111)
GFR calc Af Amer: >60 mL/min (ref >60)
GLUCOSE: 108 mg/dL — AB (ref 70–99)
POTASSIUM: 3.5 mmol/L (ref 3.5–5.1)
Sodium: 137 mmol/L (ref 135–145)
TOTAL PROTEIN: 6.9 g/dL (ref 6.5–8.1)

## 2018-04-08 LAB — TRIGLYCERIDES: Triglycerides: 92 mg/dL (ref <150)

## 2018-04-08 LAB — PREALBUMIN: Prealbumin: 12.8 mg/dL — ABNORMAL LOW (ref 18–38)

## 2018-04-08 LAB — PHOSPHORUS: Phosphorus: 3.2 mg/dL (ref 2.5–4.6)

## 2018-04-08 LAB — MAGNESIUM: Magnesium: 1.7 mg/dL (ref 1.7–2.4)

## 2018-04-08 MED ORDER — FAT EMULSION PLANT BASED 20 % IV EMUL
250.0000 mL | INTRAVENOUS | Status: AC
  Administered 2018-04-08: 250 mL via INTRAVENOUS
  Filled 2018-04-08: qty 250

## 2018-04-08 MED ORDER — POTASSIUM CHLORIDE 10 MEQ/100ML IV SOLN
10.0000 meq | INTRAVENOUS | Status: AC
  Administered 2018-04-08 (×2): 10 meq via INTRAVENOUS
  Filled 2018-04-08 (×2): qty 100

## 2018-04-08 MED ORDER — ALUM & MAG HYDROXIDE-SIMETH 200-200-20 MG/5ML PO SUSP
15.0000 mL | Freq: Four times a day (QID) | ORAL | Status: DC | PRN
  Administered 2018-04-08 – 2018-04-10 (×5): 15 mL via ORAL
  Filled 2018-04-08 (×5): qty 30

## 2018-04-08 MED ORDER — TRACE MINERALS CR-CU-MN-SE-ZN 10-1000-500-60 MCG/ML IV SOLN: INTRAVENOUS | Status: DC

## 2018-04-08 MED ORDER — TRACE MINERALS CR-CU-MN-SE-ZN 10-1000-500-60 MCG/ML IV SOLN
INTRAVENOUS | Status: AC
  Administered 2018-04-08: 18:00:00 via INTRAVENOUS
  Filled 2018-04-08: qty 1200

## 2018-04-08 NOTE — Progress Notes (Signed)
  Subjective: Patient denies any abdominal pain.  Objective: Vital signs in last 24 hours: Temp:  [98.6 F (37 C)-98.9 F (37.2 C)] 98.6 F (37 C) (08/26 0420) Pulse Rate:  [79-87] 83 (08/26 0420) Resp:  [16-18] 16 (08/26 0420) BP: (122-148)/(86-100) 144/94 (08/26 0420) SpO2:  [94 %-97 %] 94 % (08/26 0420) Last BM Date: 04/06/18  Intake/Output from previous day: 08/25 0701 - 08/26 0700 In: 1680.5 [I.V.:1069; IV Piggyback:611.5] Out: 1200 [Urine:1200] Intake/Output this shift: No intake/output data recorded.  General appearance: alert, cooperative and no distress Resp: clear to auscultation bilaterally Cardio: regular rate and rhythm, S1, S2 normal, no murmur, click, rub or gallop GI: soft, non-tender; bowel sounds normal; no masses,  no organomegaly and Incision healing well  Lab Results:  Recent Labs    04/07/18 0701 04/08/18 0711  WBC 13.7* 13.3*  HGB 10.7* 10.9*  HCT 34.4* 34.1*  PLT 585* 518*   BMET Recent Labs    04/07/18 0701 04/08/18 0711  NA 141 137  K 3.4* 3.5  CL 105 102  CO2 22 26  GLUCOSE 56* 108*  BUN 14 8  CREATININE 0.63 0.49  CALCIUM 8.3* 8.4*   PT/INR No results for input(s): LABPROT, INR in the last 72 hours.  Studies/Results: No results found.  Anti-infectives: Anti-infectives (From admission, onward)   Start     Dose/Rate Route Frequency Ordered Stop   04/06/18 1000  piperacillin-tazobactam (ZOSYN) IVPB 3.375 g     3.375 g 12.5 mL/hr over 240 Minutes Intravenous Every 8 hours 04/06/18 0832     04/06/18 0400  piperacillin-tazobactam (ZOSYN) IVPB 3.375 g     3.375 g 12.5 mL/hr over 240 Minutes Intravenous  Once 04/05/18 2355 04/06/18 0820   04/05/18 2045  piperacillin-tazobactam (ZOSYN) IVPB 3.375 g     3.375 g 100 mL/hr over 30 Minutes Intravenous  Once 04/05/18 2031 04/05/18 2131      Assessment/Plan: Impression: Recurrence of pneumoperitoneum, but no peritoneal signs present.  We will continue bowel rest and TPN.   Anticipate doing upper GI series in 2 days.  May stop cardiac monitoring.  LOS: 3 days    Aviva Signs 04/08/2018

## 2018-04-08 NOTE — Progress Notes (Signed)
Initial Nutrition Assessment  DOCUMENTATION CODES:      INTERVENTION:  TPN per pharmacy   NUTRITION DIAGNOSIS:  Inadequate oral intake related to inability to eat as evidenced by NPO status(-required for bowel rest).  GOAL:   Patient will meet greater than or equal to 90% of their needs  MONITOR:   Diet advancement, Labs, Weight trends  REASON FOR ASSESSMENT:  New TPN consult     ASSESSMENT: Recent hx on 8/5 admission Complained of abdominal pain on admission. CT findings perforated viscus. She is s/p exploratory laparotomy, gastrorrhaphy on 8/4.   8/26 Patient has hx of  spastic paraplegia. She has returned with abdominal pain (acute onset). Patient reports she has been following a soft diet since discharged on 8/14. She has been eating 2 meals daily - a late breakfast and early dinner with snacks as needed in between. Foods consumed primarily mashed potatoes, pudding, jello etc. Patient does not drink oral nutrition supplements.  Patient started on TPN -8/24_ see pharmacy note for details. She is NPO for bowel rest due to recurrent leak s/p peptic ulcer disease repair. CT scan to be repeated and partial gastrectomy potentially based on findings of CT.   Patient reports UBW-100 lb - she is currently 118.8 lb. No lower extremity edema noted.    Labs: BMP Latest Ref Rng & Units 04/08/2018 04/07/2018 04/06/2018  Glucose 70 - 99 mg/dL 108(H) 56(L) 84  BUN 6 - 20 mg/dL 8 14 16   Creatinine 0.44 - 1.00 mg/dL 0.49 0.63 0.58  BUN/Creat Ratio 9 - 23 - - -  Sodium 135 - 145 mmol/L 137 141 140  Potassium 3.5 - 5.1 mmol/L 3.5 3.4(L) 3.4(L)  Chloride 98 - 111 mmol/L 102 105 105  CO2 22 - 32 mmol/L 26 22 26   Calcium 8.9 - 10.3 mg/dL 8.4(L) 8.3(L) 8.2(L)    Medications reviewed and include: pepcid, zosyn   NUTRITION - FOCUSED PHYSICAL EXAM: Moderate muscle loss temporal region and moderate orbital, buccal fat loss and mild lower extremity edema.  Diet Order:   Diet Order            Diet NPO time specified Except for: Ice Chips  Diet effective now              EDUCATION NEEDS:  No education needs have been identified at this time  Skin:  Skin Assessment: Reviewed RN Assessment  Last BM:  8/24   Height:   Ht Readings from Last 1 Encounters:  04/05/18 5\' 1"  (1.549 m)    Weight:   Wt Readings from Last 1 Encounters:  04/05/18 54.1 kg    Ideal Body Weight:  48 kg48 kg  BMI:  Body mass index is 22.54 kg/m.  Estimated Nutritional Needs:   Kcal:   1440-1632 (30-34 kcal/kg IBW)  Protein:   72-82 gr    (1.5-1.7 gr/kg/ABW)  Fluid:   1.5-1.7 liters daily  Colman Cater MS,RD,CSG,LDN Office: [phone] Pager: [phone]

## 2018-04-08 NOTE — Progress Notes (Addendum)
[facility] CONSULT NOTE   Pharmacy Consult for TPN Indication: Pneumoperitoneum  Patient Measurements: Height: 5\' 1"  (154.9 cm) Weight: 133 lb 2.5 oz (60.4 kg) IBW/kg (Calculated) : 47.8 TPN AdjBW (KG): 51 Body mass index is 25.16 kg/m.   Assessment: Pharmacy consulted to dose TPN in a patient with recent gastric perforation surgery who continue to have abdominal pain. There appears to be a delay in the ceiling of the perforation and patient has a recurrent leak.  GI: famotidine 20 mg BID -->  in TPN Endo: non-diabetic patient with stable glucose levels Insulin requirements in the past 24 hours: N/A Lytes: K: 3.5   Renal:  Scr: 0.49 Pulm: Cards:  Hepatobil: wnl, alb 2.6, pre-alb 12.8 Neuro: ID: Zosyn 3.375g IV every 8 hours   TPN Access: Central line  TPN start date: 04/07/18 Nutritional Goals (per RD recommendation on 8/26): KCal: 8786-7672 Protein: 72-82g Fluid: 1.5-1.7L daily  Goal TPN rate is 65 ml/hr (provides 1587 kcal/day)  Current Nutrition: Patient admitted to hospital 8/23 and has been NPO since admission.  Plan:  Increase  TPN rate to  50 mL/hr (~75% of goal TPN rate) This TPN provides 60g of protein, 180 g of dextrose, and 50 g of lipids which provides 1332 kCals per day, meeting ~90% of patient needs Electrolytes in TPN: Clinimix 5/15 with added electrolytes Add MVI, trace elements, and famotidine 40 mg to TPN Adjust IVMF NS for IVF rate 135mls/hr once TPN is started Monitor TPN labs KCL 2meq/100ml bolus x2 doses for K+ 3.5  Despina Pole, Pharm. D. Clinical Pharmacist 04/08/2018 12:44 PM

## 2018-04-08 NOTE — Care Management Important Message (Signed)
Important Message  Patient Details  Name: AIMAR SHREWSBURY MRN: [id number] Date of Birth: 1971-03-07   Medicare Important Message Given:  Yes    Shelda Altes 04/08/2018, 11:38 AM

## 2018-04-09 ENCOUNTER — Ambulatory Visit: Payer: Self-pay | Admitting: General Surgery

## 2018-04-09 LAB — CBC WITH DIFFERENTIAL/PLATELET
Basophils Absolute: 0 10*3/uL (ref 0.0–0.1)
Basophils Relative: 0 %
Eosinophils Absolute: 0.5 10*3/uL (ref 0.0–0.7)
Eosinophils Relative: 5 %
HEMATOCRIT: 32.7 % — AB (ref 36.0–46.0)
HEMOGLOBIN: 10.6 g/dL — AB (ref 12.0–15.0)
LYMPHS ABS: 1.6 10*3/uL (ref 0.7–4.0)
Lymphocytes Relative: 15 %
MCH: 31.9 pg (ref 26.0–34.0)
MCHC: 32.4 g/dL (ref 30.0–36.0)
MCV: 98.5 fL (ref 78.0–100.0)
Monocytes Absolute: 0.6 10*3/uL (ref 0.1–1.0)
Monocytes Relative: 6 %
NEUTROS ABS: 7.7 10*3/uL (ref 1.7–7.7)
Neutrophils Relative %: 74 %
Platelets: 437 10*3/uL — ABNORMAL HIGH (ref 150–400)
RBC: 3.32 MIL/uL — AB (ref 3.87–5.11)
RDW: 12.9 % (ref 11.5–15.5)
WBC: 10.4 10*3/uL (ref 4.0–10.5)

## 2018-04-09 LAB — COMPREHENSIVE METABOLIC PANEL
ALBUMIN: 2.5 g/dL — AB (ref 3.5–5.0)
ALK PHOS: 106 U/L (ref 38–126)
ALT: 15 U/L (ref 0–44)
AST: 19 U/L (ref 15–41)
Anion gap: 9 (ref 5–15)
BILIRUBIN TOTAL: 0.2 mg/dL — AB (ref 0.3–1.2)
BUN: 10 mg/dL (ref 6–20)
CALCIUM: 8.3 mg/dL — AB (ref 8.9–10.3)
CO2: 25 mmol/L (ref 22–32)
CREATININE: 0.49 mg/dL (ref 0.44–1.00)
Chloride: 104 mmol/L (ref 98–111)
GFR calc non Af Amer: >60 mL/min (ref >60)
GLUCOSE: 121 mg/dL — AB (ref 70–99)
Potassium: 3.5 mmol/L (ref 3.5–5.1)
Sodium: 138 mmol/L (ref 135–145)
TOTAL PROTEIN: 6.7 g/dL (ref 6.5–8.1)

## 2018-04-09 LAB — GLUCOSE, CAPILLARY
GLUCOSE-CAPILLARY: 105 mg/dL — AB (ref 70–99)
GLUCOSE-CAPILLARY: 108 mg/dL — AB (ref 70–99)
Glucose-Capillary: 102 mg/dL — ABNORMAL HIGH (ref 70–99)
Glucose-Capillary: 103 mg/dL — ABNORMAL HIGH (ref 70–99)
Glucose-Capillary: 104 mg/dL — ABNORMAL HIGH (ref 70–99)
Glucose-Capillary: 90 mg/dL (ref 70–99)

## 2018-04-09 MED ORDER — FAT EMULSION PLANT BASED 20 % IV EMUL
250.0000 mL | INTRAVENOUS | Status: AC
  Administered 2018-04-09: 250 mL via INTRAVENOUS
  Filled 2018-04-09: qty 250

## 2018-04-09 MED ORDER — TRACE MINERALS CR-CU-MN-SE-ZN 10-1000-500-60 MCG/ML IV SOLN
INTRAVENOUS | Status: AC
  Administered 2018-04-09: 19:00:00 via INTRAVENOUS
  Filled 2018-04-09: qty 1560

## 2018-04-09 MED ORDER — POTASSIUM CHLORIDE 10 MEQ/100ML IV SOLN
10.0000 meq | INTRAVENOUS | Status: AC
  Administered 2018-04-09 (×2): 10 meq via INTRAVENOUS
  Filled 2018-04-09 (×2): qty 100

## 2018-04-09 NOTE — Progress Notes (Signed)
  Subjective: Heartburn improved.  Objective: Vital signs in last 24 hours: Temp:  [98 F (36.7 C)-98.3 F (36.8 C)] 98.2 F (36.8 C) (08/27 0512) Pulse Rate:  [85-89] 89 (08/27 0512) Resp:  [16] 16 (08/26 1629) BP: (110-114)/(82-87) 114/82 (08/27 0512) SpO2:  [96 %-97 %] 97 % (08/27 0512) Last BM Date: 04/06/18  Intake/Output from previous day: 08/26 0701 - 08/27 0700 In: 3028.7 [I.V.:2671; IV Piggyback:357.7] Out: -  Intake/Output this shift: No intake/output data recorded.  General appearance: alert, cooperative and no distress Resp: clear to auscultation bilaterally Cardio: regular rate and rhythm, S1, S2 normal, no murmur, click, rub or gallop GI: soft, non-tender; bowel sounds normal; no masses,  no organomegaly  Lab Results:  Recent Labs    04/08/18 0711 04/09/18 0527  WBC 13.3* 10.4  HGB 10.9* 10.6*  HCT 34.1* 32.7*  PLT 518* 437*   BMET Recent Labs    04/08/18 0711 04/09/18 0527  NA 137 138  K 3.5 3.5  CL 102 104  CO2 26 25  GLUCOSE 108* 121*  BUN 8 10  CREATININE 0.49 0.49  CALCIUM 8.4* 8.3*   PT/INR No results for input(s): LABPROT, INR in the last 72 hours.  Studies/Results: No results found.  Anti-infectives: Anti-infectives (From admission, onward)   Start     Dose/Rate Route Frequency Ordered Stop   04/06/18 1000  piperacillin-tazobactam (ZOSYN) IVPB 3.375 g     3.375 g 12.5 mL/hr over 240 Minutes Intravenous Every 8 hours 04/06/18 0832     04/06/18 0400  piperacillin-tazobactam (ZOSYN) IVPB 3.375 g     3.375 g 12.5 mL/hr over 240 Minutes Intravenous  Once 04/05/18 2355 04/06/18 0820   04/05/18 2045  piperacillin-tazobactam (ZOSYN) IVPB 3.375 g     3.375 g 100 mL/hr over 30 Minutes Intravenous  Once 04/05/18 2031 04/05/18 2131      Assessment/Plan: Impression: Recurrent pneumoperitoneum, patient's abdominal exam benign.  Has been on TPN and bowel rest. Plan: We will do upper GI series tomorrow to assess previous gastric ulcer  repair.  Further management pending those results.  LOS: 4 days    Aviva Signs 04/09/2018

## 2018-04-09 NOTE — Progress Notes (Signed)
[facility] CONSULT NOTE   Pharmacy Consult for TPN Indication: Pneumoperitoneum  Patient Measurements: Height: 5\' 1"  (154.9 cm) Weight: 133 lb 2.5 oz (60.4 kg) IBW/kg (Calculated) : 47.8 TPN AdjBW (KG): 51 Body mass index is 25.16 kg/m.   Assessment: Pharmacy consulted to dose TPN in a patient with recent gastric perforation surgery who continue to have abdominal pain. Abdominal exam was negative. Upper GI series due tomorrow evaluate former gastric ulcer repair.    GI: famotidine 20 mg BID -->  in TPN Endo: non-diabetic patient with stable glucose levels Insulin requirements in the past 24 hours: N/A Lytes: K: 3.5   Renal:  Scr: 0.49 Pulm: Cards:  Hepatobil: wnl, alb 2.6, pre-alb 12.8 Neuro: ID: Zosyn 3.375g IV every 8 hours   TPN Access: Central line  TPN start date: 04/07/18 Nutritional Goals (per RD recommendation on 8/26): KCal: 8127-5170 Protein: 72-82g Fluid: 1.5-1.7L daily  Goal TPN rate is 65 ml/hr (provides 1587 kcal/day)  Current Nutrition: Patient admitted to hospital 8/23 and has been NPO since admission.  Plan:  Increase  TPN rate to  65 mL/hr  (at goal rate) This TPN provides 78g of protein, 234 g of dextrose, and 53.3 g of lipids which provides 1587 kCals per day, meeting ~100% of patient needs Electrolytes in TPN: Clinimix E 5/15  Add MVI, trace elements, and famotidine 40 mg, folic acid and thiamine to TPN Adjust IVMF NS for IVF rate 152mls/hr once TPN is started Monitor TPN labs Repeat KCl 17meq/100ml bolus x2 doses for K+ 3.5  Despina Pole, Pharm. D. Clinical Pharmacist 04/09/2018 1:05 PM

## 2018-04-09 NOTE — Progress Notes (Signed)
Pharmacy Antibiotic Note  Today is day #6 of Zosyn therapy for this 43 yof with gastric perforation and intra-abdominal infection. No cultures have been ordered. Patient is currently afebrile and her WBC count is WNL. Tomorrow,  patient is to have upper GI series to evaluate previous gastric ulcer repair  Plan: Continue Zosyn 3.375g IV q8h (4-hour infusion).  F/u cxs and clinical progress Monitor V/S, labs   Height: 5\' 1"  (154.9 cm) Weight: 119 lb 4.3 oz (54.1 kg) IBW/kg (Calculated) : 47.8  Temp (24hrs), Avg:98.3 F (36.8 C), Min:98 F (36.7 C), Max:98.5 F (36.9 C)  Recent Labs  Lab 04/05/18 1601 04/05/18 1658 04/05/18 1942 04/05/18 2137 04/06/18 0706 04/07/18 0701 04/08/18 0711 04/09/18 0527  WBC 14.0*  --   --   --  12.7* 13.7* 13.3* 10.4  CREATININE 0.66  --   --   --  0.58 0.63 0.49 0.49  LATICACIDVEN  --  0.8 0.9 0.8  --   --   --   --     Estimated Creatinine Clearance: 67 mL/min (by C-G formula based on SCr of 0.49 mg/dL).    Allergies  Allergen Reactions  . Hctz [Hydrochlorothiazide] Diarrhea  . Morphine Other (See Comments)    Flushing, turns skin red   . Zoloft [Sertraline Hcl] Other (See Comments)    Spaced out    Antimicrobials this admission: Zosyn 8/23 >>   Dose adjustments this admission: N/A  Microbiology results: No cxs  Thank you for allowing pharmacy to be a part of this patient's care.  Despina Pole, Pharm. D. Clinical Pharmacist 04/09/2018 1:47 PM

## 2018-04-10 ENCOUNTER — Inpatient Hospital Stay (HOSPITAL_COMMUNITY): Payer: Medicare Other

## 2018-04-10 LAB — GLUCOSE, CAPILLARY
Glucose-Capillary: 96 mg/dL (ref 70–99)
Glucose-Capillary: 110 mg/dL — ABNORMAL HIGH (ref 70–99)
Glucose-Capillary: 103 mg/dL — ABNORMAL HIGH (ref 70–99)
Glucose-Capillary: 99 mg/dL (ref 70–99)
Glucose-Capillary: 89 mg/dL (ref 70–99)

## 2018-04-10 LAB — COMPREHENSIVE METABOLIC PANEL
ALBUMIN: 2.7 g/dL — AB (ref 3.5–5.0)
ALT: 23 U/L (ref 0–44)
AST: 34 U/L (ref 15–41)
Alkaline Phosphatase: 147 U/L — ABNORMAL HIGH (ref 38–126)
Anion gap: 9 (ref 5–15)
BILIRUBIN TOTAL: 0.4 mg/dL (ref 0.3–1.2)
BUN: 11 mg/dL (ref 6–20)
CO2: 25 mmol/L (ref 22–32)
Calcium: 8.5 mg/dL — ABNORMAL LOW (ref 8.9–10.3)
Chloride: 102 mmol/L (ref 98–111)
Creatinine, Ser: 0.49 mg/dL (ref 0.44–1.00)
GFR calc Af Amer: >60 mL/min (ref >60)
GFR calc non Af Amer: >60 mL/min (ref >60)
GLUCOSE: 97 mg/dL (ref 70–99)
POTASSIUM: 4.4 mmol/L (ref 3.5–5.1)
Sodium: 136 mmol/L (ref 135–145)
TOTAL PROTEIN: 6.9 g/dL (ref 6.5–8.1)

## 2018-04-10 LAB — CBC WITH DIFFERENTIAL/PLATELET
BASOS ABS: 0 10*3/uL (ref 0.0–0.1)
BASOS PCT: 0 %
Eosinophils Absolute: 0.7 10*3/uL (ref 0.0–0.7)
Eosinophils Relative: 8 %
HCT: 35.3 % — ABNORMAL LOW (ref 36.0–46.0)
HEMOGLOBIN: 11.3 g/dL — AB (ref 12.0–15.0)
Lymphocytes Relative: 19 %
Lymphs Abs: 1.8 10*3/uL (ref 0.7–4.0)
MCH: 31.8 pg (ref 26.0–34.0)
MCHC: 32 g/dL (ref 30.0–36.0)
MCV: 99.4 fL (ref 78.0–100.0)
MONO ABS: 0.6 10*3/uL (ref 0.1–1.0)
Monocytes Relative: 7 %
NEUTROS ABS: 6.1 10*3/uL (ref 1.7–7.7)
NEUTROS PCT: 66 %
Platelets: 458 10*3/uL — ABNORMAL HIGH (ref 150–400)
RBC: 3.55 MIL/uL — ABNORMAL LOW (ref 3.87–5.11)
RDW: 13 % (ref 11.5–15.5)
WBC: 9.1 10*3/uL (ref 4.0–10.5)

## 2018-04-10 MED ORDER — TIZANIDINE HCL 4 MG PO TABS
4.0000 mg | ORAL_TABLET | Freq: Three times a day (TID) | ORAL | Status: DC | PRN
  Administered 2018-04-10 – 2018-04-11 (×3): 4 mg via ORAL
  Filled 2018-04-10 (×3): qty 1

## 2018-04-10 MED ORDER — TRACE MINERALS CR-CU-MN-SE-ZN 10-1000-500-60 MCG/ML IV SOLN
INTRAVENOUS | Status: DC
  Administered 2018-04-10: 18:00:00 via INTRAVENOUS
  Filled 2018-04-10: qty 720

## 2018-04-10 MED ORDER — SODIUM CHLORIDE 0.9 % IV SOLN: INTRAVENOUS | Status: DC

## 2018-04-10 MED ORDER — FAT EMULSION PLANT BASED 20 % IV EMUL
250.0000 mL | INTRAVENOUS | Status: AC
  Administered 2018-04-10: 250 mL via INTRAVENOUS
  Filled 2018-04-10: qty 250

## 2018-04-10 MED ORDER — IOPAMIDOL (ISOVUE-300) INJECTION 61%
INTRAVENOUS | Status: AC
  Administered 2018-04-10: 150 mL via ORAL
  Filled 2018-04-10: qty 300

## 2018-04-10 MED ORDER — PANTOPRAZOLE SODIUM 40 MG PO TBEC
40.0000 mg | DELAYED_RELEASE_TABLET | Freq: Two times a day (BID) | ORAL | Status: DC
  Administered 2018-04-10 – 2018-04-11 (×3): 40 mg via ORAL
  Filled 2018-04-10 (×3): qty 1

## 2018-04-10 MED ORDER — ALPRAZOLAM 0.5 MG PO TABS
0.5000 mg | ORAL_TABLET | Freq: Two times a day (BID) | ORAL | Status: DC | PRN
  Administered 2018-04-10 – 2018-04-11 (×2): 0.5 mg via ORAL
  Filled 2018-04-10 (×2): qty 1

## 2018-04-10 NOTE — Progress Notes (Signed)
Patient had a small bowel movement that was liquid.

## 2018-04-10 NOTE — Progress Notes (Signed)
  Subjective: Patient without significant complaints.  Objective: Vital signs in last 24 hours: Temp:  [97.4 F (36.3 C)-98.5 F (36.9 C)] 98.5 F (36.9 C) (08/28 0621) Pulse Rate:  [81-87] 81 (08/28 0621) Resp:  [17-20] 17 (08/28 0621) BP: (116-142)/(87-102) 116/87 (08/28 0621) SpO2:  [93 %-97 %] 97 % (08/28 0621) Last BM Date: 04/09/18  Intake/Output from previous day: 08/27 0701 - 08/28 0700 In: 1121 [I.V.:836.2; IV Piggyback:284.8] Out: 1600 [Urine:1600] Intake/Output this shift: Total I/O In: 1708.3 [I.V.:1708.3] Out: -   General appearance: alert, cooperative and no distress Resp: clear to auscultation bilaterally Cardio: regular rate and rhythm, S1, S2 normal, no murmur, click, rub or gallop GI: soft, non-tender; bowel sounds normal; no masses,  no organomegaly  Lab Results:  Recent Labs    04/09/18 0527 04/10/18 0458  WBC 10.4 9.1  HGB 10.6* 11.3*  HCT 32.7* 35.3*  PLT 437* 458*   BMET Recent Labs    04/09/18 0527 04/10/18 0458  NA 138 136  K 3.5 4.4  CL 104 102  CO2 25 25  GLUCOSE 121* 97  BUN 10 11  CREATININE 0.49 0.49  CALCIUM 8.3* 8.5*   PT/INR No results for input(s): LABPROT, INR in the last 72 hours.  Studies/Results: Dg Ugi  W/kub  Result Date: 04/10/2018 CLINICAL DATA:  Recently repaired gastric ulcer. Pneumoperitoneum concerning for recurrent perforation. EXAM: UPPER GI SERIES WITHOUT KUB TECHNIQUE: Routine upper GI series was performed with isotopic water-soluble contrast barium. FLUOROSCOPY TIME:  Fluoroscopy Time:  2 minutes 30 seconds Radiation Exposure Index (if provided by the fluoroscopic device): 21 mGy Number of Acquired Spot Images: For COMPARISON:  Abdominal CT from 5 days ago FINDINGS: Scout image shows a nonobstructive bowel gas pattern. Lung volumes are low. Most of the thoracic esophagus was visualized and is negative for stricture. No stasis or aspiration seen. The stomach was distended to physiologic levels, as much as  tolerated by patient distaste of contrast. With rolling the contrast bolus was advanced into the distal stomach which is distorted with fold thickening along the inferior wall, where there is postoperative change by CT. No leak was seen. There is no gastric outlet obstruction. Contrast opacifies nondilated small bowel loops which have an expected orientation. IMPRESSION: 1. Negative for leak. 2. Edematous stomach at site of repair, expected. No gastric outlet obstruction. Electronically Signed   By: Monte Fantasia M.D.   On: 04/10/2018 10:07    Anti-infectives: Anti-infectives (From admission, onward)   Start     Dose/Rate Route Frequency Ordered Stop   04/06/18 1000  piperacillin-tazobactam (ZOSYN) IVPB 3.375 g     3.375 g 12.5 mL/hr over 240 Minutes Intravenous Every 8 hours 04/06/18 0832     04/06/18 0400  piperacillin-tazobactam (ZOSYN) IVPB 3.375 g     3.375 g 12.5 mL/hr over 240 Minutes Intravenous  Once 04/05/18 2355 04/06/18 0820   04/05/18 2045  piperacillin-tazobactam (ZOSYN) IVPB 3.375 g     3.375 g 100 mL/hr over 30 Minutes Intravenous  Once 04/05/18 2031 04/05/18 2131      Assessment/Plan: Impression: Pneumoperitoneum, resolved.  No evidence of leak by upper GI series today. Plan: We will advance to soft diet.  Pharmacy has been contacted about weaning TPN off.  LOS: 5 days    Aviva Signs 04/10/2018

## 2018-04-10 NOTE — Progress Notes (Signed)
PHARMACY - ADULT TOTAL PARENTERAL NUTRITION CONSULT NOTE   Pharmacy Consult for TPN Indication: Pneumoperitoneum  Patient Measurements: Height: 5\' 1"  (154.9 cm) Weight: 133 lb 2.5 oz (60.4 kg) IBW/kg (Calculated) : 47.8 TPN AdjBW (KG): 51 Body mass index is 25.16 kg/m.   Assessment: Pharmacy consulted to dose TPN in a patient with recent gastric perforation surgery who continue to have abdominal pain. Abdominal exam was negative. Upper GI series due tomorrow evaluate former gastric ulcer repair.    GI: famotidine 20 mg BID -->  in TPN Endo: non-diabetic patient with stable glucose levels Insulin requirements in the past 24 hours: N/A Lytes: K: 3.5   Renal:  Scr: 0.49 Pulm: Cards:  Hepatobil: wnl, alb 2.6, pre-alb 12.8 Neuro: ID:   TPN Access: Central line  TPN start date: 04/07/18 Nutritional Goals (per RD recommendation on 8/26): KCal: 7903-8333 Protein: 72-82g Fluid: 1.5-1.7L daily  Goal TPN rate is 65 ml/hr (provides 1587 kcal/day)  Current Nutrition: Patient admitted to hospital 8/23 and has been NPO since admission.  Plan:  Plan to discontinue TPN on 8/29 so will wean and cut rate in half for today Decrease TPN rate to 30 mL/hr  This TPN provides 35g of protein, 107 g of dextrose, and 26.6 g of lipids which provides 793 kCals per day Electrolytes in TPN: Clinimix E 5/15  Add MVI, trace elements, and famotidine 40 mg, folic acid and thiamine to TPN Adjust IVMF NS for IVF rate 182mls/hr once TPN is started Monitor TPN labs   Margot Ables, PharmD Clinical Pharmacist 04/10/2018 11:44 AM

## 2018-04-11 ENCOUNTER — Telehealth: Payer: Self-pay | Admitting: Nurse Practitioner

## 2018-04-11 LAB — GLUCOSE, CAPILLARY
Glucose-Capillary: 98 mg/dL (ref 70–99)
Glucose-Capillary: 103 mg/dL — ABNORMAL HIGH (ref 70–99)

## 2018-04-11 MED ORDER — OXYCODONE-ACETAMINOPHEN 7.5-325 MG PO TABS: 1.0000 | ORAL_TABLET | Freq: Four times a day (QID) | ORAL | 0 refills | Status: DC | PRN

## 2018-04-11 NOTE — Discharge Summary (Signed)
Physician Discharge Summary  Patient ID: Tiffany Stewart MRN: [id number] DOB/AGE: Mar 28, 1971 47 y.o.  Admit date: 04/05/2018 Discharge date: 04/11/2018  Admission Diagnoses: Abdominal pain, pneumoperitoneum, status post gastrorrhaphy  Discharge Diagnoses: Same Active Problems:   TOBACCO ABUSE   GERD   Iron deficiency anemia   Gastric perforation (HCC)   Protein-calorie malnutrition, severe   Intractable vomiting   Discharged Condition: good  Hospital Course: Patient is a 47 year old white female status post gastrorrhaphy for perforated peptic ulcer who presented to [facility] with an acute onset of upper abdominal pain.  CT scan of the abdomen revealed pneumoperitoneum which was more pronounced than what was expected given her recent surgery.  She was admitted to the hospital on 04/05/2018.  Bowel rest and TPN were started.  She was also started on IV antibiotics.  Her abdominal pain improved.  She subsequently underwent an upper GI series on 04/10/2018 and there was no evidence of a leak.  Her diet was then advanced without difficulty.  She is being discharged home on 04/11/2018 in good and improving condition.  Discharge Exam: Blood pressure 110/78, pulse 62, temperature 98.5 F (36.9 C), temperature source Oral, resp. rate 16, height 5\' 1"  (1.549 m), weight 54.1 kg, SpO2 97 %. General appearance: alert, cooperative and no distress Resp: clear to auscultation bilaterally Cardio: regular rate and rhythm, S1, S2 normal, no murmur, click, rub or gallop GI: soft, non-tender; bowel sounds normal; no masses,  no organomegaly  Disposition: Discharge disposition: 01-Home or Self Care       Discharge Instructions    Diet - low sodium heart healthy   Complete by:  As directed    Increase activity slowly   Complete by:  As directed      Allergies as of 04/11/2018      Reactions   Hctz [hydrochlorothiazide] Diarrhea   Morphine Other (See Comments)   Flushing, turns skin red    Zoloft [sertraline Hcl] Other (See Comments)   Spaced out      Medication List    TAKE these medications   acetaminophen 500 MG tablet Commonly known as:  TYLENOL Take 1,500 mg by mouth 2 (two) times daily as needed for headache.   ALPRAZolam 0.5 MG tablet Commonly known as:  XANAX TAKE 1 TABLET BY MOUTH TWICE DAILY AS NEEDED FOR ANXIETY. What changed:  additional instructions   escitalopram 10 MG tablet Commonly known as:  LEXAPRO Take 1 tablet (10 mg total) by mouth daily.   oxyCODONE-acetaminophen 7.5-325 MG tablet Commonly known as:  PERCOCET Take 1 tablet by mouth every 6 (six) hours as needed for severe pain.   pantoprazole 40 MG tablet Commonly known as:  PROTONIX Take 1 tablet (40 mg total) by mouth 2 (two) times daily before a meal.   tiZANidine 4 MG tablet Commonly known as:  ZANAFLEX TAKE 1 TABLET EVERY 6 HOURS AS NEEDED FOR MUSCLE SPASMS What changed:    how much to take  how to take this  when to take this  reasons to take this  additional instructions      Follow-up Information    Aviva Signs, MD Follow up.   Specialty:  General Surgery Why:  as needed Contact information: [id number] [place] [address] [phone]           Signed: Aviva Signs 04/11/2018, 8:08 AM

## 2018-04-11 NOTE — Telephone Encounter (Signed)
[place]  PT is getting discharged from hospital today and was told to call us to get MMM to update her BP medicine. She doesn't know the name of it and  states that we have the name of her BP medicine on file.

## 2018-04-11 NOTE — Care Management Important Message (Signed)
Important Message  Patient Details  Name: Tiffany Stewart MRN: [id number] Date of Birth: 06-14-1971   Medicare Important Message Given:  Yes    Sherald Barge, RN 04/11/2018, 8:33 AM

## 2018-04-11 NOTE — Progress Notes (Signed)
Pt discharged home today per Dr. Arnoldo Morale. Pt's PICC site D/C'd and WDL. Pt's VSS. Pt provided with home medication list, discharge instructions and prescriptions. Verbalized understanding. Pt left floor via WC in stable condition accompanied by NT. [facility] transportation set up for transport home.

## 2018-04-17 ENCOUNTER — Telehealth: Payer: Self-pay | Admitting: *Deleted

## 2018-04-17 NOTE — Telephone Encounter (Signed)
Tiffany Stewart called to let us know she has been I the hospital and the MD made some changes to her meds.

## 2018-04-22 ENCOUNTER — Other Ambulatory Visit: Payer: Self-pay | Admitting: Nurse Practitioner

## 2018-04-22 DIAGNOSIS — F411 Generalized anxiety disorder: Secondary | ICD-10-CM

## 2018-04-23 NOTE — Telephone Encounter (Signed)
Last seen 01/28/18  MMM

## 2018-04-23 NOTE — Telephone Encounter (Signed)
Patient called again about her medications following recent surgery. It was really hard to understand and not clear what she is asking for.  She speaks of baclofen, tizanidine and hydrocodone.  Contacted patient and asked her to identify what she is needing the provider to do for her

## 2018-04-26 ENCOUNTER — Telehealth: Payer: Self-pay

## 2018-04-26 NOTE — Telephone Encounter (Signed)
Pt called and I could barely understand her message. What I could get out of the message is the pharmacy is waiting on our office to contact them. I called the pharmacy which is [facility] and spoke with the pharmacist for clarification. Pharmacist stated that she is requesting Percocet. Dr. Terrial Rhodes in [place] wrote her a prescription Percocet 7.5/325 mg filled on 03/27/18 and 04/11/18 #25. She requested a refill again and Dr. Aviva Signs declined on 04/23/18. So she requested the pharmacy contact Dr. Letta Pate for a refill.

## 2018-04-26 NOTE — Telephone Encounter (Signed)
Do not see indication for Percocet.  I am treating her for spasticity using anti-spasticity medications and Botox injections

## 2018-04-29 NOTE — Telephone Encounter (Signed)
Spoke with patient and informed that Dr. Letta Pate is not treating pain with narcotics. Dr. Letta Pate is treating spasticity with muscle relaxers and possibly Botox. I encouraged patient to contact PCP if the need for pain medications continues

## 2018-05-02 ENCOUNTER — Ambulatory Visit: Payer: Medicare Other | Admitting: Nurse Practitioner

## 2018-05-03 ENCOUNTER — Ambulatory Visit: Payer: Medicare Other | Admitting: Nurse Practitioner

## 2018-05-16 ENCOUNTER — Other Ambulatory Visit: Payer: Self-pay | Admitting: Nurse Practitioner

## 2018-05-16 DIAGNOSIS — G114 Hereditary spastic paraplegia: Secondary | ICD-10-CM

## 2018-05-16 DIAGNOSIS — F411 Generalized anxiety disorder: Secondary | ICD-10-CM

## 2018-05-21 ENCOUNTER — Encounter: Payer: Self-pay | Admitting: Nurse Practitioner

## 2018-05-21 ENCOUNTER — Ambulatory Visit (INDEPENDENT_AMBULATORY_CARE_PROVIDER_SITE_OTHER): Payer: Medicare Other | Admitting: Nurse Practitioner

## 2018-05-21 VITALS — BP 138/104 | HR 82 | Temp 97.6°F

## 2018-05-21 DIAGNOSIS — D509 Iron deficiency anemia, unspecified: Secondary | ICD-10-CM

## 2018-05-21 DIAGNOSIS — F17200 Nicotine dependence, unspecified, uncomplicated: Secondary | ICD-10-CM

## 2018-05-21 DIAGNOSIS — K589 Irritable bowel syndrome without diarrhea: Secondary | ICD-10-CM | POA: Diagnosis not present

## 2018-05-21 DIAGNOSIS — K2100 Gastro-esophageal reflux disease with esophagitis, without bleeding: Secondary | ICD-10-CM

## 2018-05-21 DIAGNOSIS — K275 Chronic or unspecified peptic ulcer, site unspecified, with perforation: Secondary | ICD-10-CM

## 2018-05-21 DIAGNOSIS — G114 Hereditary spastic paraplegia: Secondary | ICD-10-CM

## 2018-05-21 DIAGNOSIS — E43 Unspecified severe protein-calorie malnutrition: Secondary | ICD-10-CM

## 2018-05-21 DIAGNOSIS — F411 Generalized anxiety disorder: Secondary | ICD-10-CM

## 2018-05-21 MED ORDER — METHOCARBAMOL 500 MG PO TABS: 500.0000 mg | ORAL_TABLET | Freq: Four times a day (QID) | ORAL | 5 refills | Status: DC

## 2018-05-21 MED ORDER — ALPRAZOLAM 0.5 MG PO TABS: ORAL_TABLET | ORAL | 2 refills | Status: DC

## 2018-05-21 MED ORDER — TIZANIDINE HCL 4 MG PO TABS: ORAL_TABLET | ORAL | 2 refills | Status: DC

## 2018-05-21 MED ORDER — ESCITALOPRAM OXALATE 10 MG PO TABS: 10.0000 mg | ORAL_TABLET | Freq: Every day | ORAL | 1 refills | Status: DC

## 2018-05-21 MED ORDER — PANTOPRAZOLE SODIUM 40 MG PO TBEC: 40.0000 mg | DELAYED_RELEASE_TABLET | Freq: Two times a day (BID) | ORAL | 1 refills | Status: DC

## 2018-05-21 NOTE — Progress Notes (Signed)
 Subjective:    Patient ID: Tiffany Stewart, female    DOB: 09/19/1970, 47 y.o.   MRN: [id number]   Chief Complaint: Medical Management of Chronic Issues (She is having neck pain)   HPI:  1. Irritable bowel syndrome, unspecified type  Doing well no diarrhea or constipation.  2. Gastroesophageal reflux disease with esophagitis  Taking protonix daily- working well. She had hiatal hernia repair and that hs really helped her symptoms.  3. Perforated peptic ulcer (HCC)  better sinee had haital hernia repair.  4. HSP (hereditary spastic paraplegia) (HCC)  She has constant pain in lower ext. The surgeon stopped her baclofen because he thought that was messing with her stomach. He put her on oxycodone 7.5/325 bid.   5. Iron deficiency anemia, unspecified iron deficiency anemia type This is due to blood loss . We will look at lab stoday   6. Anxiety state  She is on xanax BID and gets very anxious if does not take.  7. TOBACCO ABUSE  Smoke almost 2 packs a day  8. Protein-calorie malnutrition, severe  Has a very poor appetite.    Outpatient Encounter Medications as of 05/21/2018  Medication Sig  . acetaminophen (TYLENOL) 500 MG tablet Take 1,500 mg by mouth 2 (two) times daily as needed for headache.   . ALPRAZolam (XANAX) 0.5 MG tablet TAKE 1 TABLET 2 TIMES A DAY AS NEEDED FOR ANXIETY.  . escitalopram (LEXAPRO) 10 MG tablet Take 1 tablet (10 mg total) by mouth daily.  . oxyCODONE-acetaminophen (PERCOCET) 7.5-325 MG tablet Take 1 tablet by mouth every 6 (six) hours as needed for severe pain.  . pantoprazole (PROTONIX) 40 MG tablet Take 1 tablet (40 mg total) by mouth 2 (two) times daily before a meal.  . tiZANidine (ZANAFLEX) 4 MG tablet TAKE 1 TABLET EVERY 6 HOURS AS NEEDED FOR MUSCLE SPASMS (Patient taking differently: Take 4 mg by mouth every 6 (six) hours as needed for muscle spasms. )        New complaints: Constant leg cramps from muscle spasms  Social history: Lives with  family- she needs help with ADL's   Review of Systems  Constitutional: Negative for activity change and appetite change.  HENT: Negative.   Eyes: Negative for pain.  Respiratory: Negative for shortness of breath.   Cardiovascular: Negative for chest pain, palpitations and leg swelling.  Gastrointestinal: Negative for abdominal pain.  Endocrine: Negative for polydipsia.  Genitourinary: Negative.   Skin: Negative for rash.  Neurological: Negative for dizziness, weakness and headaches.  Hematological: Does not bruise/bleed easily.  Psychiatric/Behavioral: Negative.   All other systems reviewed and are negative.      Objective:   Physical Exam  Constitutional: She is oriented to person, place, and time. She appears well-developed and well-nourished. No distress.  HENT:  Head: Normocephalic.  Nose: Nose normal.  Mouth/Throat: Oropharynx is clear and moist.  Eyes: Pupils are equal, round, and reactive to light. EOM are normal.  Neck: Normal range of motion. Neck supple. No JVD present. Carotid bruit is not present.  Cardiovascular: Normal rate, regular rhythm, normal heart sounds and intact distal pulses.  Pulmonary/Chest: Effort normal and breath sounds normal. No respiratory distress. She has no wheezes. She has no rales. She exhibits no tenderness.  Abdominal: Soft. Normal appearance, normal aorta and bowel sounds are normal. She exhibits no distension, no abdominal bruit, no pulsatile midline mass and no mass. There is no splenomegaly or hepatomegaly. There is no tenderness.  Musculoskeletal: Normal range   of motion. She exhibits no edema.  In wheel chair   Lymphadenopathy:    She has no cervical adenopathy.  Neurological: She is alert and oriented to person, place, and time. She displays abnormal reflex (over active dtr's lower ext.).  Skin: Skin is warm and dry.  Psychiatric: She has a normal mood and affect. Her behavior is normal. Judgment and thought content normal.  Nursing  note and vitals reviewed.  BP (!) 138/104   Pulse 82   Temp 97.6 F (36.4 C) (Oral)       Assessment & Plan:  Tiffany Stewart comes in today with chief complaint of Medical Management of Chronic Issues (She is having neck pain)   Diagnosis and orders addressed:  1. Irritable bowel syndrome, unspecified type Need  To watch diet Protein supplement daily  2. Gastroesophageal reflux disease with esophagitis Avoid spicy foods Do not eat 2 hours prior to bedtime - pantoprazole (PROTONIX) 40 MG tablet; Take 1 tablet (40 mg total) by mouth 2 (two) times daily before a meal.  Dispense: 60 tablet; Refill: 1  3. Perforated peptic ulcer (HCC)  4. HSP (hereditary spastic paraplegia) (HCC) Added robaxin Patient wanted lortab but told would not do that right now.- is on xana x and really do not want to start that - methocarbamol (ROBAXIN) 500 MG tablet; Take 1 tablet (500 mg total) by mouth 4 (four) times daily.  Dispense: 60 tablet; Refill: 5 - tiZANidine (ZANAFLEX) 4 MG tablet; TAKE 1 TABLET EVERY 6 HOURS AS NEEDED FOR MUSCLE SPASMS  Dispense: 120 tablet; Refill: 2  5. Iron deficiency anemia, unspecified iron deficiency anemia type Labs pending - CBC with Differential/Platelet  6. Anxiety state Stress management - ALPRAZolam (XANAX) 0.5 MG tablet; TAKE 1 TABLET 2 TIMES A DAY AS NEEDED FOR ANXIETY.  Dispense: 60 tablet; Refill: 2 - escitalopram (LEXAPRO) 10 MG tablet; Take 1 tablet (10 mg total) by mouth daily.  Dispense: 90 tablet; Refill: 1  7. TOBACCO ABUSE Smoking cessation enmcourgaed  8. Protein-calorie malnutrition, severe Daily protein shake or possibly 2 - CMP14+EGFR   Labs pending Health Maintenance reviewed Diet and exercise encouraged  Follow up plan: 3 months   Mary-Margaret , FNP  

## 2018-05-21 NOTE — Patient Instructions (Signed)
Spasticity Spasticity is a condition in which your muscles contract suddenly and unpredictably (spasm). Spasticity usually affects your arms, legs, or back. It can also affect your speech and the way you walk. Spasticity can range from mild muscle stiffness and tightness to severe, uncontrollable muscle spasms. Severe spasticity can be painful and can freeze your muscles in an uncomfortable position. Follow these instructions at home: Watch your condition for any changes. The following actions may help to lessen any discomfort you are feeling:  Stay active.  Maintain good posture when walking and sitting.  Work with a physical therapist.  Do stretching and range of motion exercises at home as told by a physical therapist.  Work with an occupational therapist. This type of health care provider can help you function better at home and at work.  Wear a brace as told by your health care provider to prevent muscle contractions.  Have the affected muscles massaged.  If directed, apply ice to the affected muscle area: ? Put ice in a plastic bag. ? Place a towel between your skin and the bag or between your brace and the bag. ? Leave the ice on for 20 minutes, 2?3 times a day.  Contact a health care provider if:   Your muscle spasms get worse.  You develop other symptoms along with spasticity.  You have a fever or chills.  You have trouble passing urine or you experience a burning feeling when you pass urine.  You become constipated.  You need more support at home. Get help right away if:  You have trouble breathing.  You have a muscle spasm that freezes you into a painful position.  You cannot walk.  You cannot care for yourself at home. This information is not intended to replace advice given to you by your health care provider. Make sure you discuss any questions you have with your health care provider. Document Released: 07/21/2002 Document Revised: 03/27/2016 Document  Reviewed: 10/14/2013 Elsevier Interactive Patient Education  2018 [employer].

## 2018-05-22 LAB — CBC WITH DIFFERENTIAL/PLATELET
BASOS: 0 %
Basophils Absolute: 0 10*3/uL (ref 0.0–0.2)
EOS (ABSOLUTE): 0 10*3/uL (ref 0.0–0.4)
Eos: 0 %
HEMATOCRIT: 39.8 % (ref 34.0–46.6)
Hemoglobin: 13.3 g/dL (ref 11.1–15.9)
IMMATURE GRANULOCYTES: 0 %
Immature Grans (Abs): 0 10*3/uL (ref 0.0–0.1)
LYMPHS ABS: 2.5 10*3/uL (ref 0.7–3.1)
Lymphs: 30 %
MCH: 31.3 pg (ref 26.6–33.0)
MCHC: 33.4 g/dL (ref 31.5–35.7)
MCV: 94 fL (ref 79–97)
MONOS ABS: 0.3 10*3/uL (ref 0.1–0.9)
Monocytes: 4 %
NEUTROS ABS: 5.5 10*3/uL (ref 1.4–7.0)
Neutrophils: 66 %
Platelets: 452 10*3/uL — ABNORMAL HIGH (ref 150–450)
RBC: 4.25 x10E6/uL (ref 3.77–5.28)
RDW: 13.2 % (ref 12.3–15.4)
WBC: 8.3 10*3/uL (ref 3.4–10.8)

## 2018-05-22 LAB — CMP14+EGFR
A/G RATIO: 1.6 (ref 1.2–2.2)
ALBUMIN: 4.6 g/dL (ref 3.5–5.5)
ALT: 9 IU/L (ref 0–32)
AST: 14 IU/L (ref 0–40)
Alkaline Phosphatase: 90 IU/L (ref 39–117)
BUN / CREAT RATIO: 17 (ref 9–23)
BUN: 10 mg/dL (ref 6–24)
Bilirubin Total: 0.3 mg/dL (ref 0.0–1.2)
CALCIUM: 9.5 mg/dL (ref 8.7–10.2)
CO2: 23 mmol/L (ref 20–29)
Chloride: 99 mmol/L (ref 96–106)
Creatinine, Ser: 0.58 mg/dL (ref 0.57–1.00)
GFR, EST AFRICAN AMERICAN: 129 mL/min/{1.73_m2} (ref >59)
GFR, EST NON AFRICAN AMERICAN: 112 mL/min/{1.73_m2} (ref >59)
GLOBULIN, TOTAL: 2.9 g/dL (ref 1.5–4.5)
Glucose: 86 mg/dL (ref 65–99)
Potassium: 3.8 mmol/L (ref 3.5–5.2)
SODIUM: 140 mmol/L (ref 134–144)
Total Protein: 7.5 g/dL (ref 6.0–8.5)

## 2018-05-22 NOTE — Telephone Encounter (Signed)
Patient seen in office on 05/21/18

## 2018-05-28 ENCOUNTER — Telehealth: Payer: Self-pay | Admitting: Nurse Practitioner

## 2018-05-28 NOTE — Telephone Encounter (Signed)
Patient aware.

## 2018-05-28 NOTE — Telephone Encounter (Signed)
PT has Zanaflex and Robaxin on med list. These are the same mediation. Did she get the Zanaflex?

## 2018-05-28 NOTE — Addendum Note (Signed)
Addended by: Evelina Dun A on: 05/28/2018 03:26 PM   Modules accepted: Orders

## 2018-05-28 NOTE — Telephone Encounter (Signed)
Tiffany Stewart, sorry to bother you on vacation, but do you mind taking a look please.

## 2018-05-28 NOTE — Telephone Encounter (Signed)
There Is no othermuscle relaxer she can take

## 2018-05-28 NOTE — Telephone Encounter (Signed)
Patient is was previously  taking both baclofen and zanaflex. Medicaid will not cover the robaxin but baclofen was "messing with her stomach"  Patient would like to know what she can take when the zanaflex is not working or if the zanaflex can be increased

## 2018-05-28 NOTE — Telephone Encounter (Signed)
She is on max dose. This will have to wait for PCP.

## 2018-06-17 ENCOUNTER — Other Ambulatory Visit: Payer: Self-pay | Admitting: Nurse Practitioner

## 2018-06-17 DIAGNOSIS — G114 Hereditary spastic paraplegia: Secondary | ICD-10-CM

## 2018-08-05 ENCOUNTER — Encounter: Payer: Medicare Other | Attending: Physical Medicine & Rehabilitation

## 2018-08-05 ENCOUNTER — Ambulatory Visit: Payer: Medicare Other | Admitting: Physical Medicine & Rehabilitation

## 2018-08-19 ENCOUNTER — Other Ambulatory Visit: Payer: Self-pay | Admitting: Nurse Practitioner

## 2018-08-19 DIAGNOSIS — F411 Generalized anxiety disorder: Secondary | ICD-10-CM

## 2018-08-19 DIAGNOSIS — G114 Hereditary spastic paraplegia: Secondary | ICD-10-CM

## 2018-08-23 ENCOUNTER — Ambulatory Visit: Payer: Medicare Other | Admitting: Nurse Practitioner

## 2018-08-29 ENCOUNTER — Encounter: Payer: Self-pay | Admitting: Nurse Practitioner

## 2018-09-16 ENCOUNTER — Other Ambulatory Visit: Payer: Self-pay | Admitting: Nurse Practitioner

## 2018-09-16 ENCOUNTER — Telehealth: Payer: Self-pay | Admitting: Nurse Practitioner

## 2018-09-16 DIAGNOSIS — G114 Hereditary spastic paraplegia: Secondary | ICD-10-CM

## 2018-09-16 DIAGNOSIS — F411 Generalized anxiety disorder: Secondary | ICD-10-CM

## 2018-09-16 NOTE — Telephone Encounter (Signed)
Attempted to contact patient = NA  Has apt 2/6 which is when she should run out of medicine.

## 2018-09-16 NOTE — Telephone Encounter (Signed)
Needs to be seen

## 2018-09-17 ENCOUNTER — Other Ambulatory Visit: Payer: Self-pay | Admitting: Nurse Practitioner

## 2018-09-17 DIAGNOSIS — G114 Hereditary spastic paraplegia: Secondary | ICD-10-CM

## 2018-09-17 NOTE — Telephone Encounter (Signed)
LMOVM when her last RF was and that this will be refilled at her visit on 09/19/18 which is when she should be running out

## 2018-09-18 NOTE — Telephone Encounter (Signed)
Last seen 05/21/18 MMM

## 2018-09-18 NOTE — Telephone Encounter (Signed)
Has appt. tomorrow

## 2018-09-19 ENCOUNTER — Ambulatory Visit: Payer: Medicare Other | Admitting: Nurse Practitioner

## 2018-09-19 ENCOUNTER — Other Ambulatory Visit: Payer: Self-pay | Admitting: Nurse Practitioner

## 2018-09-19 DIAGNOSIS — F411 Generalized anxiety disorder: Secondary | ICD-10-CM

## 2018-09-20 ENCOUNTER — Telehealth: Payer: Self-pay | Admitting: Nurse Practitioner

## 2018-09-20 NOTE — Telephone Encounter (Signed)
Last seen 05/21/2018

## 2018-09-24 ENCOUNTER — Ambulatory Visit (INDEPENDENT_AMBULATORY_CARE_PROVIDER_SITE_OTHER): Payer: Medicare Other | Admitting: Nurse Practitioner

## 2018-09-24 ENCOUNTER — Encounter: Payer: Self-pay | Admitting: Nurse Practitioner

## 2018-09-24 VITALS — BP 132/94 | HR 99 | Temp 97.0°F

## 2018-09-24 DIAGNOSIS — F17200 Nicotine dependence, unspecified, uncomplicated: Secondary | ICD-10-CM

## 2018-09-24 DIAGNOSIS — F411 Generalized anxiety disorder: Secondary | ICD-10-CM | POA: Diagnosis not present

## 2018-09-24 DIAGNOSIS — K589 Irritable bowel syndrome without diarrhea: Secondary | ICD-10-CM

## 2018-09-24 DIAGNOSIS — D509 Iron deficiency anemia, unspecified: Secondary | ICD-10-CM | POA: Diagnosis not present

## 2018-09-24 DIAGNOSIS — K2100 Gastro-esophageal reflux disease with esophagitis, without bleeding: Secondary | ICD-10-CM

## 2018-09-24 DIAGNOSIS — G114 Hereditary spastic paraplegia: Secondary | ICD-10-CM | POA: Diagnosis not present

## 2018-09-24 DIAGNOSIS — K275 Chronic or unspecified peptic ulcer, site unspecified, with perforation: Secondary | ICD-10-CM

## 2018-09-24 MED ORDER — ESCITALOPRAM OXALATE 10 MG PO TABS: 10.0000 mg | ORAL_TABLET | Freq: Every day | ORAL | 1 refills | Status: DC

## 2018-09-24 MED ORDER — PANTOPRAZOLE SODIUM 40 MG PO TBEC: 40.0000 mg | DELAYED_RELEASE_TABLET | Freq: Two times a day (BID) | ORAL | 1 refills | Status: DC

## 2018-09-24 MED ORDER — ALPRAZOLAM 0.5 MG PO TABS: ORAL_TABLET | ORAL | 2 refills | Status: DC

## 2018-09-24 MED ORDER — TIZANIDINE HCL 4 MG PO TABS: 4.0000 mg | ORAL_TABLET | Freq: Four times a day (QID) | ORAL | 2 refills | Status: DC | PRN

## 2018-09-24 NOTE — Progress Notes (Signed)
Subjective:    Patient ID: Tiffany Stewart, female    DOB: 09/16/1970, 48 y.o.   MRN: [id number]   Chief Complaint: medica lmanagement of chronic issues  HPI:  1. Iron deficiency anemia, unspecified iron deficiency anemia type  Last hgb was 13.3. she is suppose to be on daily supplement  2. Anxiety state  Stays anxious . She is on xanax 2x a day and say sthat when she doe snot have it she is very nervous and shaky.  3. TOBACCO ABUSE  She smokes over 1 1/2 packs a day. Says she has no desire to stop smoking. Says that it helps her relax.  4. HSP (hereditary spastic paraplegia) (Tiffany Stewart)  She is in wheel chair. Not able to walk very well. She take tizadine daily which helps some.   5. Irritable bowel syndrome, unspecified type  deneis any recent flare ups. She says if she watches what she eat sthen she has no problems  6. Perforated peptic ulcer (Tiffany Stewart)  This occurred last year and she has had no more issues since she was discharged from hospital  7. Gastroesophageal reflux disease with esophagitis  She has daly reflux and takes protonix. Works well most days    Outpatient Encounter Medications as of 09/24/2018  Medication Sig  . acetaminophen (TYLENOL) 500 MG tablet Take 1,500 mg by mouth 2 (two) times daily as needed for headache.   . ALPRAZolam (XANAX) 0.5 MG tablet TAKE 1 TABLET BY MOUTH TWICE DAILY AS NEEDED FOR ANXIETY.  [name] escitalopram (LEXAPRO) 10 MG tablet Take 1 tablet (10 mg total) by mouth daily.  . pantoprazole (PROTONIX) 40 MG tablet Take 1 tablet (40 mg total) by mouth 2 (two) times daily before a meal.  . tiZANidine (ZANAFLEX) 4 MG tablet TAKE (1) TABLET EVERY SIX HOURS AS NEEDED FOR MUSCLE SPASMS.      New complaints: None today  Social history: Lives with niece and her nieces kids  Review of Systems  Constitutional: Negative.  Negative for activity change and appetite change.  HENT: Negative.   Eyes: Negative for pain.  Respiratory: Negative for shortness of  breath.   Cardiovascular: Negative for chest pain, palpitations and leg swelling.  Gastrointestinal: Negative for abdominal pain.  Endocrine: Negative for polydipsia.  Genitourinary: Negative.   Skin: Negative for rash.  Neurological: Negative for dizziness, weakness and headaches.  Hematological: Does not bruise/bleed easily.  Psychiatric/Behavioral: Negative.   All other systems reviewed and are negative.      Objective:   Physical Exam Vitals signs and nursing note reviewed.  Constitutional:      General: She is not in acute distress.    Appearance: Normal appearance. She is well-developed.  HENT:     Head: Normocephalic.     Nose: Nose normal.  Eyes:     Pupils: Pupils are equal, round, and reactive to light.  Neck:     Musculoskeletal: Normal range of motion and neck supple.     Vascular: No carotid bruit or JVD.  Cardiovascular:     Rate and Rhythm: Normal rate and regular rhythm.     Heart sounds: Normal heart sounds.  Pulmonary:     Effort: Pulmonary effort is normal. No respiratory distress.     Breath sounds: Normal breath sounds. No wheezing or rales.  Chest:     Chest wall: No tenderness.  Abdominal:     General: Bowel sounds are normal. There is no distension or abdominal bruit.     Palpations:  Abdomen is soft. There is no hepatomegaly, splenomegaly, mass or pulsatile mass.     Tenderness: There is no abdominal tenderness.  Musculoskeletal: Normal range of motion.     Comments: In wheel chair Has frequent jerks of right leg  Lymphadenopathy:     Cervical: No cervical adenopathy.  Skin:    General: Skin is warm and dry.  Neurological:     Mental Status: She is alert and oriented to person, place, and time.     Deep Tendon Reflexes: Reflexes are normal and symmetric.  Psychiatric:        Behavior: Behavior normal.        Thought Content: Thought content normal.        Judgment: Judgment normal.    BP (!) 132/94   Pulse 99   Temp (!) 97 F (36.1 C)  (Oral)         Assessment & Plan:  ADDALYNE VANDEHEI comes in today with chief complaint of Medical Management of Chronic Issues   Diagnosis and orders addressed:  1. Iron deficiency anemia, unspecified iron deficiency anemia type Labs not drawn today  2. Anxiety state Stress management - ALPRAZolam (XANAX) 0.5 MG tablet; TAKE 1 TABLET BY MOUTH TWICE DAILY AS NEEDED FOR ANXIETY.  Dispense: 60 tablet; Refill: 2 - escitalopram (LEXAPRO) 10 MG tablet; Take 1 tablet (10 mg total) by mouth daily.  Dispense: 90 tablet; Refill: 1  3. TOBACCO ABUSE Smoking cessation  4. HSP (hereditary spastic paraplegia) (Tiffany Stewart) Fall precautions rx written for orthotics for left leg - DME Other see comment - tiZANidine (ZANAFLEX) 4 MG tablet; Take 1 tablet (4 mg total) by mouth every 6 (six) hours as needed for muscle spasms.  Dispense: 120 tablet; Refill: 2  5. Irritable bowel syndrome, unspecified type Watch diet  6. Perforated peptic ulcer (Tiffany Stewart)  7. Gastroesophageal reflux disease with esophagitis Avoid spicy foods Do not eat 2 hours prior to bedtime  - pantoprazole (PROTONIX) 40 MG tablet; Take 1 tablet (40 mg total) by mouth 2 (two) times daily before a meal.  Dispense: 60 tablet; Refill: 1   Health Maintenance reviewed Diet and exercise encouraged  Follow up plan: 3 months   Mary-Margaret Hassell Done, FNP

## 2018-09-24 NOTE — Telephone Encounter (Signed)
No call back - this encounter will be closed.  

## 2018-09-24 NOTE — Patient Instructions (Signed)
Fall Prevention in the Home, Adult  Falls can cause injuries. They can happen to people of all ages. There are many things you can do to make your home safe and to help prevent falls. Ask for help when making these changes, if needed.  What actions can I take to prevent falls?  General Instructions  · Use good lighting in all rooms. Replace any light bulbs that burn out.  · Turn on the lights when you go into a dark area. Use night-lights.  · Keep items that you use often in easy-to-reach places. Lower the shelves around your home if necessary.  · Set up your furniture so you have a clear path. Avoid moving your furniture around.  · Do not have throw rugs and other things on the floor that can make you trip.  · Avoid walking on wet floors.  · If any of your floors are uneven, fix them.  · Add color or contrast paint or tape to clearly mark and help you see:  ? Any grab bars or handrails.  ? First and last steps of stairways.  ? Where the edge of each step is.  · If you use a stepladder:  ? Make sure that it is fully opened. Do not climb a closed stepladder.  ? Make sure that both sides of the stepladder are locked into place.  ? Ask someone to hold the stepladder for you while you use it.  · If there are any pets around you, be aware of where they are.  What can I do in the bathroom?         · Keep the floor dry. Clean up any water that spills onto the floor as soon as it happens.  · Remove soap buildup in the tub or shower regularly.  · Use non-skid mats or decals on the floor of the tub or shower.  · Attach bath mats securely with double-sided, non-slip rug tape.  · If you need to sit down in the shower, use a plastic, non-slip stool.  · Install grab bars by the toilet and in the tub and shower. Do not use towel bars as grab bars.  What can I do in the bedroom?  · Make sure that you have a light by your bed that is easy to reach.  · Do not use any sheets or blankets that are too big for your bed. They should  not hang down onto the floor.  · Have a firm chair that has side arms. You can use this for support while you get dressed.  What can I do in the kitchen?  · Clean up any spills right away.  · If you need to reach something above you, use a strong step stool that has a grab bar.  · Keep electrical cords out of the way.  · Do not use floor polish or wax that makes floors slippery. If you must use wax, use non-skid floor wax.  What can I do with my stairs?  · Do not leave any items on the stairs.  · Make sure that you have a light switch at the top of the stairs and the bottom of the stairs. If you do not have them, ask someone to add them for you.  · Make sure that there are handrails on both sides of the stairs, and use them. Fix handrails that are broken or loose. Make sure that handrails are as long as the stairways.  ·   Install non-slip stair treads on all stairs in your home.  · Avoid having throw rugs at the top or bottom of the stairs. If you do have throw rugs, attach them to the floor with carpet tape.  · Choose a carpet that does not hide the edge of the steps on the stairway.  · Check any carpeting to make sure that it is firmly attached to the stairs. Fix any carpet that is loose or worn.  What can I do on the outside of my home?  · Use bright outdoor lighting.  · Regularly fix the edges of walkways and driveways and fix any cracks.  · Remove anything that might make you trip as you walk through a door, such as a raised step or threshold.  · Trim any bushes or trees on the path to your home.  · Regularly check to see if handrails are loose or broken. Make sure that both sides of any steps have handrails.  · Install guardrails along the edges of any raised decks and porches.  · Clear walking paths of anything that might make someone trip, such as tools or rocks.  · Have any leaves, snow, or ice cleared regularly.  · Use sand or salt on walking paths during winter.  · Clean up any spills in your garage right  away. This includes grease or oil spills.  What other actions can I take?  · Wear shoes that:  ? Have a low heel. Do not wear high heels.  ? Have rubber bottoms.  ? Are comfortable and fit you well.  ? Are closed at the toe. Do not wear open-toe sandals.  · Use tools that help you move around (mobility aids) if they are needed. These include:  ? Canes.  ? Walkers.  ? Scooters.  ? Crutches.  · Review your medicines with your doctor. Some medicines can make you feel dizzy. This can increase your chance of falling.  Ask your doctor what other things you can do to help prevent falls.  Where to find more information  · Centers for Disease Control and Prevention, [employer]: https://cdc.gov  · National Institute on Aging: https://go4life.nia.nih.gov  Contact a doctor if:  · You are afraid of falling at home.  · You feel weak, drowsy, or dizzy at home.  · You fall at home.  Summary  · There are many simple things that you can do to make your home safe and to help prevent falls.  · Ways to make your home safe include removing tripping hazards and installing grab bars in the bathroom.  · Ask for help when making these changes in your home.  This information is not intended to replace advice given to you by your health care provider. Make sure you discuss any questions you have with your health care provider.  Document Released: 05/27/2009 Document Revised: 03/15/2017 Document Reviewed: 03/15/2017  Elsevier Interactive Patient Education © 2019 [employer] Inc[employer]

## 2018-10-15 ENCOUNTER — Other Ambulatory Visit: Payer: Self-pay | Admitting: Nurse Practitioner

## 2018-10-15 DIAGNOSIS — G114 Hereditary spastic paraplegia: Secondary | ICD-10-CM

## 2018-10-16 NOTE — Telephone Encounter (Signed)
Last seen 09/24/18  This is not on the list

## 2018-10-17 NOTE — Telephone Encounter (Signed)
[id number] 3/5 

## 2018-11-01 NOTE — Telephone Encounter (Signed)
Multiple attempts made to contact patient.  This encounter will now be closed  

## 2018-12-08 ENCOUNTER — Encounter: Payer: Self-pay | Admitting: General Surgery

## 2018-12-16 ENCOUNTER — Other Ambulatory Visit: Payer: Self-pay | Admitting: Nurse Practitioner

## 2018-12-16 DIAGNOSIS — F411 Generalized anxiety disorder: Secondary | ICD-10-CM

## 2018-12-23 ENCOUNTER — Ambulatory Visit (INDEPENDENT_AMBULATORY_CARE_PROVIDER_SITE_OTHER): Payer: Medicare Other | Admitting: Nurse Practitioner

## 2018-12-23 ENCOUNTER — Encounter: Payer: Self-pay | Admitting: Nurse Practitioner

## 2018-12-23 ENCOUNTER — Other Ambulatory Visit: Payer: Self-pay

## 2018-12-23 DIAGNOSIS — K589 Irritable bowel syndrome without diarrhea: Secondary | ICD-10-CM | POA: Diagnosis not present

## 2018-12-23 DIAGNOSIS — F17200 Nicotine dependence, unspecified, uncomplicated: Secondary | ICD-10-CM

## 2018-12-23 DIAGNOSIS — D509 Iron deficiency anemia, unspecified: Secondary | ICD-10-CM

## 2018-12-23 DIAGNOSIS — G114 Hereditary spastic paraplegia: Secondary | ICD-10-CM

## 2018-12-23 DIAGNOSIS — K275 Chronic or unspecified peptic ulcer, site unspecified, with perforation: Secondary | ICD-10-CM

## 2018-12-23 DIAGNOSIS — K2100 Gastro-esophageal reflux disease with esophagitis, without bleeding: Secondary | ICD-10-CM

## 2018-12-23 DIAGNOSIS — F411 Generalized anxiety disorder: Secondary | ICD-10-CM | POA: Diagnosis not present

## 2018-12-23 MED ORDER — PANTOPRAZOLE SODIUM 40 MG PO TBEC: 40.0000 mg | DELAYED_RELEASE_TABLET | Freq: Two times a day (BID) | ORAL | 1 refills | Status: DC

## 2018-12-23 MED ORDER — TIZANIDINE HCL 4 MG PO TABS: 4.0000 mg | ORAL_TABLET | Freq: Four times a day (QID) | ORAL | 2 refills | Status: DC | PRN

## 2018-12-23 MED ORDER — ESCITALOPRAM OXALATE 10 MG PO TABS: 10.0000 mg | ORAL_TABLET | Freq: Every day | ORAL | 1 refills | Status: DC

## 2018-12-23 MED ORDER — ALPRAZOLAM 0.5 MG PO TABS: ORAL_TABLET | ORAL | 2 refills | Status: DC

## 2018-12-23 NOTE — Progress Notes (Signed)
Virtual Visit via telephone Note  I connected with Tiffany Stewart on 12/23/18 at 11:45 AM by telephone and verified that I am speaking with the correct person using two identifiers. Tiffany Stewart is currently located at home and no one is currently with her during visit. The provider, Mary-Margaret Hassell Done, FNP is located in their office at time of visit.  I discussed the limitations, risks, security and privacy concerns of performing an evaluation and management service by telephone and the availability of in person appointments. I also discussed with the patient that there may be a patient responsible charge related to this service. The patient expressed understanding and agreed to proceed.   History and Present Illness:   Chief Complaint: Medical Management of Chronic Issues    HPI:  1. Gastroesophageal reflux disease with esophagitis Takes daily protonix and has very few symptoms. She has a very poor appetite.  2. Irritable bowel syndrome, unspecified type Seems to be doing okay  3. Anxiety state Says that she is very anxious. Lots of stress at home.she does take xanax BID which helps.  4. Iron deficiency anemia, unspecified iron deficiency anemia type Her last  hgb was 13.3  5. TOBACCO ABUSE Smokes over a pack a day  6. HSP (hereditary spastic paraplegia) ([place]) She is in a wheel chair and has spastic movement. Her lower legs are getting worse and she can hardly stand anymore.   7. Perforated peptic ulcer ([place]) Has done well since ulcer.    Outpatient Encounter Medications as of 12/23/2018  Medication Sig  . acetaminophen (TYLENOL) 500 MG tablet Take 1,500 mg by mouth 2 (two) times daily as needed for headache.   . ALPRAZolam (XANAX) 0.5 MG tablet TAKE 1 TABLET BY MOUTH TWICE DAILY AS NEEDED FOR ANXIETY.  [name] escitalopram (LEXAPRO) 10 MG tablet Take 1 tablet (10 mg total) by mouth daily.  . pantoprazole (PROTONIX) 40 MG tablet Take 1 tablet (40 mg total) by mouth 2 (two)  times daily before a meal.  . tiZANidine (ZANAFLEX) 4 MG tablet Take 1 tablet (4 mg total) by mouth every 6 (six) hours as needed for muscle spasms.     New complaints: None today  Social history: She lives with her daughter and grandchildren     Review of Systems  Constitutional: Negative for diaphoresis and weight loss.  Eyes: Negative for blurred vision, double vision and pain.  Respiratory: Negative for shortness of breath.   Cardiovascular: Negative for chest pain, palpitations, orthopnea and leg swelling.  Gastrointestinal: Negative for abdominal pain.  Musculoskeletal: Negative for falls.  Skin: Negative for rash.  Neurological: Negative for dizziness, sensory change, loss of consciousness, weakness and headaches.  Endo/Heme/Allergies: Negative for polydipsia. Does not bruise/bleed easily.  Psychiatric/Behavioral: Negative for memory loss. The patient does not have insomnia.   All other systems reviewed and are negative.    Observations/Objective: Alert and oriented-answers all questions appropriately No distress  Assessment and Plan: Tiffany Stewart comes in today with chief complaint of Medical Management of Chronic Issues   Diagnosis and orders addressed:  1. Gastroesophageal reflux disease with esophagitis Avoid spicy foods Do not eat 2 hours prior to bedtime - pantoprazole (PROTONIX) 40 MG tablet; Take 1 tablet (40 mg total) by mouth 2 (two) times daily before a meal.  Dispense: 180 tablet; Refill: 1  2. Irritable bowel syndrome, unspecified type Controls with diet  3. Anxiety state Stress management - escitalopram (LEXAPRO) 10 MG tablet; Take 1 tablet (10 mg  total) by mouth daily.  Dispense: 90 tablet; Refill: 1 - ALPRAZolam (XANAX) 0.5 MG tablet; TAKE 1 TABLET BY MOUTH TWICE DAILY AS NEEDED FOR ANXIETY.  Dispense: 60 tablet; Refill: 2  4. Iron deficiency anemia, unspecified iron deficiency anemia type Will check labs at next visit  5. TOBACCO ABUSE  Smoking cessation encouraged  6. HSP (hereditary spastic paraplegia) ([facility])  tiZANidine (ZANAFLEX) 4 MG tablet; Take 1 tablet (4 mg total) by mouth every 6 (six) hours as needed for muscle spasms.  Dispense: 120 tablet; Refill: 2  7. Perforated peptic ulcer ([place]) Avoid NSAIDS,   8. Fall hazard Fall precautions  Previous labs reviewed Health Maintenance reviewed Diet and exercise encouraged  Follow up plan: 3 months     I discussed the assessment and treatment plan with the patient. The patient was provided an opportunity to ask questions and all were answered. The patient agreed with the plan and demonstrated an understanding of the instructions.   The patient was advised to call back or seek an in-person evaluation if the symptoms worsen or if the condition fails to improve as anticipated.  The above assessment and management plan was discussed with the patient. The patient verbalized understanding of and has agreed to the management plan. Patient is aware to call the clinic if symptoms persist or worsen. Patient is aware when to return to the clinic for a follow-up visit. Patient educated on when it is appropriate to go to the emergency department.   Time call ended:  12:00 PM  I provided 15 minutes of non-face-to-face time during this encounter.    Mary-Margaret Hassell Done, FNP

## 2019-03-18 ENCOUNTER — Other Ambulatory Visit: Payer: Self-pay | Admitting: Nurse Practitioner

## 2019-03-18 DIAGNOSIS — F411 Generalized anxiety disorder: Secondary | ICD-10-CM

## 2019-03-25 ENCOUNTER — Encounter: Payer: Self-pay | Admitting: Nurse Practitioner

## 2019-03-25 ENCOUNTER — Ambulatory Visit (INDEPENDENT_AMBULATORY_CARE_PROVIDER_SITE_OTHER): Payer: Medicare Other | Admitting: Nurse Practitioner

## 2019-03-25 ENCOUNTER — Other Ambulatory Visit: Payer: Self-pay

## 2019-03-25 VITALS — BP 130/92 | HR 86 | Temp 98.9°F

## 2019-03-25 DIAGNOSIS — F17200 Nicotine dependence, unspecified, uncomplicated: Secondary | ICD-10-CM

## 2019-03-25 DIAGNOSIS — K2100 Gastro-esophageal reflux disease with esophagitis, without bleeding: Secondary | ICD-10-CM

## 2019-03-25 DIAGNOSIS — F411 Generalized anxiety disorder: Secondary | ICD-10-CM | POA: Diagnosis not present

## 2019-03-25 DIAGNOSIS — G114 Hereditary spastic paraplegia: Secondary | ICD-10-CM

## 2019-03-25 DIAGNOSIS — G43109 Migraine with aura, not intractable, without status migrainosus: Secondary | ICD-10-CM

## 2019-03-25 DIAGNOSIS — E43 Unspecified severe protein-calorie malnutrition: Secondary | ICD-10-CM

## 2019-03-25 DIAGNOSIS — K589 Irritable bowel syndrome without diarrhea: Secondary | ICD-10-CM

## 2019-03-25 DIAGNOSIS — D509 Iron deficiency anemia, unspecified: Secondary | ICD-10-CM | POA: Diagnosis not present

## 2019-03-25 MED ORDER — TOPIRAMATE 50 MG PO TABS: 50.0000 mg | ORAL_TABLET | Freq: Two times a day (BID) | ORAL | 1 refills | Status: DC

## 2019-03-25 MED ORDER — PANTOPRAZOLE SODIUM 40 MG PO TBEC: 40.0000 mg | DELAYED_RELEASE_TABLET | Freq: Two times a day (BID) | ORAL | 1 refills | Status: DC

## 2019-03-25 MED ORDER — TIZANIDINE HCL 4 MG PO TABS: 4.0000 mg | ORAL_TABLET | Freq: Four times a day (QID) | ORAL | 2 refills | Status: DC | PRN

## 2019-03-25 MED ORDER — ALPRAZOLAM 0.5 MG PO TABS: ORAL_TABLET | ORAL | 2 refills | Status: DC

## 2019-03-25 MED ORDER — ESCITALOPRAM OXALATE 10 MG PO TABS: 10.0000 mg | ORAL_TABLET | Freq: Every day | ORAL | 1 refills | Status: DC

## 2019-03-25 NOTE — Patient Instructions (Signed)

## 2019-03-25 NOTE — Progress Notes (Signed)
Subjective:    Patient ID: Tiffany Stewart, female    DOB: [birth year], 48 y.o.   MRN: [id number]   Chief Complaint: medical management of chronic issues   HPI:  1. Gastroesophageal reflux disease with esophagitis Patient is on daily dose of protonix which seems to work well for her.  2. Anxiety state Patient is currently on xanax BID- she stays anxious all the time. meds do help some. GAD 7 : Generalized Anxiety Score 01/28/2018 10/11/2016 06/29/2016 03/07/2016  Nervous, Anxious, on Edge _0 Control/stop worrying _1 Worry too much - different things _2 Trouble relaxing _3 Restless 0 _4 Easily annoyed or irritable _5 Afraid - awful might happen _6 Total GAD 7 Score _7 Anxiety Difficulty Somewhat difficult Somewhat difficult Very difficult Somewhat difficult     3. Iron deficiency anemia, unspecified iron deficiency anemia type She is on no iron supplements. Last hgb was 13.3. will recheck today  4. Protein-calorie malnutrition, severe She has had trouble maintaining her weight. Weight unchanged today  5. TOBACCO ABUSE Smokes over a pack a day  6. HSP (hereditary spastic paraplegia) ([place]) Is in a wheel chair all the time. Is not able to stand for very long periods of time.  7. Irritable bowel syndrome, unspecified type She was on baclofen but OD on it so it was stopped  8. migraines Has migraines almost every day. Usually in temporal area. She has been taking aleve OTC. Would like to try topomax    Outpatient Encounter Medications as of 03/25/2019  Medication Sig  . acetaminophen (TYLENOL) 500 MG tablet Take 1,500 mg by mouth 2 (two) times daily as needed for headache.   . ALPRAZolam (XANAX) 0.5 MG tablet TAKE 1 TABLET BY MOUTH TWICE DAILY AS NEEDED FOR ANXIETY.  [name] escitalopram (LEXAPRO) 10 MG tablet Take 1 tablet (10 mg total) by mouth daily.  . pantoprazole (PROTONIX) 40 MG tablet Take 1 tablet (40 mg total) by mouth 2  (two) times daily before a meal.  . tiZANidine (ZANAFLEX) 4 MG tablet Take 1 tablet (4 mg total) by mouth every 6 (six) hours as needed for muscle spasms.    Past Surgical History:  Procedure Laterality Date  . CESAREAN SECTION    . CHOLECYSTECTOMY    . ENDOMETRIAL ABLATION  2000  . GASTRORRHAPHY  03/17/2018   Procedure: GASTRORRHAPHY;  Surgeon: Aviva Signs, MD;  Location: [facility];  Service: General;;  . HERNIA REPAIR    . LAPAROTOMY N/A 03/17/2018   Procedure: EXPLORATORY LAPAROTOMY;  Surgeon: Aviva Signs, MD;  Location: [facility];  Service: General;  Laterality: N/A;    Family History  Problem Relation Age of Onset  . Diabetes Father   . Cancer Father        lung cancer  . Heart disease Father   . Heart disease Maternal Grandmother   . Stroke Maternal Grandfather   . Diabetes Paternal Grandmother   . Colon cancer Neg Hx     New complaints: None today  Social history: Has a daughter and grandchildren that live with her  Controlled substance contract: 03/25/19    Review of Systems  Constitutional: Negative for activity change and appetite change.  HENT: Negative.   Eyes: Negative for pain.  Respiratory: Negative for shortness of breath.   Cardiovascular: Negative for chest pain, palpitations  and leg swelling.  Gastrointestinal: Negative for abdominal pain.  Endocrine: Negative for polydipsia.  Genitourinary: Negative.   Skin: Negative for rash.  Neurological: Negative for dizziness, weakness and headaches.  Hematological: Does not bruise/bleed easily.  Psychiatric/Behavioral: Negative.   All other systems reviewed and are negative.      Objective:   Physical Exam Vitals signs and nursing note reviewed.  Constitutional:      General: She is not in acute distress.    Appearance: Normal appearance. She is well-developed.  HENT:     Head: Normocephalic.     Nose: Nose normal.  Eyes:     Pupils: Pupils are equal, round, and reactive to light.  Neck:      Musculoskeletal: Normal range of motion and neck supple.     Vascular: No carotid bruit or JVD.  Cardiovascular:     Rate and Rhythm: Normal rate and regular rhythm.     Heart sounds: Normal heart sounds.  Pulmonary:     Effort: Pulmonary effort is normal. No respiratory distress.     Breath sounds: Normal breath sounds. No wheezing or rales.  Chest:     Chest wall: No tenderness.  Abdominal:     General: Bowel sounds are normal. There is no distension or abdominal bruit.     Palpations: Abdomen is soft. There is no hepatomegaly, splenomegaly, mass or pulsatile mass.     Tenderness: There is no abdominal tenderness.  Musculoskeletal: Normal range of motion.     Comments: In wheel chair  Lymphadenopathy:     Cervical: No cervical adenopathy.  Skin:    General: Skin is warm and dry.  Neurological:     Mental Status: She is alert and oriented to person, place, and time.     Deep Tendon Reflexes: Reflexes are normal and symmetric.     Comments: Spastic DTRS bil lower ext  Psychiatric:        Behavior: Behavior normal.        Thought Content: Thought content normal.        Judgment: Judgment normal.    BP (!) 130/92   Pulse 86   Temp 98.9 F (37.2 C) (Oral)        Assessment & Plan:  Tiffany Stewart comes in today with chief complaint of Medical Management of Chronic Issues   Diagnosis and orders addressed:  1. Gastroesophageal reflux disease with esophagitis Avoid spicy foods Do not eat 2 hours prior to bedtime - pantoprazole (PROTONIX) 40 MG tablet; Take 1 tablet (40 mg total) by mouth 2 (two) times daily before a meal.  Dispense: 180 tablet; Refill: 1  2. Anxiety state Stress management - escitalopram (LEXAPRO) 10 MG tablet; Take 1 tablet (10 mg total) by mouth daily.  Dispense: 90 tablet; Refill: 1 - ALPRAZolam (XANAX) 0.5 MG tablet; TAKE 1 TABLET BY MOUTH TWICE DAILY AS NEEDED FOR ANXIETY.  Dispense: 60 tablet; Refill: 2  3. Iron deficiency anemia, unspecified iron  deficiency anemia type Labs pending - CMP14+EGFR - CBC with Differential/Platelet  4. Protein-calorie malnutrition, severe Increase protein in diet  5. TOBACCO ABUSE Smoking cessation encourgaed  6. HSP (hereditary spastic paraplegia) ([facility]) - tiZANidine (ZANAFLEX) 4 MG tablet; Take 1 tablet (4 mg total) by mouth every 6 (six) hours as needed for muscle spasms.  Dispense: 120 tablet; Refill: 2  7. Irritable bowel syndrome, unspecified type Avoid foods that cause flare up  8. Migraine with aura and without status migrainosus, not intractable Started on topamax   to see if will help prevent her dialy migraines. Avoid caffeine  Labs pending Health Maintenance reviewed Diet and exercise encouraged  Follow up plan: 3 months   Mary-Margaret , FNP  

## 2019-03-26 LAB — CMP14+EGFR
ALT: 9 IU/L (ref 0–32)
AST: 21 IU/L (ref 0–40)
Albumin/Globulin Ratio: 1.6 (ref 1.2–2.2)
Albumin: 4.2 g/dL (ref 3.8–4.8)
Alkaline Phosphatase: 66 IU/L (ref 39–117)
BUN/Creatinine Ratio: 37 — ABNORMAL HIGH (ref 9–23)
BUN: 21 mg/dL (ref 6–24)
Bilirubin Total: <0.2 mg/dL (ref 0.0–1.2)
CO2: 23 mmol/L (ref 20–29)
Calcium: 8.9 mg/dL (ref 8.7–10.2)
Chloride: 103 mmol/L (ref 96–106)
Creatinine, Ser: 0.57 mg/dL (ref 0.57–1.00)
GFR calc Af Amer: 129 mL/min/{1.73_m2} (ref >59)
GFR calc non Af Amer: 112 mL/min/{1.73_m2} (ref >59)
Globulin, Total: 2.7 g/dL (ref 1.5–4.5)
Glucose: 77 mg/dL (ref 65–99)
Potassium: 5.2 mmol/L (ref 3.5–5.2)
Sodium: 139 mmol/L (ref 134–144)
Total Protein: 6.9 g/dL (ref 6.0–8.5)

## 2019-03-26 LAB — CBC WITH DIFFERENTIAL/PLATELET
Basophils Absolute: 0 10*3/uL (ref 0.0–0.2)
Basos: 0 %
EOS (ABSOLUTE): 0.1 10*3/uL (ref 0.0–0.4)
Eos: 1 %
Hematocrit: 38 % (ref 34.0–46.6)
Hemoglobin: 13.4 g/dL (ref 11.1–15.9)
Immature Grans (Abs): 0 10*3/uL (ref 0.0–0.1)
Immature Granulocytes: 0 %
Lymphocytes Absolute: 2.3 10*3/uL (ref 0.7–3.1)
Lymphs: 33 %
MCH: 32.4 pg (ref 26.6–33.0)
MCHC: 35.3 g/dL (ref 31.5–35.7)
MCV: 92 fL (ref 79–97)
Monocytes Absolute: 0.3 10*3/uL (ref 0.1–0.9)
Monocytes: 5 %
Neutrophils Absolute: 4.2 10*3/uL (ref 1.4–7.0)
Neutrophils: 61 %
Platelets: 294 10*3/uL (ref 150–450)
RBC: 4.14 x10E6/uL (ref 3.77–5.28)
RDW: 12.1 % (ref 11.7–15.4)
WBC: 7 10*3/uL (ref 3.4–10.8)

## 2019-06-20 ENCOUNTER — Other Ambulatory Visit: Payer: Self-pay | Admitting: Nurse Practitioner

## 2019-06-20 DIAGNOSIS — F411 Generalized anxiety disorder: Secondary | ICD-10-CM

## 2019-06-20 NOTE — Telephone Encounter (Signed)
Needs to be seen

## 2019-06-21 ENCOUNTER — Other Ambulatory Visit: Payer: Self-pay | Admitting: Nurse Practitioner

## 2019-06-24 ENCOUNTER — Telehealth: Payer: Self-pay | Admitting: Nurse Practitioner

## 2019-06-24 NOTE — Telephone Encounter (Signed)
Patient aware will have to be refilled at her appt

## 2019-06-26 ENCOUNTER — Ambulatory Visit (INDEPENDENT_AMBULATORY_CARE_PROVIDER_SITE_OTHER): Payer: Medicare Other | Admitting: Nurse Practitioner

## 2019-06-26 ENCOUNTER — Other Ambulatory Visit: Payer: Self-pay

## 2019-06-26 ENCOUNTER — Encounter: Payer: Self-pay | Admitting: Nurse Practitioner

## 2019-06-26 VITALS — BP 121/85 | HR 93 | Temp 97.0°F | Ht 60.0 in

## 2019-06-26 DIAGNOSIS — E43 Unspecified severe protein-calorie malnutrition: Secondary | ICD-10-CM | POA: Diagnosis not present

## 2019-06-26 DIAGNOSIS — F411 Generalized anxiety disorder: Secondary | ICD-10-CM

## 2019-06-26 DIAGNOSIS — K2100 Gastro-esophageal reflux disease with esophagitis, without bleeding: Secondary | ICD-10-CM

## 2019-06-26 DIAGNOSIS — D509 Iron deficiency anemia, unspecified: Secondary | ICD-10-CM

## 2019-06-26 DIAGNOSIS — G43101 Migraine with aura, not intractable, with status migrainosus: Secondary | ICD-10-CM

## 2019-06-26 DIAGNOSIS — G114 Hereditary spastic paraplegia: Secondary | ICD-10-CM

## 2019-06-26 DIAGNOSIS — K589 Irritable bowel syndrome without diarrhea: Secondary | ICD-10-CM

## 2019-06-26 MED ORDER — ALPRAZOLAM 0.5 MG PO TABS: ORAL_TABLET | ORAL | 2 refills | Status: DC

## 2019-06-26 MED ORDER — PANTOPRAZOLE SODIUM 40 MG PO TBEC: 40.0000 mg | DELAYED_RELEASE_TABLET | Freq: Two times a day (BID) | ORAL | 1 refills | Status: DC

## 2019-06-26 MED ORDER — ESCITALOPRAM OXALATE 10 MG PO TABS: 10.0000 mg | ORAL_TABLET | Freq: Every day | ORAL | 1 refills | Status: DC

## 2019-06-26 MED ORDER — TOPIRAMATE 50 MG PO TABS: ORAL_TABLET | ORAL | 2 refills | Status: DC

## 2019-06-26 MED ORDER — TIZANIDINE HCL 4 MG PO TABS: 4.0000 mg | ORAL_TABLET | Freq: Four times a day (QID) | ORAL | 2 refills | Status: DC | PRN

## 2019-06-26 NOTE — Patient Instructions (Signed)

## 2019-06-26 NOTE — Progress Notes (Signed)
Subjective:    Patient ID: Tiffany Stewart, female    DOB: [birth year], 48 y.o.   MRN: [id number]   Chief Complaint: Medical Management of Chronic Issues    HPI:  1. HSP (hereditary spastic paraplegia) ([place]) Is unable to walk due to her spastic legs. Sh eis in wheel chair most of the time.  2. Protein-calorie malnutrition, severe Poor appetite. She does not drink protien shskes  3. Iron deficiency anemia, unspecified iron deficiency anemia type She is not c/o fatigue Lab Results  Component Value Date   HGB 13.4 03/25/2019     4. Anxiety state Is doing better because her niece moved out and she is abl to get more done. Is moore active now. GAD 7 : Generalized Anxiety Score 06/26/2019 03/25/2019 01/28/2018 10/11/2016  Nervous, Anxious, on Edge _0 Control/stop worrying _1 Worry too much - different things _2 Trouble relaxing _3 Restless 0 1 0 3  Easily annoyed or irritable 1 0 1 2  Afraid - awful might happen 1 0 2 1  Total GAD 7 Score _4 Anxiety Difficulty Somewhat difficult Somewhat difficult Somewhat difficult Somewhat difficult     5. Gastroesophageal reflux disease with esophagitis without hemorrhage Is on protonix daily and does well to keep symptoms under control.  6. Irritable bowel syndrome, unspecified type No recent flare ups    Outpatient Encounter Medications as of 06/26/2019  Medication Sig  . ALPRAZolam (XANAX) 0.5 MG tablet TAKE 1 TABLET BY MOUTH TWICE DAILY AS NEEDED FOR ANXIETY.  [name] escitalopram (LEXAPRO) 10 MG tablet Take 1 tablet (10 mg total) by mouth daily.  [name] tiZANidine (ZANAFLEX) 4 MG tablet Take 1 tablet (4 mg total) by mouth every 6 (six) hours as needed for muscle spasms.  [name] topiramate (TOPAMAX) 50 MG tablet TAKE (1) TABLET TWICE DAILY.  [name] acetaminophen (TYLENOL) 500 MG tablet Take 1,500 mg by mouth 2 (two) times daily as needed for headache.   . pantoprazole (PROTONIX) 40 MG tablet Take 1 tablet (40 mg total) by  mouth 2 (two) times daily before a meal. (Patient not taking: Reported on 06/26/2019)     Past Surgical History:  Procedure Laterality Date  . CESAREAN SECTION    . CHOLECYSTECTOMY    . ENDOMETRIAL ABLATION  2000  . GASTRORRHAPHY  03/17/2018   Procedure: GASTRORRHAPHY;  Surgeon: Aviva Signs, MD;  Location: [facility];  Service: General;;  . HERNIA REPAIR    . LAPAROTOMY N/A 03/17/2018   Procedure: EXPLORATORY LAPAROTOMY;  Surgeon: Aviva Signs, MD;  Location: [facility];  Service: General;  Laterality: N/A;    Family History  Problem Relation Age of Onset  . Diabetes Father   . Cancer Father        lung cancer  . Heart disease Father   . Heart disease Maternal Grandmother   . Stroke Maternal Grandfather   . Diabetes Paternal Grandmother   . Colon cancer Neg Hx     New complaints: None today  Social history: As a friend that lives with her and helps her  Controlled substance contract: 03/27/19    Review of Systems  Constitutional: Negative for activity change and appetite change.  HENT: Negative.   Eyes: Negative for pain.  Respiratory: Negative for shortness of breath.   Cardiovascular: Negative for chest pain, palpitations and leg swelling.  Gastrointestinal: Negative for abdominal pain.  Endocrine: Negative for  polydipsia.  Genitourinary: Negative.   Skin: Negative for rash.  Neurological: Negative for dizziness, weakness and headaches.  Hematological: Does not bruise/bleed easily.  Psychiatric/Behavioral: Negative.   All other systems reviewed and are negative.      Objective:   Physical Exam Vitals signs and nursing note reviewed.  Constitutional:      General: She is not in acute distress.    Appearance: Normal appearance. She is well-developed.  HENT:     Head: Normocephalic.     Nose: Nose normal.  Eyes:     Pupils: Pupils are equal, round, and reactive to light.  Neck:     Musculoskeletal: Normal range of motion and neck supple.     Vascular: No  carotid bruit or JVD.  Cardiovascular:     Rate and Rhythm: Normal rate and regular rhythm.     Heart sounds: Normal heart sounds.  Pulmonary:     Effort: Pulmonary effort is normal. No respiratory distress.     Breath sounds: Normal breath sounds. No wheezing or rales.  Chest:     Chest wall: No tenderness.  Abdominal:     General: Bowel sounds are normal. There is no distension or abdominal bruit.     Palpations: Abdomen is soft. There is no hepatomegaly, splenomegaly, mass or pulsatile mass.     Tenderness: There is no abdominal tenderness.  Musculoskeletal: Normal range of motion.  Lymphadenopathy:     Cervical: No cervical adenopathy.  Skin:    General: Skin is warm and dry.  Neurological:     Mental Status: She is alert and oriented to person, place, and time.     Deep Tendon Reflexes: Reflexes are normal and symmetric.  Psychiatric:        Behavior: Behavior normal.        Thought Content: Thought content normal.        Judgment: Judgment normal.    BP 121/85   Pulse 93   Temp (!) 97 F (36.1 C) (Temporal)   Ht 5' (1.524 m)   SpO2 94%   BMI 23.29 kg/m         Assessment & Plan:  Tiffany Stewart comes in today with chief complaint of Medical Management of Chronic Issues   Diagnosis and orders addressed:  1. HSP (hereditary spastic paraplegia) ([place]) Fall prvention - tiZANidine (ZANAFLEX) 4 MG tablet; Take 1 tablet (4 mg total) by mouth every 6 (six) hours as needed for muscle spasms.  Dispense: 120 tablet; Refill: 2  2. Protein-calorie malnutrition, severe Add protein shake to diet - CMP14+EGFR  3. Iron deficiency anemia, unspecified iron deficiency anemia type laqbs pending - CBC with Differential/Platelet  4. Anxiety state stres management - escitalopram (LEXAPRO) 10 MG tablet; Take 1 tablet (10 mg total) by mouth daily.  Dispense: 90 tablet; Refill: 1 - ALPRAZolam (XANAX) 0.5 MG tablet; TAKE 1 TABLET BY MOUTH TWICE DAILY AS NEEDED FOR ANXIETY.   Dispense: 60 tablet; Refill: 2  5. Gastroesophageal reflux disease with esophagitis without hemorrhage Avoid spicy foods Do not eat 2 hours prior to bedtime - pantoprazole (PROTONIX) 40 MG tablet; Take 1 tablet (40 mg total) by mouth 2 (two) times daily before a meal.  Dispense: 180 tablet; Refill: 1  6. Irritable bowel syndrome, unspecified type Watch diet that causes flare up  7. Migraine with aura and with status migrainosus, not intractable - topiramate (TOPAMAX) 50 MG tablet; TAKE (1) TABLET TWICE DAILY.  Dispense: 60 tablet; Refill: 2   Labs  pending Health Maintenance reviewed Diet and exercise encouraged  Follow up plan: 3 months   Mary-Margaret Hassell Done, FNP

## 2019-06-27 LAB — CBC WITH DIFFERENTIAL/PLATELET
Basophils Absolute: 0 10*3/uL (ref 0.0–0.2)
Basos: 1 %
EOS (ABSOLUTE): 0 10*3/uL (ref 0.0–0.4)
Eos: 1 %
Hematocrit: 42.2 % (ref 34.0–46.6)
Hemoglobin: 14.7 g/dL (ref 11.1–15.9)
Immature Grans (Abs): 0 10*3/uL (ref 0.0–0.1)
Immature Granulocytes: 0 %
Lymphocytes Absolute: 2.6 10*3/uL (ref 0.7–3.1)
Lymphs: 40 %
MCH: 33.9 pg — ABNORMAL HIGH (ref 26.6–33.0)
MCHC: 34.8 g/dL (ref 31.5–35.7)
MCV: 97 fL (ref 79–97)
Monocytes Absolute: 0.3 10*3/uL (ref 0.1–0.9)
Monocytes: 4 %
Neutrophils Absolute: 3.5 10*3/uL (ref 1.4–7.0)
Neutrophils: 54 %
Platelets: 316 10*3/uL (ref 150–450)
RBC: 4.34 x10E6/uL (ref 3.77–5.28)
RDW: 12.5 % (ref 11.7–15.4)
WBC: 6.4 10*3/uL (ref 3.4–10.8)

## 2019-06-27 LAB — CMP14+EGFR
ALT: 12 IU/L (ref 0–32)
AST: 16 IU/L (ref 0–40)
Albumin/Globulin Ratio: 1.7 (ref 1.2–2.2)
Albumin: 4.7 g/dL (ref 3.8–4.8)
Alkaline Phosphatase: 80 IU/L (ref 39–117)
BUN/Creatinine Ratio: 19 (ref 9–23)
BUN: 15 mg/dL (ref 6–24)
Bilirubin Total: 0.2 mg/dL (ref 0.0–1.2)
CO2: 21 mmol/L (ref 20–29)
Calcium: 9.2 mg/dL (ref 8.7–10.2)
Chloride: 103 mmol/L (ref 96–106)
Creatinine, Ser: 0.79 mg/dL (ref 0.57–1.00)
GFR calc Af Amer: 104 mL/min/{1.73_m2} (ref >59)
GFR calc non Af Amer: 90 mL/min/{1.73_m2} (ref >59)
Globulin, Total: 2.7 g/dL (ref 1.5–4.5)
Glucose: 115 mg/dL — ABNORMAL HIGH (ref 65–99)
Potassium: 3.9 mmol/L (ref 3.5–5.2)
Sodium: 139 mmol/L (ref 134–144)
Total Protein: 7.4 g/dL (ref 6.0–8.5)

## 2019-07-17 ENCOUNTER — Telehealth: Payer: Self-pay | Admitting: Nurse Practitioner

## 2019-07-17 NOTE — Telephone Encounter (Addendum)
Patient wants to be called back by MMM nurse regarding getting paperwork for a friend of hers who is her driver and helps her get the things she needs, so that he can be reimbursed for all of his help.  Pt says if you cant reach her at her number, you can call [phone] Furniture conservator/restorer)

## 2019-07-23 NOTE — Telephone Encounter (Signed)
Pt states we have written a letter for her in the past and it looks like we did in 2018. Ok to do another letter for pt?

## 2019-07-24 ENCOUNTER — Telehealth: Payer: Self-pay | Admitting: Nurse Practitioner

## 2019-07-24 ENCOUNTER — Encounter: Payer: Self-pay | Admitting: Nurse Practitioner

## 2019-07-24 NOTE — Telephone Encounter (Signed)
Note patient is requesting is up front to pick up.

## 2019-07-24 NOTE — Telephone Encounter (Signed)
Patients friend who is listed on patients acct called back stating that he received a missed call from [place]. Could be regarding previous telephone call that patient made. Requesting call back.

## 2019-07-24 NOTE — Telephone Encounter (Signed)
Called patient had to leave a message note up front to be picked up

## 2019-07-24 NOTE — Telephone Encounter (Signed)
[place] for letter- ccan copy prevous

## 2019-09-26 ENCOUNTER — Ambulatory Visit (INDEPENDENT_AMBULATORY_CARE_PROVIDER_SITE_OTHER): Payer: Medicare Other | Admitting: Nurse Practitioner

## 2019-09-26 ENCOUNTER — Encounter: Payer: Self-pay | Admitting: Nurse Practitioner

## 2019-09-26 DIAGNOSIS — G114 Hereditary spastic paraplegia: Secondary | ICD-10-CM | POA: Diagnosis not present

## 2019-09-26 DIAGNOSIS — F17200 Nicotine dependence, unspecified, uncomplicated: Secondary | ICD-10-CM

## 2019-09-26 DIAGNOSIS — K2100 Gastro-esophageal reflux disease with esophagitis, without bleeding: Secondary | ICD-10-CM

## 2019-09-26 DIAGNOSIS — G43101 Migraine with aura, not intractable, with status migrainosus: Secondary | ICD-10-CM

## 2019-09-26 DIAGNOSIS — K589 Irritable bowel syndrome without diarrhea: Secondary | ICD-10-CM | POA: Diagnosis not present

## 2019-09-26 DIAGNOSIS — F411 Generalized anxiety disorder: Secondary | ICD-10-CM

## 2019-09-26 DIAGNOSIS — D509 Iron deficiency anemia, unspecified: Secondary | ICD-10-CM

## 2019-09-26 MED ORDER — ESCITALOPRAM OXALATE 10 MG PO TABS: 10.0000 mg | ORAL_TABLET | Freq: Every day | ORAL | 1 refills | Status: DC

## 2019-09-26 MED ORDER — ALPRAZOLAM 0.5 MG PO TABS: ORAL_TABLET | ORAL | 2 refills | Status: DC

## 2019-09-26 MED ORDER — PANTOPRAZOLE SODIUM 40 MG PO TBEC: 40.0000 mg | DELAYED_RELEASE_TABLET | Freq: Two times a day (BID) | ORAL | 1 refills | Status: DC

## 2019-09-26 MED ORDER — TIZANIDINE HCL 4 MG PO TABS: 4.0000 mg | ORAL_TABLET | Freq: Four times a day (QID) | ORAL | 2 refills | Status: DC | PRN

## 2019-09-26 MED ORDER — TOPIRAMATE 50 MG PO TABS: ORAL_TABLET | ORAL | 2 refills | Status: DC

## 2019-09-26 NOTE — Progress Notes (Signed)
Virtual Visit via telephone Note Due to COVID-19 pandemic this visit was conducted virtually. This visit type was conducted due to national recommendations for restrictions regarding the COVID-19 Pandemic (e.g. social distancing, sheltering in place) in an effort to limit this patient's exposure and mitigate transmission in our community. All issues noted in this document were discussed and addressed.  A physical exam was not performed with this format.  I connected with Tiffany Stewart on 09/26/19 at 2:10 by telephone and verified that I am speaking with the correct person using two identifiers. Tiffany Stewart is currently located at home  and no one is currently with her during visit. The provider, Mary-Margaret Hassell Done, FNP is located in their office at time of visit.  I discussed the limitations, risks, security and privacy concerns of performing an evaluation and management service by telephone and the availability of in person appointments. I also discussed with the patient that there may be a patient responsible charge related to this service. The patient expressed understanding and agreed to proceed.   History and Present Illness:   Chief Complaint: Medical Management of Chronic Issues    HPI:  1. HSP (hereditary spastic paraplegia) ([place]) Patient is in wheel chair. Can only bare a slight amount of weight on lower ext.  She is able to perform her ADL's on her own.  2. Irritable bowel syndrome, unspecified type She has had no recent flare ups.  3. Gastroesophageal reflux disease with esophagitis without hemorrhage *is on protinix and she is having no issues, since she had perforation repair.  4. Migraine with aura and with status migrainosus, not intractable She is having about 1 per week. Tylenol usually helps. The topamax has helped cut down the numbers of headaches.  5. Anxiety state She is doing well. She is on xanax BID without problems. GAD 7 : Generalized Anxiety Score  09/26/2019 06/26/2019 03/25/2019 01/28/2018  Nervous, Anxious, on Edge 1 1 3 2   Control/stop worrying 2 1 2 2   Worry too much - different things 2 1 2 2   Trouble relaxing 1 1 1 1   Restless 0 0 1 0  Easily annoyed or irritable 1 1 0 1  Afraid - awful might happen 0 1 0 2  Total GAD 7 Score 7 6 9 10   Anxiety Difficulty Somewhat difficult Somewhat difficult Somewhat difficult Somewhat difficult      6. Iron deficiency anemia, unspecified iron deficiency anemia type Has less fatigue lately. Lab Results  Component Value Date   HGB 14.7 06/26/2019     7. TOBACCO ABUSE *smoking about a pack a day.    Outpatient Encounter Medications as of 09/26/2019  Medication Sig  . acetaminophen (TYLENOL) 500 MG tablet Take 1,500 mg by mouth 2 (two) times daily as needed for headache.   . ALPRAZolam (XANAX) 0.5 MG tablet TAKE 1 TABLET BY MOUTH TWICE DAILY AS NEEDED FOR ANXIETY.  [name] escitalopram (LEXAPRO) 10 MG tablet Take 1 tablet (10 mg total) by mouth daily.  . pantoprazole (PROTONIX) 40 MG tablet Take 1 tablet (40 mg total) by mouth 2 (two) times daily before a meal.  . tiZANidine (ZANAFLEX) 4 MG tablet Take 1 tablet (4 mg total) by mouth every 6 (six) hours as needed for muscle spasms.  [name] topiramate (TOPAMAX) 50 MG tablet TAKE (1) TABLET TWICE DAILY.     Past Surgical History:  Procedure Laterality Date  . CESAREAN SECTION    . CHOLECYSTECTOMY    . ENDOMETRIAL ABLATION  2000  . GASTRORRHAPHY  03/17/2018   Procedure: Toniann Ket;  Surgeon: Aviva Signs, MD;  Location: [facility];  Service: General;;  . HERNIA REPAIR    . LAPAROTOMY N/A 03/17/2018   Procedure: EXPLORATORY LAPAROTOMY;  Surgeon: Aviva Signs, MD;  Location: [facility];  Service: General;  Laterality: N/A;    Family History  Problem Relation Age of Onset  . Diabetes Father   . Cancer Father        lung cancer  . Heart disease Father   . Heart disease Maternal Grandmother   . Stroke Maternal Grandfather   . Diabetes Paternal  Grandmother   . Colon cancer Neg Hx     New complaints: None today  Social history: Has someone that lives with her that helps her  Controlled substance contract: n/a    Review of Systems  Constitutional: Negative for diaphoresis and weight loss.  Eyes: Negative for blurred vision, double vision and pain.  Respiratory: Negative for shortness of breath.   Cardiovascular: Negative for chest pain, palpitations, orthopnea and leg swelling.  Gastrointestinal: Negative for abdominal pain.  Musculoskeletal: Positive for back pain and myalgias.  Skin: Negative for rash.  Neurological: Negative for dizziness, sensory change, loss of consciousness, weakness and headaches.  Endo/Heme/Allergies: Negative for polydipsia. Does not bruise/bleed easily.  Psychiatric/Behavioral: Negative for memory loss. The patient does not have insomnia.   All other systems reviewed and are negative.    Observations/Objective: Alert and oriented- answers all questions appropriately No distress    Assessment and Plan: Tiffany Stewart comes in today with chief complaint of Medical Management of Chronic Issues   Diagnosis and orders addressed:  1. HSP (hereditary spastic paraplegia) ([place]) Fall precautions - tiZANidine (ZANAFLEX) 4 MG tablet; Take 1 tablet (4 mg total) by mouth every 6 (six) hours as needed for muscle spasms.  Dispense: 120 tablet; Refill: 2  2. Irritable bowel syndrome, unspecified type Watch diet to prevent flare ups  3. Gastroesophageal reflux disease with esophagitis without hemorrhage Avoid spicy foods Do not eat 2 hours prior to bedtime - pantoprazole (PROTONIX) 40 MG tablet; Take 1 tablet (40 mg total) by mouth 2 (two) times daily before a meal.  Dispense: 180 tablet; Refill: 1  4. Migraine with aura and with status migrainosus, not intractable Avoid caffeine - topiramate (TOPAMAX) 50 MG tablet; TAKE (1) TABLET TWICE DAILY.  Dispense: 60 tablet; Refill: 2  5. Anxiety  state Stress management - escitalopram (LEXAPRO) 10 MG tablet; Take 1 tablet (10 mg total) by mouth daily.  Dispense: 90 tablet; Refill: 1 - ALPRAZolam (XANAX) 0.5 MG tablet; TAKE 1 TABLET BY MOUTH TWICE DAILY AS NEEDED FOR ANXIETY.  Dispense: 60 tablet; Refill: 2  6. Iron deficiency anemia, unspecified iron deficiency anemia type Continue daily iron supplement  7. TOBACCO ABUSE Smoking cessation encouraged   Labs pending Health Maintenance reviewed Diet and exercise encouraged  Follow Up Instructions: 3 months    I discussed the assessment and treatment plan with the patient. The patient was provided an opportunity to ask questions and all were answered. The patient agreed with the plan and demonstrated an understanding of the instructions.   The patient was advised to call back or seek an in-person evaluation if the symptoms worsen or if the condition fails to improve as anticipated.  The above assessment and management plan was discussed with the patient. The patient verbalized understanding of and has agreed to the management plan. Patient is aware to call the clinic if symptoms  persist or worsen. Patient is aware when to return to the clinic for a follow-up visit. Patient educated on when it is appropriate to go to the emergency department.   Time call ended:  2:24  I provided 14 minutes of non-face-to-face time during this encounter.    Mary-Margaret Hassell Done, FNP

## 2019-09-29 ENCOUNTER — Telehealth: Payer: Self-pay | Admitting: Nurse Practitioner

## 2019-10-10 ENCOUNTER — Telehealth: Payer: Self-pay | Admitting: Nurse Practitioner

## 2019-10-10 NOTE — Telephone Encounter (Signed)
Patient aware will have to wait on PCP

## 2019-10-10 NOTE — Telephone Encounter (Signed)
She will need to speak with PCP on Monday

## 2019-10-10 NOTE — Telephone Encounter (Signed)
Is to early for   tiZANidine (ZANAFLEX) 4 MG tablet    Patient states she needs something for her leg cramps. [facility] uses [facility]

## 2019-10-13 ENCOUNTER — Ambulatory Visit (INDEPENDENT_AMBULATORY_CARE_PROVIDER_SITE_OTHER): Payer: Medicare Other | Admitting: *Deleted

## 2019-10-13 DIAGNOSIS — Z0000 Encounter for general adult medical examination without abnormal findings: Secondary | ICD-10-CM | POA: Diagnosis not present

## 2019-10-13 NOTE — Progress Notes (Signed)
MEDICARE ANNUAL WELLNESS VISIT  10/13/2019  Telephone Visit Disclaimer This Medicare AWV was conducted by telephone due to national recommendations for restrictions regarding the COVID-19 Pandemic (e.g. social distancing).  I verified, using two identifiers, that I am speaking with Tiffany Stewart or their authorized healthcare agent. I discussed the limitations, risks, security, and privacy concerns of performing an evaluation and management service by telephone and the potential availability of an in-person appointment in the future. The patient expressed understanding and agreed to proceed.   Subjective:  Tiffany Stewart is a 49 y.o. female patient of Chevis Pretty, [place] who had a Medicare Annual Wellness Visit today via telephone. Tiffany Stewart is Disabled and lives with her room mate. she has 3 children. she reports that she is socially active and does interact with friends/family regularly. she is minimally physically active and enjoys doing jigsaw puzzles and playing games on her phone.  Patient Care Team: Chevis Pretty, FNP as PCP - General (Nurse Practitioner) Danie Binder, MD as Consulting Physician (Gastroenterology)  Advanced Directives 10/13/2019 04/05/2018 04/05/2018 03/23/2018 03/17/2018 03/16/2018 05/01/2017  Does Patient Have a Medical Advance Directive? No No No No No No No  Type of Advance Directive - - - - - - -  Copy of Healthcare Power of Attorney in Chart? - - - - - - -  Would patient like information on creating a medical advance directive? No - Patient declined No - Patient declined No - Patient declined No - Patient declined No - Patient declined - No - Patient declined    Hospital Utilization Over the Past 12 Months: # of hospitalizations or ER visits: 0 # of surgeries: 0  Review of Systems    Patient reports that her overall health is worse compared to last year.  History obtained from chart review  Patient Reported Readings (BP, Pulse, CBG, Weight,  etc) none  Pain Assessment Pain : 0-10 Pain Score: 9  Pain Type: Chronic pain Pain Location: Leg Pain Descriptors / Indicators: Cramping Pain Onset: More than a month ago Pain Relieving Factors: resting, muscle relaxers Effect of Pain on Daily Activities: pt is wheelchair bound  Pain Relieving Factors: resting, muscle relaxers  Current Medications & Allergies (verified) Allergies as of 10/13/2019      Reactions   Hctz [hydrochlorothiazide] Diarrhea   Morphine Other (See Comments)   Flushing, turns skin red    Zoloft [sertraline Hcl] Other (See Comments)   Spaced out      Medication List       Accurate as of October 13, 2019  9:02 AM. If you have any questions, ask your nurse or doctor.        acetaminophen 500 MG tablet Commonly known as: TYLENOL Take 1,500 mg by mouth 2 (two) times daily as needed for headache.   ALPRAZolam 0.5 MG tablet Commonly known as: XANAX TAKE 1 TABLET BY MOUTH TWICE DAILY AS NEEDED FOR ANXIETY.   escitalopram 10 MG tablet Commonly known as: LEXAPRO Take 1 tablet (10 mg total) by mouth daily.   pantoprazole 40 MG tablet Commonly known as: PROTONIX Take 1 tablet (40 mg total) by mouth 2 (two) times daily before a meal.   tiZANidine 4 MG tablet Commonly known as: ZANAFLEX Take 1 tablet (4 mg total) by mouth every 6 (six) hours as needed for muscle spasms.   topiramate 50 MG tablet Commonly known as: TOPAMAX TAKE (1) TABLET TWICE DAILY.       History (reviewed): Past Medical  History:  Diagnosis Date  . Anxiety   . Chronic pain   . Depression   . GERD (gastroesophageal reflux disease)   . HSP (hereditary spastic paraplegia) ([facility])   . Migraines   . Perforated peptic ulcer ([place]) 03/17/2018   Goody powder overuse   Past Surgical History:  Procedure Laterality Date  . CESAREAN SECTION    . CHOLECYSTECTOMY    . ENDOMETRIAL ABLATION  2000  . GASTRORRHAPHY  03/17/2018   Procedure: GASTRORRHAPHY;  Surgeon: Aviva Signs, MD;   Location: [facility];  Service: General;;  . HERNIA REPAIR    . LAPAROTOMY N/A 03/17/2018   Procedure: EXPLORATORY LAPAROTOMY;  Surgeon: Aviva Signs, MD;  Location: [facility];  Service: General;  Laterality: N/A;   Family History  Problem Relation Age of Onset  . Diabetes Father   . Cancer Father        lung cancer  . Heart disease Father   . Heart disease Maternal Grandmother   . Stroke Maternal Grandfather   . Diabetes Paternal Grandmother   . Colon cancer Neg Hx    Social History   Socioeconomic History  . Marital status: Single    Spouse name: Not on file  . Number of children: 3  . Years of education: 10  . Highest education level: 10th grade  Occupational History  . Occupation: disabled  Tobacco Use  . Smoking status: Current Every Day Smoker    Packs/day: 0.50    Years: 27.00    Pack years: 13.50    Types: Cigarettes  . Smokeless tobacco: Never Used  Substance and Sexual Activity  . Alcohol use: No  . Drug use: No  . Sexual activity: Not Currently    Birth control/protection: None  Other Topics Concern  . Not on file  Social History Narrative  . Not on file   Social Determinants of Health   Financial Resource Strain: Medium Risk  . Difficulty of Paying Living Expenses: Somewhat hard  Food Insecurity: No Food Insecurity  . Worried About Charity fundraiser in the Last Year: Never true  . Ran Out of Food in the Last Year: Never true  Transportation Needs: No Transportation Needs  . Lack of Transportation (Medical): No  . Lack of Transportation (Non-Medical): No  Physical Activity: Insufficiently Active  . Days of Exercise per Week: 4 days  . Minutes of Exercise per Session: 30 min  Stress: Stress Concern Present  . Feeling of Stress : To some extent  Social Connections: Moderately Isolated  . Frequency of Communication with Friends and Family: More than three times a week  . Frequency of Social Gatherings with Friends and Family: More than three times a week   . Attends Religious Services: Never  . Active Member of Clubs or Organizations: No  . Attends Archivist Meetings: Never  . Marital Status: Never married    Activities of Daily Living In your present state of health, do you have any difficulty performing the following activities: 10/13/2019  Hearing? N  Vision? Y  Comment needs to go back to the eye doctor-she ran over her glasses  Difficulty concentrating or making decisions? N  Walking or climbing stairs? Y  Comment she is wheelchair bound  Dressing or bathing? Y  Comment she can dress herself but needs assistance when bathing-getting out of the shower  Doing errands, shopping? Y  Comment she uses [employer] and her room mate runs errands for her  Preparing Food and eating ?  N  Using the Toilet? Y  Comment she needs help transferring from the wheelchair to the toilet at times  In the past six months, have you accidently leaked urine? N  Do you have problems with loss of bowel control? N  Managing your Medications? N  Managing your Finances? N  Housekeeping or managing your Housekeeping? Y  Comment Her room mate Rachel Bo helps her  Some recent data might be hidden    Patient Education/ Literacy How often do you need to have someone help you when you read instructions, pamphlets, or other written materials from your doctor or pharmacy?: 1 - Never What is the last grade level you completed in school?: 10th grade  Exercise Current Exercise Habits: Home exercise routine, Type of exercise: stretching, Time (Minutes): 30, Frequency (Times/Week): 4, Weekly Exercise (Minutes/Week): 120, Intensity: Mild, Exercise limited by: orthopedic condition(s)  Diet Patient reports consuming 1 meals a day and 4 snack(s) a day Patient reports that her primary diet is: Regular Patient reports that she does have regular access to food.   Depression Screen PHQ 2/9 Scores 10/13/2019 05/21/2018 01/28/2018 11/02/2017 09/28/2017 06/04/2017 02/27/2017   PHQ - 2 Score 1 2 0 0 4 3 0  PHQ- 9 Score - 8 - 0 7 9 -     Fall Risk Fall Risk  10/13/2019 05/21/2018 02/12/2018 01/28/2018 11/02/2017  Falls in the past year? 0 Yes No No Yes  Comment - - - - last fall March 2019  Number falls in past yr: - 1 - - 2 or more  Injury with Fall? - No - - No  Risk for fall due to : Impaired mobility - - - Impaired mobility;Impaired balance/gait     Objective:  Tiffany Stewart seemed alert and oriented and she participated appropriately during our telephone visit.  Blood Pressure Weight BMI  BP Readings from Last 3 Encounters:  06/26/19 121/85  03/25/19 (!) 130/92  09/24/18 (!) 132/94   Wt Readings from Last 3 Encounters:  04/05/18 119 lb 4.3 oz (54.1 kg)  03/17/18 133 lb 2.5 oz (60.4 kg)  05/03/17 105 lb 6.1 oz (47.8 kg)   BMI Readings from Last 1 Encounters:  06/26/19 23.29 kg/m    *Unable to obtain current vital signs, weight, and BMI due to telephone visit type  Hearing/Vision  . Zana did not seem to have difficulty with hearing/understanding during the telephone conversation . Reports that she has not had a formal eye exam by an eye care professional within the past year . Reports that she has not had a formal hearing evaluation within the past year *Unable to fully assess hearing and vision during telephone visit type  Cognitive Function: 6CIT Screen 10/13/2019  What Year? 0 points  What month? 0 points  What time? 0 points  Count back from 20 0 points  Months in reverse 0 points  Repeat phrase 0 points  Total Score 0   (Normal:0-7, Significant for Dysfunction: >8)  Normal Cognitive Function Screening: Yes   Immunization & Health Maintenance Record  There is no immunization history on file for this patient.  Health Maintenance  Topic Date Due  . PAP SMEAR-Modifier  11/30/1993  . INFLUENZA VACCINE  11/12/2019 (Originally 03/15/2019)  . TETANUS/TDAP  11/13/2022  . HIV Screening  Completed       Assessment  This is a routine  wellness examination for Tiffany Stewart.  Health Maintenance: Due or Overdue Health Maintenance Due  Topic Date Due  . PAP  SMEAR-Modifier  11/30/1993    Tiffany Stewart does not need a referral for Community Assistance: Care Management:   no Social Work:    no Prescription Assistance:  no Nutrition/Diabetes Education:  no   Plan:  Personalized Goals Goals Addressed            This Visit's Progress   . DIET - INCREASE WATER INTAKE       Try to drink 6-8 glasses of water daily      Personalized Health Maintenance & Screening Recommendations  Influenza vaccine  Lung Cancer Screening Recommended: not applicable (Low Dose CT Chest recommended if Age 65-80 years, 30 pack-year currently smoking OR have quit w/in past 15 years) Hepatitis C Screening recommended: no HIV Screening recommended: no  Advanced Directives: Written information was not prepared per patient's request.  Referrals & Orders No orders of the defined types were placed in this encounter.   Follow-up Plan . Follow-up with Chevis Pretty, FNP as planned . Consider your Flu vaccine at your next visit with your PCP   I have personally reviewed and noted the following in the patient's chart:   . Medical and social history . Use of alcohol, tobacco or illicit drugs  . Current medications and supplements . Functional ability and status . Nutritional status . Physical activity . Advanced directives . List of other physicians . Hospitalizations, surgeries, and ER visits in previous 12 months . Vitals . Screenings to include cognitive, depression, and falls . Referrals and appointments  In addition, I have reviewed and discussed with Tiffany Stewart certain preventive protocols, quality metrics, and best practice recommendations. A written personalized care plan for preventive services as well as general preventive health recommendations is available and can be mailed to the patient at her request.       Milas Hock, LPN  [id number]

## 2019-10-13 NOTE — Telephone Encounter (Signed)
There is nothing else we can give her. Why is she out , has she been taing extra tablets. Meds should have lasted until time t orefill.

## 2019-10-13 NOTE — Patient Instructions (Signed)

## 2019-10-13 NOTE — Telephone Encounter (Signed)
NVM °

## 2019-11-18 ENCOUNTER — Telehealth: Payer: Self-pay | Admitting: Nurse Practitioner

## 2019-11-18 NOTE — Telephone Encounter (Signed)
°  Incoming Patient Call  11/18/2019  What symptoms do you have? Legs are locking on her since she was taken off medication. Cramping real bad also  How long have you been sick? Last two weeks  Have you been seen for this problem? NO  If your provider decides to give you a prescription, which pharmacy would you like for it to be sent to? [facility]  Patient informed that this information will be sent to the clinical staff for review and that they should receive a follow up call.

## 2019-11-18 NOTE — Telephone Encounter (Signed)
LM tcb. 

## 2019-11-24 NOTE — Telephone Encounter (Signed)
Left message to please call our office. Still having leg cramps?  What medicine are you referring to?  No response since call on 11-18-19.

## 2019-12-01 ENCOUNTER — Ambulatory Visit: Payer: Medicare Other | Admitting: Nurse Practitioner

## 2019-12-18 ENCOUNTER — Other Ambulatory Visit: Payer: Self-pay | Admitting: Nurse Practitioner

## 2019-12-18 DIAGNOSIS — G43101 Migraine with aura, not intractable, with status migrainosus: Secondary | ICD-10-CM

## 2019-12-18 DIAGNOSIS — G114 Hereditary spastic paraplegia: Secondary | ICD-10-CM

## 2019-12-19 NOTE — Telephone Encounter (Signed)
Patient will need to be seen for refills has appt on 5/14

## 2019-12-26 ENCOUNTER — Ambulatory Visit: Payer: Medicare Other | Admitting: Nurse Practitioner

## 2020-01-05 ENCOUNTER — Other Ambulatory Visit: Payer: Self-pay

## 2020-01-05 ENCOUNTER — Ambulatory Visit (INDEPENDENT_AMBULATORY_CARE_PROVIDER_SITE_OTHER): Payer: Medicare Other | Admitting: Nurse Practitioner

## 2020-01-05 ENCOUNTER — Encounter: Payer: Self-pay | Admitting: Nurse Practitioner

## 2020-01-05 VITALS — BP 137/97 | HR 82 | Temp 98.1°F | Resp 20 | Ht 60.0 in | Wt 118.0 lb

## 2020-01-05 DIAGNOSIS — G114 Hereditary spastic paraplegia: Secondary | ICD-10-CM

## 2020-01-05 MED ORDER — BACLOFEN 10 MG PO TABS: 10.0000 mg | ORAL_TABLET | Freq: Three times a day (TID) | ORAL | 2 refills | Status: DC

## 2020-01-05 NOTE — Patient Instructions (Signed)

## 2020-01-05 NOTE — Progress Notes (Signed)
   Subjective:    Patient ID: AARON BRISON, female    DOB: 04-Apr-1971, 49 y.o.   MRN: [id number]   Chief Complaint: Wants to discuss baclofen   HPI patient has spastic paraplegia and use to be on baclofen tid. She had to go to the hospital a year or so ago and they had originally aid he had OD on her baclofen o we topped giving it to her. But she actually ended up with bleeding ulcer and that is why she was so sick. She has been on zanaflex and that is not helping   Review of Systems  Constitutional: Negative for diaphoresis.  Eyes: Negative for pain.  Respiratory: Negative for shortness of breath.   Cardiovascular: Negative for chest pain, palpitations and leg swelling.  Gastrointestinal: Negative for abdominal pain.  Endocrine: Negative for polydipsia.  Skin: Negative for rash.  Neurological: Negative for dizziness, weakness and headaches.  Hematological: Does not bruise/bleed easily.  All other systems reviewed and are negative.      Objective:   Physical Exam Vitals and nursing note reviewed.  Constitutional:      Appearance: Normal appearance.  Cardiovascular:     Rate and Rhythm: Normal rate and regular rhythm.     Heart sounds: Normal heart sounds.  Pulmonary:     Breath sounds: Normal breath sounds.  Skin:    General: Skin is warm.  Neurological:     General: No focal deficit present.     Mental Status: She is alert and oriented to person, place, and time.  Psychiatric:        Mood and Affect: Mood normal.        Behavior: Behavior normal.    BP (!) 137/97   Pulse 82   Temp 98.1 F (36.7 C) (Temporal)   Resp 20   Ht 5' (1.524 m)   Wt 118 lb (53.5 kg)   SpO2 93%   BMI 23.05 kg/m       Assessment & Plan:  [name] in today with chief complaint of Wants to discuss baclofen   1. HSP (hereditary spastic paraplegia) ([facility]) Sedation precaution Follow up in 2-3 month - baclofen (LIORESAL) 10 MG tablet; Take 1 tablet (10 mg total) by mouth 3 (three)  times daily.  Dispense: 90 each; Refill: 2    The above assessment and management plan was discussed with the patient. The patient verbalized understanding of and has agreed to the management plan. Patient is aware to call the clinic if symptoms persist or worsen. Patient is aware when to return to the clinic for a follow-up visit. Patient educated on when it is appropriate to go to the emergency department.   Mary-Margaret Hassell Done, FNP

## 2020-01-26 ENCOUNTER — Other Ambulatory Visit: Payer: Self-pay | Admitting: Nurse Practitioner

## 2020-01-26 DIAGNOSIS — G114 Hereditary spastic paraplegia: Secondary | ICD-10-CM

## 2020-01-26 DIAGNOSIS — F411 Generalized anxiety disorder: Secondary | ICD-10-CM

## 2020-01-27 IMAGING — RF DG UGI W/ GASTROGRAFIN
11 of 19 series · 14 of 24 positions shown · IV contrast (agent unspecified)
Comparison: CT abdomen and pelvis 03/17/2018

CLINICAL DATA: Perforated gastric ulcer post repair on 03/17/2018

EXAM:
WATER SOLUBLE UPPER GI SERIES
TECHNIQUE: Single-column upper GI series was performed using water soluble
contrast.
CONTRAST:  125 mL Vsovue-JKK IV

[Series 1: cp_standard · 0.19mm/px · 1 of 1 slices shown (1 of 11)]
[im 1/1]
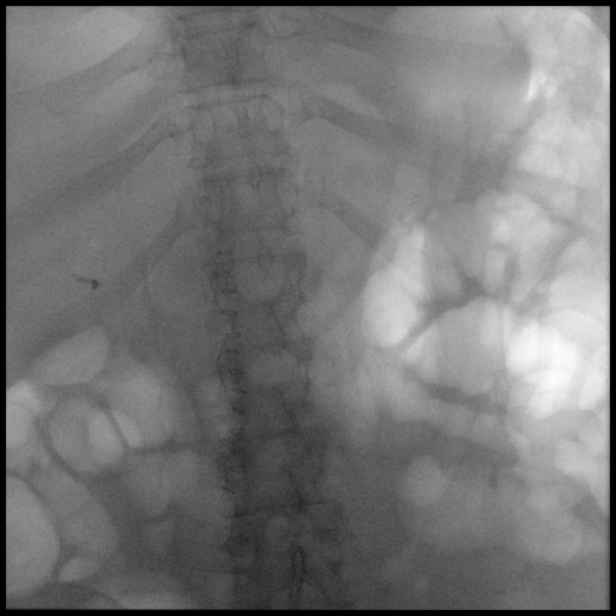

[Series 3: cp_standard · 0.19mm/px · 1 of 1 slices shown (2 of 11)]
[im 1/1]
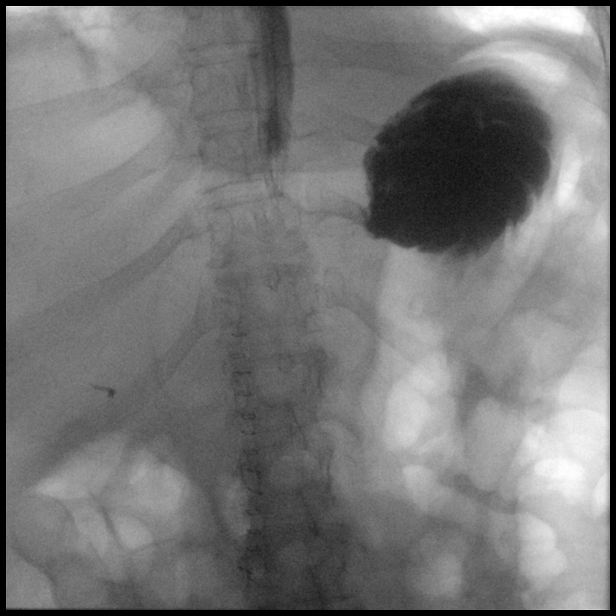

[Series 5: cp_standard · 0.17mm/px · 1 of 1 slices shown (3 of 11)]
[im 1/1]
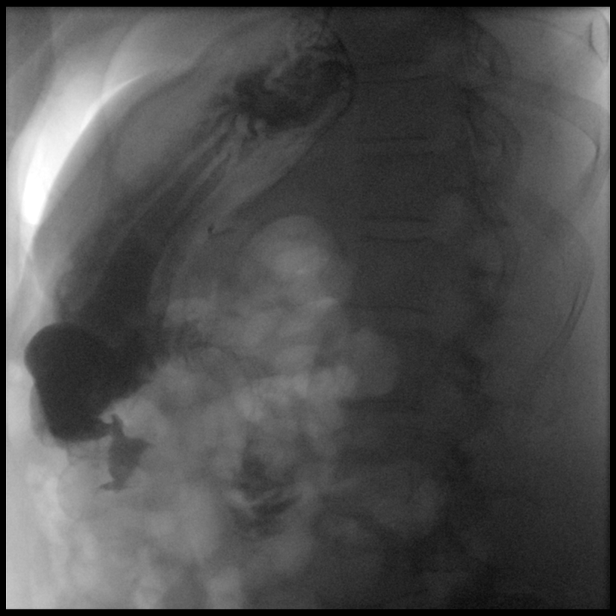

[Series 7: cp_standard · 0.18mm/px · 1 of 1 slices shown (4 of 11)]
[im 1/1]
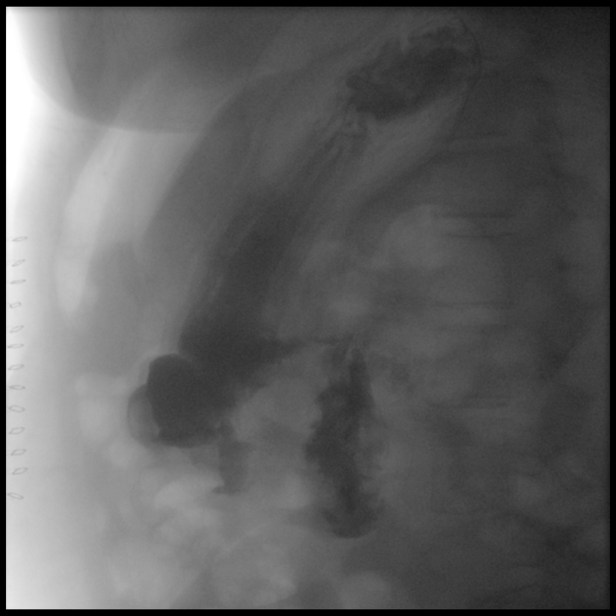

[Series 8: cp_standard · 0.18mm/px · 1 of 1 slices shown (5 of 11)]
[im 1/1]
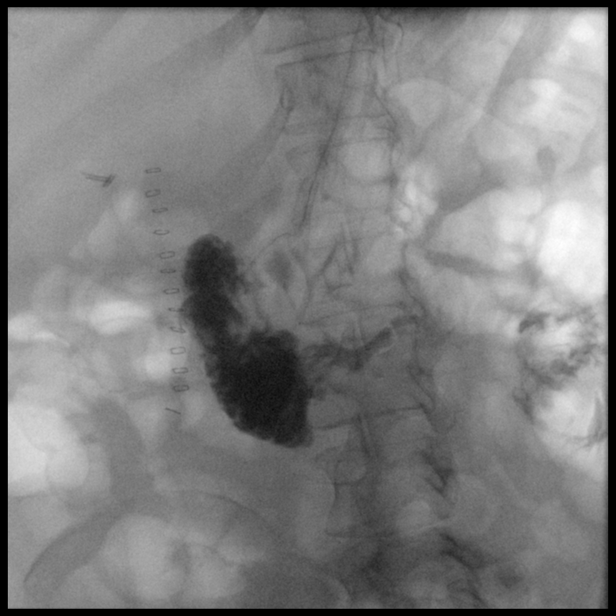

[Series 10: cp_standard · 0.18mm/px · 1 of 1 slices shown (6 of 11)]
[im 1/1]
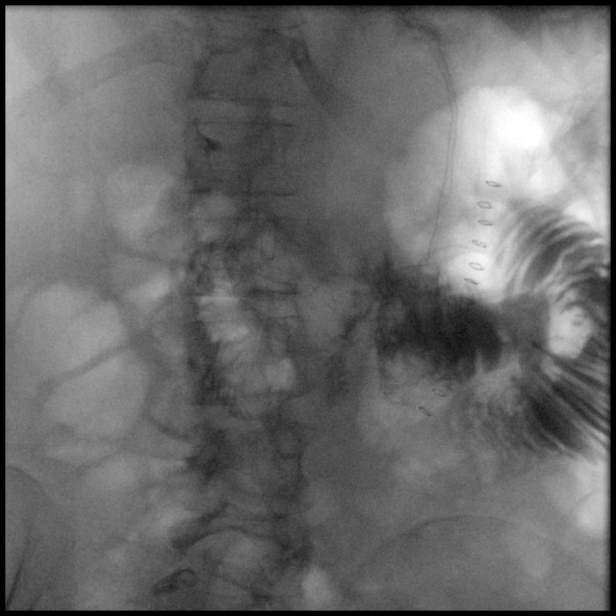

[Series 11: cp_standard · 0.18mm/px · 2 of 132 frames shown (7 of 11)]
[frame 67/132]
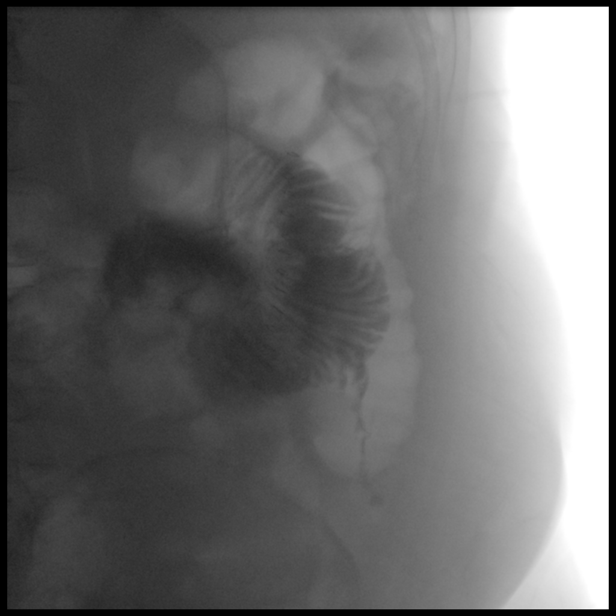
[frame 119/132]
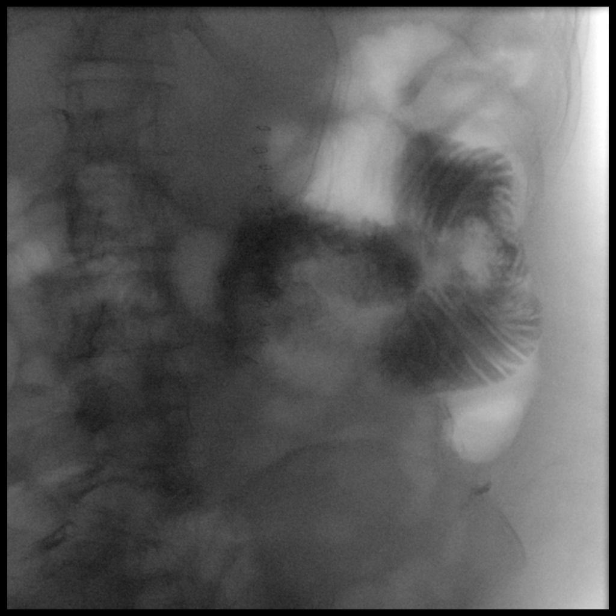

[Series 13: cp_standard · 0.19mm/px · 1 of 1 slices shown (8 of 11)]
[im 1/1]
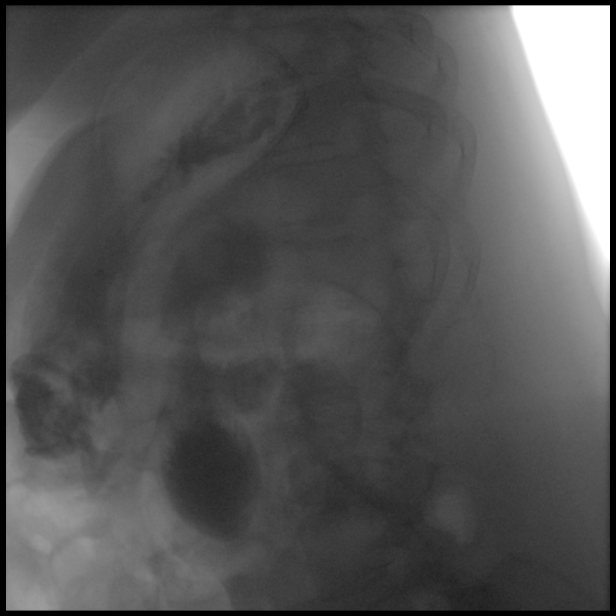

[Series 15: cp_standard · 0.19mm/px · 1 of 1 slices shown (9 of 11)]
[im 1/1]
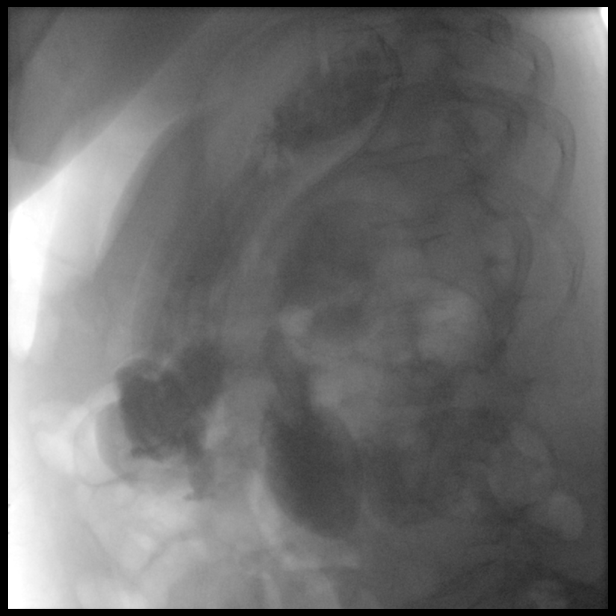

[Series 17: cp_standard · 0.19mm/px · 3 of 52 frames shown (10 of 11)]
[frame 4/52]
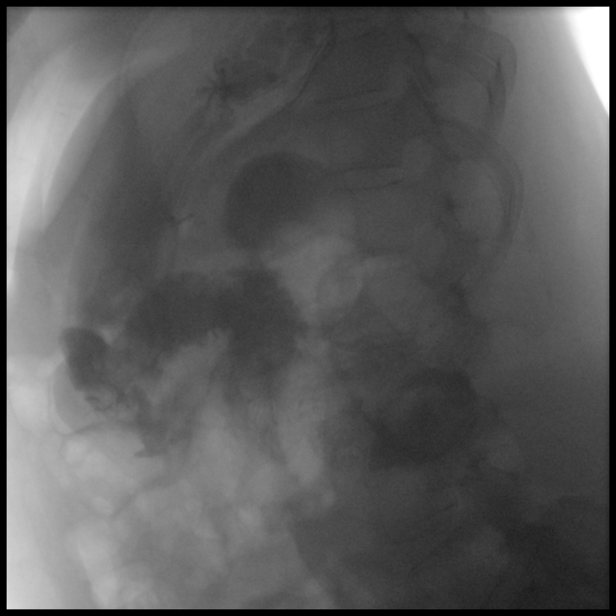
[frame 8/52]
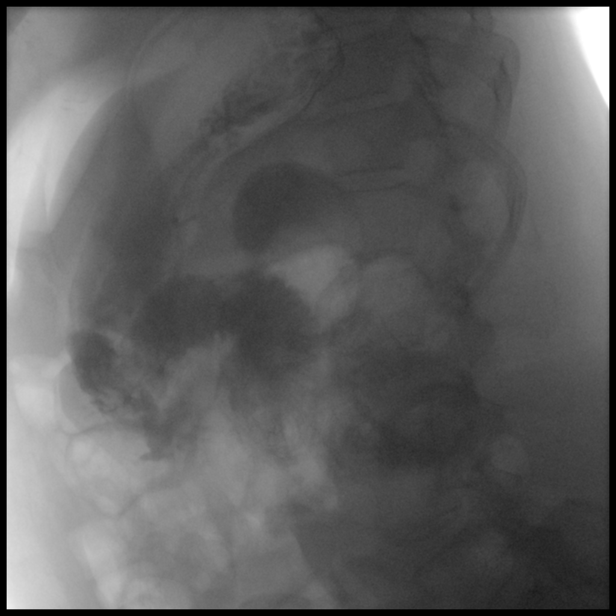
[frame 45/52]
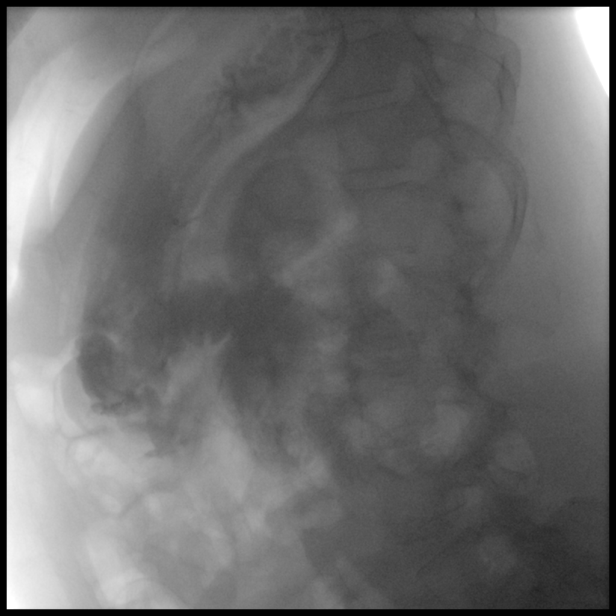

[Series 19: cp_standard · 0.19mm/px · 1 of 1 slices shown (11 of 11)]
[im 1/1]
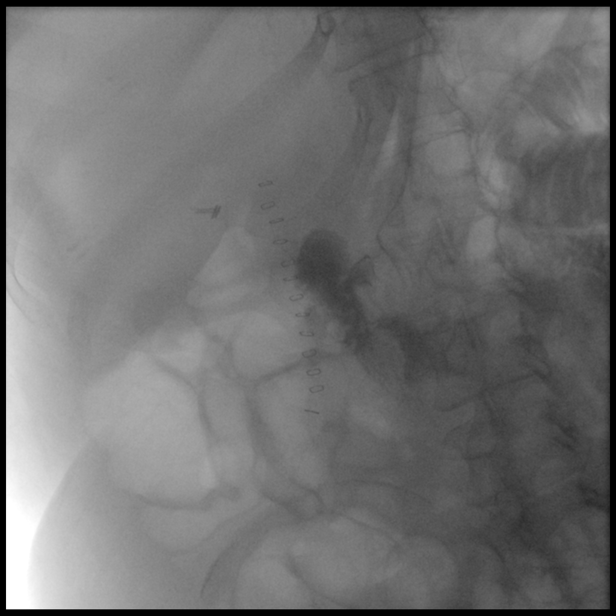

[14 of 24 positions shown; findings below may reference images not displayed]

FLUOROSCOPY TIME:  Fluoroscopy Time:  3 minutes 0 seconds

Radiation Exposure Index (if provided by the fluoroscopic device):
43.6 mGy

Number of Acquired Spot Images: multiple fluoroscopic screen
captures
FINDINGS: No esophageal obstruction.

Mild gastroesophageal reflux seen during exam.

Stomach distends normally without evidence of outlet obstruction.

Normal rugal fold pattern.

At the posterior aspect of the gastric antrum, an abnormal
collection of extraluminal contrast is identified consistent with
perforation, likely at site of gastric perforation identified
intraoperatively.

This collection was contained and persisted throughout the exam.

Duodenal bulb and sweep normal appearance.
IMPRESSION: Extravasation of contrast from the posterior wall of the gastric
antrum compatible with a contained perforation, likely corresponding
to the pre-pyloric ulcer identified posteriorly at time of surgery.

## 2020-02-08 DIAGNOSIS — H5213 Myopia, bilateral: Secondary | ICD-10-CM | POA: Diagnosis not present

## 2020-02-24 ENCOUNTER — Other Ambulatory Visit: Payer: Self-pay | Admitting: Nurse Practitioner

## 2020-02-24 DIAGNOSIS — G114 Hereditary spastic paraplegia: Secondary | ICD-10-CM

## 2020-02-24 DIAGNOSIS — F411 Generalized anxiety disorder: Secondary | ICD-10-CM

## 2020-02-26 ENCOUNTER — Telehealth: Payer: Self-pay | Admitting: Nurse Practitioner

## 2020-02-26 DIAGNOSIS — F411 Generalized anxiety disorder: Secondary | ICD-10-CM

## 2020-02-26 NOTE — Telephone Encounter (Signed)
Has appt with you 04/09/20  She says you told her to call back when she needed a refill on her xanax.

## 2020-02-26 NOTE — Telephone Encounter (Signed)
  Prescription Request  02/26/2020  What is the name of the medication or equipment? ALPRAZolam (XANAX) 0.5 MG tablet Muscle relaxer  Have you contacted your pharmacy to request a refill? (if applicable) YES  Which pharmacy would you like this sent to? [facility] pt was seen in June and told to call back if she needed refills    Patient notified that their request is being sent to the clinical staff for review and that they should receive a response within 2 business days.

## 2020-03-01 ENCOUNTER — Telehealth: Payer: Self-pay | Admitting: Nurse Practitioner

## 2020-03-01 MED ORDER — ALPRAZOLAM 0.5 MG PO TABS: 0.5000 mg | ORAL_TABLET | Freq: Two times a day (BID) | ORAL | 0 refills | Status: DC | PRN

## 2020-03-01 NOTE — Telephone Encounter (Signed)
Pt checking status on muscle relaxer refill.

## 2020-03-01 NOTE — Telephone Encounter (Signed)
Refill on xanax done, but needs to keep follow up apointment

## 2020-03-01 NOTE — Telephone Encounter (Signed)
Patient aware that baclofen and tizanidine should not be given together.  Advised patient if she is taking the tizanidine to not take baclofen.  No new rx sent to pharmacy.  Patient asking if tizanidine could be increased or changed to something stronger because just the tizanidine without the baclofen does not work.  Advised patient she would need to be seen in an office visit appointment to have med change.  Patient verbalized understanding. Has appt scheduled for 8/27 with MMM

## 2020-03-01 NOTE — Telephone Encounter (Signed)
If is on baclofen then should not be on baclofen

## 2020-03-01 NOTE — Telephone Encounter (Signed)
Pt called requesting refill on Tizanidine Rx. Explained to pt that we received the request to have it refilled but it was denied because Rx was discontinued on 01/05/20. Pt wants to know why it was discontinued. Pt says she needs it because she takes it with her Baclofen Rx.

## 2020-03-22 ENCOUNTER — Telehealth: Payer: Self-pay | Admitting: Nurse Practitioner

## 2020-03-22 DIAGNOSIS — G114 Hereditary spastic paraplegia: Secondary | ICD-10-CM

## 2020-03-22 NOTE — Telephone Encounter (Signed)
May need to see ortho- I do not know what else we can try

## 2020-03-22 NOTE — Telephone Encounter (Signed)
Pt called stating that she needs MMM to send her something to the pharmacy for her to start taking with her Baclofen for her leg cramps because her leg cramps are getting worse.

## 2020-03-22 NOTE — Telephone Encounter (Signed)
Pt aware and states she is ok with doing referral.

## 2020-03-29 ENCOUNTER — Other Ambulatory Visit: Payer: Self-pay | Admitting: Nurse Practitioner

## 2020-03-29 DIAGNOSIS — G114 Hereditary spastic paraplegia: Secondary | ICD-10-CM

## 2020-03-29 DIAGNOSIS — F411 Generalized anxiety disorder: Secondary | ICD-10-CM

## 2020-03-30 ENCOUNTER — Other Ambulatory Visit: Payer: Self-pay | Admitting: Nurse Practitioner

## 2020-03-30 DIAGNOSIS — F411 Generalized anxiety disorder: Secondary | ICD-10-CM

## 2020-04-09 ENCOUNTER — Other Ambulatory Visit: Payer: Self-pay

## 2020-04-09 ENCOUNTER — Ambulatory Visit (INDEPENDENT_AMBULATORY_CARE_PROVIDER_SITE_OTHER): Payer: Medicare HMO | Admitting: Nurse Practitioner

## 2020-04-09 ENCOUNTER — Encounter: Payer: Self-pay | Admitting: Nurse Practitioner

## 2020-04-09 VITALS — BP 123/89 | HR 96 | Temp 98.1°F | Resp 20

## 2020-04-09 DIAGNOSIS — F17200 Nicotine dependence, unspecified, uncomplicated: Secondary | ICD-10-CM

## 2020-04-09 DIAGNOSIS — K581 Irritable bowel syndrome with constipation: Secondary | ICD-10-CM

## 2020-04-09 DIAGNOSIS — R6889 Other general symptoms and signs: Secondary | ICD-10-CM | POA: Diagnosis not present

## 2020-04-09 DIAGNOSIS — D509 Iron deficiency anemia, unspecified: Secondary | ICD-10-CM

## 2020-04-09 DIAGNOSIS — E43 Unspecified severe protein-calorie malnutrition: Secondary | ICD-10-CM | POA: Diagnosis not present

## 2020-04-09 DIAGNOSIS — K2100 Gastro-esophageal reflux disease with esophagitis, without bleeding: Secondary | ICD-10-CM

## 2020-04-09 DIAGNOSIS — G114 Hereditary spastic paraplegia: Secondary | ICD-10-CM | POA: Diagnosis not present

## 2020-04-09 DIAGNOSIS — F411 Generalized anxiety disorder: Secondary | ICD-10-CM | POA: Diagnosis not present

## 2020-04-09 MED ORDER — ESCITALOPRAM OXALATE 10 MG PO TABS: 10.0000 mg | ORAL_TABLET | Freq: Every day | ORAL | 1 refills | Status: DC

## 2020-04-09 MED ORDER — ALPRAZOLAM 0.5 MG PO TABS: 0.5000 mg | ORAL_TABLET | Freq: Two times a day (BID) | ORAL | 0 refills | Status: DC | PRN

## 2020-04-09 MED ORDER — BACLOFEN 10 MG PO TABS: 10.0000 mg | ORAL_TABLET | Freq: Three times a day (TID) | ORAL | 3 refills | Status: AC

## 2020-04-09 MED ORDER — PANTOPRAZOLE SODIUM 40 MG PO TBEC: 40.0000 mg | DELAYED_RELEASE_TABLET | Freq: Two times a day (BID) | ORAL | 1 refills | Status: DC

## 2020-04-09 NOTE — Progress Notes (Signed)
Subjective:    Patient ID: Tiffany Stewart, female    DOB: August 24, 1970, 49 y.o.   MRN: [id number]   Chief Complaint: Medical Management of Chronic Issues    HPI:  1. Gastroesophageal reflux disease with esophagitis without hemorrhage Is on protonix daily and is doing well.  2. Irritable bowel syndrome with constipation Doing well right now bowel movements are normal since she had surgery.  3. HSP (hereditary spastic paraplegia) ([facility]CC) Is not able to walk. Uses wheel chair all the time. Can stand to transfer but that is all.  4. Anxiety state Is doing well right now. Is on xanax 0.5mg  BID GAD 7 : Generalized Anxiety Score 04/09/2020 09/26/2019 06/26/2019 03/25/2019  Nervous, Anxious, on Edge 0 1 1 3   Control/stop worrying 0 2 1 2   Worry too much - different things 0 2 1 2   Trouble relaxing 0 1 1 1   Restless 0 0 0 1  Easily annoyed or irritable 0 1 1 0  Afraid - awful might happen 0 0 1 0  Total GAD 7 Score 0 7 6 9   Anxiety Difficulty Not difficult at all Somewhat difficult Somewhat difficult Somewhat difficult      5. Iron deficiency anemia, unspecified iron deficiency anemia type Does not take iron supplement daily. Denies fatigue. Lab Results  Component Value Date   HGB 14.7 06/26/2019     6. Protein-calorie malnutrition, severe Has a very poor appetite Wt Readings from Last 3 Encounters:  01/05/20 118 lb (53.5 kg)  04/05/18 119 lb 4.3 oz (54.1 kg)  03/17/18 133 lb 2.5 oz (60.4 kg)     7. TOBACCO ABUSE Has cut back on her smoking. Smoking less then 2-3 cigarettes a day.    Outpatient Encounter Medications as of 04/09/2020  Medication Sig  . acetaminophen (TYLENOL) 500 MG tablet Take 1,500 mg by mouth 2 (two) times daily as needed for headache.   . ALPRAZolam (XANAX) 0.5 MG tablet Take 1 tablet (0.5 mg total) by mouth 2 (two) times daily as needed. for anxiety  . baclofen (LIORESAL) 10 MG tablet TAKE 1 TABLET BY MOUTH 3 TIMES DAILY.  [name] escitalopram (LEXAPRO) 10  MG tablet Take 1 tablet (10 mg total) by mouth daily.  . pantoprazole (PROTONIX) 40 MG tablet Take 1 tablet (40 mg total) by mouth 2 (two) times daily before a meal.  . topiramate (TOPAMAX) 50 MG tablet TAKE (1) TABLET TWICE DAILY.   No facility-administered encounter medications on file as of 04/09/2020.    Past Surgical History:  Procedure Laterality Date  . CESAREAN SECTION    . CHOLECYSTECTOMY    . ENDOMETRIAL ABLATION  2000  . GASTRORRHAPHY  03/17/2018   Procedure: GASTRORRHAPHY;  Surgeon: Aviva Signs, MD;  Location: [facility];  Service: General;;  . HERNIA REPAIR    . LAPAROTOMY N/A 03/17/2018   Procedure: EXPLORATORY LAPAROTOMY;  Surgeon: Aviva Signs, MD;  Location: [facility];  Service: General;  Laterality: N/A;    Family History  Problem Relation Age of Onset  . Diabetes Father   . Cancer Father        lung cancer  . Heart disease Father   . Heart disease Maternal Grandmother   . Stroke Maternal Grandfather   . Diabetes Paternal Grandmother   . Colon cancer Neg Hx     New complaints: none  Social history: Lives with her daughter  Controlled substance contract: 04/09/20    Review of Systems  Constitutional: Negative for diaphoresis.  Eyes:  Negative for pain.  Respiratory: Negative for shortness of breath.   Cardiovascular: Negative for chest pain, palpitations and leg swelling.  Gastrointestinal: Negative for abdominal pain.  Endocrine: Negative for polydipsia.  Skin: Negative for rash.  Neurological: Negative for dizziness, weakness and headaches.  Hematological: Does not bruise/bleed easily.  All other systems reviewed and are negative.      Objective:   Physical Exam Vitals and nursing note reviewed.  Constitutional:      General: She is not in acute distress.    Appearance: Normal appearance. She is well-developed.  HENT:     Head: Normocephalic.     Nose: Nose normal.  Eyes:     Pupils: Pupils are equal, round, and reactive to light.  Neck:      Vascular: No carotid bruit or JVD.  Cardiovascular:     Rate and Rhythm: Normal rate and regular rhythm.     Heart sounds: Normal heart sounds.  Pulmonary:     Effort: Pulmonary effort is normal. No respiratory distress.     Breath sounds: Normal breath sounds. No wheezing or rales.  Chest:     Chest wall: No tenderness.  Abdominal:     General: Bowel sounds are normal. There is no distension or abdominal bruit.     Palpations: Abdomen is soft. There is no hepatomegaly, splenomegaly, mass or pulsatile mass.     Tenderness: There is no abdominal tenderness.  Musculoskeletal:        General: Normal range of motion.     Cervical back: Normal range of motion and neck supple.     Comments: bil lower ext weakness In wheel chair Not able to get on exam table  Lymphadenopathy:     Cervical: No cervical adenopathy.  Skin:    General: Skin is warm and dry.  Neurological:     Mental Status: She is alert and oriented to person, place, and time.     Deep Tendon Reflexes: Reflexes are normal and symmetric.  Psychiatric:        Behavior: Behavior normal.        Thought Content: Thought content normal.        Judgment: Judgment normal.     BP 123/89   Pulse 96   Temp 98.1 F (36.7 C) (Temporal)   Resp 20   SpO2 97%       Assessment & Plan:  KIKI BIVENS comes in today with chief complaint of Medical Management of Chronic Issues   Diagnosis and orders addressed:  1. Gastroesophageal reflux disease with esophagitis without hemorrhage Avoid spicy foods Do not eat 2 hours prior to bedtime - pantoprazole (PROTONIX) 40 MG tablet; Take 1 tablet (40 mg total) by mouth 2 (two) times daily before a meal.  Dispense: 180 tablet; Refill: 1  2. Irritable bowel syndrome with constipation Continue to wtahc diet  3. HSP (hereditary spastic paraplegia) (HCC) Fall prevention - baclofen (LIORESAL) 10 MG tablet; Take 1 tablet (10 mg total) by mouth 3 (three) times daily.  Dispense: 90 tablet;  Refill: 3  4. Anxiety state Stress management - ALPRAZolam (XANAX) 0.5 MG tablet; Take 1 tablet (0.5 mg total) by mouth 2 (two) times daily as needed. for anxiety  Dispense: 60 tablet; Refill: 0 - escitalopram (LEXAPRO) 10 MG tablet; Take 1 tablet (10 mg total) by mouth daily.  Dispense: 90 tablet; Refill: 1  5. Iron deficiency anemia, unspecified iron deficiency anemia type Take daily vitamin with iron  6. Protein-calorie malnutrition, severe  Increase protein in diet  7. TOBACCO ABUSE continue with smoking cessation   Labs pending Health Maintenance reviewed Diet and exercise encouraged  Follow up plan: 6 months   Mary-Margaret Hassell Done, FNP

## 2020-04-09 NOTE — Patient Instructions (Signed)

## 2020-05-05 ENCOUNTER — Other Ambulatory Visit: Payer: Self-pay | Admitting: Nurse Practitioner

## 2020-05-05 DIAGNOSIS — F411 Generalized anxiety disorder: Secondary | ICD-10-CM

## 2020-06-03 ENCOUNTER — Other Ambulatory Visit: Payer: Self-pay | Admitting: Nurse Practitioner

## 2020-06-03 DIAGNOSIS — F411 Generalized anxiety disorder: Secondary | ICD-10-CM

## 2020-07-05 ENCOUNTER — Other Ambulatory Visit: Payer: Self-pay | Admitting: Nurse Practitioner

## 2020-07-05 DIAGNOSIS — F411 Generalized anxiety disorder: Secondary | ICD-10-CM

## 2020-07-06 ENCOUNTER — Other Ambulatory Visit: Payer: Self-pay | Admitting: Nurse Practitioner

## 2020-07-06 DIAGNOSIS — F411 Generalized anxiety disorder: Secondary | ICD-10-CM

## 2020-07-13 ENCOUNTER — Encounter: Payer: Self-pay | Admitting: Nurse Practitioner

## 2020-07-13 ENCOUNTER — Ambulatory Visit: Payer: Self-pay | Admitting: Nurse Practitioner

## 2020-07-15 ENCOUNTER — Other Ambulatory Visit: Payer: Self-pay | Admitting: Nurse Practitioner

## 2020-07-15 DIAGNOSIS — F411 Generalized anxiety disorder: Secondary | ICD-10-CM

## 2020-07-22 ENCOUNTER — Other Ambulatory Visit: Payer: Self-pay | Admitting: Nurse Practitioner

## 2020-07-22 DIAGNOSIS — F411 Generalized anxiety disorder: Secondary | ICD-10-CM

## 2020-07-22 NOTE — Telephone Encounter (Signed)
Needs to be seen

## 2020-08-02 DIAGNOSIS — R6889 Other general symptoms and signs: Secondary | ICD-10-CM | POA: Diagnosis not present

## 2020-08-03 ENCOUNTER — Other Ambulatory Visit: Payer: Self-pay

## 2020-08-03 ENCOUNTER — Encounter: Payer: Self-pay | Admitting: Nurse Practitioner

## 2020-08-03 ENCOUNTER — Ambulatory Visit (INDEPENDENT_AMBULATORY_CARE_PROVIDER_SITE_OTHER): Payer: Medicare HMO | Admitting: Nurse Practitioner

## 2020-08-03 VITALS — BP 148/86 | HR 82 | Temp 98.3°F | Resp 20

## 2020-08-03 DIAGNOSIS — G114 Hereditary spastic paraplegia: Secondary | ICD-10-CM

## 2020-08-03 DIAGNOSIS — K581 Irritable bowel syndrome with constipation: Secondary | ICD-10-CM

## 2020-08-03 DIAGNOSIS — D509 Iron deficiency anemia, unspecified: Secondary | ICD-10-CM

## 2020-08-03 DIAGNOSIS — G43101 Migraine with aura, not intractable, with status migrainosus: Secondary | ICD-10-CM

## 2020-08-03 DIAGNOSIS — F17200 Nicotine dependence, unspecified, uncomplicated: Secondary | ICD-10-CM

## 2020-08-03 DIAGNOSIS — K2100 Gastro-esophageal reflux disease with esophagitis, without bleeding: Secondary | ICD-10-CM | POA: Diagnosis not present

## 2020-08-03 DIAGNOSIS — F411 Generalized anxiety disorder: Secondary | ICD-10-CM

## 2020-08-03 MED ORDER — BACLOFEN 10 MG PO TABS
10.0000 mg | ORAL_TABLET | Freq: Two times a day (BID) | ORAL | 3 refills | Status: DC
Start: 2020-08-03 — End: 2020-11-30

## 2020-08-03 MED ORDER — ALPRAZOLAM 0.5 MG PO TABS: 0.5000 mg | ORAL_TABLET | Freq: Two times a day (BID) | ORAL | 5 refills | Status: DC | PRN

## 2020-08-03 MED ORDER — DICLOFENAC SODIUM 1 % EX GEL: 4.0000 g | Freq: Four times a day (QID) | CUTANEOUS | 1 refills | Status: DC

## 2020-08-03 MED ORDER — TOPIRAMATE 50 MG PO TABS: ORAL_TABLET | ORAL | 2 refills | Status: DC

## 2020-08-03 MED ORDER — PANTOPRAZOLE SODIUM 40 MG PO TBEC: 40.0000 mg | DELAYED_RELEASE_TABLET | Freq: Two times a day (BID) | ORAL | 1 refills | Status: DC

## 2020-08-03 MED ORDER — ESCITALOPRAM OXALATE 10 MG PO TABS
10.0000 mg | ORAL_TABLET | Freq: Every day | ORAL | 1 refills | Status: DC
Start: 2020-08-03 — End: 2021-03-03

## 2020-08-03 NOTE — Addendum Note (Signed)
Addended by: Chevis Pretty on: 08/03/2020 04:24 PM   Modules accepted: Orders

## 2020-08-03 NOTE — Progress Notes (Addendum)
Subjective:    Patient ID: Tiffany Stewart, female    DOB: November 03, 1970, 49 y.o.   MRN: [id number]   Chief Complaint: Medical Management of Chronic Issues    HPI:  1. HSP (hereditary spastic paraplegia) ([employer]) Patient is mainly in a wheel chair most the time. She is able to stand up for short periods of time but is not able to walk very far.  2. Migraine with aura and with status migrainosus, not intractable Has 1-2 migraine a month. The topamax he;lps a lot. Tylenol helps when she has one.  3. Gastroesophageal reflux disease with esophagitis without hemorrhage Is on protonix dialy and is working well.  4. Irritable bowel syndrome with constipation Has had no recentt flare ups  5. Anxiety state Is on xanax and lexapro and is doing wel GAD 7 : Generalized Anxiety Score 08/03/2020 04/09/2020 09/26/2019 06/26/2019  Nervous, Anxious, on Edge 0 0 1 1  Control/stop worrying 0 0 2 1  Worry too much - different things 0 0 2 1  Trouble relaxing 0 0 1 1  Restless 0 0 0 0  Easily annoyed or irritable 0 0 1 1  Afraid - awful might happen 0 0 0 1  Total GAD 7 Score 0 0 7 6  Anxiety Difficulty Not difficult at all Not difficult at all Somewhat difficult Somewhat difficult    l.   6. Iron deficiency anemia, unspecified iron deficiency anemia type No c/o fatigue Lab Results  Component Value Date   HGB 14.7 06/26/2019     7. TOBACCO ABUSE Still smoking over a pack a day.    Outpatient Encounter Medications as of 08/03/2020  Medication Sig  . acetaminophen (TYLENOL) 500 MG tablet Take 1,500 mg by mouth 2 (two) times daily as needed for headache.   . ALPRAZolam (XANAX) 0.5 MG tablet TAKE 1 TABLET BY MOUTH TWICE DAILY AS NEEDED FOR ANXIETY.  . baclofen (LIORESAL) 10 MG tablet   . escitalopram (LEXAPRO) 10 MG tablet Take 1 tablet (10 mg total) by mouth daily.  . pantoprazole (PROTONIX) 40 MG tablet Take 1 tablet (40 mg total) by mouth 2 (two) times daily before a meal.  . topiramate  (TOPAMAX) 50 MG tablet TAKE (1) TABLET TWICE DAILY.   No facility-administered encounter medications on file as of 08/03/2020.    Past Surgical History:  Procedure Laterality Date  . CESAREAN SECTION    . CHOLECYSTECTOMY    . ENDOMETRIAL ABLATION  2000  . GASTRORRHAPHY  03/17/2018   Procedure: GASTRORRHAPHY;  Surgeon: Aviva Signs, MD;  Location: [facility];  Service: General;;  . HERNIA REPAIR    . LAPAROTOMY N/A 03/17/2018   Procedure: EXPLORATORY LAPAROTOMY;  Surgeon: Aviva Signs, MD;  Location: [facility];  Service: General;  Laterality: N/A;    Family History  Problem Relation Age of Onset  . Diabetes Father   . Cancer Father        lung cancer  . Heart disease Father   . Heart disease Maternal Grandmother   . Stroke Maternal Grandfather   . Diabetes Paternal Grandmother   . Colon cancer Neg Hx     New complaints: She is having left shoulder pain. voltaren gel use to work well. She thinks pain is coming from using her wheel chair.  Social history: Lives with her daughter  Controlled substance contract: 03/26/20    Review of Systems  Constitutional: Negative for diaphoresis.  Eyes: Negative for pain.  Respiratory: Negative for shortness of breath.  Cardiovascular: Negative for chest pain, palpitations and leg swelling.  Gastrointestinal: Negative for abdominal pain.  Endocrine: Negative for polydipsia.  Skin: Negative for rash.  Neurological: Negative for dizziness, weakness and headaches.  Hematological: Does not bruise/bleed easily.  All other systems reviewed and are negative.      Objective:   Physical Exam Vitals and nursing note reviewed.  Constitutional:      General: She is not in acute distress.    Appearance: Normal appearance. She is well-developed and well-nourished.  HENT:     Head: Normocephalic.     Nose: Nose normal.     Mouth/Throat:     Mouth: Oropharynx is clear and moist.  Eyes:     Extraocular Movements: EOM normal.     Pupils: Pupils  are equal, round, and reactive to light.  Neck:     Vascular: No carotid bruit or JVD.  Cardiovascular:     Rate and Rhythm: Normal rate and regular rhythm.     Pulses: Intact distal pulses.     Heart sounds: Normal heart sounds.  Pulmonary:     Effort: Pulmonary effort is normal. No respiratory distress.     Breath sounds: Normal breath sounds. No wheezing or rales.  Chest:     Chest wall: No tenderness.  Abdominal:     General: Bowel sounds are normal. There is no distension or abdominal bruit. Aorta is normal.     Palpations: Abdomen is soft. There is no hepatomegaly, splenomegaly, mass or pulsatile mass.     Tenderness: There is no abdominal tenderness.  Musculoskeletal:        General: No edema. Normal range of motion.     Cervical back: Normal range of motion and neck supple.     Comments: In wheel chair. Both feet are turning inward   Lymphadenopathy:     Cervical: No cervical adenopathy.  Skin:    General: Skin is warm and dry.  Neurological:     Mental Status: She is alert and oriented to person, place, and time.     Deep Tendon Reflexes: Reflexes are normal and symmetric.  Psychiatric:        Mood and Affect: Mood and affect normal.        Behavior: Behavior normal.        Thought Content: Thought content normal.        Judgment: Judgment normal.    BP (!) 148/86   Pulse 82   Temp 98.3 F (36.8 C) (Temporal)   Resp 20   SpO2 99%          Assessment & Plan:  Tiffany Stewart comes in today with chief complaint of Medical Management of Chronic Issues   Diagnosis and orders addressed:  1. HSP (hereditary spastic paraplegia) ([place]) Going to find out where she can get some braces for lower legs to help prevent rotating inward.  2. Migraine with aura and with status migrainosus, not intractable rest - topiramate (TOPAMAX) 50 MG tablet; TAKE (1) TABLET TWICE DAILY.  Dispense: 60 tablet; Refill: 2  3. Gastroesophageal reflux disease with esophagitis without  hemorrhage Avoid spicy foods Do not eat 2 hours prior to bedtime - pantoprazole (PROTONIX) 40 MG tablet; Take 1 tablet (40 mg total) by mouth 2 (two) times daily before a meal.  Dispense: 180 tablet; Refill: 1  4. Irritable bowel syndrome with constipation Watch diet to help prevent flare ups  5. Anxiety state stress management - ALPRAZolam (XANAX) 0.5 MG tablet; Take  1 tablet (0.5 mg total) by mouth 2 (two) times daily as needed. for anxiety  Dispense: 60 tablet; Refill: 5 - escitalopram (LEXAPRO) 10 MG tablet; Take 1 tablet (10 mg total) by mouth daily.  Dispense: 90 tablet; Refill: 1  6. Iron deficiency anemia, unspecified iron deficiency anemia type Labs pending  7. TOBACCO ABUSE Smoking cessation encouraged  8. Left shoulder pain voltaren gel BID Ice Rest  9. Inversion of bil feet * needs orthothics for bil lower ext  Labs pending Health Maintenance reviewed Diet and exercise encouraged  Follow up plan: 6 months   [place], FNP

## 2020-08-28 ENCOUNTER — Observation Stay (HOSPITAL_COMMUNITY)
Admission: EM | Admit: 2020-08-28 | Discharge: 2020-09-01 | Disposition: A | Discharge status: Home / Self Care | Payer: Medicare HMO | Attending: Family Medicine | Admitting: Family Medicine

## 2020-08-28 ENCOUNTER — Encounter (HOSPITAL_COMMUNITY): Payer: Self-pay | Admitting: Emergency Medicine

## 2020-08-28 ENCOUNTER — Other Ambulatory Visit: Payer: Self-pay

## 2020-08-28 DIAGNOSIS — I6522 Occlusion and stenosis of left carotid artery: Secondary | ICD-10-CM | POA: Diagnosis not present

## 2020-08-28 DIAGNOSIS — Z993 Dependence on wheelchair: Secondary | ICD-10-CM | POA: Diagnosis not present

## 2020-08-28 DIAGNOSIS — R296 Repeated falls: Secondary | ICD-10-CM

## 2020-08-28 DIAGNOSIS — G114 Hereditary spastic paraplegia: Secondary | ICD-10-CM | POA: Diagnosis not present

## 2020-08-28 DIAGNOSIS — R400 Somnolence: Secondary | ICD-10-CM

## 2020-08-28 DIAGNOSIS — M6281 Muscle weakness (generalized): Secondary | ICD-10-CM | POA: Diagnosis not present

## 2020-08-28 DIAGNOSIS — Z20822 Contact with and (suspected) exposure to covid-19: Secondary | ICD-10-CM | POA: Diagnosis not present

## 2020-08-28 DIAGNOSIS — R404 Transient alteration of awareness: Secondary | ICD-10-CM | POA: Diagnosis not present

## 2020-08-28 DIAGNOSIS — F29 Unspecified psychosis not due to a substance or known physiological condition: Secondary | ICD-10-CM | POA: Diagnosis not present

## 2020-08-28 DIAGNOSIS — R9431 Abnormal electrocardiogram [ECG] [EKG]: Secondary | ICD-10-CM | POA: Diagnosis not present

## 2020-08-28 DIAGNOSIS — Z885 Allergy status to narcotic agent status: Secondary | ICD-10-CM | POA: Insufficient documentation

## 2020-08-28 DIAGNOSIS — I959 Hypotension, unspecified: Secondary | ICD-10-CM | POA: Diagnosis not present

## 2020-08-28 DIAGNOSIS — F17200 Nicotine dependence, unspecified, uncomplicated: Secondary | ICD-10-CM | POA: Diagnosis present

## 2020-08-28 DIAGNOSIS — Z743 Need for continuous supervision: Secondary | ICD-10-CM | POA: Diagnosis not present

## 2020-08-28 DIAGNOSIS — F17210 Nicotine dependence, cigarettes, uncomplicated: Secondary | ICD-10-CM | POA: Diagnosis not present

## 2020-08-28 DIAGNOSIS — R4781 Slurred speech: Secondary | ICD-10-CM | POA: Diagnosis not present

## 2020-08-28 DIAGNOSIS — Y92012 Bathroom of single-family (private) house as the place of occurrence of the external cause: Secondary | ICD-10-CM | POA: Diagnosis not present

## 2020-08-28 DIAGNOSIS — G8929 Other chronic pain: Secondary | ICD-10-CM | POA: Insufficient documentation

## 2020-08-28 DIAGNOSIS — K219 Gastro-esophageal reflux disease without esophagitis: Secondary | ICD-10-CM | POA: Insufficient documentation

## 2020-08-28 DIAGNOSIS — R6889 Other general symptoms and signs: Secondary | ICD-10-CM | POA: Diagnosis not present

## 2020-08-28 DIAGNOSIS — W19XXXA Unspecified fall, initial encounter: Secondary | ICD-10-CM | POA: Diagnosis not present

## 2020-08-28 DIAGNOSIS — R4182 Altered mental status, unspecified: Secondary | ICD-10-CM | POA: Diagnosis not present

## 2020-08-28 DIAGNOSIS — F411 Generalized anxiety disorder: Secondary | ICD-10-CM | POA: Insufficient documentation

## 2020-08-28 DIAGNOSIS — R55 Syncope and collapse: Secondary | ICD-10-CM

## 2020-08-28 DIAGNOSIS — Z79899 Other long term (current) drug therapy: Secondary | ICD-10-CM | POA: Diagnosis not present

## 2020-08-28 DIAGNOSIS — G9340 Encephalopathy, unspecified: Secondary | ICD-10-CM | POA: Diagnosis not present

## 2020-08-28 MED ORDER — SODIUM CHLORIDE 0.9 % IV BOLUS
500.0000 mL | Freq: Once | INTRAVENOUS | Status: AC
  Administered 2020-08-29: 500 mL via INTRAVENOUS

## 2020-08-28 NOTE — ED Triage Notes (Addendum)
[facility] - pt brought in after daughter found her and pt was altered. LKW "sometime this afternoon" cbg 131, BP 75/39.

## 2020-08-28 NOTE — ED Provider Notes (Signed)
[facility] EMERGENCY DEPARTMENT Provider Note   CSN: [id number] Arrival date & time: 08/28/20  2344   Time seen 11:47 PM  History Chief Complaint  Patient presents with  . Altered Mental Status   Level 5 caveat for altered mental status  Tiffany Stewart is a 50 y.o. female.  HPI   EMS was called when daughter came home and found the patient laying on the bathroom floor underneath her wheelchair with urinary incontinence.  She got her up and sat her on the toilet and said she kept repeating the same thing over and over again.  She also told her daughter she had taken a nerve pill.  When EMS got there she was still sitting on the toilet.  She was talking as they started getting her ready to put in the ambulance she started being less responsive.  When she arrived in the ED she would answer questions but they were always the right response.  EMS reports they got a blood pressure of 75/39 with CBG of 131.  PCP Chevis Pretty, FNP   Past Medical History:  Diagnosis Date  . Anxiety   . Chronic pain   . Depression   . GERD (gastroesophageal reflux disease)   . HSP (hereditary spastic paraplegia) ([place])   . Migraines   . Perforated peptic ulcer ([place]) 03/17/2018   Goody powder overuse    Patient Active Problem List   Diagnosis Date Noted  . Perforated peptic ulcer ([place]) 03/17/2018  . Irritable bowel syndrome 06/04/2017  . Migraine with aura and with status migrainosus, not intractable 02/09/2017  . Baclofen overdose 02/09/2017  . Protein-calorie malnutrition, severe 11/15/2016  . Iron deficiency anemia 11/13/2016  . HSP (hereditary spastic paraplegia) ([place]) 04/27/2014  . TOBACCO ABUSE 11/11/2009  . Anxiety state 03/06/2008  . GERD 03/06/2008    Past Surgical History:  Procedure Laterality Date  . CESAREAN SECTION    . CHOLECYSTECTOMY    . ENDOMETRIAL ABLATION  2000  . GASTRORRHAPHY  03/17/2018   Procedure: GASTRORRHAPHY;  Surgeon: Aviva Signs, MD;  Location: [facility];   Service: General;;  . HERNIA REPAIR    . LAPAROTOMY N/A 03/17/2018   Procedure: EXPLORATORY LAPAROTOMY;  Surgeon: Aviva Signs, MD;  Location: [facility];  Service: General;  Laterality: N/A;     OB History   No obstetric history on file.     Family History  Problem Relation Age of Onset  . Diabetes Father   . Cancer Father        lung cancer  . Heart disease Father   . Heart disease Maternal Grandmother   . Stroke Maternal Grandfather   . Diabetes Paternal Grandmother   . Colon cancer Neg Hx     Social History   Tobacco Use  . Smoking status: Current Every Day Smoker    Packs/day: 0.50    Years: 27.00    Pack years: 13.50    Types: Cigarettes  . Smokeless tobacco: Never Used  Vaping Use  . Vaping Use: Never used  Substance Use Topics  . Alcohol use: No  . Drug use: No  Uses a wheelchair  Home Medications Prior to Admission medications   Medication Sig Start Date End Date Taking? Authorizing Provider  acetaminophen (TYLENOL) 500 MG tablet Take 1,500 mg by mouth 2 (two) times daily as needed for headache.     [provider]  ALPRAZolam Duanne Moron) 0.5 MG tablet Take 1 tablet (0.5 mg total) by mouth 2 (two) times daily  as needed. for anxiety 08/03/20   Chevis Pretty, FNP  baclofen (LIORESAL) 10 MG tablet Take 1 tablet (10 mg total) by mouth 2 (two) times daily. 08/03/20   Hassell Done, Mary-Margaret, FNP  diclofenac Sodium (VOLTAREN) 1 % GEL Apply 4 g topically 4 (four) times daily. 08/03/20   Hassell Done, Mary-Margaret, FNP  escitalopram (LEXAPRO) 10 MG tablet Take 1 tablet (10 mg total) by mouth daily. 08/03/20   Hassell Done, Mary-Margaret, FNP  pantoprazole (PROTONIX) 40 MG tablet Take 1 tablet (40 mg total) by mouth 2 (two) times daily before a meal. 08/03/20   Hassell Done, Mary-Margaret, FNP  topiramate (TOPAMAX) 50 MG tablet TAKE (1) TABLET TWICE DAILY. 08/03/20   Chevis Pretty, FNP    Allergies    Hctz [hydrochlorothiazide], Morphine, and Zoloft [sertraline  hcl]  Review of Systems   Review of Systems  Unable to perform ROS: Mental status change    Physical Exam Updated Vital Signs ED Triage Vitals  Enc Vitals Group     BP 08/28/20 2351 (!) 141/69     Pulse Rate 08/28/20 2351 69     Resp 08/28/20 2351 18     Temp 08/28/20 2351 97.8 F (36.6 C)     Temp Source 08/28/20 2351 Rectal     SpO2 08/28/20 2351 100 %     Weight 08/28/20 2347 117 lb 15.1 oz (53.5 kg)     Height 08/28/20 2347 5' (1.524 m)     Head Circumference --      Peak Flow --      Pain Score 08/28/20 2347 0     Pain Loc --      Pain Edu? --      Excl. in [place]? --      Physical Exam Vitals and nursing note reviewed.  Constitutional:      Appearance: Normal appearance. She is obese.  HENT:     Head: Normocephalic and atraumatic.     Right Ear: External ear normal.     Left Ear: External ear normal.  Eyes:     Extraocular Movements: Extraocular movements intact.     Conjunctiva/sclera: Conjunctivae normal.     Pupils: Pupils are equal, round, and reactive to light.  Cardiovascular:     Rate and Rhythm: Normal rate and regular rhythm.     Pulses: Normal pulses.     Heart sounds: Normal heart sounds.  Pulmonary:     Effort: Pulmonary effort is normal. No respiratory distress.     Breath sounds: Normal breath sounds.  Abdominal:     General: Bowel sounds are normal.     Palpations: Abdomen is soft.     Tenderness: There is no abdominal tenderness.  Musculoskeletal:     Cervical back: Normal range of motion.     Right lower leg: No edema.     Left lower leg: No edema.  Skin:    General: Skin is warm and dry.  Neurological:     Comments: Patient's right upper extremity is very rigid when I try to move it.  When I move her left arm she avoids hitting herself.  She does not follow commands.  She does answer when asked her birthdate but she states "[birth year]".  Psychiatric:     Comments: Unable to assess     ED Results / Procedures / Treatments    Labs (all labs ordered are listed, but only abnormal results are displayed) Results for orders placed or performed during the hospital encounter of 08/28/20  Ethanol  Result Value Ref Range   Alcohol, Ethyl (B) <10 <10 mg/dL  Protime-INR  Result Value Ref Range   Prothrombin Time 12.6 11.4 - 15.2 seconds   INR 1.0 0.8 - 1.2  APTT  Result Value Ref Range   aPTT 27 24 - 36 seconds  CBC  Result Value Ref Range   WBC 8.4 4.0 - 10.5 K/uL   RBC 4.41 3.87 - 5.11 MIL/uL   Hemoglobin 14.4 12.0 - 15.0 g/dL   HCT 44.3 36.0 - 46.0 %   MCV 100.5 (H) 80.0 - 100.0 fL   MCH 32.7 26.0 - 34.0 pg   MCHC 32.5 30.0 - 36.0 g/dL   RDW 12.4 11.5 - 15.5 %   Platelets 284 150 - 400 K/uL   nRBC 0.0 0.0 - 0.2 %  Differential  Result Value Ref Range   Neutrophils Relative % 67 %   Neutro Abs 5.7 1.7 - 7.7 K/uL   Lymphocytes Relative 27 %   Lymphs Abs 2.3 0.7 - 4.0 K/uL   Monocytes Relative 4 %   Monocytes Absolute 0.4 0.1 - 1.0 K/uL   Eosinophils Relative 0 %   Eosinophils Absolute 0.0 0.0 - 0.5 K/uL   Basophils Relative 1 %   Basophils Absolute 0.0 0.0 - 0.1 K/uL   Immature Granulocytes 1 %   Abs Immature Granulocytes 0.04 0.00 - 0.07 K/uL  Comprehensive metabolic panel  Result Value Ref Range   Sodium 143 135 - 145 mmol/L   Potassium 4.1 3.5 - 5.1 mmol/L   Chloride 110 98 - 111 mmol/L   CO2 26 22 - 32 mmol/L   Glucose, Bld 112 (H) 70 - 99 mg/dL   BUN 19 6 - 20 mg/dL   Creatinine, Ser 0.69 0.44 - 1.00 mg/dL   Calcium 9.5 8.9 - 10.3 mg/dL   Total Protein 8.1 6.5 - 8.1 g/dL   Albumin 4.4 3.5 - 5.0 g/dL   AST 16 15 - 41 U/L   ALT 15 0 - 44 U/L   Alkaline Phosphatase 64 38 - 126 U/L   Total Bilirubin 0.3 0.3 - 1.2 mg/dL   GFR, Estimated >60 >60 mL/min   Anion gap 7 5 - 15  Urine rapid drug screen (hosp performed)  Result Value Ref Range   Opiates NONE DETECTED NONE DETECTED   Cocaine NONE DETECTED NONE DETECTED   Benzodiazepines POSITIVE (A) NONE DETECTED   Amphetamines NONE DETECTED  NONE DETECTED   Tetrahydrocannabinol NONE DETECTED NONE DETECTED   Barbiturates NONE DETECTED NONE DETECTED  Urinalysis, Routine w reflex microscopic Urine, Clean Catch  Result Value Ref Range   Color, Urine YELLOW YELLOW   APPearance HAZY (A) CLEAR   Specific Gravity, Urine 1.016 1.005 - 1.030   pH 8.0 5.0 - 8.0   Glucose, UA NEGATIVE NEGATIVE mg/dL   Hgb urine dipstick NEGATIVE NEGATIVE   Bilirubin Urine NEGATIVE NEGATIVE   Ketones, ur NEGATIVE NEGATIVE mg/dL   Protein, ur NEGATIVE NEGATIVE mg/dL   Nitrite NEGATIVE NEGATIVE   Leukocytes,Ua NEGATIVE NEGATIVE   RBC / HPF 0-5 0 - 5 RBC/hpf   WBC, UA 0-5 0 - 5 WBC/hpf   Bacteria, UA NONE SEEN NONE SEEN   Squamous Epithelial / LPF 0-5 0 - 5   Budding Yeast NONE SEEN    Crystals NONE (A) NEGATIVE  POC urine preg, ED  Result Value Ref Range   Preg Test, Ur NEGATIVE NEGATIVE  CBG monitoring, ED  Result Value Ref Range   Glucose-Capillary 98  70 - 99 mg/dL   Laboratory interpretation all normal except positive UDS however patient is prescribed alprazolam    EKG EKG Interpretation  Date/Time:  Saturday August 28 2020 23:47:31 EST Ventricular Rate:  74 PR Interval:    QRS Duration: 96 QT Interval:  418 QTC Calculation: 464 R Axis:   35 Text Interpretation: Sinus rhythm Nonspecific T abnormalities, anterior leads Electrode noise Since last tracing rate slower 05 Apr 2018 Confirmed by Rolland Porter [phone]) on 08/28/2020 11:54:45 PM   Radiology CT Angio Head W or Wo Contrast CT Angio Neck W and/or Wo Contrast  Result Date: 08/29/2020 CLINICAL DATA:  Encephalopathy EXAM: CT ANGIOGRAPHY HEAD AND NECK TECHNIQUE: Multidetector CT imaging of the head and neck was performed using the standard protocol during bolus administration of intravenous contrast. Multiplanar CT image reconstructions and MIPs were obtained to evaluate the vascular anatomy. Carotid stenosis measurements (when applicable) are obtained utilizing NASCET criteria, using  the distal internal carotid diameter as the denominator. CONTRAST:  3mL OMNIPAQUE IOHEXOL 350 MG/ML SOLN COMPARISON:  None. FINDINGS: CTA NECK FINDINGS SKELETON: There is no bony spinal canal stenosis. No lytic or blastic lesion. OTHER NECK: Normal pharynx, larynx and major salivary glands. No cervical lymphadenopathy. Unremarkable thyroid gland. UPPER CHEST: No pneumothorax or pleural effusion. No nodules or masses. AORTIC ARCH: There is no calcific atherosclerosis of the aortic arch. There is no aneurysm, dissection or hemodynamically significant stenosis of the visualized portion of the aorta. Conventional 3 vessel aortic branching pattern. The visualized proximal subclavian arteries are widely patent. RIGHT CAROTID SYSTEM: Normal without aneurysm, dissection or stenosis. LEFT CAROTID SYSTEM: Normal without aneurysm, dissection or stenosis. VERTEBRAL ARTERIES: Left dominant configuration. Both origins are clearly patent. There is no dissection, occlusion or flow-limiting stenosis to the skull base (V1-V3 segments). CTA HEAD FINDINGS POSTERIOR CIRCULATION: --Vertebral arteries: Normal V4 segments. --Inferior cerebellar arteries: Normal. --Basilar artery: Normal. --Superior cerebellar arteries: Normal. --Posterior cerebral arteries (PCA): Normal. ANTERIOR CIRCULATION: --Intracranial internal carotid arteries: Normal. --Anterior cerebral arteries (ACA): Normal. Both A1 segments are present. Patent anterior communicating artery (a-comm). --Middle cerebral arteries (MCA): Normal. VENOUS SINUSES: As permitted by contrast timing, patent. ANATOMIC VARIANTS: None Review of the MIP images confirms the above findings. IMPRESSION: Normal CTA of the head and neck. Electronically Signed   By: Ulyses Jarred M.D.   On: 08/29/2020 03:30   CT HEAD WO CONTRAST  Result Date: 08/29/2020 CLINICAL DATA:  Mental status change EXAM: CT HEAD WITHOUT CONTRAST TECHNIQUE: Contiguous axial images were obtained from the base of the skull  through the vertex without intravenous contrast. COMPARISON:  None. FINDINGS: Brain: No evidence of acute territorial infarction, hemorrhage, hydrocephalus,extra-axial collection or mass lesion/mass effect. Normal gray-white differentiation. Ventricles are normal in size and contour. Vascular: No hyperdense vessel or unexpected calcification. Skull: The skull is intact. No fracture or focal lesion identified. Sinuses/Orbits: The visualized paranasal sinuses and mastoid air cells are clear. The orbits and globes intact. Other: None IMPRESSION: No acute intracranial abnormality. Electronically Signed   By: Prudencio Pair M.D.   On: 08/29/2020 01:14        Procedures .Critical Care Performed by: Rolland Porter, MD Authorized by: Rolland Porter, MD   Critical care provider statement:    Critical care time (minutes):  34   Critical care was necessary to treat or prevent imminent or life-threatening deterioration of the following conditions:  CNS failure or compromise   Critical care was time spent personally by me on the following activities:  Discussions with  consultants, examination of patient, obtaining history from patient or surrogate, ordering and review of laboratory studies, ordering and review of radiographic studies, pulse oximetry, re-evaluation of patient's condition and review of old charts   Care discussed with: admitting provider     (including critical care time)  Medications Ordered in ED Medications  sodium chloride 0.9 % bolus 500 mL (0 mLs Intravenous Stopped 08/29/20 0100)  iohexol (OMNIPAQUE) 350 MG/ML injection 75 mL (75 mLs Intravenous Contrast Given 08/29/20 0321)    ED Course  I have reviewed the triage vital signs and the nursing notes.  Pertinent labs & imaging results that were available during my care of the patient were reviewed by me and considered in my medical decision making (see chart for details).    MDM Rules/Calculators/A&P                          Code stroke  orders were initiated without calling code stroke.  I am not sure whether patient is under influence of some medication such as her "nerve pill" or if she has had some type of intracranial event such as a intracranial bleed or stroke.  Recheck at 12:18 AM patient is resting, when I say her name she opens her eyes.  Blood pressure is 167/89, heart rate 67, pulse ox 100% on room air.  Review of the [place] shows patient gets #60 alprazolam 0.5 mg monthly, last filled December 21 for a 30-day supply.  Recheck at 2:00 AM patient is much more awake, she opens her eyes to verbal stimulus and makes eye contact.  Her speech is still a little garbled.  Recheck at 3:45 AM patient is awake, daughter is in the room.  She told me she is at [facility], she can tell me her birthdate.  She admits to me she took 2 of her alprazolam however she told the nurse she took 3.  Daughter is reluctant to take her home.  I told her at this point it would be hard for me to convince the hospitalist to admit patient.  I will do teleneurology consult for second opinion.  4:10 AM Barnett Applebaum, nurse manager of telemetry specialist took reason for consult and will have a neurologist evaluate patient shortly.  04:38 AM patient was evaluated by the telemetry neurologist, Dr Bridgett Larsson,  he felt that she could have had a unwitnessed seizure.  He recommends admission and MRI and EEG as part of her evaluation.  4:55 AM Dr. Josephine Cables, hospitalist will admit.  Final Clinical Impression(s) / ED Diagnoses Final diagnoses:  Somnolence    Rx / DC Orders  Plan admission  Rolland Porter, MD, Barbette Or, MD 08/29/20 [phone]

## 2020-08-29 ENCOUNTER — Observation Stay (HOSPITAL_BASED_OUTPATIENT_CLINIC_OR_DEPARTMENT_OTHER): Payer: Medicare HMO

## 2020-08-29 ENCOUNTER — Emergency Department (HOSPITAL_COMMUNITY): Payer: Medicare HMO

## 2020-08-29 ENCOUNTER — Encounter (HOSPITAL_COMMUNITY): Payer: Self-pay | Admitting: Internal Medicine

## 2020-08-29 ENCOUNTER — Other Ambulatory Visit: Payer: Self-pay

## 2020-08-29 ENCOUNTER — Observation Stay (HOSPITAL_COMMUNITY): Payer: Medicare HMO

## 2020-08-29 DIAGNOSIS — K219 Gastro-esophageal reflux disease without esophagitis: Secondary | ICD-10-CM

## 2020-08-29 DIAGNOSIS — F17200 Nicotine dependence, unspecified, uncomplicated: Secondary | ICD-10-CM

## 2020-08-29 DIAGNOSIS — F411 Generalized anxiety disorder: Secondary | ICD-10-CM

## 2020-08-29 DIAGNOSIS — R55 Syncope and collapse: Secondary | ICD-10-CM

## 2020-08-29 DIAGNOSIS — R4182 Altered mental status, unspecified: Secondary | ICD-10-CM | POA: Diagnosis present

## 2020-08-29 DIAGNOSIS — R296 Repeated falls: Secondary | ICD-10-CM

## 2020-08-29 DIAGNOSIS — Z79899 Other long term (current) drug therapy: Secondary | ICD-10-CM

## 2020-08-29 DIAGNOSIS — G114 Hereditary spastic paraplegia: Secondary | ICD-10-CM

## 2020-08-29 DIAGNOSIS — I6522 Occlusion and stenosis of left carotid artery: Secondary | ICD-10-CM | POA: Diagnosis not present

## 2020-08-29 DIAGNOSIS — G9340 Encephalopathy, unspecified: Secondary | ICD-10-CM | POA: Diagnosis not present

## 2020-08-29 LAB — CBC
HCT: 44.3 % (ref 36.0–46.0)
Hemoglobin: 14.4 g/dL (ref 12.0–15.0)
MCH: 32.7 pg (ref 26.0–34.0)
MCHC: 32.5 g/dL (ref 30.0–36.0)
MCV: 100.5 fL — ABNORMAL HIGH (ref 80.0–100.0)
Platelets: 284 10*3/uL (ref 150–400)
RBC: 4.41 MIL/uL (ref 3.87–5.11)
RDW: 12.4 % (ref 11.5–15.5)
WBC: 8.4 10*3/uL (ref 4.0–10.5)
nRBC: 0 % (ref 0.0–0.2)

## 2020-08-29 LAB — RESP PANEL BY RT-PCR (FLU A&B, COVID) ARPGX2
Influenza A by PCR: NEGATIVE
Influenza B by PCR: NEGATIVE
SARS Coronavirus 2 by RT PCR: NEGATIVE

## 2020-08-29 LAB — COMPREHENSIVE METABOLIC PANEL
ALT: 15 U/L (ref 0–44)
AST: 16 U/L (ref 15–41)
Albumin: 4.4 g/dL (ref 3.5–5.0)
Alkaline Phosphatase: 64 U/L (ref 38–126)
Anion gap: 7 (ref 5–15)
BUN: 19 mg/dL (ref 6–20)
CO2: 26 mmol/L (ref 22–32)
Calcium: 9.5 mg/dL (ref 8.9–10.3)
Chloride: 110 mmol/L (ref 98–111)
Creatinine, Ser: 0.69 mg/dL (ref 0.44–1.00)
GFR, Estimated: >60 mL/min (ref >60)
Glucose, Bld: 112 mg/dL — ABNORMAL HIGH (ref 70–99)
Potassium: 4.1 mmol/L (ref 3.5–5.1)
Sodium: 143 mmol/L (ref 135–145)
Total Bilirubin: 0.3 mg/dL (ref 0.3–1.2)
Total Protein: 8.1 g/dL (ref 6.5–8.1)

## 2020-08-29 LAB — ECHOCARDIOGRAM COMPLETE
Area-P 1/2: 3.81 cm2
Height: 60 in
S' Lateral: 2.68 cm
Weight: 1887.14 oz

## 2020-08-29 LAB — DIFFERENTIAL
Abs Immature Granulocytes: 0.04 10*3/uL (ref 0.00–0.07)
Basophils Absolute: 0 10*3/uL (ref 0.0–0.1)
Basophils Relative: 1 %
Eosinophils Absolute: 0 10*3/uL (ref 0.0–0.5)
Eosinophils Relative: 0 %
Immature Granulocytes: 1 %
Lymphocytes Relative: 27 %
Lymphs Abs: 2.3 10*3/uL (ref 0.7–4.0)
Monocytes Absolute: 0.4 10*3/uL (ref 0.1–1.0)
Monocytes Relative: 4 %
Neutro Abs: 5.7 10*3/uL (ref 1.7–7.7)
Neutrophils Relative %: 67 %

## 2020-08-29 LAB — URINALYSIS, ROUTINE W REFLEX MICROSCOPIC
Bacteria, UA: NONE SEEN
Bilirubin Urine: NEGATIVE
Budding Yeast: NONE SEEN
Glucose, UA: NEGATIVE mg/dL
Hgb urine dipstick: NEGATIVE
Ketones, ur: NEGATIVE mg/dL
Leukocytes,Ua: NEGATIVE
Nitrite: NEGATIVE
Protein, ur: NEGATIVE mg/dL
Specific Gravity, Urine: 1.016 (ref 1.005–1.030)
pH: 8 (ref 5.0–8.0)

## 2020-08-29 LAB — PROTIME-INR
INR: 1 (ref 0.8–1.2)
Prothrombin Time: 12.6 seconds (ref 11.4–15.2)

## 2020-08-29 LAB — CBG MONITORING, ED: Glucose-Capillary: 98 mg/dL (ref 70–99)

## 2020-08-29 LAB — RAPID URINE DRUG SCREEN, HOSP PERFORMED
Amphetamines: NOT DETECTED
Barbiturates: NOT DETECTED
Benzodiazepines: POSITIVE — AB
Cocaine: NOT DETECTED
Opiates: NOT DETECTED
Tetrahydrocannabinol: NOT DETECTED

## 2020-08-29 LAB — ETHANOL: Alcohol, Ethyl (B): <10 mg/dL (ref <10)

## 2020-08-29 LAB — POC URINE PREG, ED: Preg Test, Ur: NEGATIVE

## 2020-08-29 LAB — APTT: aPTT: 27 seconds (ref 24–36)

## 2020-08-29 MED ORDER — KETOROLAC TROMETHAMINE 30 MG/ML IJ SOLN: 30.0000 mg | Freq: Once | INTRAMUSCULAR | Status: DC

## 2020-08-29 MED ORDER — ACETAMINOPHEN 325 MG PO TABS
650.0000 mg | ORAL_TABLET | Freq: Four times a day (QID) | ORAL | Status: DC | PRN
  Administered 2020-08-29 – 2020-09-01 (×7): 650 mg via ORAL
  Filled 2020-08-29 (×7): qty 2

## 2020-08-29 MED ORDER — PANTOPRAZOLE SODIUM 40 MG PO TBEC
40.0000 mg | DELAYED_RELEASE_TABLET | Freq: Two times a day (BID) | ORAL | Status: DC
  Administered 2020-08-29 – 2020-09-01 (×6): 40 mg via ORAL
  Filled 2020-08-29 (×6): qty 1

## 2020-08-29 MED ORDER — IOHEXOL 350 MG/ML SOLN
75.0000 mL | Freq: Once | INTRAVENOUS | Status: AC | PRN
  Administered 2020-08-29: 75 mL via INTRAVENOUS

## 2020-08-29 MED ORDER — ALPRAZOLAM 0.5 MG PO TABS: 0.5000 mg | ORAL_TABLET | Freq: Two times a day (BID) | ORAL | Status: DC | PRN

## 2020-08-29 MED ORDER — BACLOFEN 10 MG PO TABS
10.0000 mg | ORAL_TABLET | Freq: Three times a day (TID) | ORAL | Status: DC
Start: 2020-08-29 — End: 2020-09-01
  Administered 2020-08-29 – 2020-09-01 (×10): 10 mg via ORAL
  Filled 2020-08-29 (×10): qty 1

## 2020-08-29 MED ORDER — TOPIRAMATE 25 MG PO TABS
50.0000 mg | ORAL_TABLET | Freq: Two times a day (BID) | ORAL | Status: DC
Start: 2020-08-29 — End: 2020-09-01
  Administered 2020-08-29 – 2020-09-01 (×7): 50 mg via ORAL
  Filled 2020-08-29 (×7): qty 2

## 2020-08-29 MED ORDER — ESCITALOPRAM OXALATE 10 MG PO TABS
10.0000 mg | ORAL_TABLET | Freq: Every day | ORAL | Status: DC
  Administered 2020-08-29 – 2020-09-01 (×4): 10 mg via ORAL
  Filled 2020-08-29 (×5): qty 1

## 2020-08-29 NOTE — Evaluation (Signed)
Physical Therapy Evaluation Patient Details Name: Tiffany Stewart MRN: [id number] DOB: 06-17-1971 Today's Date: 08/29/2020   History of Present Illness  Tiffany Stewart is a 50 y.o. female with medical history significant for paraplegia, anxiety, depression, migraine and chronic pain who presents to the ED via EMS due to fall and confusion.  Patient was unable to provide history of why she came to the ED, history was obtained from ED physician and daughter at bedside.  Per daughter, patient lives with one of the daughters who was not at home when she sustained a fall.  Apparently, patient complained of pain in her legs, so she took 2 tablets of 0.5 mg of Xanax (instead of normal dose of 1 tablet), she is wheelchair-bound and went to use the restroom, she possibly sustained a fall in the toilet, on arrival of the daughter (within 1-1.5 hours of onset of fall), patient was noted to be underneath her wheelchair with urinary incontinence, she was helped to sit on the toilet, but patient continued to repeat herself over and over again (different from her baseline of functioning), so EMS was activated and on arrival of EMS team, CBG check was 131, but BP was low as 75/39.  Patient was taken to the ED for further evaluation.    Clinical Impression  Patient functioning near baseline for functional mobility and transfers, has most difficulty due to w/c has to be held during transfers secondary to unsteady, patient demonstrates slow labored movement during transfers with extensive use of BUE to maintain balance during stand pivot phase, had difficulty transferring to commode in bathroom due to limited space, but patient states set up much easier at home.  Patient tolerated staying up in w/c after therapy with nursing staff present in room.  Patient will benefit from continued physical therapy in hospital and recommended venue below to increase strength, balance, endurance for safe ADLs and transfers.    Follow Up  Recommendations Home health PT;Supervision for mobility/OOB;Supervision - Intermittent    Equipment Recommendations  None recommended by PT    Recommendations for Other Services       Precautions / Restrictions Precautions Precautions: Fall Restrictions Weight Bearing Restrictions: No      Mobility  Bed Mobility Overal bed mobility: Needs Assistance Bed Mobility: Supine to Sit     Supine to sit: Modified independent (Device/Increase time);Supervision;HOB elevated     General bed mobility comments: increased time, labored movement    Transfers Overall transfer level: Needs assistance Equipment used:  (using wheelchair) Transfers: Sit to/from [facility] Sit to Stand: Min guard;Min assist Stand pivot transfers: Min guard;Min assist       General transfer comment: demonstrates slow labored movement for bed<>w/c<>commode<>chair transfers, required w/c held steady during transfers, able to complete most on her own except in bathroom due to lack of space  Ambulation/Gait                Stairs            Wheelchair Mobility    Modified Rankin (Stroke Patients Only)       Balance Overall balance assessment: Needs assistance Sitting-balance support: Feet unsupported;Bilateral upper extremity supported Sitting balance-Leahy Scale: Fair Sitting balance - Comments: fair/good seated at EOB and in chair                                     Pertinent Vitals/Pain Pain Assessment:  Faces Faces Pain Scale: Hurts little more Pain Location: chronic BLE pain Pain Descriptors / Indicators: Sore;Aching Pain Intervention(s): Limited activity within patient's tolerance;Monitored during session;Repositioned    Home Living Family/patient expects to be discharged to:: Private residence Living Arrangements: Children Available Help at Discharge: Family;Available PRN/intermittently Type of Home: House Home Access: Stairs to  enter Entrance Stairs-Rails: Right;Can reach both;Left Entrance Stairs-Number of Steps: 3 Home Layout: One level Home Equipment: Wheelchair - manual;Hospital bed;Shower seat;Bedside commode;Toilet riser      Prior Function Level of Independence: Needs assistance   Gait / Transfers Assistance Needed: non-ambulatory, uses w/c for mobilty, Mod Indep bed<>w/c<>commode transfers  ADL's / Homemaking Assistance Needed: assisted by family        Hand Dominance        Extremity/Trunk Assessment   Upper Extremity Assessment Upper Extremity Assessment: Overall WFL for tasks assessed    Lower Extremity Assessment Lower Extremity Assessment: Generalized weakness (presents with deformities and quadriceps contractures)    Cervical / Trunk Assessment Cervical / Trunk Assessment: Normal  Communication   Communication: No difficulties  Cognition Arousal/Alertness: Awake/alert Behavior During Therapy: WFL for tasks assessed/performed Overall Cognitive Status: No family/caregiver present to determine baseline cognitive functioning                                        General Comments      Exercises     Assessment/Plan    PT Assessment Patient needs continued PT services  PT Problem List Decreased strength;Decreased activity tolerance;Decreased balance;Decreased mobility       PT Treatment Interventions DME instruction;Functional mobility training;Therapeutic activities;Therapeutic exercise;Patient/family education;Wheelchair mobility training    PT Goals (Current goals can be found in the Care Plan section)  Acute Rehab PT Goals Patient Stated Goal: return home with family to assist PT Goal Formulation: With patient Time For Goal Achievement: 09/02/20 Potential to Achieve Goals: Good    Frequency Min 3X/week   Barriers to discharge        Co-evaluation               AM-PAC PT "6 Clicks" Mobility  Outcome Measure Help needed turning from your  back to your side while in a flat bed without using bedrails?: None Help needed moving from lying on your back to sitting on the side of a flat bed without using bedrails?: A Little Help needed moving to and from a bed to a chair (including a wheelchair)?: A Little Help needed standing up from a chair using your arms (e.g., wheelchair or bedside chair)?: A Little Help needed to walk in hospital room?: Total Help needed climbing 3-5 steps with a railing? : Total 6 Click Score: 15    End of Session   Activity Tolerance: Patient tolerated treatment well;Patient limited by fatigue Patient left: in chair;with call bell/phone within reach Nurse Communication: Mobility status PT Visit Diagnosis: Unsteadiness on feet (R26.81);Other abnormalities of gait and mobility (R26.89);Muscle weakness (generalized) (M62.81)    Time: 6256-3893 PT Time Calculation (min) (ACUTE ONLY): 32 min   Charges:   PT Evaluation $PT Eval Moderate Complexity: 1 Mod PT Treatments $Therapeutic Activity: 23-37 mins        12:10 PM, 08/29/20 Lonell Grandchild, MPT Physical Therapist with [facility] [phone] office [phone] mobile phone

## 2020-08-29 NOTE — Progress Notes (Signed)
Tiffany Stewart is a 50 y.o. female with medical history significant for paraplegia, anxiety, depression, migraine and chronic pain who presents to the ED via EMS due to fall and confusion.    Patient was found in the bathroom incontinent and confused with garbled speech.  CT head and CTA head and neck with no acute findings noted.  MRI brain with and without contrast and routine EEG ordered per neurology recommendations to evaluate for possible seizure activity.  Carotid ultrasound without any acute findings of stenosis and 2D echocardiogram with LVEF 60 to 65% and no other acute findings noted.  There is some concern for polypharmacy due to history of chronic pain and anxiety and multiple medications.  Patient is wheelchair-bound at home.  Resumed home medications.  -Discussed with daughter on phone 1/16.  Total care time: 30 minutes.

## 2020-08-29 NOTE — Plan of Care (Signed)
  Problem: Acute Rehab PT Goals(only PT should resolve) Goal: Pt will Roll Supine to Side Outcome: Progressing Flowsheets (Taken 08/29/2020 1212) Pt will Roll Supine to Side: with modified independence Goal: Pt Will Go Supine/Side To Sit Outcome: Progressing Flowsheets (Taken 08/29/2020 1212) Pt will go Supine/Side to Sit: with modified independence Goal: Pt Will Go Sit To Supine/Side Outcome: Progressing Flowsheets (Taken 08/29/2020 1212) Pt will go Sit to Supine/Side: with modified independence Goal: Patient Will Perform Sitting Balance Outcome: Progressing Flowsheets (Taken 08/29/2020 1212) Patient will perform sitting balance:  Independently  with modified independence Goal: Patient Will Transfer Sit To/From Stand Outcome: Progressing Flowsheets (Taken 08/29/2020 1212) Patient will transfer sit to/from stand:  with modified independence  with supervision Goal: Pt Will Transfer Bed To Chair/Chair To Bed Outcome: Progressing Flowsheets (Taken 08/29/2020 1212) Pt will Transfer Bed to Chair/Chair to Bed:  with modified independence  with supervision   12:13 PM, 08/29/20 Lonell Grandchild, MPT Physical Therapist with [facility] [phone] office [phone] mobile phone

## 2020-08-29 NOTE — Consult Note (Signed)
  [facility] TeleNeurology Consult Services  Stat Consult  Date of Service:   08/29/2020 04:06:01  Diagnosis:     .  R56.9 - Seizures  Impression: The patient is a 50 year old woman with a history of paraplegia, anxiety, depression, migraine, chronic pain, who presents with fall and confusion, concerning for unwitnessed seizure. Recommend further evaluation with MRI brain with and without contrast and EEG. No antiepileptic drugs for now unless she has recurrent event. Given initial hypotension in ER, syncope vs encephalopathy is also in differential. Recommend metabolic evaluation and syncope evaluation per primary service.  CT HEAD: Showed No Acute Hemorrhage or Acute Core Infarct Reviewed negative CTA head/neck  Our recommendations are outlined below.  Diagnostic Studies: MRI brain w/wo contrast Routine EEG  Disposition: Neurology will follow  Free Text:   Additional Recommendations::    Metrics: [facility] Notification Time: 08/29/2020 04:04:13 Stamp Time: 08/29/2020 04:06:01 Callback Response Time: 08/29/2020 04:06:49   ----------------------------------------------------------------------------------------------------  Chief Complaint: loss of consciousness episode  History of Present Illness: Patient is a 50 year old Female.  Patient was found in bathroom floor incontinent and confused. Had not been picking up phone for hours. Initially was not opening eyes, repeating self, garbled speech. EMS called, glucose 131 and BP 75/39. Improving in ER but not back to baseline per daughter. Patient is poor historian. On Xanax, says she takes consistently without missing doses, denies alcohol abuse.   Past Medical History:     . paraplegia, wheelchair dependent, anxiety, chronic pain, depression, migraine     Examination: BP(141/69), Pulse(69), Blood Glucose(112) 1A: Level of Consciousness - Alert; keenly responsive + 0 1B: Ask Month  and Age - Both Questions Right + 0 1C: Blink Eyes & Squeeze Hands - Performs Both Tasks + 0 2: Test Horizontal Extraocular Movements - Normal + 0 3: Test Visual Fields - No Visual Loss + 0 4: Test Facial Palsy (Use Grimace if Obtunded) - Normal symmetry + 0 5A: Test Left Arm Motor Drift - No Drift for 10 Seconds + 0 5B: Test Right Arm Motor Drift - No Drift for 10 Seconds + 0 6A: Test Left Leg Motor Drift - No Drift for 5 Seconds + 0 6B: Test Right Leg Motor Drift - No Movement + 4 7: Test Limb Ataxia (FNF/Heel-Shin) - No Ataxia + 0 8: Test Sensation - Normal; No sensory loss + 0 9: Test Language/Aphasia - Normal; No aphasia + 0 10: Test Dysarthria - Mild-Moderate Dysarthria: Slurring but can be understood + 1 11: Test Extinction/Inattention - No abnormality + 0  NIHSS Score: 5   Patient / Family was informed the Neurology Consult would occur via TeleHealth consult by way of interactive audio and video telecommunications and consented to receiving care in this manner.  Patient is being evaluated for possible acute neurologic impairment and high probability of imminent or life - threatening deterioration.I spent total of 22 minutes providing care to this patient, including time for face to face visit via telemedicine, review of medical records, imaging studies and discussion of findings with providers, the patient and / or family.   Dr Serita Grammes   [facility] [phone]  Case [id number]

## 2020-08-29 NOTE — Progress Notes (Signed)
  Echocardiogram 2D Echocardiogram has been performed.  Tiffany Stewart 08/29/2020, 9:04 AM

## 2020-08-29 NOTE — H&P (Signed)
History and Physical  Tiffany Stewart [id number] DOB: Sep 18, 1970 DOA: 08/28/2020  Referring physician: Rolland Porter, MD  PCP: Chevis Pretty, [place]  Patient coming from: Home  Chief Complaint: Loss of Consciousness  HPI: Tiffany Stewart is a 50 y.o. female with medical history significant for paraplegia, anxiety, depression, migraine and chronic pain who presents to the ED via EMS due to fall and confusion.  Patient was unable to provide history of why she came to the ED, history was obtained from ED physician and daughter at bedside.  Per daughter, patient lives with one of the daughters who was not at home when she sustained a fall.  Apparently, patient complained of pain in her legs, so she took 2 tablets of 0.5 mg of Xanax (instead of normal dose of 1 tablet), she is wheelchair-bound and went to use the restroom, she possibly sustained a fall in the toilet, on arrival of the daughter (within 1-1.5 hours of onset of fall), patient was noted to be underneath her wheelchair with urinary incontinence, she was helped to sit on the toilet, but patient continued to repeat herself over and over again (different from her baseline of functioning), so EMS was activated and on arrival of EMS team, CBG check was 131, but BP was low as 75/39.  Patient was taken to the ED for further evaluation.  ED Course:  In the emergency department, vital signs were normal, BP was 141/69.  Work-up in the ED showed normal CBC and BMP except for elevated MCV at 100.5.  Urine drug screen was positive for benzodiazepine, urinalysis was unimpressive for UTI, alcohol level was negative. CT angiography of head and neck was normal CT of head without contrast showed no acute intracranial abnormality. IV hydration of NS 500 mL was given.  Teleneurology was consulted and recommended further work-up to rule out seizures.  Hospitalist was asked to admit patient for further evaluation and management.   Review of  Systems: Constitutional: Negative for chills and fever.  HENT: Negative for ear pain and sore throat.   Eyes: Negative for pain and visual disturbance.  Respiratory: Negative for cough, chest tightness and shortness of breath.   Cardiovascular: Negative for chest pain and palpitations.  Gastrointestinal: Negative for abdominal pain and vomiting.  Endocrine: Negative for polyphagia and polyuria.  Musculoskeletal: Negative for arthralgias and back pain.  Skin: Negative for color change and rash.  Allergic/Immunologic: Negative for immunocompromised state.  Neurological: Positive for confusion.  Negative for tremors, speech difficulty Hematological: Does not bruise/bleed easily.  All other systems reviewed and are negative  Review of systems as noted in the HPI. All other systems reviewed and are negative.   Past Medical History:  Diagnosis Date  . Anxiety   . Chronic pain   . Depression   . GERD (gastroesophageal reflux disease)   . HSP (hereditary spastic paraplegia) ([place])   . Migraines   . Perforated peptic ulcer ([place]) 03/17/2018   Goody powder overuse   Past Surgical History:  Procedure Laterality Date  . CESAREAN SECTION    . CHOLECYSTECTOMY    . ENDOMETRIAL ABLATION  2000  . GASTRORRHAPHY  03/17/2018   Procedure: GASTRORRHAPHY;  Surgeon: Aviva Signs, MD;  Location: [facility];  Service: General;;  . HERNIA REPAIR    . LAPAROTOMY N/A 03/17/2018   Procedure: EXPLORATORY LAPAROTOMY;  Surgeon: Aviva Signs, MD;  Location: [facility];  Service: General;  Laterality: N/A;    Social History:  reports that she has been smoking cigarettes.  She has a 13.50 pack-year smoking history. She has never used smokeless tobacco. She reports that she does not drink alcohol and does not use drugs.   Allergies  Allergen Reactions  . Hctz [Hydrochlorothiazide] Diarrhea  . Morphine Other (See Comments)    Flushing, turns skin red   . Zoloft [Sertraline Hcl] Other (See Comments)    Spaced out     Family History  Problem Relation Age of Onset  . Diabetes Father   . Cancer Father        lung cancer  . Heart disease Father   . Heart disease Maternal Grandmother   . Stroke Maternal Grandfather   . Diabetes Paternal Grandmother   . Colon cancer Neg Hx     Prior to Admission medications   Medication Sig Start Date End Date Taking? Authorizing Provider  acetaminophen (TYLENOL) 500 MG tablet Take 1,500 mg by mouth 2 (two) times daily as needed for headache.     [provider]  ALPRAZolam Prudy Feeler) 0.5 MG tablet Take 1 tablet (0.5 mg total) by mouth 2 (two) times daily as needed. for anxiety 08/03/20   Bennie Pierini, FNP  baclofen (LIORESAL) 10 MG tablet Take 1 tablet (10 mg total) by mouth 2 (two) times daily. 08/03/20   Daphine Deutscher, Mary-Margaret, FNP  diclofenac Sodium (VOLTAREN) 1 % GEL Apply 4 g topically 4 (four) times daily. 08/03/20   Daphine Deutscher, Mary-Margaret, FNP  escitalopram (LEXAPRO) 10 MG tablet Take 1 tablet (10 mg total) by mouth daily. 08/03/20   Daphine Deutscher, Mary-Margaret, FNP  pantoprazole (PROTONIX) 40 MG tablet Take 1 tablet (40 mg total) by mouth 2 (two) times daily before a meal. 08/03/20   Daphine Deutscher, Mary-Margaret, FNP  topiramate (TOPAMAX) 50 MG tablet TAKE (1) TABLET TWICE DAILY. 08/03/20   Bennie Pierini, FNP    Physical Exam: BP (!) 159/115   Pulse 75   Temp 97.8 F (36.6 C) (Rectal)   Resp 19   Ht 5' (1.524 m)   Wt 53.5 kg   SpO2 99%   BMI 23.03 kg/m   . General: 50 y.o. year-old female well developed well nourished in no acute distress.  [name] HEENT: NCAT, EOMI . Neck: Supple, trachea medial . Cardiovascular: Regular rate and rhythm with no rubs or gallops.  No thyromegaly or JVD noted.  No lower extremity edema. 2/4 pulses in all 4 extremities. [name] Respiratory: Clear to auscultation with no wheezes or rales. Good inspiratory effort. . Abdomen: Soft nontender nondistended with normal bowel sounds x4 quadrants. . Muskuloskeletal: No cyanosis,  clubbing or edema noted bilaterally . Neuro: Alert and oriented to person, place and time.  Sensation intact.  Further neurological exam limited due to patient's current condition. . Skin: No ulcerative lesions noted or rashes . Psychiatry: Mood is appropriate for condition and setting          Labs on Admission:  Basic Metabolic Panel: Recent Labs  Lab 08/28/20 2348  NA 143  K 4.1  CL 110  CO2 26  GLUCOSE 112*  BUN 19  CREATININE 0.69  CALCIUM 9.5   Liver Function Tests: Recent Labs  Lab 08/28/20 2348  AST 16  ALT 15  ALKPHOS 64  BILITOT 0.3  PROT 8.1  ALBUMIN 4.4   No results for input(s): LIPASE, AMYLASE in the last 168 hours. No results for input(s): AMMONIA in the last 168 hours. CBC: Recent Labs  Lab 08/28/20 2348  WBC 8.4  NEUTROABS 5.7  HGB 14.4  HCT 44.3  MCV 100.5*  PLT 284   Cardiac Enzymes: No results for input(s): CKTOTAL, CKMB, CKMBINDEX, TROPONINI in the last 168 hours.  BNP (last 3 results) No results for input(s): BNP in the last 8760 hours.  ProBNP (last 3 results) No results for input(s): PROBNP in the last 8760 hours.  CBG: Recent Labs  Lab 08/28/20 2346  GLUCAP 98    Radiological Exams on Admission: CT Angio Head W or Wo Contrast  Result Date: 08/29/2020 CLINICAL DATA:  Encephalopathy EXAM: CT ANGIOGRAPHY HEAD AND NECK TECHNIQUE: Multidetector CT imaging of the head and neck was performed using the standard protocol during bolus administration of intravenous contrast. Multiplanar CT image reconstructions and MIPs were obtained to evaluate the vascular anatomy. Carotid stenosis measurements (when applicable) are obtained utilizing NASCET criteria, using the distal internal carotid diameter as the denominator. CONTRAST:  80mL OMNIPAQUE IOHEXOL 350 MG/ML SOLN COMPARISON:  None. FINDINGS: CTA NECK FINDINGS SKELETON: There is no bony spinal canal stenosis. No lytic or blastic lesion. OTHER NECK: Normal pharynx, larynx and major salivary  glands. No cervical lymphadenopathy. Unremarkable thyroid gland. UPPER CHEST: No pneumothorax or pleural effusion. No nodules or masses. AORTIC ARCH: There is no calcific atherosclerosis of the aortic arch. There is no aneurysm, dissection or hemodynamically significant stenosis of the visualized portion of the aorta. Conventional 3 vessel aortic branching pattern. The visualized proximal subclavian arteries are widely patent. RIGHT CAROTID SYSTEM: Normal without aneurysm, dissection or stenosis. LEFT CAROTID SYSTEM: Normal without aneurysm, dissection or stenosis. VERTEBRAL ARTERIES: Left dominant configuration. Both origins are clearly patent. There is no dissection, occlusion or flow-limiting stenosis to the skull base (V1-V3 segments). CTA HEAD FINDINGS POSTERIOR CIRCULATION: --Vertebral arteries: Normal V4 segments. --Inferior cerebellar arteries: Normal. --Basilar artery: Normal. --Superior cerebellar arteries: Normal. --Posterior cerebral arteries (PCA): Normal. ANTERIOR CIRCULATION: --Intracranial internal carotid arteries: Normal. --Anterior cerebral arteries (ACA): Normal. Both A1 segments are present. Patent anterior communicating artery (a-comm). --Middle cerebral arteries (MCA): Normal. VENOUS SINUSES: As permitted by contrast timing, patent. ANATOMIC VARIANTS: None Review of the MIP images confirms the above findings. IMPRESSION: Normal CTA of the head and neck. Electronically Signed   By: Ulyses Jarred M.D.   On: 08/29/2020 03:30   CT HEAD WO CONTRAST  Result Date: 08/29/2020 CLINICAL DATA:  Mental status change EXAM: CT HEAD WITHOUT CONTRAST TECHNIQUE: Contiguous axial images were obtained from the base of the skull through the vertex without intravenous contrast. COMPARISON:  None. FINDINGS: Brain: No evidence of acute territorial infarction, hemorrhage, hydrocephalus,extra-axial collection or mass lesion/mass effect. Normal gray-white differentiation. Ventricles are normal in size and contour.  Vascular: No hyperdense vessel or unexpected calcification. Skull: The skull is intact. No fracture or focal lesion identified. Sinuses/Orbits: The visualized paranasal sinuses and mastoid air cells are clear. The orbits and globes intact. Other: None IMPRESSION: No acute intracranial abnormality. Electronically Signed   By: Prudencio Pair M.D.   On: 08/29/2020 01:14   CT Angio Neck W and/or Wo Contrast  Result Date: 08/29/2020 CLINICAL DATA:  Encephalopathy EXAM: CT ANGIOGRAPHY HEAD AND NECK TECHNIQUE: Multidetector CT imaging of the head and neck was performed using the standard protocol during bolus administration of intravenous contrast. Multiplanar CT image reconstructions and MIPs were obtained to evaluate the vascular anatomy. Carotid stenosis measurements (when applicable) are obtained utilizing NASCET criteria, using the distal internal carotid diameter as the denominator. CONTRAST:  72mL OMNIPAQUE IOHEXOL 350 MG/ML SOLN COMPARISON:  None. FINDINGS: CTA NECK FINDINGS SKELETON: There is no bony spinal canal stenosis. No lytic or blastic  lesion. OTHER NECK: Normal pharynx, larynx and major salivary glands. No cervical lymphadenopathy. Unremarkable thyroid gland. UPPER CHEST: No pneumothorax or pleural effusion. No nodules or masses. AORTIC ARCH: There is no calcific atherosclerosis of the aortic arch. There is no aneurysm, dissection or hemodynamically significant stenosis of the visualized portion of the aorta. Conventional 3 vessel aortic branching pattern. The visualized proximal subclavian arteries are widely patent. RIGHT CAROTID SYSTEM: Normal without aneurysm, dissection or stenosis. LEFT CAROTID SYSTEM: Normal without aneurysm, dissection or stenosis. VERTEBRAL ARTERIES: Left dominant configuration. Both origins are clearly patent. There is no dissection, occlusion or flow-limiting stenosis to the skull base (V1-V3 segments). CTA HEAD FINDINGS POSTERIOR CIRCULATION: --Vertebral arteries: Normal V4  segments. --Inferior cerebellar arteries: Normal. --Basilar artery: Normal. --Superior cerebellar arteries: Normal. --Posterior cerebral arteries (PCA): Normal. ANTERIOR CIRCULATION: --Intracranial internal carotid arteries: Normal. --Anterior cerebral arteries (ACA): Normal. Both A1 segments are present. Patent anterior communicating artery (a-comm). --Middle cerebral arteries (MCA): Normal. VENOUS SINUSES: As permitted by contrast timing, patent. ANATOMIC VARIANTS: None Review of the MIP images confirms the above findings. IMPRESSION: Normal CTA of the head and neck. Electronically Signed   By: Ulyses Jarred M.D.   On: 08/29/2020 03:30    EKG: I independently viewed the EKG done and my findings are as followed: Normal sinus rhythm at a rate of 74 bpm  Assessment/Plan Present on Admission: . Altered mental status . HSP (hereditary spastic paraplegia) ([place]) . Anxiety state . TOBACCO ABUSE . GERD  Principal Problem:   Altered mental status Active Problems:   Anxiety state   TOBACCO ABUSE   GERD   HSP (hereditary spastic paraplegia) ([facility])   Polypharmacy   Altered mental status possibly secondary to polypharmacy, R/O seizures Unwitnessed fall at home in the setting of above Patient took twice the normal dose of Xanax at home prior to onset of symptoms Urine drug screen was positive for benzodiazepine CT head without contrast, CT angiography of head and neck were negative MRI brain without contrast will be done in the morning EEG will be done in the morning to rule out seizures N.p.o. until after passing bedside swallow eval Continue fall precaution, seizure precaution, aspiration precaution and neurochecks Consider teleneurology consult based on MRI and EEG findings.  Hereditary spastic paraplegia/chronic pain Continue home meds after patient passes bedside swallow eval Continue PT/OT eval and treat  Anxiety  Xanax will be temporarily held with plan to resume home dose so as to  prevent benzodiazepine withdrawal seizure.  GERD Continue Protonix  Tobacco abuse Patient counseled on tobacco abuse cessation   DVT prophylaxis: SCDs (consider starting chemoprophylaxis on detecting negative hemorrhage on MRI)  Code Status: Full code  Family Communication: Daughter at bedside/other daughter on cell phone (all questions answered satisfaction)  Disposition Plan:  Patient is from:                        home Anticipated DC to:                   home Anticipated DC date:                1 day Anticipated DC barriers:          Patient is unstable to be discharged at this time due to pending further work-up to rule out seizures  Consults called: Teleneurology (by ED physician)  Admission status: Observation  Bernadette Hoit MD [facility]  08/29/2020, 5:50 AM

## 2020-08-29 NOTE — Care Management Obs Status (Signed)
[place] NOTIFICATION   Patient Details  Name: Tiffany Stewart MRN: [id number] Date of Birth: 1971/06/13   Medicare Observation Status Notification Given:  Yes    Natasha Bence, LCSW 08/29/2020, 3:58 PM

## 2020-08-30 ENCOUNTER — Observation Stay (HOSPITAL_COMMUNITY): Payer: Medicare HMO

## 2020-08-30 DIAGNOSIS — R29818 Other symptoms and signs involving the nervous system: Secondary | ICD-10-CM | POA: Diagnosis not present

## 2020-08-30 DIAGNOSIS — G9389 Other specified disorders of brain: Secondary | ICD-10-CM | POA: Diagnosis not present

## 2020-08-30 DIAGNOSIS — R4182 Altered mental status, unspecified: Secondary | ICD-10-CM | POA: Diagnosis not present

## 2020-08-30 LAB — COMPREHENSIVE METABOLIC PANEL
ALT: 13 U/L (ref 0–44)
AST: 14 U/L — ABNORMAL LOW (ref 15–41)
Albumin: 3.8 g/dL (ref 3.5–5.0)
Alkaline Phosphatase: 54 U/L (ref 38–126)
Anion gap: 8 (ref 5–15)
BUN: 18 mg/dL (ref 6–20)
CO2: 21 mmol/L — ABNORMAL LOW (ref 22–32)
Calcium: 8.7 mg/dL — ABNORMAL LOW (ref 8.9–10.3)
Chloride: 110 mmol/L (ref 98–111)
Creatinine, Ser: 0.65 mg/dL (ref 0.44–1.00)
GFR, Estimated: >60 mL/min (ref >60)
Glucose, Bld: 92 mg/dL (ref 70–99)
Potassium: 3.6 mmol/L (ref 3.5–5.1)
Sodium: 139 mmol/L (ref 135–145)
Total Bilirubin: 0.4 mg/dL (ref 0.3–1.2)
Total Protein: 6.7 g/dL (ref 6.5–8.1)

## 2020-08-30 LAB — PHOSPHORUS: Phosphorus: 4.8 mg/dL — ABNORMAL HIGH (ref 2.5–4.6)

## 2020-08-30 LAB — CBC
HCT: 37.5 % (ref 36.0–46.0)
Hemoglobin: 12.3 g/dL (ref 12.0–15.0)
MCH: 33 pg (ref 26.0–34.0)
MCHC: 32.8 g/dL (ref 30.0–36.0)
MCV: 100.5 fL — ABNORMAL HIGH (ref 80.0–100.0)
Platelets: 246 10*3/uL (ref 150–400)
RBC: 3.73 MIL/uL — ABNORMAL LOW (ref 3.87–5.11)
RDW: 12.8 % (ref 11.5–15.5)
WBC: 6 10*3/uL (ref 4.0–10.5)
nRBC: 0 % (ref 0.0–0.2)

## 2020-08-30 LAB — APTT: aPTT: 27 seconds (ref 24–36)

## 2020-08-30 LAB — PROTIME-INR
INR: 1 (ref 0.8–1.2)
Prothrombin Time: 12.7 seconds (ref 11.4–15.2)

## 2020-08-30 LAB — MAGNESIUM: Magnesium: 2 mg/dL (ref 1.7–2.4)

## 2020-08-30 LAB — HIV ANTIBODY (ROUTINE TESTING W REFLEX): HIV Screen 4th Generation wRfx: NONREACTIVE

## 2020-08-30 MED ORDER — GADOBUTROL 1 MMOL/ML IV SOLN
7.0000 mL | Freq: Once | INTRAVENOUS | Status: AC | PRN
  Administered 2020-08-30: 7 mL via INTRAVENOUS

## 2020-08-30 NOTE — Progress Notes (Addendum)
PT Cancellation Note  Patient Details Name: Tiffany Stewart MRN: [id number] DOB: September 20, 1970   Cancelled Treatment:    Reason Eval/Treat Not Completed: Patient declined, no reason specified; Patient stated she was on the bed pan and did not wish to participated with PT at that time. Will check back later if time permits.   12:06 PM, 08/30/20 Mearl Latin PT, DPT Physical Therapist at [facility]

## 2020-08-30 NOTE — TOC Initial Note (Signed)
Transition of Care [facility]) - Initial/Assessment Note    Patient Details  Name: Tiffany Stewart MRN: [id number] Date of Birth: 1971/02/05  Transition [facility]) CM/SW Contact:    Natasha Bence, LCSW Phone Number: 08/30/2020, 2:48 PM  Clinical Narrative:                 Patient is a 50 year old female admitted for Altered Mental Status. CSW conducted intial assessment for patient. Patient was able to perform ADL's independently weeks prior to hospital admission. Patient's daughter had since provided patient with assistance completing ADL's and offering help with care. CSW received HH order. CSW contacted patient's daughter to identify if patient agreeable to [facility] referral. Patient's daughter reported that patient is agreeable to [facility]. CSW placed referral with Georgina Snell from [place]. Georgina Snell agreeable to provide [facility] services. TOC to follow.   Expected Discharge Plan: [place] Barriers to Discharge: Continued Medical Work up   Patient Goals and CMS Choice Patient states their goals for this hospitalization and ongoing recovery are:: Return home with [facility]   Choice offered to / list presented to : [place]  Expected Discharge Plan and Services Expected Discharge Plan: [place] In-house Referral: NA Discharge Planning Services: NA Post Acute Care Choice: NA Living arrangements for the past 2 months: Single Family Home                 DME Arranged: N/A DME Agency: NA       HH Arranged: PT HH Agency: [place] Date [facility] Agency Contacted: 08/30/20 Time HH Agency Contacted: 1200 Representative spoke with at [place]: Georgina Snell  Prior Living Arrangements/Services Living arrangements for the past 2 months: [place] Lives with:: Self Patient language and need for interpreter reviewed:: Yes Do you feel safe going back to the place where you live?: Yes      Need for Family Participation in Patient Care: Yes (Comment) Care giver  support system in place?: Yes (comment)   Criminal Activity/Legal Involvement Pertinent to Current Situation/Hospitalization: No - Comment as needed  Activities of Daily Living Home Assistive Devices/Equipment: Wheelchair ADL Screening (condition at time of admission) Patient's cognitive ability adequate to safely complete daily activities?: Yes Is the patient deaf or have difficulty hearing?: No Does the patient have difficulty seeing, even when wearing glasses/contacts?: No Does the patient have difficulty concentrating, remembering, or making decisions?: No Patient able to express need for assistance with ADLs?: Yes Does the patient have difficulty dressing or bathing?: Yes Independently performs ADLs?: No Communication: Independent Dressing (OT): Independent Grooming: Needs assistance Is this a change from baseline?: Pre-admission baseline Feeding: Independent Bathing: Needs assistance Is this a change from baseline?: Pre-admission baseline Toileting: Independent In/Out Bed: Independent Walks in Home: Independent with device (comment) Does the patient have difficulty walking or climbing stairs?: Yes Weakness of Legs: Both Weakness of Arms/Hands: None  Permission Sought/Granted Permission sought to share information with : Family Supports Permission granted to share information with : Yes, Verbal Permission Granted  Share Information with NAME: Lionel December  Permission granted to share info w AGENCY: [facility]  Permission granted to share info w Relationship: Daughter     Emotional Assessment     Affect (typically observed): Accepting,Adaptable Orientation: : Oriented to Self,Oriented to Situation,Oriented to Place,Oriented to  Time Alcohol / Substance Use: Not Applicable Psych Involvement: No (comment)  Admission diagnosis:  Somnolence [R40.0] Syncope [R55] Altered mental status [R41.82] Patient Active  Problem List   Diagnosis Date Noted  . Altered mental status  08/29/2020  . Polypharmacy 08/29/2020  . Unwitnessed fall 08/29/2020  . Perforated peptic ulcer ([place]) 03/17/2018  . Irritable bowel syndrome 06/04/2017  . Migraine with aura and with status migrainosus, not intractable 02/09/2017  . Baclofen overdose 02/09/2017  . Protein-calorie malnutrition, severe 11/15/2016  . Iron deficiency anemia 11/13/2016  . HSP (hereditary spastic paraplegia) ([place]) 04/27/2014  . TOBACCO ABUSE 11/11/2009  . Anxiety state 03/06/2008  . GERD 03/06/2008   PCP:  Chevis Pretty, FNP Pharmacy:   [facility], [place] [address] [place] [address] Phone: [phone] Fax: [phone]     Social Determinants of Health (SDOH) Interventions    Readmission Risk Interventions No flowsheet data found.

## 2020-08-30 NOTE — Plan of Care (Signed)
Headache resolved overnight with tylenol. No complaints, frequently requests for bedpan- unable to have bowel movement. No distention or complaints of abd pain overnight.

## 2020-08-30 NOTE — Progress Notes (Signed)
OT Cancellation Note  Patient Details Name: Tiffany Stewart MRN: [id number] DOB: 1971/03/08   Cancelled Treatment:    Reason Eval/Treat Not Completed: OT screened, no needs identified, will sign off  OT orders received. Patient's chart reviewed. Pt is nonambulatory and wheelchair bound. She lives with family and receives assistance with ADL tasks. She has all the necessary DME needed at home. Patient is at her baseline for ADL tasks while requiring more assistance during her acute care stay due to set up and room environment is not ideal for her maximum independence. I do not see any additional need for OT services once discharged home.   Ailene Ravel, OTR/L,CBIS  [phone]  08/30/2020, 12:09 PM

## 2020-08-30 NOTE — Progress Notes (Signed)
Spoke to Network engineer who called to see when patient's EEG would be done. Informed her patient will be on the list for tomorrow due to staffing issues.

## 2020-08-30 NOTE — Progress Notes (Signed)
PROGRESS NOTE    Tiffany Stewart  [id number] DOB: 1970-11-28 DOA: 08/28/2020 PCP: Chevis Pretty, FNP   Brief Narrative:  SHIVANI Stewart a 50 y.o.femalewith medical history significant forparaplegia, anxiety, depression, migraineandchronic pain who presents to the ED viaEMS due to fall and confusion.   Patient was found in the bathroom incontinent and confused with garbled speech.  CT head and CTA head and neck with no acute findings noted.  MRI brain with and without contrast with no acute findings and routine EEG ordered per neurology currently pending.  Carotid ultrasound without any acute findings of stenosis and 2D echocardiogram with LVEF 60 to 65% and no other acute findings noted.  There is some concern for polypharmacy due to history of chronic pain and anxiety and multiple medications.  Patient is wheelchair-bound at home.  Assessment & Plan:   Principal Problem:   Altered mental status Active Problems:   Anxiety state   TOBACCO ABUSE   GERD   HSP (hereditary spastic paraplegia) (HCC)   Polypharmacy   Unwitnessed fall   Altered mental status possibly secondary to polypharmacy, R/O seizures Unwitnessed fall at home in the setting of above Patient took twice the normal dose of Xanax at home prior to onset of symptoms Urine drug screen was positive for benzodiazepine CT head without contrast, CT angiography of head and neck were negative MRI brain with no acute findings EEG currently pending Continue fall precaution, seizure precaution, aspiration precaution and neurochecks Discussed findings with neurology prior to discharge  Hereditary spastic paraplegia/chronic pain Continue home medications PT OT recommendations for home health  Anxiety  Xanax will be temporarily held with plan to resume home dose so as to prevent benzodiazepine withdrawal seizure.  GERD Continue Protonix  Tobacco abuse Patient counseled on tobacco abuse cessation   DVT  prophylaxis: SCDs Code Status: Full Family Communication: Discussed with daughter on phone 1/17 Disposition Plan:  Status is: Observation  The patient will require care spanning > 2 midnights and should be moved to inpatient because: Ongoing diagnostic testing needed not appropriate for outpatient work up  Dispo: The patient is from: Home              Anticipated d/c is to: Home              Anticipated d/c date is: 1 day              Patient currently is not medically stable to d/c.  Patient requires EEG evaluation and is currently pending.  Nutritional Assessment:  The patient's BMI is: Body mass index is 23.03 kg/m.[name]  Consultants:   None  Procedures:   See below  Antimicrobials:   None   Subjective: Patient seen and evaluated today with no new acute complaints or concerns. No acute concerns or events noted overnight.  Objective: Vitals:   08/29/20 1800 08/29/20 2000 08/30/20 0513 08/30/20 1000  BP: 137/90 120/86 124/84 136/88  Pulse:  86 88 88  Resp: 20 17 20 20   Temp: 98.3 F (36.8 C) 98.3 F (36.8 C) 99 F (37.2 C) 98.1 F (36.7 C)  TempSrc:  Oral Oral Oral  SpO2: 95%  95% 97%  Weight:      Height:        Intake/Output Summary (Last 24 hours) at 08/30/2020 1429 Last data filed at 08/30/2020 1026 Gross per 24 hour  Intake 240 ml  Output 450 ml  Net -210 ml   Filed Weights   08/28/20 2347 08/29/20 1000  Weight: 53.5 kg 53.5 kg    Examination:  General exam: Appears calm and comfortable  Respiratory system: Clear to auscultation. Respiratory effort normal. Cardiovascular system: S1 & S2 heard, RRR.  Gastrointestinal system: Abdomen is soft Central nervous system: Alert and awake Extremities: No edema Skin: No significant lesions noted Psychiatry: Flat affect.    Data Reviewed: I have personally reviewed following labs and imaging studies  CBC: Recent Labs  Lab 08/28/20 2348 08/30/20 0500  WBC 8.4 6.0  NEUTROABS 5.7  --   HGB 14.4  12.3  HCT 44.3 37.5  MCV 100.5* 100.5*  PLT 284 967   Basic Metabolic Panel: Recent Labs  Lab 08/28/20 2348 08/30/20 0500  NA 143 139  K 4.1 3.6  CL 110 110  CO2 26 21*  GLUCOSE 112* 92  BUN 19 18  CREATININE 0.69 0.65  CALCIUM 9.5 8.7*  MG  --  2.0  PHOS  --  4.8*   GFR: Estimated Creatinine Clearance: 62.4 mL/min (by C-G formula based on SCr of 0.65 mg/dL). Liver Function Tests: Recent Labs  Lab 08/28/20 2348 08/30/20 0500  AST 16 14*  ALT 15 13  ALKPHOS 64 54  BILITOT 0.3 0.4  PROT 8.1 6.7  ALBUMIN 4.4 3.8   No results for input(s): LIPASE, AMYLASE in the last 168 hours. No results for input(s): AMMONIA in the last 168 hours. Coagulation Profile: Recent Labs  Lab 08/28/20 2348 08/30/20 0500  INR 1.0 1.0   Cardiac Enzymes: No results for input(s): CKTOTAL, CKMB, CKMBINDEX, TROPONINI in the last 168 hours. BNP (last 3 results) No results for input(s): PROBNP in the last 8760 hours. HbA1C: No results for input(s): HGBA1C in the last 72 hours. CBG: Recent Labs  Lab 08/28/20 2346  GLUCAP 98   Lipid Profile: No results for input(s): CHOL, HDL, LDLCALC, TRIG, CHOLHDL, LDLDIRECT in the last 72 hours. Thyroid Function Tests: No results for input(s): TSH, T4TOTAL, FREET4, T3FREE, THYROIDAB in the last 72 hours. Anemia Panel: No results for input(s): VITAMINB12, FOLATE, FERRITIN, TIBC, IRON, RETICCTPCT in the last 72 hours. Sepsis Labs: No results for input(s): PROCALCITON, LATICACIDVEN in the last 168 hours.  Recent Results (from the past 240 hour(s))  Resp Panel by RT-PCR (Flu A&B, Covid) Nasopharyngeal Swab     Status: None   Collection Time: 08/29/20  3:43 AM   Specimen: Nasopharyngeal Swab; Nasopharyngeal(NP) swabs in vial transport medium  Result Value Ref Range Status   SARS Coronavirus 2 by RT PCR NEGATIVE NEGATIVE Final    Comment: (NOTE) SARS-CoV-2 target nucleic acids are NOT DETECTED.  The SARS-CoV-2 RNA is generally detectable in upper  respiratory specimens during the acute phase of infection. The lowest concentration of SARS-CoV-2 viral copies this assay can detect is 138 copies/mL. A negative result does not preclude SARS-Cov-2 infection and should not be used as the sole basis for treatment or other patient management decisions. A negative result may occur with  improper specimen collection/handling, submission of specimen other than nasopharyngeal swab, presence of viral mutation(s) within the areas targeted by this assay, and inadequate number of viral copies(<138 copies/mL). A negative result must be combined with clinical observations, patient history, and epidemiological information. The expected result is Negative.  Fact Sheet for Patients:  EntrepreneurPulse.com.au  Fact Sheet for Healthcare Providers:  IncredibleEmployment.be  This test is no t yet approved or cleared by the [place] FDA and  has been authorized for detection and/or diagnosis of SARS-CoV-2 by FDA under an Emergency Use Authorization (  EUA). This EUA will remain  in effect (meaning this test can be used) for the duration of the COVID-19 declaration under Section 564(b)(1) of the Act, 21 U.S.C.section 360bbb-3(b)(1), unless the authorization is terminated  or revoked sooner.       Influenza A by PCR NEGATIVE NEGATIVE Final   Influenza B by PCR NEGATIVE NEGATIVE Final    Comment: (NOTE) The Xpert Xpress SARS-CoV-2/FLU/RSV plus assay is intended as an aid in the diagnosis of influenza from Nasopharyngeal swab specimens and should not be used as a sole basis for treatment. Nasal washings and aspirates are unacceptable for Xpert Xpress SARS-CoV-2/FLU/RSV testing.  Fact Sheet for Patients: EntrepreneurPulse.com.au  Fact Sheet for Healthcare Providers: IncredibleEmployment.be  This test is not yet approved or cleared by the [place] FDA and has been  authorized for detection and/or diagnosis of SARS-CoV-2 by FDA under an Emergency Use Authorization (EUA). This EUA will remain in effect (meaning this test can be used) for the duration of the COVID-19 declaration under Section 564(b)(1) of the Act, 21 U.S.C. section 360bbb-3(b)(1), unless the authorization is terminated or revoked.  Performed at [facility], [address] [place], [place] [address]          Radiology Studies: CT Angio Head W or Wo Contrast  Result Date: 08/29/2020 CLINICAL DATA:  Encephalopathy EXAM: CT ANGIOGRAPHY HEAD AND NECK TECHNIQUE: Multidetector CT imaging of the head and neck was performed using the standard protocol during bolus administration of intravenous contrast. Multiplanar CT image reconstructions and MIPs were obtained to evaluate the vascular anatomy. Carotid stenosis measurements (when applicable) are obtained utilizing NASCET criteria, using the distal internal carotid diameter as the denominator. CONTRAST:  7mL OMNIPAQUE IOHEXOL 350 MG/ML SOLN COMPARISON:  None. FINDINGS: CTA NECK FINDINGS SKELETON: There is no bony spinal canal stenosis. No lytic or blastic lesion. OTHER NECK: Normal pharynx, larynx and major salivary glands. No cervical lymphadenopathy. Unremarkable thyroid gland. UPPER CHEST: No pneumothorax or pleural effusion. No nodules or masses. AORTIC ARCH: There is no calcific atherosclerosis of the aortic arch. There is no aneurysm, dissection or hemodynamically significant stenosis of the visualized portion of the aorta. Conventional 3 vessel aortic branching pattern. The visualized proximal subclavian arteries are widely patent. RIGHT CAROTID SYSTEM: Normal without aneurysm, dissection or stenosis. LEFT CAROTID SYSTEM: Normal without aneurysm, dissection or stenosis. VERTEBRAL ARTERIES: Left dominant configuration. Both origins are clearly patent. There is no dissection, occlusion or flow-limiting stenosis to the skull base (V1-V3 segments).  CTA HEAD FINDINGS POSTERIOR CIRCULATION: --Vertebral arteries: Normal V4 segments. --Inferior cerebellar arteries: Normal. --Basilar artery: Normal. --Superior cerebellar arteries: Normal. --Posterior cerebral arteries (PCA): Normal. ANTERIOR CIRCULATION: --Intracranial internal carotid arteries: Normal. --Anterior cerebral arteries (ACA): Normal. Both A1 segments are present. Patent anterior communicating artery (a-comm). --Middle cerebral arteries (MCA): Normal. VENOUS SINUSES: As permitted by contrast timing, patent. ANATOMIC VARIANTS: None Review of the MIP images confirms the above findings. IMPRESSION: Normal CTA of the head and neck. Electronically Signed   By: Ulyses Jarred M.D.   On: 08/29/2020 03:30   CT HEAD WO CONTRAST  Result Date: 08/29/2020 CLINICAL DATA:  Mental status change EXAM: CT HEAD WITHOUT CONTRAST TECHNIQUE: Contiguous axial images were obtained from the base of the skull through the vertex without intravenous contrast. COMPARISON:  None. FINDINGS: Brain: No evidence of acute territorial infarction, hemorrhage, hydrocephalus,extra-axial collection or mass lesion/mass effect. Normal gray-white differentiation. Ventricles are normal in size and contour. Vascular: No hyperdense vessel or unexpected calcification. Skull: The skull is intact. No  fracture or focal lesion identified. Sinuses/Orbits: The visualized paranasal sinuses and mastoid air cells are clear. The orbits and globes intact. Other: None IMPRESSION: No acute intracranial abnormality. Electronically Signed   By: Prudencio Pair M.D.   On: 08/29/2020 01:14   CT Angio Neck W and/or Wo Contrast  Result Date: 08/29/2020 CLINICAL DATA:  Encephalopathy EXAM: CT ANGIOGRAPHY HEAD AND NECK TECHNIQUE: Multidetector CT imaging of the head and neck was performed using the standard protocol during bolus administration of intravenous contrast. Multiplanar CT image reconstructions and MIPs were obtained to evaluate the vascular anatomy.  Carotid stenosis measurements (when applicable) are obtained utilizing NASCET criteria, using the distal internal carotid diameter as the denominator. CONTRAST:  69mL OMNIPAQUE IOHEXOL 350 MG/ML SOLN COMPARISON:  None. FINDINGS: CTA NECK FINDINGS SKELETON: There is no bony spinal canal stenosis. No lytic or blastic lesion. OTHER NECK: Normal pharynx, larynx and major salivary glands. No cervical lymphadenopathy. Unremarkable thyroid gland. UPPER CHEST: No pneumothorax or pleural effusion. No nodules or masses. AORTIC ARCH: There is no calcific atherosclerosis of the aortic arch. There is no aneurysm, dissection or hemodynamically significant stenosis of the visualized portion of the aorta. Conventional 3 vessel aortic branching pattern. The visualized proximal subclavian arteries are widely patent. RIGHT CAROTID SYSTEM: Normal without aneurysm, dissection or stenosis. LEFT CAROTID SYSTEM: Normal without aneurysm, dissection or stenosis. VERTEBRAL ARTERIES: Left dominant configuration. Both origins are clearly patent. There is no dissection, occlusion or flow-limiting stenosis to the skull base (V1-V3 segments). CTA HEAD FINDINGS POSTERIOR CIRCULATION: --Vertebral arteries: Normal V4 segments. --Inferior cerebellar arteries: Normal. --Basilar artery: Normal. --Superior cerebellar arteries: Normal. --Posterior cerebral arteries (PCA): Normal. ANTERIOR CIRCULATION: --Intracranial internal carotid arteries: Normal. --Anterior cerebral arteries (ACA): Normal. Both A1 segments are present. Patent anterior communicating artery (a-comm). --Middle cerebral arteries (MCA): Normal. VENOUS SINUSES: As permitted by contrast timing, patent. ANATOMIC VARIANTS: None Review of the MIP images confirms the above findings. IMPRESSION: Normal CTA of the head and neck. Electronically Signed   By: Ulyses Jarred M.D.   On: 08/29/2020 03:30   MR BRAIN W WO CONTRAST  Result Date: 08/30/2020 CLINICAL DATA:  Neuro deficit, acute, stroke  suspected. EXAM: MRI HEAD WITHOUT AND WITH CONTRAST TECHNIQUE: Multiplanar, multiecho pulse sequences of the brain and surrounding structures were obtained without and with intravenous contrast. CONTRAST:  6mL GADAVIST GADOBUTROL 1 MMOL/ML IV SOLN COMPARISON:  Head CT August 29, 2020. FINDINGS: The study is partially degraded by motion. Brain: No acute infarction, hemorrhage, hydrocephalus, extra-axial collection or mass lesion. Scattered foci of T2 hyperintensity within the white matter of the cerebral hemispheres, nonspecific, more pronounced in the periventricular white matter of the frontal horns of the lateral ventricles. Prominent decreased volume of the corpus callosum and moderate cerebellar volume loss. Vascular: Normal flow voids. Skull and upper cervical spine: Normal marrow signal. Sinuses/Orbits: Negative. IMPRESSION: 1. Motion degraded study. 2. No acute intracranial abnormality. 3. T2 hyperintensity of the periventricular white matter of the frontal horns of the lateral ventricles, decreased volume of the corpus callosum and cerebellar hemispheres are likely related to patient's known hereditary spastic paraplegia. Electronically Signed   By: Pedro Earls M.D.   On: 08/30/2020 13:30   US Carotid Bilateral  Result Date: 08/29/2020 CLINICAL DATA:  50 year old with syncope. EXAM: BILATERAL CAROTID DUPLEX ULTRASOUND TECHNIQUE: Pearline Cables scale imaging, color Doppler and duplex ultrasound were performed of bilateral carotid and vertebral arteries in the neck. COMPARISON:  None. FINDINGS: Criteria: Quantification of carotid stenosis is based on velocity parameters  that correlate the residual internal carotid diameter with NASCET-based stenosis levels, using the diameter of the distal internal carotid lumen as the denominator for stenosis measurement. The following velocity measurements were obtained: RIGHT ICA: 56/27 cm/sec CCA: 55/17 cm/sec SYSTOLIC ICA/CCA RATIO:  1.0 ECA: 38 cm/sec LEFT  ICA: 78/40 cm/sec CCA: 61/21 cm/sec SYSTOLIC ICA/CCA RATIO:  1.1 ECA: 60 cm/sec RIGHT CAROTID ARTERY: Right carotid arteries are patent without significant plaque or stenosis. External carotid artery is patent with normal waveform. Normal waveforms and velocities in the internal carotid artery. RIGHT VERTEBRAL ARTERY: Antegrade flow and normal waveform in the right vertebral artery. LEFT CAROTID ARTERY: Small focus of echogenic plaque at the left carotid bulb. External carotid artery is patent with normal waveform. Normal waveforms and velocities in the internal carotid artery. LEFT VERTEBRAL ARTERY: Antegrade flow and normal waveform in the left vertebral artery. IMPRESSION: 1. Right carotid arteries are patent without significant plaque or stenosis. 2. Small focus of plaque at the left carotid bulb. No significant left carotid artery stenosis. Estimated degree of stenosis in left internal carotid artery is less than 50%. 3. Patent vertebral arteries with antegrade flow. Electronically Signed   By: Richarda Overlie M.D.   On: 08/29/2020 10:06   ECHOCARDIOGRAM COMPLETE  Result Date: 08/29/2020    ECHOCARDIOGRAM REPORT   Patient Name:   AAMYAH ASSI Date of Exam: 08/29/2020 Medical Rec #:  [id number]    Height:       60.0 in Accession #:    [id number]   Weight:       117.9 lb Date of Birth:  01-Dec-1970    BSA:          1.491 m Patient Age:    49 years     BP:           143/90 mmHg Patient Gender: F            HR:           70 bpm. [facility] Procedure: 2D Echo, Cardiac Doppler and Color Doppler Indications:    Syncope  History:        Patient has no prior history of Echocardiogram examinations.                 Signs/Symptoms:Syncope and Hypotension; Risk Factors:Current                 Smoker. Paraplegic.  Sonographer:    Lavenia Atlas RDCS Referring Phys: [id number] [facility]ont Dowdy [facility] IMPRESSIONS  1. Left ventricular ejection fraction, by estimation, is 60 to 65%. The left ventricle has normal function. The  left ventricle has no regional wall motion abnormalities. Left ventricular diastolic parameters were normal.  2. Right ventricular systolic function is normal. The right ventricular size is normal. There is normal pulmonary artery systolic pressure.  3. The mitral valve is normal in structure. Trivial mitral valve regurgitation. No evidence of mitral stenosis.  4. The aortic valve is tricuspid. Aortic valve regurgitation is not visualized. No aortic stenosis is present.  5. The inferior vena cava is normal in size with greater than 50% respiratory variability, suggesting right atrial pressure of 3 mmHg. FINDINGS  Left Ventricle: Left ventricular ejection fraction, by estimation, is 60 to 65%. The left ventricle has normal function. The left ventricle has no regional wall motion abnormalities. The left ventricular internal cavity size was normal in size. There is  no left ventricular hypertrophy. Left ventricular diastolic parameters were normal. Right Ventricle: The  right ventricular size is normal. No increase in right ventricular wall thickness. Right ventricular systolic function is normal. There is normal pulmonary artery systolic pressure. The tricuspid regurgitant velocity is 2.39 m/s, and  with an assumed right atrial pressure of 3 mmHg, the estimated right ventricular systolic pressure is 0000000 mmHg. Left Atrium: Left atrial size was normal in size. Right Atrium: Right atrial size was normal in size. Pericardium: There is no evidence of pericardial effusion. Mitral Valve: The mitral valve is normal in structure. There is mild thickening of the mitral valve leaflet(s). There is mild calcification of the mitral valve leaflet(s). Trivial mitral valve regurgitation. No evidence of mitral valve stenosis. Tricuspid Valve: The tricuspid valve is normal in structure. Tricuspid valve regurgitation is trivial. No evidence of tricuspid stenosis. Aortic Valve: The aortic valve is tricuspid. Aortic valve regurgitation is  not visualized. No aortic stenosis is present. Pulmonic Valve: The pulmonic valve was normal in structure. Pulmonic valve regurgitation is not visualized. No evidence of pulmonic stenosis. Aorta: The aortic root is normal in size and structure. Venous: The inferior vena cava is normal in size with greater than 50% respiratory variability, suggesting right atrial pressure of 3 mmHg. IAS/Shunts: No atrial level shunt detected by color flow Doppler.  LEFT VENTRICLE PLAX 2D LVIDd:         4.48 cm  Diastology LVIDs:         2.68 cm  LV e' medial:    7.72 cm/s LV PW:         0.99 cm  LV E/e' medial:  9.2 LV IVS:        0.92 cm  LV e' lateral:   7.62 cm/s LVOT diam:     1.90 cm  LV E/e' lateral: 9.3 LV SV:         50 LV SV Index:   34 LVOT Area:     2.84 cm  RIGHT VENTRICLE RV Basal diam:  2.84 cm RV S prime:     9.25 cm/s TAPSE (M-mode): 2.5 cm LEFT ATRIUM             Index       RIGHT ATRIUM          Index LA diam:        2.80 cm 1.88 cm/m  RA Area:     8.32 cm LA Vol (A2C):   23.7 ml 15.89 ml/m RA Volume:   13.80 ml 9.25 ml/m LA Vol (A4C):   19.7 ml 13.21 ml/m LA Biplane Vol: 22.8 ml 15.29 ml/m  AORTIC VALVE LVOT Vmax:   88.10 cm/s LVOT Vmean:  65.400 cm/s LVOT VTI:    0.177 m  AORTA Ao Root diam: 2.90 cm MITRAL VALVE               TRICUSPID VALVE MV Area (PHT): 3.81 cm    TR Peak grad:   22.8 mmHg MV Decel Time: 199 msec    TR Vmax:        239.00 cm/s MV E velocity: 70.70 cm/s MV A velocity: 66.40 cm/s  SHUNTS MV E/A ratio:  1.06        Systemic VTI:  0.18 m                            Systemic Diam: 1.90 cm Jenkins Rouge MD Electronically signed by Jenkins Rouge MD Signature Date/Time: 08/29/2020/9:35:46 AM    Final  Scheduled Meds: . baclofen  10 mg Oral TID  . escitalopram  10 mg Oral Daily  . pantoprazole  40 mg Oral BID AC  . topiramate  50 mg Oral BID    LOS: 0 days    Time spent: 35 minutes    Jazon Jipson Darleen Crocker, DO [facility]  If 7PM-7AM, please contact  night-coverage [email or web address] 08/30/2020, 2:29 PM

## 2020-08-31 ENCOUNTER — Observation Stay (HOSPITAL_COMMUNITY)
Admit: 2020-08-31 | Discharge: 2020-08-31 | Disposition: A | Discharge status: Home / Self Care | Payer: Medicare HMO | Attending: Internal Medicine | Admitting: Internal Medicine

## 2020-08-31 DIAGNOSIS — R4182 Altered mental status, unspecified: Secondary | ICD-10-CM

## 2020-08-31 MED ORDER — HYDROCORTISONE 1 % EX CREA
TOPICAL_CREAM | Freq: Four times a day (QID) | CUTANEOUS | Status: DC | PRN
  Administered 2020-08-31: 1 via TOPICAL
  Filled 2020-08-31: qty 28

## 2020-08-31 NOTE — Procedures (Signed)
Patient Name: Tiffany Stewart  MRN: [id number]  Epilepsy Attending: Lora Havens  Referring Physician/Provider: Dr Nicanor Bake Date: 08/31/2020 Duration: 24.58 mins  Patient history: 49yo F with ams. EEG to evaluate for seizure  Level of alertness: Awake  AEDs during EEG study: TPM  Technical aspects: This EEG study was done with scalp electrodes positioned according to the 10-20 International system of electrode placement. Electrical activity was acquired at a sampling rate of 500Hz  and reviewed with a high frequency filter of 70Hz  and a low frequency filter of 1Hz . EEG data were recorded continuously and digitally stored.   Description: The posterior dominant rhythm consists of 8-9 Hz activity of moderate voltage (25-35 uV) seen predominantly in posterior head regions, symmetric and reactive to eye opening and eye closing. Hyperventilation did not show any EEG change.  Physiologic photic driving was seen during photic stimulation.   IMPRESSION: This study is within normal limits. No seizures or epileptiform discharges were seen throughout the recording.  Tiffany Stewart

## 2020-08-31 NOTE — Progress Notes (Signed)
Notified daughter of patient discharge and asked if daughter could come and pick up patient.  Daughter Vito Backers stated that patient husband is main mode of transportation, and that she felt unsafe driving in current road conditions tonight.  Patient  Daughter did state that she could pick up patient at 0900-0930 in am.

## 2020-08-31 NOTE — Progress Notes (Signed)
EEG Completed; Results Pending  

## 2020-08-31 NOTE — Discharge Summary (Signed)
Physician Discharge Summary  ONI ORE [id number] DOB: 1970/08/30 DOA: 08/28/2020  PCP: Chevis Pretty, FNP  Admit date: 08/28/2020  Discharge date: 08/31/2020  Admitted From:Home  Disposition:  Home  Recommendations for Outpatient Follow-up:  1. Follow up with PCP in 1-2 weeks 2. Continue home medications as prior, EEG and MRI Brain without acute abnormalities  Home Health:Yes with PT  Equipment/Devices:None  Discharge Condition:Stable  CODE STATUS: Full  Diet recommendation: Heart Healthy  Brief/Interim Summary: Tiffany Kalp Brownis a 50 y.o.femalewith medical history significant forparaplegia, anxiety, depression, migraineandchronic pain who presents to the ED viaEMS due to fall and confusion.Patient was found in the bathroom incontinent and confused with garbled speech. CT head and CTA head and neck with no acute findings noted. MRI brain with and without contrast with no acute findings and routine EEG ordered per neurology noted to be negative. Carotid ultrasound without any acute findings of stenosis and 2D echocardiogram with LVEF 60 to 65% and no other acute findings noted. There is some concern for polypharmacy due to history of chronic pain and anxiety and multiple medications. She can continue these medications as prescribed and follow up with her pcp to make adjustments as needed. Patient is wheelchair-bound at home. PT recommending home health PT which will be arranged.  Discharge Diagnoses:  Principal Problem:   Altered mental status Active Problems:   Anxiety state   TOBACCO ABUSE   GERD   HSP (hereditary spastic paraplegia) (HCC)   Polypharmacy   Unwitnessed fall    Discharge Instructions   Allergies as of 08/31/2020      Reactions   Hctz [hydrochlorothiazide] Diarrhea   Morphine Other (See Comments)   Flushing, turns skin red    Zoloft [sertraline Hcl] Other (See Comments)   Spaced out      Medication List    TAKE these  medications   acetaminophen 500 MG tablet Commonly known as: TYLENOL Take 1,500 mg by mouth 2 (two) times daily as needed for headache.   ALPRAZolam 0.5 MG tablet Commonly known as: XANAX Take 1 tablet (0.5 mg total) by mouth 2 (two) times daily as needed. for anxiety   baclofen 10 MG tablet Commonly known as: LIORESAL Take 1 tablet (10 mg total) by mouth 2 (two) times daily. What changed: when to take this   diclofenac Sodium 1 % Gel Commonly known as: Voltaren Apply 4 g topically 4 (four) times daily. What changed:   when to take this  reasons to take this   escitalopram 10 MG tablet Commonly known as: LEXAPRO Take 1 tablet (10 mg total) by mouth daily.   GOODY HEADACHE PO Take 1 Package by mouth daily as needed.   pantoprazole 40 MG tablet Commonly known as: PROTONIX Take 1 tablet (40 mg total) by mouth 2 (two) times daily before a meal.   topiramate 50 MG tablet Commonly known as: TOPAMAX TAKE (1) TABLET TWICE DAILY. What changed:   how much to take  how to take this  when to take this  additional instructions       Follow-up Information    [facility] Follow up.   Specialty: Home Health Services Why: HHPT Contact information: [place] [address] [phone]        Chevis Pretty, FNP Follow up in 1 week(s).   Specialty: Family Medicine Contact information: [address] [place] [address] [phone]  Allergies  Allergen Reactions  . Hctz [Hydrochlorothiazide] Diarrhea  . Morphine Other (See Comments)    Flushing, turns skin red   . Zoloft [Sertraline Hcl] Other (See Comments)    Spaced out    Consultations:  Teleneurology   Procedures/Studies: CT Angio Head W or Wo Contrast  Result Date: 08/29/2020 CLINICAL DATA:  Encephalopathy EXAM: CT ANGIOGRAPHY HEAD AND NECK TECHNIQUE: Multidetector CT imaging of the head and neck was performed using the standard  protocol during bolus administration of intravenous contrast. Multiplanar CT image reconstructions and MIPs were obtained to evaluate the vascular anatomy. Carotid stenosis measurements (when applicable) are obtained utilizing NASCET criteria, using the distal internal carotid diameter as the denominator. CONTRAST:  46mL OMNIPAQUE IOHEXOL 350 MG/ML SOLN COMPARISON:  None. FINDINGS: CTA NECK FINDINGS SKELETON: There is no bony spinal canal stenosis. No lytic or blastic lesion. OTHER NECK: Normal pharynx, larynx and major salivary glands. No cervical lymphadenopathy. Unremarkable thyroid gland. UPPER CHEST: No pneumothorax or pleural effusion. No nodules or masses. AORTIC ARCH: There is no calcific atherosclerosis of the aortic arch. There is no aneurysm, dissection or hemodynamically significant stenosis of the visualized portion of the aorta. Conventional 3 vessel aortic branching pattern. The visualized proximal subclavian arteries are widely patent. RIGHT CAROTID SYSTEM: Normal without aneurysm, dissection or stenosis. LEFT CAROTID SYSTEM: Normal without aneurysm, dissection or stenosis. VERTEBRAL ARTERIES: Left dominant configuration. Both origins are clearly patent. There is no dissection, occlusion or flow-limiting stenosis to the skull base (V1-V3 segments). CTA HEAD FINDINGS POSTERIOR CIRCULATION: --Vertebral arteries: Normal V4 segments. --Inferior cerebellar arteries: Normal. --Basilar artery: Normal. --Superior cerebellar arteries: Normal. --Posterior cerebral arteries (PCA): Normal. ANTERIOR CIRCULATION: --Intracranial internal carotid arteries: Normal. --Anterior cerebral arteries (ACA): Normal. Both A1 segments are present. Patent anterior communicating artery (a-comm). --Middle cerebral arteries (MCA): Normal. VENOUS SINUSES: As permitted by contrast timing, patent. ANATOMIC VARIANTS: None Review of the MIP images confirms the above findings. IMPRESSION: Normal CTA of the head and neck. Electronically  Signed   By: Ulyses Jarred M.D.   On: 08/29/2020 03:30   CT HEAD WO CONTRAST  Result Date: 08/29/2020 CLINICAL DATA:  Mental status change EXAM: CT HEAD WITHOUT CONTRAST TECHNIQUE: Contiguous axial images were obtained from the base of the skull through the vertex without intravenous contrast. COMPARISON:  None. FINDINGS: Brain: No evidence of acute territorial infarction, hemorrhage, hydrocephalus,extra-axial collection or mass lesion/mass effect. Normal gray-white differentiation. Ventricles are normal in size and contour. Vascular: No hyperdense vessel or unexpected calcification. Skull: The skull is intact. No fracture or focal lesion identified. Sinuses/Orbits: The visualized paranasal sinuses and mastoid air cells are clear. The orbits and globes intact. Other: None IMPRESSION: No acute intracranial abnormality. Electronically Signed   By: Prudencio Pair M.D.   On: 08/29/2020 01:14   CT Angio Neck W and/or Wo Contrast  Result Date: 08/29/2020 CLINICAL DATA:  Encephalopathy EXAM: CT ANGIOGRAPHY HEAD AND NECK TECHNIQUE: Multidetector CT imaging of the head and neck was performed using the standard protocol during bolus administration of intravenous contrast. Multiplanar CT image reconstructions and MIPs were obtained to evaluate the vascular anatomy. Carotid stenosis measurements (when applicable) are obtained utilizing NASCET criteria, using the distal internal carotid diameter as the denominator. CONTRAST:  38mL OMNIPAQUE IOHEXOL 350 MG/ML SOLN COMPARISON:  None. FINDINGS: CTA NECK FINDINGS SKELETON: There is no bony spinal canal stenosis. No lytic or blastic lesion. OTHER NECK: Normal pharynx, larynx and major salivary glands. No cervical lymphadenopathy. Unremarkable thyroid gland. UPPER CHEST: No pneumothorax or pleural  effusion. No nodules or masses. AORTIC ARCH: There is no calcific atherosclerosis of the aortic arch. There is no aneurysm, dissection or hemodynamically significant stenosis of the  visualized portion of the aorta. Conventional 3 vessel aortic branching pattern. The visualized proximal subclavian arteries are widely patent. RIGHT CAROTID SYSTEM: Normal without aneurysm, dissection or stenosis. LEFT CAROTID SYSTEM: Normal without aneurysm, dissection or stenosis. VERTEBRAL ARTERIES: Left dominant configuration. Both origins are clearly patent. There is no dissection, occlusion or flow-limiting stenosis to the skull base (V1-V3 segments). CTA HEAD FINDINGS POSTERIOR CIRCULATION: --Vertebral arteries: Normal V4 segments. --Inferior cerebellar arteries: Normal. --Basilar artery: Normal. --Superior cerebellar arteries: Normal. --Posterior cerebral arteries (PCA): Normal. ANTERIOR CIRCULATION: --Intracranial internal carotid arteries: Normal. --Anterior cerebral arteries (ACA): Normal. Both A1 segments are present. Patent anterior communicating artery (a-comm). --Middle cerebral arteries (MCA): Normal. VENOUS SINUSES: As permitted by contrast timing, patent. ANATOMIC VARIANTS: None Review of the MIP images confirms the above findings. IMPRESSION: Normal CTA of the head and neck. Electronically Signed   By: Ulyses Jarred M.D.   On: 08/29/2020 03:30   MR BRAIN W WO CONTRAST  Result Date: 08/30/2020 CLINICAL DATA:  Neuro deficit, acute, stroke suspected. EXAM: MRI HEAD WITHOUT AND WITH CONTRAST TECHNIQUE: Multiplanar, multiecho pulse sequences of the brain and surrounding structures were obtained without and with intravenous contrast. CONTRAST:  30mL GADAVIST GADOBUTROL 1 MMOL/ML IV SOLN COMPARISON:  Head CT August 29, 2020. FINDINGS: The study is partially degraded by motion. Brain: No acute infarction, hemorrhage, hydrocephalus, extra-axial collection or mass lesion. Scattered foci of T2 hyperintensity within the white matter of the cerebral hemispheres, nonspecific, more pronounced in the periventricular white matter of the frontal horns of the lateral ventricles. Prominent decreased volume of  the corpus callosum and moderate cerebellar volume loss. Vascular: Normal flow voids. Skull and upper cervical spine: Normal marrow signal. Sinuses/Orbits: Negative. IMPRESSION: 1. Motion degraded study. 2. No acute intracranial abnormality. 3. T2 hyperintensity of the periventricular white matter of the frontal horns of the lateral ventricles, decreased volume of the corpus callosum and cerebellar hemispheres are likely related to patient's known hereditary spastic paraplegia. Electronically Signed   By: Pedro Earls M.D.   On: 08/30/2020 13:30   US Carotid Bilateral  Result Date: 08/29/2020 CLINICAL DATA:  50 year old with syncope. EXAM: BILATERAL CAROTID DUPLEX ULTRASOUND TECHNIQUE: Pearline Cables scale imaging, color Doppler and duplex ultrasound were performed of bilateral carotid and vertebral arteries in the neck. COMPARISON:  None. FINDINGS: Criteria: Quantification of carotid stenosis is based on velocity parameters that correlate the residual internal carotid diameter with NASCET-based stenosis levels, using the diameter of the distal internal carotid lumen as the denominator for stenosis measurement. The following velocity measurements were obtained: RIGHT ICA: 56/27 cm/sec CCA: 72/53 cm/sec SYSTOLIC ICA/CCA RATIO:  1.0 ECA: 38 cm/sec LEFT ICA: 78/40 cm/sec CCA: 66/44 cm/sec SYSTOLIC ICA/CCA RATIO:  1.1 ECA: 60 cm/sec RIGHT CAROTID ARTERY: Right carotid arteries are patent without significant plaque or stenosis. External carotid artery is patent with normal waveform. Normal waveforms and velocities in the internal carotid artery. RIGHT VERTEBRAL ARTERY: Antegrade flow and normal waveform in the right vertebral artery. LEFT CAROTID ARTERY: Small focus of echogenic plaque at the left carotid bulb. External carotid artery is patent with normal waveform. Normal waveforms and velocities in the internal carotid artery. LEFT VERTEBRAL ARTERY: Antegrade flow and normal waveform in the left vertebral  artery. IMPRESSION: 1. Right carotid arteries are patent without significant plaque or stenosis. 2. Small focus of plaque at  the left carotid bulb. No significant left carotid artery stenosis. Estimated degree of stenosis in left internal carotid artery is less than 50%. 3. Patent vertebral arteries with antegrade flow. Electronically Signed   By: Markus Daft M.D.   On: 08/29/2020 10:06   EEG adult  Result Date: 08/31/2020 Lora Havens, MD     08/31/2020  5:08 PM Patient Name: Tiffany Stewart MRN: [id number] Epilepsy Attending: Lora Havens Referring Physician/Provider: Dr Nicanor Bake Date: 08/31/2020 Duration: 24.58 mins Patient history: 49yo F with ams. EEG to evaluate for seizure Level of alertness: Awake AEDs during EEG study: TPM Technical aspects: This EEG study was done with scalp electrodes positioned according to the 10-20 International system of electrode placement. Electrical activity was acquired at a sampling rate of 500Hz  and reviewed with a high frequency filter of 70Hz  and a low frequency filter of 1Hz . EEG data were recorded continuously and digitally stored. Description: The posterior dominant rhythm consists of 8-9 Hz activity of moderate voltage (25-35 uV) seen predominantly in posterior head regions, symmetric and reactive to eye opening and eye closing. Hyperventilation did not show any EEG change.  Physiologic photic driving was seen during photic stimulation. IMPRESSION: This study is within normal limits. No seizures or epileptiform discharges were seen throughout the recording. Lora Havens   ECHOCARDIOGRAM COMPLETE  Result Date: 08/29/2020    ECHOCARDIOGRAM REPORT   Patient Name:   RAVINA MILNER Date of Exam: 08/29/2020 Medical Rec #:  [id number]    Height:       60.0 in Accession #:    [id number]   Weight:       117.9 lb Date of Birth:  04/09/1971    BSA:          1.491 m Patient Age:    50 years     BP:           143/90 mmHg Patient Gender: F            HR:           70  bpm. Exam Location:  [name] Procedure: 2D Echo, Cardiac Doppler and Color Doppler Indications:    Syncope  History:        Patient has no prior history of Echocardiogram examinations.                 Signs/Symptoms:Syncope and Hypotension; Risk Factors:Current                 Smoker. Paraplegic.  Sonographer:    Dustin Flock RDCS Referring Phys: [id number] Royanne Foots [place]  1. Left ventricular ejection fraction, by estimation, is 60 to 65%. The left ventricle has normal function. The left ventricle has no regional wall motion abnormalities. Left ventricular diastolic parameters were normal.  2. Right ventricular systolic function is normal. The right ventricular size is normal. There is normal pulmonary artery systolic pressure.  3. The mitral valve is normal in structure. Trivial mitral valve regurgitation. No evidence of mitral stenosis.  4. The aortic valve is tricuspid. Aortic valve regurgitation is not visualized. No aortic stenosis is present.  5. The inferior vena cava is normal in size with greater than 50% respiratory variability, suggesting right atrial pressure of 3 mmHg. FINDINGS  Left Ventricle: Left ventricular ejection fraction, by estimation, is 60 to 65%. The left ventricle has normal function. The left ventricle has no regional wall motion abnormalities. The left ventricular internal cavity size was normal in size. There  is  no left ventricular hypertrophy. Left ventricular diastolic parameters were normal. Right Ventricle: The right ventricular size is normal. No increase in right ventricular wall thickness. Right ventricular systolic function is normal. There is normal pulmonary artery systolic pressure. The tricuspid regurgitant velocity is 2.39 m/s, and  with an assumed right atrial pressure of 3 mmHg, the estimated right ventricular systolic pressure is 0000000 mmHg. Left Atrium: Left atrial size was normal in size. Right Atrium: Right atrial size was normal in size. Pericardium:  There is no evidence of pericardial effusion. Mitral Valve: The mitral valve is normal in structure. There is mild thickening of the mitral valve leaflet(s). There is mild calcification of the mitral valve leaflet(s). Trivial mitral valve regurgitation. No evidence of mitral valve stenosis. Tricuspid Valve: The tricuspid valve is normal in structure. Tricuspid valve regurgitation is trivial. No evidence of tricuspid stenosis. Aortic Valve: The aortic valve is tricuspid. Aortic valve regurgitation is not visualized. No aortic stenosis is present. Pulmonic Valve: The pulmonic valve was normal in structure. Pulmonic valve regurgitation is not visualized. No evidence of pulmonic stenosis. Aorta: The aortic root is normal in size and structure. Venous: The inferior vena cava is normal in size with greater than 50% respiratory variability, suggesting right atrial pressure of 3 mmHg. IAS/Shunts: No atrial level shunt detected by color flow Doppler.  LEFT VENTRICLE PLAX 2D LVIDd:         4.48 cm  Diastology LVIDs:         2.68 cm  LV e' medial:    7.72 cm/s LV PW:         0.99 cm  LV E/e' medial:  9.2 LV IVS:        0.92 cm  LV e' lateral:   7.62 cm/s LVOT diam:     1.90 cm  LV E/e' lateral: 9.3 LV SV:         50 LV SV Index:   34 LVOT Area:     2.84 cm  RIGHT VENTRICLE RV Basal diam:  2.84 cm RV S prime:     9.25 cm/s TAPSE (M-mode): 2.5 cm LEFT ATRIUM             Index       RIGHT ATRIUM          Index LA diam:        2.80 cm 1.88 cm/m  RA Area:     8.32 cm LA Vol (A2C):   23.7 ml 15.89 ml/m RA Volume:   13.80 ml 9.25 ml/m LA Vol (A4C):   19.7 ml 13.21 ml/m LA Biplane Vol: 22.8 ml 15.29 ml/m  AORTIC VALVE LVOT Vmax:   88.10 cm/s LVOT Vmean:  65.400 cm/s LVOT VTI:    0.177 m  AORTA Ao Root diam: 2.90 cm MITRAL VALVE               TRICUSPID VALVE MV Area (PHT): 3.81 cm    TR Peak grad:   22.8 mmHg MV Decel Time: 199 msec    TR Vmax:        239.00 cm/s MV E velocity: 70.70 cm/s MV A velocity: 66.40 cm/s  SHUNTS MV  E/A ratio:  1.06        Systemic VTI:  0.18 m                            Systemic Diam: 1.90 cm Jenkins Rouge MD Electronically signed by  Jenkins Rouge MD Signature Date/Time: 08/29/2020/9:35:46 AM    Final      Discharge Exam: Vitals:   08/31/20 1337 08/31/20 2201  BP: (!) 134/97 (!) 147/93  Pulse: 74 74  Resp: 17 18  Temp: 98 F (36.7 C) 98.3 F (36.8 C)  SpO2: 98% (!) 87%   Vitals:   08/30/20 2104 08/31/20 0509 08/31/20 1337 08/31/20 2201  BP: 123/83 115/79 (!) 134/97 (!) 147/93  Pulse: 85 81 74 74  Resp: 17 19 17 18   Temp: 98.6 F (37 C) 98.3 F (36.8 C) 98 F (36.7 C) 98.3 F (36.8 C)  TempSrc: Oral Oral Oral Oral  SpO2: 95% 96% 98% (!) 87%  Weight:      Height:        General: Pt is alert, awake, not in acute distress Cardiovascular: RRR, S1/S2 +, no rubs, no gallops Respiratory: CTA bilaterally, no wheezing, no rhonchi Abdominal: Soft, NT, ND, bowel sounds + Extremities: no edema, no cyanosis    The results of significant diagnostics from this hospitalization (including imaging, microbiology, ancillary and laboratory) are listed below for reference.     Microbiology: Recent Results (from the past 240 hour(s))  Resp Panel by RT-PCR (Flu A&B, Covid) Nasopharyngeal Swab     Status: None   Collection Time: 08/29/20  3:43 AM   Specimen: Nasopharyngeal Swab; Nasopharyngeal(NP) swabs in vial transport medium  Result Value Ref Range Status   SARS Coronavirus 2 by RT PCR NEGATIVE NEGATIVE Final    Comment: (NOTE) SARS-CoV-2 target nucleic acids are NOT DETECTED.  The SARS-CoV-2 RNA is generally detectable in upper respiratory specimens during the acute phase of infection. The lowest concentration of SARS-CoV-2 viral copies this assay can detect is 138 copies/mL. A negative result does not preclude SARS-Cov-2 infection and should not be used as the sole basis for treatment or other patient management decisions. A negative result may occur with  improper specimen  collection/handling, submission of specimen other than nasopharyngeal swab, presence of viral mutation(s) within the areas targeted by this assay, and inadequate number of viral copies(<138 copies/mL). A negative result must be combined with clinical observations, patient history, and epidemiological information. The expected result is Negative.  Fact Sheet for Patients:  EntrepreneurPulse.com.au  Fact Sheet for Healthcare Providers:  IncredibleEmployment.be  This test is no t yet approved or cleared by the [place] FDA and  has been authorized for detection and/or diagnosis of SARS-CoV-2 by FDA under an Emergency Use Authorization (EUA). This EUA will remain  in effect (meaning this test can be used) for the duration of the COVID-19 declaration under Section 564(b)(1) of the Act, 21 U.S.C.section 360bbb-3(b)(1), unless the authorization is terminated  or revoked sooner.       Influenza A by PCR NEGATIVE NEGATIVE Final   Influenza B by PCR NEGATIVE NEGATIVE Final    Comment: (NOTE) The Xpert Xpress SARS-CoV-2/FLU/RSV plus assay is intended as an aid in the diagnosis of influenza from Nasopharyngeal swab specimens and should not be used as a sole basis for treatment. Nasal washings and aspirates are unacceptable for Xpert Xpress SARS-CoV-2/FLU/RSV testing.  Fact Sheet for Patients: EntrepreneurPulse.com.au  Fact Sheet for Healthcare Providers: IncredibleEmployment.be  This test is not yet approved or cleared by the [place] FDA and has been authorized for detection and/or diagnosis of SARS-CoV-2 by FDA under an Emergency Use Authorization (EUA). This EUA will remain in effect (meaning this test can be used) for the duration of the COVID-19 declaration under Section 564(b)(1)  of the Act, 21 U.S.C. section 360bbb-3(b)(1), unless the authorization is terminated or revoked.  Performed at [facility], [address], [place], [place] [address]      Labs: BNP (last 3 results) No results for input(s): BNP in the last 8760 hours. Basic Metabolic Panel: Recent Labs  Lab 08/28/20 2348 08/30/20 0500  NA 143 139  K 4.1 3.6  CL 110 110  CO2 26 21*  GLUCOSE 112* 92  BUN 19 18  CREATININE 0.69 0.65  CALCIUM 9.5 8.7*  MG  --  2.0  PHOS  --  4.8*   Liver Function Tests: Recent Labs  Lab 08/28/20 2348 08/30/20 0500  AST 16 14*  ALT 15 13  ALKPHOS 64 54  BILITOT 0.3 0.4  PROT 8.1 6.7  ALBUMIN 4.4 3.8   No results for input(s): LIPASE, AMYLASE in the last 168 hours. No results for input(s): AMMONIA in the last 168 hours. CBC: Recent Labs  Lab 08/28/20 2348 08/30/20 0500  WBC 8.4 6.0  NEUTROABS 5.7  --   HGB 14.4 12.3  HCT 44.3 37.5  MCV 100.5* 100.5*  PLT 284 246   Cardiac Enzymes: No results for input(s): CKTOTAL, CKMB, CKMBINDEX, TROPONINI in the last 168 hours. BNP: Invalid input(s): POCBNP CBG: Recent Labs  Lab 08/28/20 2346  GLUCAP 98   D-Dimer No results for input(s): DDIMER in the last 72 hours. Hgb A1c No results for input(s): HGBA1C in the last 72 hours. Lipid Profile No results for input(s): CHOL, HDL, LDLCALC, TRIG, CHOLHDL, LDLDIRECT in the last 72 hours. Thyroid function studies No results for input(s): TSH, T4TOTAL, T3FREE, THYROIDAB in the last 72 hours.  Invalid input(s): FREET3 Anemia work up No results for input(s): VITAMINB12, FOLATE, FERRITIN, TIBC, IRON, RETICCTPCT in the last 72 hours. Urinalysis    Component Value Date/Time   COLORURINE YELLOW 08/29/2020 0039   APPEARANCEUR HAZY (A) 08/29/2020 0039   APPEARANCEUR Clear 03/07/2016 1046   LABSPEC 1.016 08/29/2020 0039   PHURINE 8.0 08/29/2020 0039   GLUCOSEU NEGATIVE 08/29/2020 0039   HGBUR NEGATIVE 08/29/2020 0039   BILIRUBINUR NEGATIVE 08/29/2020 0039   BILIRUBINUR Negative 03/07/2016 [place] 08/29/2020 0039   PROTEINUR NEGATIVE 08/29/2020 0039    UROBILINOGEN 0.2 03/12/2010 2120   NITRITE NEGATIVE 08/29/2020 0039   LEUKOCYTESUR NEGATIVE 08/29/2020 0039   Sepsis Labs Invalid input(s): PROCALCITONIN,  WBC,  LACTICIDVEN Microbiology Recent Results (from the past 240 hour(s))  Resp Panel by RT-PCR (Flu A&B, Covid) Nasopharyngeal Swab     Status: None   Collection Time: 08/29/20  3:43 AM   Specimen: Nasopharyngeal Swab; Nasopharyngeal(NP) swabs in vial transport medium  Result Value Ref Range Status   SARS Coronavirus 2 by RT PCR NEGATIVE NEGATIVE Final    Comment: (NOTE) SARS-CoV-2 target nucleic acids are NOT DETECTED.  The SARS-CoV-2 RNA is generally detectable in upper respiratory specimens during the acute phase of infection. The lowest concentration of SARS-CoV-2 viral copies this assay can detect is 138 copies/mL. A negative result does not preclude SARS-Cov-2 infection and should not be used as the sole basis for treatment or other patient management decisions. A negative result may occur with  improper specimen collection/handling, submission of specimen other than nasopharyngeal swab, presence of viral mutation(s) within the areas targeted by this assay, and inadequate number of viral copies(<138 copies/mL). A negative result must be combined with clinical observations, patient history, and epidemiological information. The expected result is Negative.  Fact Sheet for Patients:  EntrepreneurPulse.com.au  Fact Sheet for Healthcare Providers:  IncredibleEmployment.be  This test is no t yet approved or cleared by the [place] FDA and  has been authorized for detection and/or diagnosis of SARS-CoV-2 by FDA under an Emergency Use Authorization (EUA). This EUA will remain  in effect (meaning this test can be used) for the duration of the COVID-19 declaration under Section 564(b)(1) of the Act, 21 U.S.C.section 360bbb-3(b)(1), unless the authorization is terminated  or revoked  sooner.       Influenza A by PCR NEGATIVE NEGATIVE Final   Influenza B by PCR NEGATIVE NEGATIVE Final    Comment: (NOTE) The Xpert Xpress SARS-CoV-2/FLU/RSV plus assay is intended as an aid in the diagnosis of influenza from Nasopharyngeal swab specimens and should not be used as a sole basis for treatment. Nasal washings and aspirates are unacceptable for Xpert Xpress SARS-CoV-2/FLU/RSV testing.  Fact Sheet for Patients: EntrepreneurPulse.com.au  Fact Sheet for Healthcare Providers: IncredibleEmployment.be  This test is not yet approved or cleared by the [place] FDA and has been authorized for detection and/or diagnosis of SARS-CoV-2 by FDA under an Emergency Use Authorization (EUA). This EUA will remain in effect (meaning this test can be used) for the duration of the COVID-19 declaration under Section 564(b)(1) of the Act, 21 U.S.C. section 360bbb-3(b)(1), unless the authorization is terminated or revoked.  Performed at [facility], [address], [place], [place] [address]      Time coordinating discharge: 35 minutes  SIGNED:   Rodena Goldmann, DO [facility] 08/31/2020, 10:01 PM  If 7PM-7AM, please contact night-coverage [email or web address]

## 2020-08-31 NOTE — Plan of Care (Signed)
  Problem: Education: Goal: Knowledge of General Education information will improve Description: Including pain rating scale, medication(s)/side effects and non-pharmacologic comfort measures Outcome: Progressing   Problem: Health Behavior/Discharge Planning: Goal: Ability to manage health-related needs will improve Outcome: Progressing   Problem: Clinical Measurements: Goal: Ability to maintain clinical measurements within normal limits will improve Outcome: Progressing Goal: Will remain free from infection Outcome: Progressing Goal: Diagnostic test results will improve Outcome: Progressing Goal: Respiratory complications will improve Outcome: Progressing Goal: Cardiovascular complication will be avoided Outcome: Progressing   Problem: Activity: Goal: Risk for activity intolerance will decrease Outcome: Progressing   Problem: Nutrition: Goal: Adequate nutrition will be maintained Outcome: Progressing   Problem: Coping: Goal: Level of anxiety will decrease Outcome: Progressing   Problem: Elimination: Goal: Will not experience complications related to bowel motility Outcome: Progressing Goal: Will not experience complications related to urinary retention Outcome: Progressing   Problem: Safety: Goal: Ability to remain free from injury will improve Outcome: Progressing   Problem: Skin Integrity: Goal: Risk for impaired skin integrity will decrease Outcome: Progressing   Problem: Education: Goal: Knowledge of disease or condition will improve Outcome: Progressing Goal: Knowledge of secondary prevention will improve Outcome: Progressing Goal: Knowledge of patient specific risk factors addressed and post discharge goals established will improve Outcome: Progressing

## 2020-09-01 ENCOUNTER — Telehealth: Payer: Self-pay | Admitting: *Deleted

## 2020-09-01 DIAGNOSIS — R4182 Altered mental status, unspecified: Secondary | ICD-10-CM | POA: Diagnosis not present

## 2020-09-01 NOTE — Progress Notes (Signed)
Discharge instructions reviewed with patient and patients daughter.  Both verbalized understanding of instructions.  Patient discharged home with daughter in stable condition. 

## 2020-09-01 NOTE — Telephone Encounter (Signed)
Transition Care Management Unsuccessful Follow-up Telephone Call  Date of discharge and from where:  08/31/2020 [name]  Attempts:  1st Attempt  Reason for unsuccessful TCM follow-up call:  Left voice message-Cell and Unable to leave message on home number

## 2020-09-02 NOTE — Telephone Encounter (Signed)
Transition Care Management Unsuccessful Follow-up Telephone Call  Date of discharge and from where:  08/31/2020  [name]  Attempts:  2nd Attempt  Reason for unsuccessful TCM follow-up call:  Left voice message

## 2020-09-10 ENCOUNTER — Telehealth: Payer: Self-pay | Admitting: Nurse Practitioner

## 2020-09-10 NOTE — Telephone Encounter (Signed)
Patient to call back with contact information.  Number given was disconnected

## 2020-09-10 NOTE — Telephone Encounter (Signed)
Pt needs prescription/summary of visit for braces sent to [facility]

## 2020-09-13 DIAGNOSIS — F32A Depression, unspecified: Secondary | ICD-10-CM | POA: Diagnosis not present

## 2020-09-13 DIAGNOSIS — G43009 Migraine without aura, not intractable, without status migrainosus: Secondary | ICD-10-CM | POA: Diagnosis not present

## 2020-09-13 DIAGNOSIS — K219 Gastro-esophageal reflux disease without esophagitis: Secondary | ICD-10-CM | POA: Diagnosis not present

## 2020-09-13 DIAGNOSIS — G8929 Other chronic pain: Secondary | ICD-10-CM | POA: Diagnosis not present

## 2020-09-13 DIAGNOSIS — F419 Anxiety disorder, unspecified: Secondary | ICD-10-CM | POA: Diagnosis not present

## 2020-09-13 DIAGNOSIS — R4182 Altered mental status, unspecified: Secondary | ICD-10-CM | POA: Diagnosis not present

## 2020-09-13 DIAGNOSIS — K279 Peptic ulcer, site unspecified, unspecified as acute or chronic, without hemorrhage or perforation: Secondary | ICD-10-CM | POA: Diagnosis not present

## 2020-09-13 DIAGNOSIS — G114 Hereditary spastic paraplegia: Secondary | ICD-10-CM | POA: Diagnosis not present

## 2020-09-13 DIAGNOSIS — F17210 Nicotine dependence, cigarettes, uncomplicated: Secondary | ICD-10-CM | POA: Diagnosis not present

## 2020-09-14 ENCOUNTER — Inpatient Hospital Stay: Payer: Medicare HMO | Admitting: Nurse Practitioner

## 2020-09-22 ENCOUNTER — Telehealth: Payer: Self-pay | Admitting: Nurse Practitioner

## 2020-09-22 NOTE — Telephone Encounter (Signed)
TC to [place] notes on 08/03/20 have noted that both feet are turned inward but not that orthotics are needed. Can the notes be addended and resent

## 2020-09-22 NOTE — Telephone Encounter (Signed)
Pt calling back to check on summary needed sent to [facility].   She stated that the reason she was unable to answer/number said disconnected when someone tried to reach out 1/28 was because she was out of minutes on her phone but that the [phone] is her best contact #

## 2020-09-24 ENCOUNTER — Ambulatory Visit: Payer: Medicare HMO | Admitting: Nurse Practitioner

## 2020-09-29 DIAGNOSIS — F32A Depression, unspecified: Secondary | ICD-10-CM | POA: Diagnosis not present

## 2020-09-29 DIAGNOSIS — G8929 Other chronic pain: Secondary | ICD-10-CM | POA: Diagnosis not present

## 2020-09-29 DIAGNOSIS — F17210 Nicotine dependence, cigarettes, uncomplicated: Secondary | ICD-10-CM | POA: Diagnosis not present

## 2020-09-29 DIAGNOSIS — R4182 Altered mental status, unspecified: Secondary | ICD-10-CM | POA: Diagnosis not present

## 2020-09-29 DIAGNOSIS — K219 Gastro-esophageal reflux disease without esophagitis: Secondary | ICD-10-CM | POA: Diagnosis not present

## 2020-09-29 DIAGNOSIS — K279 Peptic ulcer, site unspecified, unspecified as acute or chronic, without hemorrhage or perforation: Secondary | ICD-10-CM | POA: Diagnosis not present

## 2020-09-29 DIAGNOSIS — G43009 Migraine without aura, not intractable, without status migrainosus: Secondary | ICD-10-CM | POA: Diagnosis not present

## 2020-09-29 DIAGNOSIS — G114 Hereditary spastic paraplegia: Secondary | ICD-10-CM | POA: Diagnosis not present

## 2020-09-29 DIAGNOSIS — F419 Anxiety disorder, unspecified: Secondary | ICD-10-CM | POA: Diagnosis not present

## 2020-10-06 DIAGNOSIS — F419 Anxiety disorder, unspecified: Secondary | ICD-10-CM | POA: Diagnosis not present

## 2020-10-06 DIAGNOSIS — K279 Peptic ulcer, site unspecified, unspecified as acute or chronic, without hemorrhage or perforation: Secondary | ICD-10-CM | POA: Diagnosis not present

## 2020-10-06 DIAGNOSIS — G8929 Other chronic pain: Secondary | ICD-10-CM | POA: Diagnosis not present

## 2020-10-06 DIAGNOSIS — K219 Gastro-esophageal reflux disease without esophagitis: Secondary | ICD-10-CM | POA: Diagnosis not present

## 2020-10-06 DIAGNOSIS — G114 Hereditary spastic paraplegia: Secondary | ICD-10-CM | POA: Diagnosis not present

## 2020-10-06 DIAGNOSIS — R4182 Altered mental status, unspecified: Secondary | ICD-10-CM | POA: Diagnosis not present

## 2020-10-06 DIAGNOSIS — G43009 Migraine without aura, not intractable, without status migrainosus: Secondary | ICD-10-CM | POA: Diagnosis not present

## 2020-10-06 DIAGNOSIS — F32A Depression, unspecified: Secondary | ICD-10-CM | POA: Diagnosis not present

## 2020-10-06 DIAGNOSIS — F17210 Nicotine dependence, cigarettes, uncomplicated: Secondary | ICD-10-CM | POA: Diagnosis not present

## 2020-10-07 ENCOUNTER — Other Ambulatory Visit: Payer: Self-pay

## 2020-10-07 ENCOUNTER — Ambulatory Visit (INDEPENDENT_AMBULATORY_CARE_PROVIDER_SITE_OTHER): Payer: Medicare HMO

## 2020-10-07 DIAGNOSIS — G43009 Migraine without aura, not intractable, without status migrainosus: Secondary | ICD-10-CM

## 2020-10-07 DIAGNOSIS — K219 Gastro-esophageal reflux disease without esophagitis: Secondary | ICD-10-CM

## 2020-10-07 DIAGNOSIS — F419 Anxiety disorder, unspecified: Secondary | ICD-10-CM

## 2020-10-07 DIAGNOSIS — F17210 Nicotine dependence, cigarettes, uncomplicated: Secondary | ICD-10-CM

## 2020-10-07 DIAGNOSIS — F32A Depression, unspecified: Secondary | ICD-10-CM | POA: Diagnosis not present

## 2020-10-07 DIAGNOSIS — Z9181 History of falling: Secondary | ICD-10-CM

## 2020-10-07 DIAGNOSIS — G8929 Other chronic pain: Secondary | ICD-10-CM

## 2020-10-07 DIAGNOSIS — K279 Peptic ulcer, site unspecified, unspecified as acute or chronic, without hemorrhage or perforation: Secondary | ICD-10-CM

## 2020-10-07 DIAGNOSIS — G114 Hereditary spastic paraplegia: Secondary | ICD-10-CM

## 2020-10-07 DIAGNOSIS — R4182 Altered mental status, unspecified: Secondary | ICD-10-CM

## 2020-10-07 DIAGNOSIS — Z993 Dependence on wheelchair: Secondary | ICD-10-CM

## 2020-10-07 DIAGNOSIS — W19XXXD Unspecified fall, subsequent encounter: Secondary | ICD-10-CM

## 2020-10-11 ENCOUNTER — Ambulatory Visit: Payer: Medicare HMO | Admitting: Nurse Practitioner

## 2020-10-13 DIAGNOSIS — K219 Gastro-esophageal reflux disease without esophagitis: Secondary | ICD-10-CM | POA: Diagnosis not present

## 2020-10-13 DIAGNOSIS — G114 Hereditary spastic paraplegia: Secondary | ICD-10-CM | POA: Diagnosis not present

## 2020-10-13 DIAGNOSIS — G8929 Other chronic pain: Secondary | ICD-10-CM | POA: Diagnosis not present

## 2020-10-13 DIAGNOSIS — F419 Anxiety disorder, unspecified: Secondary | ICD-10-CM | POA: Diagnosis not present

## 2020-10-13 DIAGNOSIS — F17210 Nicotine dependence, cigarettes, uncomplicated: Secondary | ICD-10-CM | POA: Diagnosis not present

## 2020-10-13 DIAGNOSIS — F32A Depression, unspecified: Secondary | ICD-10-CM | POA: Diagnosis not present

## 2020-10-13 DIAGNOSIS — G43009 Migraine without aura, not intractable, without status migrainosus: Secondary | ICD-10-CM | POA: Diagnosis not present

## 2020-10-13 DIAGNOSIS — R4182 Altered mental status, unspecified: Secondary | ICD-10-CM | POA: Diagnosis not present

## 2020-10-13 DIAGNOSIS — K279 Peptic ulcer, site unspecified, unspecified as acute or chronic, without hemorrhage or perforation: Secondary | ICD-10-CM | POA: Diagnosis not present

## 2020-10-14 ENCOUNTER — Telehealth: Payer: Self-pay | Admitting: Nurse Practitioner

## 2020-10-14 NOTE — Telephone Encounter (Signed)
Pt would like to talk to someone about braces being denied. Tiffany Stewart that she needs them because her foot is turning in.

## 2020-10-18 ENCOUNTER — Ambulatory Visit: Payer: Medicare HMO

## 2020-10-18 ENCOUNTER — Ambulatory Visit (INDEPENDENT_AMBULATORY_CARE_PROVIDER_SITE_OTHER): Payer: Medicare HMO

## 2020-10-18 DIAGNOSIS — Z0000 Encounter for general adult medical examination without abnormal findings: Secondary | ICD-10-CM | POA: Diagnosis not present

## 2020-10-18 NOTE — Progress Notes (Signed)
MEDICARE ANNUAL WELLNESS VISIT  10/18/2020  Telephone Visit Disclaimer This Medicare AWV was conducted by telephone due to national recommendations for restrictions regarding the COVID-19 Pandemic (e.g. social distancing).  I verified, using two identifiers, that I am speaking with Tiffany Stewart or their authorized healthcare agent. I discussed the limitations, risks, security, and privacy concerns of performing an evaluation and management service by telephone and the potential availability of an in-person appointment in the future. The patient expressed understanding and agreed to proceed.  Location of Patient: Home Location of Provider (nurse):  [facility]  Subjective:    Tiffany Stewart is a 50 y.o. female patient of Tiffany Stewart, [place] who had a Medicare Annual Wellness Visit today via telephone. Day is Disabled and lives with their daughter and son in [place]. She has three children. She reports that she is socially active and does interact with friends/family regularly. She is minimally physically active and enjoys working puzzles, doing crafts and shopping.  Patient Care Team: Tiffany Pretty, FNP as PCP - General (Nurse Practitioner) Tiffany Binder, MD (Inactive) as Consulting Physician (Gastroenterology)  Advanced Directives 10/18/2020 08/29/2020 08/28/2020 10/13/2019 04/05/2018 04/05/2018 03/23/2018  Does Patient Have a Medical Advance Directive? No No Unable to assess, patient is non-responsive or altered mental status No No No No  Type of Advance Directive - - - - - - -  Copy of Healthcare Power of Attorney in Chart? - - - - - - -  Would patient like information on creating a medical advance directive? No - Guardian declined No - Patient declined - No - Patient declined No - Patient declined No - Patient declined No - Patient declined    Hospital Utilization Over the Past 12 Months: # of hospitalizations or ER visits: 1 # of surgeries: 0  Review of Systems    Patient reports  that her overall health is unchanged compared to last year.  History obtained from chart review and the patient  Patient Reported Readings (BP, Pulse, CBG, Weight, etc) none  Pain Assessment Pain : 0-10 Pain Score: 9  Pain Type: Chronic pain Pain Location: Leg Pain Orientation: Lower Pain Descriptors / Indicators: Constant,Aching,Stabbing Pain Onset: More than a month ago Pain Frequency: Constant Pain Relieving Factors: Pain medication  Pain Relieving Factors: Pain medication  Current Medications & Allergies (verified) Allergies as of 10/18/2020      Reactions   Hctz [hydrochlorothiazide] Diarrhea   Morphine Other (See Comments)   Flushing, turns skin red    Zoloft [sertraline Hcl] Other (See Comments)   Spaced out      Medication List       Accurate as of October 18, 2020  8:31 AM. If you have any questions, ask your nurse or doctor.        acetaminophen 500 MG tablet Commonly known as: TYLENOL Take 1,500 mg by mouth 2 (two) times daily as needed for headache.   ALPRAZolam 0.5 MG tablet Commonly known as: XANAX Take 1 tablet (0.5 mg total) by mouth 2 (two) times daily as needed. for anxiety   baclofen 10 MG tablet Commonly known as: LIORESAL Take 1 tablet (10 mg total) by mouth 2 (two) times daily. What changed: when to take this   diclofenac Sodium 1 % Gel Commonly known as: Voltaren Apply 4 g topically 4 (four) times daily. What changed:   when to take this  reasons to take this   escitalopram 10 MG tablet Commonly known as: LEXAPRO Take 1  tablet (10 mg total) by mouth daily.   GOODY HEADACHE PO Take 1 Package by mouth daily as needed.   pantoprazole 40 MG tablet Commonly known as: PROTONIX Take 1 tablet (40 mg total) by mouth 2 (two) times daily before a meal.   topiramate 50 MG tablet Commonly known as: TOPAMAX TAKE (1) TABLET TWICE DAILY. What changed:   how much to take  how to take this  when to take this  additional instructions        History (reviewed): Past Medical History:  Diagnosis Date  . Anxiety   . Chronic pain   . Depression   . GERD (gastroesophageal reflux disease)   . 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 Migraines   . Perforated peptic ulcer ([place]) 03/17/2018   Goody powder overuse   Past Surgical History:  Procedure Laterality Date  . CESAREAN SECTION    . CHOLECYSTECTOMY    . ENDOMETRIAL ABLATION  2000  . GASTRORRHAPHY  03/17/2018   Procedure: GASTRORRHAPHY;  Surgeon: Tiffany Signs, MD;  Location: [facility];  Service: General;;  . HERNIA REPAIR    . LAPAROTOMY N/A 03/17/2018   Procedure: EXPLORATORY LAPAROTOMY;  Surgeon: Tiffany Signs, MD;  Location: [facility];  Service: General;  Laterality: N/A;   Family History  Problem Relation Age of Onset  . Diabetes Father   . Cancer Father        lung cancer  . Heart disease Father   . Heart disease Maternal Grandmother   . Stroke Maternal Grandfather   . Diabetes Paternal Grandmother   . Colon cancer Neg Hx    Social History   Socioeconomic History  . Marital status: Single    Spouse name: Not on file  . Number of children: 3  . Years of education: 10  . Highest education level: 10th grade  Occupational History  . Occupation: disabled  Tobacco Use  . Smoking status: Current Every Day Smoker    Packs/day: 0.25    Years: 27.00    Pack years: 6.75    Types: Cigarettes  . Smokeless tobacco: Never Used  Vaping Use  . Vaping Use: Some days  Substance and Sexual Activity  . Alcohol use: No  . Drug use: No  . Sexual activity: Not Currently    Birth control/protection: None  Other Topics Concern  . Not on file  Social History Narrative  . Not on file   Social Determinants of Health   Financial Resource Strain: Not on file  Food Insecurity: Not on file  Transportation Needs: Not on file  Physical Activity: Not on file  Stress: Not on file  Social Connections: Not on file    Activities of Daily Living In your present  state of health, do you have any difficulty performing the following activities: 10/18/2020 08/29/2020  Hearing? N N  Vision? N N  Difficulty concentrating or making decisions? N N  Walking or climbing stairs? Tiffany Stewart  Comment patient is disabled and is in a wheelchair -  Dressing or bathing? Y Y  Doing errands, shopping? Y N  Preparing Food and eating ? N -  Using the Toilet? N -  In the past six months, have you accidently leaked urine? N -  Do you have problems with loss of bowel control? N -  Managing your Medications? N -  Managing your Finances? N -  Housekeeping or managing your Housekeeping? Y -  Some recent data might be hidden  Patient is  unable to walk or climb stairs and is in a wheelchair.  She does require some assistance with bathing, running errands, shopping and housekeeping because of this.  Patient Education/ Literacy How often do you need to have someone help you when you read instructions, pamphlets, or other written materials from your doctor or pharmacy?: 1 - Never What is the last grade level you completed in school?: 10th grade  Exercise Current Exercise Habits: The patient does not participate in regular exercise at present, Exercise limited by: neurologic condition(s)  Diet Patient reports consuming 2 meals a day and 3 snack(s) a day Patient reports that her primary diet is: Regular Patient reports that she does have regular access to food.   Depression Screen PHQ 2/9 Scores 10/18/2020 08/03/2020 04/09/2020 10/13/2019 05/21/2018 01/28/2018 11/02/2017  PHQ - 2 Score 0 0 0 1 2 0 0  PHQ- 9 Score - - - - 8 - 0     Fall Risk Fall Risk  10/18/2020 08/03/2020 04/09/2020 10/13/2019 05/21/2018  Falls in the past year? 0 0 0 0 Yes  Comment - - - - -  Number falls in past yr: - - - - 1  Injury with Fall? - - - - No  Risk for fall due to : - - - Impaired mobility -  Follow up Falls evaluation completed - - - -     Objective:  Tiffany Stewart seemed alert and oriented and she  participated appropriately during our telephone visit.  Blood Pressure Weight BMI  BP Readings from Last 3 Encounters:  09/01/20 (!) 142/90  08/03/20 (!) 148/86  04/09/20 123/89   Wt Readings from Last 3 Encounters:  08/29/20 117 lb 15.1 oz (53.5 kg)  01/05/20 118 lb (53.5 kg)  04/05/18 119 lb 4.3 oz (54.1 kg)   BMI Readings from Last 1 Encounters:  08/29/20 23.03 kg/m    *Unable to obtain current vital Stewart, weight, and BMI due to telephone visit type  Hearing/Vision  . Tiffany Stewart did not seem to have difficulty with hearing/understanding during the telephone conversation . Reports that she has had a formal eye exam by an eye care professional within the past year . Reports that she has not had a formal hearing evaluation within the past year *Unable to fully assess hearing and vision during telephone visit type  Cognitive Function: 6CIT Screen 10/18/2020 10/13/2019  What Year? 0 points 0 points  What month? 0 points 0 points  What time? 0 points 0 points  Count back from 20 0 points 0 points  Months in reverse 0 points 0 points  Repeat phrase 0 points 0 points  Total Score 0 0   (Normal:0-7, Significant for Dysfunction: >8)  Normal Cognitive Function Screening: Yes   Immunization & Health Maintenance Record  There is no immunization history on file for this patient.  Health Maintenance  Topic Date Due  . COVID-19 Vaccine (1) Never done  . PAP SMEAR-Modifier  Never done  . COLONOSCOPY (Pts 45-70yrs Insurance coverage will need to be confirmed)  Never done  . INFLUENZA VACCINE  11/11/2020 (Originally 03/14/2020)  . Hepatitis C Screening  08/03/2021 (Originally 01-30-1971)  . TETANUS/TDAP  11/13/2022  . HIV Screening  Completed  . HPV VACCINES  Aged Out       Assessment  This is a routine wellness examination for Tiffany Stewart.  Health Maintenance: Due or Overdue Health Maintenance Due  Topic Date Due  . COVID-19 Vaccine (1) Never done  . PAP  SMEAR-Modifier  Never done   . COLONOSCOPY (Pts 45-30yrs Insurance coverage will need to be confirmed)  Never done    Tiffany Stewart does not need a referral for Community Assistance: Care Management:   no Social Work:    no Prescription Assistance:  no Nutrition/Diabetes Education:  no   Plan:  Personalized Goals Goals Addressed            This Visit's Progress   . Patient Stated       10/18/2020 AWV Goal: Improved Nutrition/Diet  . Patient will verbalize understanding that diet plays an important role in overall health and that a poor diet is a risk factor for many chronic medical conditions.  . Over the next year, patient will improve self management of their diet by incorporating fewer sweetened foods & beverages and better food choices. . Patient will utilize available community resources to help with food acquisition if needed (ex: food pantries, [address], etc) . Patient will work with nutrition specialist if a referral was made       Personalized Health Maintenance & Screening Recommendations  Td vaccine Screening Pap smear and pelvic exam  Colorectal cancer screening  Lung Cancer Screening Recommended: yes (Low Dose CT Chest recommended if Age 80-80 years, 30 pack-year currently smoking OR have quit w/in past 15 years) Hepatitis C Screening recommended: no HIV Screening recommended: no  Advanced Directives: Written information was not prepared per patient's request.  Referrals & Orders No orders of the defined types were placed in this encounter.   Follow-up Plan . Follow-up with Tiffany Pretty, FNP as planned     I have personally reviewed and noted the following in the patient's chart:   . Medical and social history . Use of alcohol, tobacco or illicit drugs  . Current medications and supplements . Functional ability and status . Nutritional status . Physical activity . Advanced directives . List of other physicians . Hospitalizations, surgeries, and ER visits in previous  12 months . Vitals . Screenings to include cognitive, depression, and falls . Referrals and appointments  In addition, I have reviewed and discussed with Tiffany Stewart certain preventive protocols, quality metrics, and best practice recommendations. A written personalized care plan for preventive services as well as general preventive health recommendations is available and can be mailed to the patient at her request.      Burnadette Pop  10/18/2020

## 2020-10-20 ENCOUNTER — Ambulatory Visit: Payer: Medicare HMO | Admitting: Family Medicine

## 2020-10-22 ENCOUNTER — Other Ambulatory Visit: Payer: Self-pay

## 2020-10-22 ENCOUNTER — Ambulatory Visit (INDEPENDENT_AMBULATORY_CARE_PROVIDER_SITE_OTHER): Payer: Medicare HMO | Admitting: Nurse Practitioner

## 2020-10-22 ENCOUNTER — Encounter: Payer: Self-pay | Admitting: Nurse Practitioner

## 2020-10-22 VITALS — BP 157/104 | HR 85 | Temp 97.7°F | Resp 20

## 2020-10-22 DIAGNOSIS — G114 Hereditary spastic paraplegia: Secondary | ICD-10-CM

## 2020-10-22 DIAGNOSIS — M79605 Pain in left leg: Secondary | ICD-10-CM

## 2020-10-22 DIAGNOSIS — R6889 Other general symptoms and signs: Secondary | ICD-10-CM | POA: Diagnosis not present

## 2020-10-22 DIAGNOSIS — M79604 Pain in right leg: Secondary | ICD-10-CM | POA: Diagnosis not present

## 2020-10-22 NOTE — Progress Notes (Addendum)
   Subjective:    Patient ID: Tiffany Stewart, female    DOB: [birth year], 50 y.o.   MRN: [id number]   Chief Complaint: Leg Swelling and Joint Swelling   HPI Patient comes in stating that her ankles were swollen all last week. They have gone down this week. While they were swollen her pain increased in them. Since they have gone down the pain has not improved. Rates pain 11/10 today. She is on baclofen BID. She was in the hospital and they were giving it to her 3x a day. She has a history of [facility] pharmacy and is on xanax and baclofen already.   Review of Systems  Constitutional: Negative for diaphoresis.  Eyes: Negative for pain.  Respiratory: Negative for shortness of breath.   Cardiovascular: Negative for chest pain, palpitations and leg swelling.  Gastrointestinal: Negative for abdominal pain.  Endocrine: Negative for polydipsia.  Skin: Negative for rash.  Neurological: Negative for dizziness, weakness and headaches.  Hematological: Does not bruise/bleed easily.  All other systems reviewed and are negative.      Objective:   Physical Exam Vitals and nursing note reviewed.  Cardiovascular:     Rate and Rhythm: Normal rate and regular rhythm.     Heart sounds: Normal heart sounds.  Pulmonary:     Breath sounds: Normal breath sounds.  Musculoskeletal:     Comments: Inversion of bil feet  Neurological:     General: No focal deficit present.     Mental Status: She is alert.  Psychiatric:        Mood and Affect: Mood normal.        Behavior: Behavior normal.     BP (!) 157/104   Pulse 85   Temp 97.7 F (36.5 C) (Temporal)   Resp 20   SpO2 96%        Assessment & Plan:  [name] in today with chief complaint of Leg Swelling and Joint Swelling   1. Bilateral leg pain - Ambulatory referral to Pain Clinic  2. 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 She will have to get from pain management  The above assessment and management plan was discussed with the patient. The patient verbalized understanding of and has agreed to the management plan. Patient is aware to call the clinic if symptoms persist or worsen. Patient is aware when to return to the clinic for a follow-up visit. Patient educated on when it is appropriate to go to the emergency department.   Mary-Margaret Hassell Done, FNP

## 2020-10-22 NOTE — Telephone Encounter (Signed)
Encounter closed, pt has appt today

## 2020-10-28 DIAGNOSIS — F32A Depression, unspecified: Secondary | ICD-10-CM | POA: Diagnosis not present

## 2020-10-28 DIAGNOSIS — G8929 Other chronic pain: Secondary | ICD-10-CM | POA: Diagnosis not present

## 2020-10-28 DIAGNOSIS — F17210 Nicotine dependence, cigarettes, uncomplicated: Secondary | ICD-10-CM | POA: Diagnosis not present

## 2020-10-28 DIAGNOSIS — K219 Gastro-esophageal reflux disease without esophagitis: Secondary | ICD-10-CM | POA: Diagnosis not present

## 2020-10-28 DIAGNOSIS — G114 Hereditary spastic paraplegia: Secondary | ICD-10-CM | POA: Diagnosis not present

## 2020-10-28 DIAGNOSIS — R4182 Altered mental status, unspecified: Secondary | ICD-10-CM | POA: Diagnosis not present

## 2020-10-28 DIAGNOSIS — K279 Peptic ulcer, site unspecified, unspecified as acute or chronic, without hemorrhage or perforation: Secondary | ICD-10-CM | POA: Diagnosis not present

## 2020-10-28 DIAGNOSIS — G43009 Migraine without aura, not intractable, without status migrainosus: Secondary | ICD-10-CM | POA: Diagnosis not present

## 2020-10-28 DIAGNOSIS — F419 Anxiety disorder, unspecified: Secondary | ICD-10-CM | POA: Diagnosis not present

## 2020-11-09 ENCOUNTER — Ambulatory Visit: Payer: Medicare HMO | Admitting: Nurse Practitioner

## 2020-11-11 ENCOUNTER — Encounter: Payer: Self-pay | Admitting: Family

## 2020-11-11 ENCOUNTER — Ambulatory Visit (INDEPENDENT_AMBULATORY_CARE_PROVIDER_SITE_OTHER): Payer: Medicare HMO | Admitting: Family

## 2020-11-11 ENCOUNTER — Other Ambulatory Visit: Payer: Self-pay

## 2020-11-11 VITALS — BP 139/94 | HR 83 | Temp 97.9°F | Ht 60.0 in

## 2020-11-11 DIAGNOSIS — G114 Hereditary spastic paraplegia: Secondary | ICD-10-CM

## 2020-11-11 DIAGNOSIS — R609 Edema, unspecified: Secondary | ICD-10-CM

## 2020-11-11 DIAGNOSIS — M25512 Pain in left shoulder: Secondary | ICD-10-CM | POA: Diagnosis not present

## 2020-11-11 MED ORDER — DICLOFENAC SODIUM 75 MG PO TBEC: 75.0000 mg | DELAYED_RELEASE_TABLET | Freq: Two times a day (BID) | ORAL | 0 refills | Status: DC

## 2020-11-11 MED ORDER — PREDNISONE 10 MG (21) PO TBPK: ORAL_TABLET | ORAL | 0 refills | Status: DC

## 2020-11-11 NOTE — Patient Instructions (Signed)
Shoulder Pain Many things can cause shoulder pain, including:  An injury to the shoulder.  Overuse of the shoulder.  Arthritis. The source of the pain can be:  Inflammation.  An injury to the shoulder joint.  An injury to a tendon, ligament, or bone. Follow these instructions at home: Pay attention to changes in your symptoms. Let your health care provider know about them. Follow these instructions to relieve your pain. If you have a sling:  Wear the sling as told by your health care provider. Remove it only as told by your health care provider.  Loosen the sling if your fingers tingle, become numb, or turn cold and blue.  Keep the sling clean.  If the sling is not waterproof: ? Do not let it get wet. Remove it to shower or bathe.  Move your arm as little as possible, but keep your hand moving to prevent swelling. Managing pain, stiffness, and swelling  If directed, put ice on the painful area: ? Put ice in a plastic bag. ? Place a towel between your skin and the bag. ? Leave the ice on for 20 minutes, 2-3 times per day. Stop applying ice if it does not help with the pain.  Squeeze a soft ball or a foam pad as much as possible. This helps to keep the shoulder from swelling. It also helps to strengthen the arm.   General instructions  Take over-the-counter and prescription medicines only as told by your health care provider.  Keep all follow-up visits as told by your health care provider. This is important. Contact a health care provider if:  Your pain gets worse.  Your pain is not relieved with medicines.  New pain develops in your arm, hand, or fingers. Get help right away if:  Your arm, hand, or fingers: ? Tingle. ? Become numb. ? Become swollen. ? Become painful. ? Turn white or blue. Summary  Shoulder pain can be caused by an injury, overuse, or arthritis.  Pay attention to changes in your symptoms. Let your health care provider know about  them.  This condition may be treated with a sling, ice, and pain medicines.  Contact your health care provider if the pain gets worse or new pain develops. Get help right away if your arm, hand, or fingers tingle or become numb, swollen, or painful.  Keep all follow-up visits as told by your health care provider. This is important. This information is not intended to replace advice given to you by your health care provider. Make sure you discuss any questions you have with your health care provider. Document Revised: 02/12/2018 Document Reviewed: 02/12/2018 Elsevier Patient Education  2021 [employer]  

## 2020-11-11 NOTE — Progress Notes (Signed)
Subjective:    Patient ID: Tiffany Stewart, female    DOB: 1971-06-28, 50 y.o.   MRN: [id number]  Chief Complaint  Patient presents with   Shoulder Pain    Left shoulder pain x 1 mth    Edema    Both legs and pain in knees     Shoulder Pain  The pain is present in the left shoulder. This is a new problem. The current episode started more than 1 month ago. There has been no history of extremity trauma. The problem occurs constantly. The quality of the pain is described as aching. The pain is at a severity of 10/10. The pain is moderate. Associated symptoms include a limited range of motion and stiffness. She has tried movement, NSAIDS and rest for the symptoms. The treatment provided mild relief.  Edema PT complaining of bilateral swelling in her ankles. 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     Review of Systems  Musculoskeletal: Positive for stiffness.  All other systems reviewed and are negative.      Objective:   Physical Exam Vitals reviewed.  Constitutional:      General: She is not in acute distress.    Appearance: She is well-developed.  HENT:     Head: Normocephalic and atraumatic.  Eyes:     Pupils: Pupils are equal, round, and reactive to light.  Neck:     Thyroid: No thyromegaly.  Cardiovascular:     Rate and Rhythm: Normal rate and regular rhythm.     Heart sounds: Normal heart sounds. No murmur heard.   Pulmonary:     Effort: Pulmonary effort is normal. No respiratory distress.     Breath sounds: Normal breath sounds. No wheezing.  Abdominal:     General: Bowel sounds are normal. There is no distension.     Palpations: Abdomen is soft.     Tenderness: There is no abdominal tenderness.  Musculoskeletal:        General: Tenderness (pain in left shoulder with abduction) present.     Cervical back: Normal range of motion and neck supple.     Right lower leg: Edema (trace) present.     Left lower leg: Edema (trace) present.      Comments: Generalized weakness in bilateral extremities   Skin:    General: Skin is warm and dry.  Neurological:     Mental Status: She is alert and oriented to person, place, and time.     Cranial Nerves: No cranial nerve deficit.     Deep Tendon Reflexes: Reflexes are normal and symmetric.  Psychiatric:        Behavior: Behavior normal.        Thought Content: Thought content normal.        Judgment: Judgment normal.       BP (!) 139/94   Pulse 83   Temp 97.9 F (36.6 C) (Temporal)   Ht 5' (1.524 m)   BMI 23.03 kg/m      Assessment & Plan:  ERENDIDA WRENN comes in today with chief complaint of Shoulder Pain (Left shoulder pain x 1 mth ) and Edema (Both legs and pain in knees )   Diagnosis and orders addressed:  1. HSP (hereditary spastic paraplegia) (HCC) - Compression stockings  2. Acute pain of left shoulder Rest No other NSAID's while taking diclofenac  - predniSONE (STERAPRED UNI-PAK 21 TAB) 10 MG (21) TBPK tablet; Use as directed  Dispense: 21 tablet; Refill:  0 - diclofenac (VOLTAREN) 75 MG EC tablet; Take 1 tablet (75 mg total) by mouth 2 (two) times daily.  Dispense: 30 tablet; Refill: 0  3. Peripheral edema Wear compression  - Compression stockings  Follow up plan: Keep follow up  Evelina Dun, FNP

## 2020-11-12 ENCOUNTER — Telehealth: Payer: Self-pay

## 2020-11-12 NOTE — Telephone Encounter (Signed)
  Prescription Request  11/12/2020  What is the name of the medication or equipment? Baclofen 10 mg  Have you contacted your pharmacy to request a refill? (if applicable) NO  Which pharmacy would you like this sent to? [facility]    Patient notified that their request is being sent to the clinical staff for review and that they should receive a response within 2 business days.

## 2020-11-12 NOTE — Telephone Encounter (Signed)
Please advise.  Patient was seen by [facility] yesterday for shoulder pain, prescribed Voltaren and Prednisone.

## 2020-11-12 NOTE — Telephone Encounter (Signed)
Pt needs rx for Compression stockings fax to [facility]

## 2020-11-15 ENCOUNTER — Other Ambulatory Visit: Payer: Self-pay | Admitting: Nurse Practitioner

## 2020-11-15 DIAGNOSIS — R609 Edema, unspecified: Secondary | ICD-10-CM

## 2020-11-15 NOTE — Telephone Encounter (Signed)
She has had baclfen overdose and rally should not be taking

## 2020-11-15 NOTE — Telephone Encounter (Signed)
Ok to order compression stockings

## 2020-11-15 NOTE — Telephone Encounter (Signed)
Attempted to contact patient - NA °

## 2020-11-15 NOTE — Progress Notes (Signed)
compression

## 2020-11-15 NOTE — Telephone Encounter (Signed)
Compression stockings faxed to [facility]  Attempted to contact patient- NA

## 2020-11-30 ENCOUNTER — Other Ambulatory Visit: Payer: Self-pay | Admitting: Nurse Practitioner

## 2020-11-30 NOTE — Telephone Encounter (Signed)
Last office visit 11/11/20 Last refill 08/03/20, #60, 3 refills

## 2020-12-13 ENCOUNTER — Telehealth: Payer: Self-pay

## 2020-12-13 DIAGNOSIS — M25512 Pain in left shoulder: Secondary | ICD-10-CM

## 2020-12-13 NOTE — Telephone Encounter (Signed)
  Prescription Request  12/13/2020  What is the name of the medication or equipment? diclogenace  Have you contacted your pharmacy to request a refill? (if applicable) ;  Which pharmacy would you like this sent to? [facility]   Patient notified that their request is being sent to the clinical staff for review and that they should receive a response within 2 business days.

## 2020-12-13 NOTE — Telephone Encounter (Signed)
LMOVM to clarify that you are requesting refill on Diclofenac/Voltaren

## 2020-12-21 MED ORDER — DICLOFENAC SODIUM 75 MG PO TBEC: 75.0000 mg | DELAYED_RELEASE_TABLET | Freq: Two times a day (BID) | ORAL | 0 refills | Status: DC

## 2020-12-21 NOTE — Telephone Encounter (Signed)
Diclofenac sent to pharmacy

## 2020-12-23 ENCOUNTER — Other Ambulatory Visit: Payer: Self-pay | Admitting: Nurse Practitioner

## 2020-12-24 ENCOUNTER — Other Ambulatory Visit: Payer: Self-pay | Admitting: Nurse Practitioner

## 2020-12-29 ENCOUNTER — Other Ambulatory Visit: Payer: Self-pay | Admitting: Nurse Practitioner

## 2021-01-17 ENCOUNTER — Other Ambulatory Visit: Payer: Self-pay | Admitting: Nurse Practitioner

## 2021-01-17 DIAGNOSIS — M25512 Pain in left shoulder: Secondary | ICD-10-CM

## 2021-02-01 ENCOUNTER — Ambulatory Visit: Payer: Self-pay | Admitting: Nurse Practitioner

## 2021-02-01 NOTE — Progress Notes (Deleted)
      Objective:   Physical Exam        Assessment & Plan:

## 2021-02-02 ENCOUNTER — Encounter: Payer: Self-pay | Admitting: Nurse Practitioner

## 2021-02-12 ENCOUNTER — Other Ambulatory Visit: Payer: Self-pay | Admitting: Nurse Practitioner

## 2021-02-12 DIAGNOSIS — M25512 Pain in left shoulder: Secondary | ICD-10-CM

## 2021-02-28 ENCOUNTER — Other Ambulatory Visit: Payer: Self-pay | Admitting: Nurse Practitioner

## 2021-02-28 DIAGNOSIS — F411 Generalized anxiety disorder: Secondary | ICD-10-CM

## 2021-03-03 ENCOUNTER — Ambulatory Visit (INDEPENDENT_AMBULATORY_CARE_PROVIDER_SITE_OTHER): Payer: Medicare HMO | Admitting: Nurse Practitioner

## 2021-03-03 ENCOUNTER — Encounter: Payer: Self-pay | Admitting: Nurse Practitioner

## 2021-03-03 ENCOUNTER — Other Ambulatory Visit: Payer: Self-pay

## 2021-03-03 VITALS — BP 153/104 | HR 78 | Temp 98.0°F | Resp 20

## 2021-03-03 DIAGNOSIS — K2100 Gastro-esophageal reflux disease with esophagitis, without bleeding: Secondary | ICD-10-CM | POA: Diagnosis not present

## 2021-03-03 DIAGNOSIS — G43101 Migraine with aura, not intractable, with status migrainosus: Secondary | ICD-10-CM

## 2021-03-03 DIAGNOSIS — G114 Hereditary spastic paraplegia: Secondary | ICD-10-CM | POA: Diagnosis not present

## 2021-03-03 DIAGNOSIS — F411 Generalized anxiety disorder: Secondary | ICD-10-CM | POA: Diagnosis not present

## 2021-03-03 DIAGNOSIS — Z136 Encounter for screening for cardiovascular disorders: Secondary | ICD-10-CM | POA: Diagnosis not present

## 2021-03-03 MED ORDER — TOPIRAMATE 50 MG PO TABS: ORAL_TABLET | ORAL | 1 refills | Status: DC

## 2021-03-03 MED ORDER — OMEPRAZOLE 20 MG PO CPDR: 20.0000 mg | DELAYED_RELEASE_CAPSULE | Freq: Every day | ORAL | 3 refills | Status: DC

## 2021-03-03 MED ORDER — BACLOFEN 10 MG PO TABS: 10.0000 mg | ORAL_TABLET | Freq: Two times a day (BID) | ORAL | 2 refills | Status: DC

## 2021-03-03 MED ORDER — ESCITALOPRAM OXALATE 10 MG PO TABS: 10.0000 mg | ORAL_TABLET | Freq: Every day | ORAL | 1 refills | Status: DC

## 2021-03-03 MED ORDER — ALPRAZOLAM 0.5 MG PO TABS: 0.5000 mg | ORAL_TABLET | Freq: Two times a day (BID) | ORAL | 5 refills | Status: DC | PRN

## 2021-03-03 MED ORDER — PANTOPRAZOLE SODIUM 40 MG PO TBEC: 40.0000 mg | DELAYED_RELEASE_TABLET | Freq: Two times a day (BID) | ORAL | 1 refills | Status: DC

## 2021-03-03 MED ORDER — DICLOFENAC SODIUM 75 MG PO TBEC: 75.0000 mg | DELAYED_RELEASE_TABLET | Freq: Two times a day (BID) | ORAL | 2 refills | Status: DC

## 2021-03-03 NOTE — Progress Notes (Signed)
Subjective:    Patient ID: Tiffany Stewart, female    DOB: 1970-08-27, 50 y.o.   MRN: [id number]   Chief Complaint: Medical Management of Chronic Issues    HPI:  1. Anxiety state GAD 7 : Generalized Anxiety Score 03/03/2021 08/03/2020 04/09/2020 09/26/2019  Nervous, Anxious, on Edge 0 0 0 1  Control/stop worrying 0 0 0 2  Worry too much - different things 0 0 0 2  Trouble relaxing 0 0 0 1  Restless 0 0 0 0  Easily annoyed or irritable 0 0 0 1  Afraid - awful might happen 0 0 0 0  Total GAD 7 Score 0 0 0 7  Anxiety Difficulty Not difficult at all Not difficult at all Not difficult at all Somewhat difficult  Takes medication as prescribed. States she does not have any concerns or complaints regarding this today.    2. Gastroesophageal reflux disease with esophagitis without hemorrhage Takes medication as prescribed. States she takes two protonix a day and has noticed an increase in HB sx lately. States she did better with omeprazole and would like to switch back.    3. Migraine with aura and with status migrainosus, not intractable Denies complaints, has not had many episodes recently. States migraines ~2 migraines a month.   4. HSP She is having worsening muscle spasms. We allowed her to have her baclofen back even though there is a history of her taking to many.   Outpatient Encounter Medications as of 03/03/2021  Medication Sig   acetaminophen (TYLENOL) 500 MG tablet Take 1,500 mg by mouth 2 (two) times daily as needed for headache.    ALPRAZolam (XANAX) 0.5 MG tablet Take 1 tablet (0.5 mg total) by mouth 2 (two) times daily as needed. for anxiety   Aspirin-Acetaminophen-Caffeine (GOODY HEADACHE PO) Take 1 Package by mouth daily as needed.   baclofen (LIORESAL) 10 MG tablet TAKE (1) TABLET TWICE DAILY.   diclofenac (VOLTAREN) 75 MG EC tablet Take 1 tablet (75 mg total) by mouth 2 (two) times daily. (NEEDS TO BE SEEN BEFORE NEXT REFILL)   diclofenac Sodium (VOLTAREN) 1 % GEL Apply  4 g topically 4 (four) times daily. (Patient taking differently: Apply 4 g topically 4 (four) times daily as needed (muscle pain).)   [DISCONTINUED] escitalopram (LEXAPRO) 10 MG tablet Take 1 tablet (10 mg total) by mouth daily.   [DISCONTINUED] pantoprazole (PROTONIX) 40 MG tablet Take 1 tablet (40 mg total) by mouth 2 (two) times daily before a meal.   [DISCONTINUED] predniSONE (STERAPRED UNI-PAK 21 TAB) 10 MG (21) TBPK tablet Use as directed   [DISCONTINUED] topiramate (TOPAMAX) 50 MG tablet TAKE (1) TABLET TWICE DAILY. (Patient taking differently: Take 50 mg by mouth 2 (two) times daily.)   escitalopram (LEXAPRO) 10 MG tablet Take 1 tablet (10 mg total) by mouth daily.   pantoprazole (PROTONIX) 40 MG tablet Take 1 tablet (40 mg total) by mouth 2 (two) times daily before a meal.   topiramate (TOPAMAX) 50 MG tablet TAKE (1) TABLET TWICE DAILY.   No facility-administered encounter medications on file as of 03/03/2021.    Past Surgical History:  Procedure Laterality Date   CESAREAN SECTION     CHOLECYSTECTOMY     ENDOMETRIAL ABLATION  2000   GASTRORRHAPHY  03/17/2018   Procedure: GASTRORRHAPHY;  Surgeon: Aviva Signs, MD;  Location: [facility];  Service: General;;   HERNIA REPAIR     LAPAROTOMY N/A 03/17/2018   Procedure: EXPLORATORY LAPAROTOMY;  Surgeon: Aviva Signs,  MD;  Location: [facility];  Service: General;  Laterality: N/A;    Family History  Problem Relation Age of Onset   Diabetes Father    Cancer Father        lung cancer   Heart disease Father    Heart disease Maternal Grandmother    Stroke Maternal Grandfather    Diabetes Paternal Grandmother    Colon cancer Neg Hx     New complaints: States chronic leg cramping, has been taking baclofen and diclofenac with relief but has run out. Pt has PT at home but states PT has stopped coming out "suddenly" after multiple cancellations by patient.   Social history: Lives at home with daughter. Watches TV but does not get out as much as  she used to due to being wheelchair bound.   Controlled substance contract: signed today 03/03/2021  Review of Systems  Constitutional: Negative.   HENT: Negative.    Eyes: Negative.   Respiratory: Negative.    Cardiovascular: Negative.   Gastrointestinal: Negative.   Endocrine: Negative.   Genitourinary: Negative.   Musculoskeletal:  Positive for arthralgias, gait problem, joint swelling and myalgias. Negative for neck pain and neck stiffness.  Skin: Negative.   Allergic/Immunologic: Negative.   Neurological:  Positive for weakness.  Hematological: Negative.   Psychiatric/Behavioral: Negative.    All other systems reviewed and are negative.     Objective:   Physical Exam Vitals and nursing note reviewed.  Constitutional:      Appearance: Normal appearance.  HENT:     Head: Normocephalic and atraumatic.     Right Ear: Tympanic membrane normal.     Left Ear: Tympanic membrane normal.     Nose: Nose normal.     Mouth/Throat:     Mouth: Mucous membranes are moist.     Pharynx: Oropharynx is clear.  Eyes:     Pupils: Pupils are equal, round, and reactive to light.  Cardiovascular:     Rate and Rhythm: Normal rate and regular rhythm.     Pulses: Normal pulses.     Heart sounds: Normal heart sounds.  Pulmonary:     Effort: Pulmonary effort is normal.     Breath sounds: Examination of the right-upper field reveals rhonchi. Examination of the right-lower field reveals rhonchi. Rhonchi present.     Comments: Smoking ~5 ciggs a week Abdominal:     General: Abdomen is flat.     Palpations: Abdomen is soft.  Musculoskeletal:        General: No signs of injury.     Cervical back: Normal range of motion.     Right ankle: Deformity present. No tenderness. Normal pulse.     Left ankle: Deformity present. No tenderness. Normal pulse.  Skin:    General: Skin is warm and dry.     Capillary Refill: Capillary refill takes less than 2 seconds.  Neurological:     General: No focal  deficit present.     Mental Status: She is alert and oriented to person, place, and time. Mental status is at baseline.  Psychiatric:        Mood and Affect: Mood normal.        Behavior: Behavior normal.        Thought Content: Thought content normal.        Judgment: Judgment normal.   BP (!) 153/104   Pulse 78   Temp 98 F (36.7 C) (Temporal)   Resp 20      Assessment &  Plan:  Tiffany Stewart comes in today with chief complaint of Medical Management of Chronic Issues   Diagnosis and orders addressed:  1. Anxiety state Stress management - escitalopram (LEXAPRO) 10 MG tablet; Take 1 tablet (10 mg total) by mouth daily.  Dispense: 90 tablet; Refill: 1 - ALPRAZolam (XANAX) 0.5 MG tablet; Take 1 tablet (0.5 mg total) by mouth 2 (two) times daily as needed. for anxiety  Dispense: 60 tablet; Refill: 5  2. Gastroesophageal reflux disease with esophagitis without hemorrhage Avoid spicy foods Do not eat 2 hours prior to bedtime - omeprazole (PRILOSEC) 20 MG capsule; Take 1 capsule (20 mg total) by mouth daily.  Dispense: 30 capsule; Refill: 3  3. Migraine with aura and with status migrainosus, not intractable Avoid caffeine - topiramate (TOPAMAX) 50 MG tablet; TAKE (1) TABLET TWICE DAILY.  Dispense: 180 tablet; Refill: 1  4. HSP (hereditary spastic paraplegia) (HCC) Needs braces for her legs but she keeps being told was not approved to get- patient will find out what is going on and exactly what do they need. - diclofenac (VOLTAREN) 75 MG EC tablet; Take 1 tablet (75 mg total) by mouth 2 (two) times daily. (NEEDS TO BE SEEN BEFORE NEXT REFILL)  Dispense: 60 tablet; Refill: 2 - CBC with Differential/Platelet - CMP14+EGFR - Lipid panel   Labs pending Health Maintenance reviewed Diet and exercise encouraged  Follow up plan: 3 months   [place], FNP

## 2021-03-03 NOTE — Addendum Note (Signed)
Addended by: Chevis Pretty on: 03/03/2021 10:51 AM   Modules accepted: Orders

## 2021-03-03 NOTE — Patient Instructions (Addendum)

## 2021-03-04 LAB — CBC WITH DIFFERENTIAL/PLATELET
Basophils Absolute: 0 10*3/uL (ref 0.0–0.2)
Basos: 1 %
EOS (ABSOLUTE): 0.1 10*3/uL (ref 0.0–0.4)
Eos: 1 %
Hematocrit: 40.4 % (ref 34.0–46.6)
Hemoglobin: 14 g/dL (ref 11.1–15.9)
Immature Grans (Abs): 0 10*3/uL (ref 0.0–0.1)
Immature Granulocytes: 0 %
Lymphocytes Absolute: 1.7 10*3/uL (ref 0.7–3.1)
Lymphs: 33 %
MCH: 32.2 pg (ref 26.6–33.0)
MCHC: 34.7 g/dL (ref 31.5–35.7)
MCV: 93 fL (ref 79–97)
Monocytes Absolute: 0.3 10*3/uL (ref 0.1–0.9)
Monocytes: 5 %
Neutrophils Absolute: 3.2 10*3/uL (ref 1.4–7.0)
Neutrophils: 60 %
Platelets: 320 10*3/uL (ref 150–450)
RBC: 4.35 x10E6/uL (ref 3.77–5.28)
RDW: 11.3 % — ABNORMAL LOW (ref 11.7–15.4)
WBC: 5.3 10*3/uL (ref 3.4–10.8)

## 2021-03-04 LAB — CMP14+EGFR
ALT: 86 IU/L — ABNORMAL HIGH (ref 0–32)
AST: 56 IU/L — ABNORMAL HIGH (ref 0–40)
Albumin/Globulin Ratio: 1.8 (ref 1.2–2.2)
Albumin: 4.7 g/dL (ref 3.8–4.8)
Alkaline Phosphatase: 81 IU/L (ref 44–121)
BUN/Creatinine Ratio: 20 (ref 9–23)
BUN: 12 mg/dL (ref 6–24)
Bilirubin Total: 0.3 mg/dL (ref 0.0–1.2)
CO2: 19 mmol/L — ABNORMAL LOW (ref 20–29)
Calcium: 9.1 mg/dL (ref 8.7–10.2)
Chloride: 103 mmol/L (ref 96–106)
Creatinine, Ser: 0.6 mg/dL (ref 0.57–1.00)
Globulin, Total: 2.6 g/dL (ref 1.5–4.5)
Glucose: 87 mg/dL (ref 65–99)
Potassium: 4.1 mmol/L (ref 3.5–5.2)
Sodium: 139 mmol/L (ref 134–144)
Total Protein: 7.3 g/dL (ref 6.0–8.5)
eGFR: 111 mL/min/{1.73_m2} (ref >59)

## 2021-03-04 LAB — LIPID PANEL
Chol/HDL Ratio: 2.9 ratio (ref 0.0–4.4)
Cholesterol, Total: 158 mg/dL (ref 100–199)
HDL: 55 mg/dL (ref >39)
LDL Chol Calc (NIH): 86 mg/dL (ref 0–99)
Triglycerides: 92 mg/dL (ref 0–149)
VLDL Cholesterol Cal: 17 mg/dL (ref 5–40)

## 2021-03-08 ENCOUNTER — Telehealth: Payer: Self-pay | Admitting: Nurse Practitioner

## 2021-03-09 NOTE — Telephone Encounter (Signed)
After call with pharmacy, lmovm to pt pharmacy did receive refills on 03/03/21 & it was too soon to fill, they will fill & deliver on 03/10/21

## 2021-03-10 ENCOUNTER — Telehealth: Payer: Self-pay

## 2021-03-10 NOTE — Telephone Encounter (Signed)
Received refill request from pts [place] today. Requesting Pantoprazole and DM supplies. Pt is taking Omeprazole and does not have a DM diagnosis.  Request denied and faxed back to [facility].

## 2021-03-15 ENCOUNTER — Other Ambulatory Visit: Payer: Self-pay | Admitting: *Deleted

## 2021-03-15 DIAGNOSIS — F411 Generalized anxiety disorder: Secondary | ICD-10-CM

## 2021-03-15 MED ORDER — BACLOFEN 10 MG PO TABS: 10.0000 mg | ORAL_TABLET | Freq: Two times a day (BID) | ORAL | 2 refills | Status: DC

## 2021-03-15 MED ORDER — DICLOFENAC SODIUM 1 % EX GEL: 4.0000 g | Freq: Four times a day (QID) | CUTANEOUS | 1 refills | Status: DC

## 2021-03-15 MED ORDER — ESCITALOPRAM OXALATE 10 MG PO TABS: 10.0000 mg | ORAL_TABLET | Freq: Every day | ORAL | 1 refills | Status: DC

## 2021-04-01 ENCOUNTER — Other Ambulatory Visit: Payer: Self-pay | Admitting: *Deleted

## 2021-04-01 DIAGNOSIS — G114 Hereditary spastic paraplegia: Secondary | ICD-10-CM

## 2021-04-01 DIAGNOSIS — F411 Generalized anxiety disorder: Secondary | ICD-10-CM

## 2021-04-01 MED ORDER — DICLOFENAC SODIUM 75 MG PO TBEC: 75.0000 mg | DELAYED_RELEASE_TABLET | Freq: Two times a day (BID) | ORAL | 0 refills | Status: DC

## 2021-05-10 ENCOUNTER — Ambulatory Visit (INDEPENDENT_AMBULATORY_CARE_PROVIDER_SITE_OTHER): Payer: Medicare HMO | Admitting: Nurse Practitioner

## 2021-05-10 ENCOUNTER — Encounter: Payer: Self-pay | Admitting: Nurse Practitioner

## 2021-05-10 DIAGNOSIS — K219 Gastro-esophageal reflux disease without esophagitis: Secondary | ICD-10-CM | POA: Diagnosis not present

## 2021-05-10 MED ORDER — OMEPRAZOLE 40 MG PO CPDR: 40.0000 mg | DELAYED_RELEASE_CAPSULE | Freq: Every day | ORAL | 3 refills | Status: DC

## 2021-05-10 NOTE — Progress Notes (Signed)
   Virtual Visit  Note Due to COVID-19 pandemic this visit was conducted virtually. This visit type was conducted due to national recommendations for restrictions regarding the COVID-19 Pandemic (e.g. social distancing, sheltering in place) in an effort to limit this patient's exposure and mitigate transmission in our community. All issues noted in this document were discussed and addressed.  A physical exam was not performed with this format.  I connected with Tiffany Stewart on 05/10/21 at 12:32 by telephone and verified that I am speaking with the correct person using two identifiers. Tiffany Stewart is currently located at home and no one is currently with her during visit. The provider, Mary-Margaret Hassell Done, FNP is located in their office at time of visit.  I discussed the limitations, risks, security and privacy concerns of performing an evaluation and management service by telephone and the availability of in person appointments. I also discussed with the patient that there may be a patient responsible charge related to this service. The patient expressed understanding and agreed to proceed.   History and Present Illness:   Patient states that she is having trouble with her acid reflux. Patient is current;y on omeprazole 20mg  daily and she is having reflux every day. She says she avoids spicy foods. She said no matter what she etas , she can feel it coming back up in her throat.   Review of Systems  Constitutional:  Negative for diaphoresis and weight loss.  Eyes:  Negative for blurred vision, double vision and pain.  Respiratory:  Negative for shortness of breath.   Cardiovascular:  Negative for chest pain, palpitations, orthopnea and leg swelling.  Gastrointestinal:  Positive for heartburn. Negative for abdominal pain, nausea and vomiting.  Skin:  Negative for rash.  Neurological:  Negative for dizziness, sensory change, loss of consciousness, weakness and headaches.  Endo/Heme/Allergies:   Negative for polydipsia. Does not bruise/bleed easily.  Psychiatric/Behavioral:  Negative for memory loss. The patient does not have insomnia.   All other systems reviewed and are negative.   Observations/Objective: Alert and oriented- answers all questions appropriately No distress   Assessment and Plan: Tiffany Stewart in today with chief complaint of Gastroesophageal Reflux   1. Gastroesophageal reflux disease without esophagitis Avoid spicy foods Do not eat 2 hours prior to bedtime  - omeprazole (PRILOSEC) 40 MG capsule; Take 1 capsule (40 mg total) by mouth daily.  Dispense: 30 capsule; Refill: 3     Follow Up Instructions: prn    I discussed the assessment and treatment plan with the patient. The patient was provided an opportunity to ask questions and all were answered. The patient agreed with the plan and demonstrated an understanding of the instructions.   The patient was advised to call back or seek an in-person evaluation if the symptoms worsen or if the condition fails to improve as anticipated.  The above assessment and management plan was discussed with the patient. The patient verbalized understanding of and has agreed to the management plan. Patient is aware to call the clinic if symptoms persist or worsen. Patient is aware when to return to the clinic for a follow-up visit. Patient educated on when it is appropriate to go to the emergency department.   Time call ended:  12:43  I provided 11 minutes of  non face-to-face time during this encounter.    Mary-Margaret Hassell Done, FNP

## 2021-05-28 ENCOUNTER — Other Ambulatory Visit: Payer: Self-pay | Admitting: Nurse Practitioner

## 2021-06-07 ENCOUNTER — Other Ambulatory Visit: Payer: Self-pay | Admitting: *Deleted

## 2021-06-07 DIAGNOSIS — G114 Hereditary spastic paraplegia: Secondary | ICD-10-CM

## 2021-06-07 MED ORDER — DICLOFENAC SODIUM 75 MG PO TBEC: 75.0000 mg | DELAYED_RELEASE_TABLET | Freq: Two times a day (BID) | ORAL | 0 refills | Status: DC

## 2021-06-17 ENCOUNTER — Other Ambulatory Visit: Payer: Self-pay | Admitting: Nurse Practitioner

## 2021-06-17 DIAGNOSIS — G114 Hereditary spastic paraplegia: Secondary | ICD-10-CM

## 2021-08-04 ENCOUNTER — Encounter: Payer: Self-pay | Admitting: Nurse Practitioner

## 2021-08-04 ENCOUNTER — Ambulatory Visit: Payer: Medicare HMO | Admitting: Nurse Practitioner

## 2021-08-04 ENCOUNTER — Ambulatory Visit: Payer: Self-pay | Admitting: Nurse Practitioner

## 2021-08-04 ENCOUNTER — Ambulatory Visit (INDEPENDENT_AMBULATORY_CARE_PROVIDER_SITE_OTHER): Payer: Medicare HMO | Admitting: Nurse Practitioner

## 2021-08-04 VITALS — BP 134/78 | HR 100 | Temp 97.5°F | Resp 20

## 2021-08-04 DIAGNOSIS — D509 Iron deficiency anemia, unspecified: Secondary | ICD-10-CM | POA: Diagnosis not present

## 2021-08-04 DIAGNOSIS — G43101 Migraine with aura, not intractable, with status migrainosus: Secondary | ICD-10-CM | POA: Diagnosis not present

## 2021-08-04 DIAGNOSIS — G114 Hereditary spastic paraplegia: Secondary | ICD-10-CM | POA: Diagnosis not present

## 2021-08-04 DIAGNOSIS — F17200 Nicotine dependence, unspecified, uncomplicated: Secondary | ICD-10-CM

## 2021-08-04 DIAGNOSIS — F411 Generalized anxiety disorder: Secondary | ICD-10-CM

## 2021-08-04 DIAGNOSIS — K219 Gastro-esophageal reflux disease without esophagitis: Secondary | ICD-10-CM

## 2021-08-04 DIAGNOSIS — K581 Irritable bowel syndrome with constipation: Secondary | ICD-10-CM | POA: Diagnosis not present

## 2021-08-04 DIAGNOSIS — Z136 Encounter for screening for cardiovascular disorders: Secondary | ICD-10-CM | POA: Diagnosis not present

## 2021-08-04 MED ORDER — TOPIRAMATE 50 MG PO TABS: ORAL_TABLET | ORAL | 1 refills | Status: DC

## 2021-08-04 MED ORDER — ALPRAZOLAM 0.5 MG PO TABS: 0.5000 mg | ORAL_TABLET | Freq: Two times a day (BID) | ORAL | 5 refills | Status: DC | PRN

## 2021-08-04 MED ORDER — DICLOFENAC SODIUM 75 MG PO TBEC: 75.0000 mg | DELAYED_RELEASE_TABLET | Freq: Two times a day (BID) | ORAL | 0 refills | Status: DC

## 2021-08-04 MED ORDER — ESCITALOPRAM OXALATE 10 MG PO TABS: 10.0000 mg | ORAL_TABLET | Freq: Every day | ORAL | 1 refills | Status: DC

## 2021-08-04 MED ORDER — BACLOFEN 10 MG PO TABS: 10.0000 mg | ORAL_TABLET | Freq: Two times a day (BID) | ORAL | 1 refills | Status: DC

## 2021-08-04 MED ORDER — PANTOPRAZOLE SODIUM 40 MG PO TBEC: 40.0000 mg | DELAYED_RELEASE_TABLET | Freq: Every day | ORAL | 3 refills | Status: DC

## 2021-08-04 NOTE — Patient Instructions (Signed)

## 2021-08-04 NOTE — Progress Notes (Signed)
Subjective:    Patient ID: Tiffany Stewart, female    DOB: [birth year], 50 y.o.   MRN: [id number]   Chief Complaint: No chief complaint on file.    HPI:  Tiffany Stewart is a 50 y.o. who identifies as a female who was assigned female at birth.   Social history: Lives with: her daughter  Work history: on disability   Comes in today for follow up of the following chronic medical issues:  1. Migraine with aura and with status migrainosus, not intractable The topmax is working well. Tylenol helps relieve them. Has a light headache daily.  2. Gastroesophageal reflux disease without esophagitis Is on omperpazle and that works well most days . Has occasional breakthrough mainly in the evening.  3. Irritable bowel syndrome with constipation Is doing well right now. Has occasional constipation as well as occasional diarrhea.  4. HSP (hereditary spastic paraplegia) ([place]) Is confined to wheel chair most of the time. She is able to stand long enough to tranfer herself.  5. Anxiety state Is on xanax BID. Stays very anxious. GAD 7 : Generalized Anxiety Score 08/04/2021 03/03/2021 08/03/2020 04/09/2020  Nervous, Anxious, on Edge 0 0 0 0  Control/stop worrying 0 0 0 0  Worry too much - different things 0 0 0 0  Trouble relaxing 0 0 0 0  Restless 0 0 0 0  Easily annoyed or irritable 0 0 0 0  Afraid - awful might happen 0 0 0 0  Total GAD 7 Score 0 0 0 0  Anxiety Difficulty Not difficult at all Not difficult at all Not difficult at all Not difficult at all       6. Iron deficiency anemia, unspecified iron deficiency anemia type Lab Results  Component Value Date   HGB 14.0 03/03/2021     7. TOBACCO ABUSE Smokes only 1/2 pack every 2 days   New complaints: None today  Allergies  Allergen Reactions   Hctz [Hydrochlorothiazide] Diarrhea   Morphine Other (See Comments)    Flushing, turns skin red    Zoloft [Sertraline Hcl] Other (See Comments)    Spaced out   Outpatient  Encounter Medications as of 08/04/2021  Medication Sig   acetaminophen (TYLENOL) 500 MG tablet Take 1,500 mg by mouth 2 (two) times daily as needed for headache.    ALPRAZolam (XANAX) 0.5 MG tablet Take 1 tablet (0.5 mg total) by mouth 2 (two) times daily as needed. for anxiety   Aspirin-Acetaminophen-Caffeine (GOODY HEADACHE PO) Take 1 Package by mouth daily as needed.   baclofen (LIORESAL) 10 MG tablet TAKE 1 TABLET TWICE DAILY   diclofenac (VOLTAREN) 75 MG EC tablet Take 1 tablet (75 mg total) by mouth 2 (two) times daily.   diclofenac Sodium (VOLTAREN) 1 % GEL Apply 4 g topically 4 (four) times daily.   escitalopram (LEXAPRO) 10 MG tablet Take 1 tablet (10 mg total) by mouth daily.   omeprazole (PRILOSEC) 40 MG capsule Take 1 capsule (40 mg total) by mouth daily.   topiramate (TOPAMAX) 50 MG tablet TAKE (1) TABLET TWICE DAILY.   No facility-administered encounter medications on file as of 08/04/2021.    Past Surgical History:  Procedure Laterality Date   CESAREAN SECTION     CHOLECYSTECTOMY     ENDOMETRIAL ABLATION  2000   GASTRORRHAPHY  03/17/2018   Procedure: GASTRORRHAPHY;  Surgeon: Aviva Signs, MD;  Location: [facility];  Service: General;;   HERNIA REPAIR     LAPAROTOMY N/A 03/17/2018  Procedure: EXPLORATORY LAPAROTOMY;  Surgeon: Aviva Signs, MD;  Location: [facility];  Service: General;  Laterality: N/A;    Family History  Problem Relation Age of Onset   Diabetes Father    Cancer Father        lung cancer   Heart disease Father    Heart disease Maternal Grandmother    Stroke Maternal Grandfather    Diabetes Paternal Grandmother    Colon cancer Neg Hx       Controlled substance contract: 08/04/21     Review of Systems  Constitutional:  Negative for diaphoresis.  Eyes:  Negative for pain.  Respiratory:  Negative for shortness of breath.   Cardiovascular:  Negative for chest pain, palpitations and leg swelling.  Gastrointestinal:  Positive for constipation and  diarrhea. Negative for abdominal pain.  Endocrine: Negative for polydipsia.  Skin:  Negative for rash.  Neurological:  Negative for dizziness, weakness and headaches.  Hematological:  Does not bruise/bleed easily.  All other systems reviewed and are negative.     Objective:   Physical Exam Vitals and nursing note reviewed.  Constitutional:      General: She is not in acute distress.    Appearance: Normal appearance. She is well-developed.  HENT:     Head: Normocephalic.     Right Ear: Tympanic membrane normal.     Left Ear: Tympanic membrane normal.     Nose: Nose normal.     Mouth/Throat:     Mouth: Mucous membranes are moist.  Eyes:     Pupils: Pupils are equal, round, and reactive to light.  Neck:     Vascular: No carotid bruit or JVD.  Cardiovascular:     Rate and Rhythm: Normal rate and regular rhythm.     Heart sounds: Normal heart sounds.  Pulmonary:     Effort: Pulmonary effort is normal. No respiratory distress.     Breath sounds: Normal breath sounds. No wheezing or rales.  Chest:     Chest wall: No tenderness.  Abdominal:     General: Bowel sounds are normal. There is no distension or abdominal bruit.     Palpations: Abdomen is soft. There is no hepatomegaly, splenomegaly, mass or pulsatile mass.     Tenderness: There is no abdominal tenderness.  Musculoskeletal:        General: Normal range of motion.     Cervical back: Normal range of motion and neck supple.     Comments: In wheel chair  Lymphadenopathy:     Cervical: No cervical adenopathy.  Skin:    General: Skin is warm and dry.  Neurological:     Mental Status: She is alert and oriented to person, place, and time.     Deep Tendon Reflexes: Reflexes are normal and symmetric.  Psychiatric:        Behavior: Behavior normal.        Thought Content: Thought content normal.        Judgment: Judgment normal.    BP 134/78    Pulse 100    Temp (!) 97.5 F (36.4 C) (Temporal)    Resp 20    SpO2 98%           Assessment & Plan:   MACI EICKHOLT comes in today with chief complaint of No chief complaint on file.   Diagnosis and orders addressed:  1. Migraine with aura and with status migrainosus, not intractable Avoid cafffeine - topiramate (TOPAMAX) 50 MG tablet; TAKE (1) TABLET TWICE  DAILY.  Dispense: 180 tablet; Refill: 1  2. Gastroesophageal reflux disease without esophagitis Avoid spicy foods Do not eat 2 hours prior to bedtime  - pantoprazole (PROTONIX) 40 MG tablet; Take 1 tablet (40 mg total) by mouth daily.  Dispense: 30 tablet; Refill: 3  3. Irritable bowel syndrome with constipation Increase fiber in diet  4. HSP (hereditary spastic paraplegia) (HCC) - diclofenac (VOLTAREN) 75 MG EC tablet; Take 1 tablet (75 mg total) by mouth 2 (two) times daily.  Dispense: 180 tablet; Refill: 0  5. Anxiety state Stress management - escitalopram (LEXAPRO) 10 MG tablet; Take 1 tablet (10 mg total) by mouth daily.  Dispense: 90 tablet; Refill: 1 - ALPRAZolam (XANAX) 0.5 MG tablet; Take 1 tablet (0.5 mg total) by mouth 2 (two) times daily as needed. for anxiety  Dispense: 60 tablet; Refill: 5  6. Iron deficiency anemia, unspecified iron deficiency anemia type - CBC with Differential/Platelet - CMP14+EGFR - Lipid panel  7. TOBACCO ABUSE Smoking cessation encouraged   Labs pending Health Maintenance reviewed Diet and exercise encouraged  Follow up plan: 6 months   Mary-Margaret Hassell Done, FNP

## 2021-08-05 LAB — LIPID PANEL
Chol/HDL Ratio: 3.7 ratio (ref 0.0–4.4)
Cholesterol, Total: 152 mg/dL (ref 100–199)
HDL: 41 mg/dL (ref >39)
LDL Chol Calc (NIH): 81 mg/dL (ref 0–99)
Triglycerides: 172 mg/dL — ABNORMAL HIGH (ref 0–149)
VLDL Cholesterol Cal: 30 mg/dL (ref 5–40)

## 2021-08-05 LAB — CBC WITH DIFFERENTIAL/PLATELET
Basophils Absolute: 0 10*3/uL (ref 0.0–0.2)
Basos: 1 %
EOS (ABSOLUTE): 0.1 10*3/uL (ref 0.0–0.4)
Eos: 3 %
Hematocrit: 39 % (ref 34.0–46.6)
Hemoglobin: 13.4 g/dL (ref 11.1–15.9)
Immature Grans (Abs): 0 10*3/uL (ref 0.0–0.1)
Immature Granulocytes: 0 %
Lymphocytes Absolute: 1.9 10*3/uL (ref 0.7–3.1)
Lymphs: 33 %
MCH: 32.4 pg (ref 26.6–33.0)
MCHC: 34.4 g/dL (ref 31.5–35.7)
MCV: 94 fL (ref 79–97)
Monocytes Absolute: 0.3 10*3/uL (ref 0.1–0.9)
Monocytes: 6 %
Neutrophils Absolute: 3.3 10*3/uL (ref 1.4–7.0)
Neutrophils: 57 %
Platelets: 286 10*3/uL (ref 150–450)
RBC: 4.14 x10E6/uL (ref 3.77–5.28)
RDW: 11.4 % — ABNORMAL LOW (ref 11.7–15.4)
WBC: 5.7 10*3/uL (ref 3.4–10.8)

## 2021-08-05 LAB — CMP14+EGFR
ALT: 35 IU/L — ABNORMAL HIGH (ref 0–32)
AST: 26 IU/L (ref 0–40)
Albumin/Globulin Ratio: 1.9 (ref 1.2–2.2)
Albumin: 4.4 g/dL (ref 3.8–4.8)
Alkaline Phosphatase: 77 IU/L (ref 44–121)
BUN/Creatinine Ratio: 23 (ref 9–23)
BUN: 18 mg/dL (ref 6–24)
Bilirubin Total: 0.2 mg/dL (ref 0.0–1.2)
CO2: 24 mmol/L (ref 20–29)
Calcium: 9.2 mg/dL (ref 8.7–10.2)
Chloride: 105 mmol/L (ref 96–106)
Creatinine, Ser: 0.78 mg/dL (ref 0.57–1.00)
Globulin, Total: 2.3 g/dL (ref 1.5–4.5)
Glucose: 81 mg/dL (ref 70–99)
Potassium: 3.9 mmol/L (ref 3.5–5.2)
Sodium: 141 mmol/L (ref 134–144)
Total Protein: 6.7 g/dL (ref 6.0–8.5)
eGFR: 94 mL/min/{1.73_m2} (ref >59)

## 2021-08-10 ENCOUNTER — Other Ambulatory Visit: Payer: Self-pay | Admitting: Nurse Practitioner

## 2021-08-10 DIAGNOSIS — F411 Generalized anxiety disorder: Secondary | ICD-10-CM

## 2021-08-23 ENCOUNTER — Other Ambulatory Visit: Payer: Self-pay | Admitting: Nurse Practitioner

## 2021-08-23 DIAGNOSIS — G114 Hereditary spastic paraplegia: Secondary | ICD-10-CM

## 2021-08-29 ENCOUNTER — Telehealth: Payer: Self-pay | Admitting: Nurse Practitioner

## 2021-08-29 ENCOUNTER — Other Ambulatory Visit: Payer: Self-pay | Admitting: Nurse Practitioner

## 2021-08-29 DIAGNOSIS — K219 Gastro-esophageal reflux disease without esophagitis: Secondary | ICD-10-CM

## 2021-08-29 MED ORDER — OMEPRAZOLE 40 MG PO CPDR: 40.0000 mg | DELAYED_RELEASE_CAPSULE | Freq: Every day | ORAL | 3 refills | Status: DC

## 2021-08-29 NOTE — Telephone Encounter (Signed)
Protonix changed back to omeprazole

## 2021-08-31 ENCOUNTER — Telehealth: Payer: Self-pay | Admitting: Nurse Practitioner

## 2021-09-01 ENCOUNTER — Other Ambulatory Visit: Payer: Self-pay | Admitting: Nurse Practitioner

## 2021-09-01 DIAGNOSIS — K219 Gastro-esophageal reflux disease without esophagitis: Secondary | ICD-10-CM

## 2021-09-01 MED ORDER — OMEPRAZOLE 40 MG PO CPDR: 40.0000 mg | DELAYED_RELEASE_CAPSULE | Freq: Two times a day (BID) | ORAL | 1 refills | Status: DC

## 2021-09-01 NOTE — Telephone Encounter (Signed)
We will change to bid but insuranc ewill not always pay for it 2x a day.

## 2021-09-22 ENCOUNTER — Other Ambulatory Visit: Payer: Self-pay | Admitting: Nurse Practitioner

## 2021-09-22 DIAGNOSIS — F411 Generalized anxiety disorder: Secondary | ICD-10-CM

## 2021-09-23 ENCOUNTER — Other Ambulatory Visit: Payer: Self-pay | Admitting: *Deleted

## 2021-09-23 DIAGNOSIS — K219 Gastro-esophageal reflux disease without esophagitis: Secondary | ICD-10-CM

## 2021-09-23 MED ORDER — OMEPRAZOLE 40 MG PO CPDR: 40.0000 mg | DELAYED_RELEASE_CAPSULE | Freq: Two times a day (BID) | ORAL | 1 refills | Status: DC

## 2021-10-19 ENCOUNTER — Ambulatory Visit (INDEPENDENT_AMBULATORY_CARE_PROVIDER_SITE_OTHER): Payer: Medicare HMO

## 2021-10-19 VITALS — Wt 117.0 lb

## 2021-10-19 DIAGNOSIS — Z0000 Encounter for general adult medical examination without abnormal findings: Secondary | ICD-10-CM

## 2021-10-19 DIAGNOSIS — Z9181 History of falling: Secondary | ICD-10-CM

## 2021-10-19 DIAGNOSIS — Z609 Problem related to social environment, unspecified: Secondary | ICD-10-CM

## 2021-10-19 DIAGNOSIS — Z5982 Transportation insecurity: Secondary | ICD-10-CM

## 2021-10-19 NOTE — Patient Instructions (Signed)
Tiffany Stewart , Thank you for taking time to come for your Medicare Wellness Visit. I appreciate your ongoing commitment to your health goals. Please review the following plan we discussed and let me know if I can assist you in the future.   Screening recommendations/referrals: Colonoscopy: recommended at age 49 Mammogram: Declined Bone Density: recommended at age 28 - possibly sooner - discuss with Tiffany Stewart Recommended yearly ophthalmology/optometry visit for glaucoma screening and checkup Recommended yearly dental visit for hygiene and checkup  Vaccinations:  declines all vaccines Influenza vaccine: Due Pneumococcal vaccine: Due Tdap vaccine: Due Shingles vaccine: Due  Covid-19: Due  Advanced directives: Advance directive discussed with you today. Even though you declined this today, please call our office should you change your mind, and we can give you the proper paperwork for you to fill out.   Conditions/risks identified: Expect a call from a care guide - hopefully they can help you find a home health aide, transportation and get you involved at [facility] or other program.  Next appointment: Follow up in one year for your annual wellness visit.   Preventive Care 40-64 Years, Female Preventive care refers to lifestyle choices and visits with your health care provider that can promote health and wellness. What does preventive care include? A yearly physical exam. This is also called an annual well check. Dental exams once or twice a year. Routine eye exams. Ask your health care provider how often you should have your eyes checked. Personal lifestyle choices, including: Daily care of your teeth and gums. Regular physical activity. Eating a healthy diet. Avoiding tobacco and drug use. Limiting alcohol use. Practicing safe sex. Taking low-dose aspirin daily starting at age 69. Taking vitamin and mineral supplements as recommended by your health care provider. What happens during an  annual well check? The services and screenings done by your health care provider during your annual well check will depend on your age, overall health, lifestyle risk factors, and family history of disease. Counseling  Your health care provider may ask you questions about your: Alcohol use. Tobacco use. Drug use. Emotional well-being. Home and relationship well-being. Sexual activity. Eating habits. Work and work Statistician. Method of birth control. Menstrual cycle. Pregnancy history. Screening  You may have the following tests or measurements: Height, weight, and BMI. Blood pressure. Lipid and cholesterol levels. These may be checked every 5 years, or more frequently if you are over 8 years old. Skin check. Lung cancer screening. You may have this screening every year starting at age 47 if you have a 30-pack-year history of smoking and currently smoke or have quit within the past 15 years. Fecal occult blood test (FOBT) of the stool. You may have this test every year starting at age 59. Flexible sigmoidoscopy or colonoscopy. You may have a sigmoidoscopy every 5 years or a colonoscopy every 10 years starting at age 28. Hepatitis C blood test. Hepatitis B blood test. Sexually transmitted disease (STD) testing. Diabetes screening. This is done by checking your blood sugar (glucose) after you have not eaten for a while (fasting). You may have this done every 1-3 years. Mammogram. This may be done every 1-2 years. Talk to your health care provider about when you should start having regular mammograms. This may depend on whether you have a family history of breast cancer. BRCA-related cancer screening. This may be done if you have a family history of breast, ovarian, tubal, or peritoneal cancers. Pelvic exam and Pap test. This may be done every  3 years starting at age 43. Starting at age 66, this may be done every 5 years if you have a Pap test in combination with an HPV test. Bone  density scan. This is done to screen for osteoporosis. You may have this scan if you are at high risk for osteoporosis. Discuss your test results, treatment options, and if necessary, the need for more tests with your health care provider. Vaccines  Your health care provider may recommend certain vaccines, such as: Influenza vaccine. This is recommended every year. Tetanus, diphtheria, and acellular pertussis (Tdap, Td) vaccine. You may need a Td booster every 10 years. Zoster vaccine. You may need this after age 48. Pneumococcal 13-valent conjugate (PCV13) vaccine. You may need this if you have certain conditions and were not previously vaccinated. Pneumococcal polysaccharide (PPSV23) vaccine. You may need one or two doses if you smoke cigarettes or if you have certain conditions. Talk to your health care provider about which screenings and vaccines you need and how often you need them. This information is not intended to replace advice given to you by your health care provider. Make sure you discuss any questions you have with your health care provider. Document Released: 08/27/2015 Document Revised: 04/19/2016 Document Reviewed: 06/01/2015 Elsevier Interactive Patient Education  2017 [place] Prevention in the Home Falls can cause injuries. They can happen to people of all ages. There are many things you can do to make your home safe and to help prevent falls. What can I do on the outside of my home? Regularly fix the edges of walkways and driveways and fix any cracks. Remove anything that might make you trip as you walk through a door, such as a raised step or threshold. Trim any bushes or trees on the path to your home. Use bright outdoor lighting. Clear any walking paths of anything that might make someone trip, such as rocks or tools. Regularly check to see if handrails are loose or broken. Make sure that both sides of any steps have handrails. Any raised decks and  porches should have guardrails on the edges. Have any leaves, snow, or ice cleared regularly. Use sand or salt on walking paths during winter. Clean up any spills in your garage right away. This includes oil or grease spills. What can I do in the bathroom? Use night lights. Install grab bars by the toilet and in the tub and shower. Do not use towel bars as grab bars. Use non-skid mats or decals in the tub or shower. If you need to sit down in the shower, use a plastic, non-slip stool. Keep the floor dry. Clean up any water that spills on the floor as soon as it happens. Remove soap buildup in the tub or shower regularly. Attach bath mats securely with double-sided non-slip rug tape. Do not have throw rugs and other things on the floor that can make you trip. What can I do in the bedroom? Use night lights. Make sure that you have a light by your bed that is easy to reach. Do not use any sheets or blankets that are too big for your bed. They should not hang down onto the floor. Have a firm chair that has side arms. You can use this for support while you get dressed. Do not have throw rugs and other things on the floor that can make you trip. What can I do in the kitchen? Clean up any spills right away. Avoid walking on wet  floors. Keep items that you use a lot in easy-to-reach places. If you need to reach something above you, use a strong step stool that has a grab bar. Keep electrical cords out of the way. Do not use floor polish or wax that makes floors slippery. If you must use wax, use non-skid floor wax. Do not have throw rugs and other things on the floor that can make you trip. What can I do with my stairs? Do not leave any items on the stairs. Make sure that there are handrails on both sides of the stairs and use them. Fix handrails that are broken or loose. Make sure that handrails are as long as the stairways. Check any carpeting to make sure that it is firmly attached to the  stairs. Fix any carpet that is loose or worn. Avoid having throw rugs at the top or bottom of the stairs. If you do have throw rugs, attach them to the floor with carpet tape. Make sure that you have a light switch at the top of the stairs and the bottom of the stairs. If you do not have them, ask someone to add them for you. What else can I do to help prevent falls? Wear shoes that: Do not have high heels. Have rubber bottoms. Are comfortable and fit you well. Are closed at the toe. Do not wear sandals. If you use a stepladder: Make sure that it is fully opened. Do not climb a closed stepladder. Make sure that both sides of the stepladder are locked into place. Ask someone to hold it for you, if possible. Clearly mark and make sure that you can see: Any grab bars or handrails. First and last steps. Where the edge of each step is. Use tools that help you move around (mobility aids) if they are needed. These include: Canes. Walkers. Scooters. Crutches. Turn on the lights when you go into a dark area. Replace any light bulbs as soon as they burn out. Set up your furniture so you have a clear path. Avoid moving your furniture around. If any of your floors are uneven, fix them. If there are any pets around you, be aware of where they are. Review your medicines with your doctor. Some medicines can make you feel dizzy. This can increase your chance of falling. Ask your doctor what other things that you can do to help prevent falls. This information is not intended to replace advice given to you by your health care provider. Make sure you discuss any questions you have with your health care provider. Document Released: 05/27/2009 Document Revised: 01/06/2016 Document Reviewed: 09/04/2014 Elsevier Interactive Patient Education  2017 [employer].

## 2021-10-19 NOTE — Progress Notes (Signed)
Subjective:   Tiffany Stewart is a 51 y.o. female who presents for Medicare Annual (Subsequent) preventive examination.  Virtual Visit via Telephone Note  I connected with  Tiffany Stewart on 10/19/21 at 12:00 PM EST by telephone and verified that I am speaking with the correct person using two identifiers.  Location: Patient: Home Provider: [facility] Persons participating in the virtual visit: patient/Nurse Health Advisor   I discussed the limitations, risks, security and privacy concerns of performing an evaluation and management service by telephone and the availability of in person appointments. The patient expressed understanding and agreed to proceed.  Interactive audio and video telecommunications were attempted between this nurse and patient, however failed, due to patient having technical difficulties OR patient did not have access to video capability.  We continued and completed visit with audio only.  Some vital signs may be absent or patient reported.   Omeka Holben E Brit Wernette, LPN   Review of Systems     Cardiac Risk Factors include: smoking/ tobacco exposure;sedentary lifestyle     Objective:    Today's Vitals   10/19/21 0829  Weight: 117 lb (53.1 kg)  PainSc: 5    Body mass index is 22.85 kg/m.  Advanced Directives 10/19/2021 10/18/2020 08/29/2020 08/28/2020 10/13/2019 04/05/2018 04/05/2018  Does Patient Have a Medical Advance Directive? Yes No No Unable to assess, patient is non-responsive or altered mental status No No No  Type of Advance Directive [place] in Chart? No - copy requested - - - - - -  Would patient like information on creating a medical advance directive? - No - Guardian declined No - Patient declined - No - Patient declined No - Patient declined No - Patient declined    Current Medications (verified) Outpatient Encounter Medications as of 10/19/2021  Medication Sig   acetaminophen (TYLENOL) 500 MG  tablet Take 1,500 mg by mouth 2 (two) times daily as needed for headache.    ALPRAZolam (XANAX) 0.5 MG tablet Take 1 tablet (0.5 mg total) by mouth 2 (two) times daily as needed. for anxiety   Aspirin-Acetaminophen-Caffeine (GOODY HEADACHE PO) Take 1 Package by mouth daily as needed.   baclofen (LIORESAL) 10 MG tablet Take 1 tablet (10 mg total) by mouth 2 (two) times daily.   diclofenac (VOLTAREN) 75 MG EC tablet Take 1 tablet (75 mg total) by mouth 2 (two) times daily.   diclofenac Sodium (VOLTAREN) 1 % GEL Apply 4 g topically 4 (four) times daily.   escitalopram (LEXAPRO) 10 MG tablet TAKE 1 TABLET EVERY DAY   omeprazole (PRILOSEC) 40 MG capsule Take 1 capsule (40 mg total) by mouth 2 (two) times daily.   topiramate (TOPAMAX) 50 MG tablet TAKE (1) TABLET TWICE DAILY.   No facility-administered encounter medications on file as of 10/19/2021.    Allergies (verified) Hctz [hydrochlorothiazide], Morphine, and Zoloft [sertraline hcl]   History: Past Medical History:  Diagnosis Date   Anxiety    Chronic pain    Depression    GERD (gastroesophageal reflux disease)    HSP (hereditary spastic paraplegia) (HCC)    Migraines    Perforated peptic ulcer ([place]) 03/17/2018   Goody powder overuse   Past Surgical History:  Procedure Laterality Date   CESAREAN SECTION     CHOLECYSTECTOMY     ENDOMETRIAL ABLATION  2000   GASTRORRHAPHY  03/17/2018   Procedure: GASTRORRHAPHY;  Surgeon: Aviva Signs, MD;  Location:  [facility];  Service: General;;   HERNIA REPAIR     LAPAROTOMY N/A 03/17/2018   Procedure: EXPLORATORY LAPAROTOMY;  Surgeon: Aviva Signs, MD;  Location: [facility];  Service: General;  Laterality: N/A;   Family History  Problem Relation Age of Onset   Diabetes Father    Cancer Father        lung cancer   Heart disease Father    Heart disease Maternal Grandmother    Stroke Maternal Grandfather    Diabetes Paternal Grandmother    Colon cancer Neg Hx    Social History   Socioeconomic  History   Marital status: Single    Spouse name: Not on file   Number of children: 3   Years of education: 10   Highest education level: 10th grade  Occupational History   Occupation: disabled  Tobacco Use   Smoking status: Former    Packs/day: 0.25    Years: 27.00    Pack years: 6.75    Types: Cigarettes    Quit date: 01/2020    Years since quitting: 1.7   Smokeless tobacco: Never  Vaping Use   Vaping Use: Every day   Start date: 01/13/2020  Substance and Sexual Activity   Alcohol use: No   Drug use: No   Sexual activity: Not Currently    Birth control/protection: None  Other Topics Concern   Not on file  Social History Narrative   Lives with daughter and son in law   Social Determinants of Health   Financial Resource Strain: Low Risk    Difficulty of Paying Living Expenses: Not very hard  Food Insecurity: No Food Insecurity   Worried About Charity fundraiser in the Last Year: Never true   Ran Out of Food in the Last Year: Never true  Transportation Needs: Unmet Transportation Needs   Lack of Transportation (Medical): Yes   Lack of Transportation (Non-Medical): No  Physical Activity: Inactive   Days of Exercise per Week: 0 days   Minutes of Exercise per Session: 0 min  Stress: Stress Concern Present   Feeling of Stress : To some extent  Social Connections: Socially Isolated   Frequency of Communication with Friends and Family: More than three times a week   Frequency of Social Gatherings with Friends and Family: More than three times a week   Attends Religious Services: Never   Marine scientist or Organizations: No   Attends Music therapist: Never   Marital Status: Never married    Tobacco Counseling Counseling given: Not Answered   Clinical Intake:  Pre-visit preparation completed: Yes  Pain : 0-10 Pain Score: 5  Pain Type: Chronic pain Pain Location: Knee Pain Orientation: Right, Left Pain Descriptors / Indicators: Aching,  Sore Pain Onset: More than a month ago Pain Frequency: Intermittent     BMI - recorded: 22.85 Nutritional Status: BMI of 19-24  Normal Nutritional Risks: None Diabetes: No  How often do you need to have someone help you when you read instructions, pamphlets, or other written materials from your doctor or pharmacy?: 1 - Never  Diabetic? no  Interpreter Needed?: No  Information entered by :: Jeffrey Graefe, LPN   Activities of Daily Living In your present state of health, do you have any difficulty performing the following activities: 10/19/2021  Hearing? N  Vision? N  Difficulty concentrating or making decisions? N  Walking or climbing stairs? Y  Dressing or bathing? N  Doing errands, shopping? Darreld Mclean  Preparing Food and eating ? Y  Using the Toilet? N  In the past six months, have you accidently leaked urine? N  Do you have problems with loss of bowel control? N  Managing your Medications? N  Managing your Finances? N  Housekeeping or managing your Housekeeping? Y  Some recent data might be hidden    Patient Care Team: Chevis Pretty, FNP as PCP - General (Nurse Practitioner) Danie Binder, MD (Inactive) as Consulting Physician (Gastroenterology)  Indicate any recent Medical Services you may have received from other than [facility] providers in the past year (date may be approximate).     Assessment:   This is a routine wellness examination for Tiffany Stewart.  Hearing/Vision screen Hearing Screening - Comments:: Denies hearing difficulties   Vision Screening - Comments:: Wears rx glasses prn only - up to date with routine eye exams with [facility]  Dietary issues and exercise activities discussed: Current Exercise Habits: The patient does not participate in regular exercise at present, Exercise limited by: orthopedic condition(s);neurologic condition(s)   Goals Addressed             This Visit's Progress    Patient Stated   On track    10/18/2020 AWV Goal: Improved  Nutrition/Diet  Patient will verbalize understanding that diet plays an important role in overall health and that a poor diet is a risk factor for many chronic medical conditions.  Over the next year, patient will improve self management of their diet by incorporating fewer sweetened foods & beverages and better food choices. Patient will utilize available community resources to help with food acquisition if needed (ex: food pantries, [address], etc) Patient will work with nutrition specialist if a referral was made        Depression Screen PHQ 2/9 Scores 10/19/2021 08/04/2021 03/03/2021 11/11/2020 10/22/2020 10/18/2020 08/03/2020  PHQ - 2 Score 1 0 0 0 0 0 0  PHQ- 9 Score - 0 0 - - - -    Fall Risk Fall Risk  10/19/2021 08/04/2021 03/03/2021 11/11/2020 10/22/2020  Falls in the past year? 1 0 0 Exclusion - non ambulatory 0  Comment - - - - -  Number falls in past yr: 0 - - - -  Injury with Fall? 0 - - - -  Risk for fall due to : History of fall(s);Impaired mobility;Orthopedic patient;Other (Comment) - - - -  Risk for fall due to: Comment spastic hemiplegia - - - -  Follow up Education provided;Falls prevention discussed - - - -    FALL RISK PREVENTION PERTAINING TO THE HOME:  Any stairs in or around the home? No  If so, are there any without handrails? No  Home free of loose throw rugs in walkways, pet beds, electrical cords, etc? Yes  Adequate lighting in your home to reduce risk of falls? Yes   ASSISTIVE DEVICES UTILIZED TO PREVENT FALLS:  Life alert? No  Use of a cane, walker or w/c? Yes  Grab bars in the bathroom? Yes  Shower chair or bench in shower? No  Elevated toilet seat or a handicapped toilet? Yes   TIMED UP AND GO:  Was the test performed? No . Telephonic visit - patient is non-ambulatory  Cognitive Function: Normal cognitive status assessed by direct observation by this Nurse Health Advisor. No abnormalities found.       6CIT Screen 10/18/2020 10/13/2019  What Year? 0  points 0 points  What month? 0 points 0 points  What time? 0  points 0 points  Count back from 20 0 points 0 points  Months in reverse 0 points 0 points  Repeat phrase 0 points 0 points  Total Score 0 0    Immunizations  There is no immunization history on file for this patient.  TDAP status: Due, Education has been provided regarding the importance of this vaccine. Advised may receive this vaccine at local pharmacy or Health Dept. Aware to provide a copy of the vaccination record if obtained from local pharmacy or Health Dept. Verbalized acceptance and understanding.  Flu Vaccine status: Declined, Education has been provided regarding the importance of this vaccine but patient still declined. Advised may receive this vaccine at local pharmacy or Health Dept. Aware to provide a copy of the vaccination record if obtained from local pharmacy or Health Dept. Verbalized acceptance and understanding.  Pneumococcal vaccine status: Declined,  Education has been provided regarding the importance of this vaccine but patient still declined. Advised may receive this vaccine at local pharmacy or Health Dept. Aware to provide a copy of the vaccination record if obtained from local pharmacy or Health Dept. Verbalized acceptance and understanding.   Covid-19 vaccine status: Declined, Education has been provided regarding the importance of this vaccine but patient still declined. Advised may receive this vaccine at local pharmacy or Health Dept.or vaccine clinic. Aware to provide a copy of the vaccination record if obtained from local pharmacy or Health Dept. Verbalized acceptance and understanding.  Qualifies for Shingles Vaccine? Yes   Zostavax completed No   Shingrix Completed?: No.    Education has been provided regarding the importance of this vaccine. Patient has been advised to call insurance company to determine out of pocket expense if they have not yet received this vaccine. Advised may also receive  vaccine at local pharmacy or Health Dept. Verbalized acceptance and understanding.  Screening Tests Health Maintenance  Topic Date Due   COVID-19 Vaccine (1) Never done   PAP SMEAR-Modifier  Never done   INFLUENZA VACCINE  11/11/2021 (Originally 03/14/2021)   COLONOSCOPY (Pts 45-11yr Insurance coverage will need to be confirmed)  03/03/2022 (Originally 11/30/2017)   Hepatitis C Screening  08/04/2022 (Originally [birth year]01[birth year]1990)   TETANUS/TDAP  11/13/2022   HIV Screening  Completed   HPV VACCINES  Aged Out    Health Maintenance  Health Maintenance Due  Topic Date Due   COVID-19 Vaccine (1) Never done   PAP SMEAR-Modifier  Never done    Colonoscopy recommended at age 51 Mammogram status: No longer required due to she declines.  DEXA recommended at age 51 Lung Cancer Screening: (Low Dose CT Chest recommended if Age 49-80years, 30 pack-year currently smoking OR have quit w/in 15years.) does not qualify.   Additional Screening:  Hepatitis C Screening: does not qualify  Vision Screening: Recommended annual ophthalmology exams for early detection of glaucoma and other disorders of the eye. Is the patient up to date with their annual eye exam?  Yes  Who is the provider or what is the name of the office in which the patient attends annual [place] exams[place] not established with a provider, would they like to be referred to a provider to establish care? No .   Dental Screening: Recommended annual dental exams for proper oral hygiene  Community Resource Referral / Chronic Care Management: CRR required this visit?  Yes   CCM required this visit?  No      Plan:     I have  personally reviewed and noted the following in the patients chart:   Medical and social history Use of alcohol, tobacco or illicit drugs  Current medications and supplements including opioid prescriptions.  Functional ability and status Nutritional status Physical activity Advanced  directives List of other physicians Hospitalizations, surgeries, and ER visits in previous 12 months Vitals Screenings to include cognitive, depression, and falls Referrals and appointments  In addition, I have reviewed and discussed with patient certain preventive protocols, quality metrics, and best practice recommendations. A written personalized care plan for preventive services as well as general preventive health recommendations were provided to patient.     Sandrea Hammond, LPN   10/16/3612   Nurse Notes: CRR to assist with getting a life alert, refer to social group, and assist with transportation

## 2021-10-21 ENCOUNTER — Telehealth: Payer: Self-pay | Admitting: *Deleted

## 2021-10-21 NOTE — Telephone Encounter (Signed)
? ?  Telephone encounter was:  Unsuccessful.  10/21/2021 ?Name: Tiffany Stewart MRN: [id number] DOB: 1971-02-09 ? ?Unsuccessful outbound call made today to assist with:  Transportation Needs , Home Modifications, and Socialization ? ?Outreach Attempt:  1st Attempt ? ?A HIPAA compliant voice message was left requesting a return call.  Instructed patient to call back at   Instructed patient to call back at [phone]  at their earliest convenience.  ? ? ?Lovett Sox -Selinda Eon ?Care Guide , Embedded Care Coordination ?[facility], Care Management  ?[phone] ?[address] , [place] [address] ?Email : [name] [email or web address] ?  ?

## 2021-10-28 ENCOUNTER — Telehealth: Payer: Self-pay | Admitting: *Deleted

## 2021-10-28 NOTE — Telephone Encounter (Signed)
? ?  Telephone encounter was:  Unsuccessful.  10/28/2021 ?Name: Tiffany Stewart MRN: [id number] DOB: May 31, 1971 ? ?Unsuccessful outbound call made today to assist with:  Transportation Needs  Socialization ? ?Outreach Attempt:  2nd Attempt ? ?A HIPAA compliant voice message was left requesting a return call.  Instructed patient to call back at   Instructed patient to call back at [phone]  at their earliest convenience. . ? ?[name] ?Care Guide , Embedded Care Coordination ?[facility], Care Management  ?[phone] ?[address] , [place] [address] ?Email : [name] [email or web address] ?  ? ?

## 2021-10-31 ENCOUNTER — Other Ambulatory Visit: Payer: Self-pay | Admitting: Nurse Practitioner

## 2021-10-31 ENCOUNTER — Telehealth: Payer: Self-pay | Admitting: *Deleted

## 2021-10-31 DIAGNOSIS — G114 Hereditary spastic paraplegia: Secondary | ICD-10-CM

## 2021-10-31 NOTE — Telephone Encounter (Signed)
? ?  Telephone encounter was:  Unsuccessful.  10/31/2021 ?Name: Tiffany Stewart MRN: [id number] DOB: 1970/12/17 ? ?Unsuccessful outbound call made today to assist with:  Transportation Needs , Home Modifications, and Caregiver Stress ? ?Outreach Attempt:  3rd Attempt.  Referral closed unable to contact patient. ? ?A HIPAA compliant voice message was left requesting a return call.  Instructed patient to call back at   Instructed patient to call back at [phone]  at their earliest convenience. [name] ?3x called no responcse to [facility] protected messages ?One call someone picked up and then promptly hung up  ? ?Jeniya Flannigan Greenauer -Selinda Eon ?Care Guide , Embedded Care Coordination ?[facility], Care Management  ?[phone] ?[address] , [place] [address] ?Email : [name] [email or web address] ?  ?

## 2021-11-01 ENCOUNTER — Encounter: Payer: Self-pay | Admitting: Nurse Practitioner

## 2021-11-01 ENCOUNTER — Ambulatory Visit (INDEPENDENT_AMBULATORY_CARE_PROVIDER_SITE_OTHER): Payer: Medicare HMO | Admitting: Nurse Practitioner

## 2021-11-01 VITALS — BP 136/95 | HR 82 | Temp 98.0°F | Resp 20

## 2021-11-01 DIAGNOSIS — G43101 Migraine with aura, not intractable, with status migrainosus: Secondary | ICD-10-CM | POA: Diagnosis not present

## 2021-11-01 DIAGNOSIS — G114 Hereditary spastic paraplegia: Secondary | ICD-10-CM

## 2021-11-01 DIAGNOSIS — F411 Generalized anxiety disorder: Secondary | ICD-10-CM

## 2021-11-01 DIAGNOSIS — D509 Iron deficiency anemia, unspecified: Secondary | ICD-10-CM

## 2021-11-01 DIAGNOSIS — K219 Gastro-esophageal reflux disease without esophagitis: Secondary | ICD-10-CM

## 2021-11-01 DIAGNOSIS — K581 Irritable bowel syndrome with constipation: Secondary | ICD-10-CM | POA: Diagnosis not present

## 2021-11-01 MED ORDER — TOPIRAMATE 50 MG PO TABS: ORAL_TABLET | ORAL | 1 refills | Status: DC

## 2021-11-01 MED ORDER — BACLOFEN 10 MG PO TABS
10.0000 mg | ORAL_TABLET | Freq: Two times a day (BID) | ORAL | 1 refills | Status: DC
Start: 2021-11-01 — End: 2022-05-11

## 2021-11-01 MED ORDER — ESCITALOPRAM OXALATE 10 MG PO TABS: 10.0000 mg | ORAL_TABLET | Freq: Every day | ORAL | 1 refills | Status: DC

## 2021-11-01 MED ORDER — OMEPRAZOLE 40 MG PO CPDR: 40.0000 mg | DELAYED_RELEASE_CAPSULE | Freq: Two times a day (BID) | ORAL | 1 refills | Status: DC

## 2021-11-01 MED ORDER — ALPRAZOLAM 0.5 MG PO TABS: 0.5000 mg | ORAL_TABLET | Freq: Two times a day (BID) | ORAL | 5 refills | Status: DC | PRN

## 2021-11-01 NOTE — Progress Notes (Signed)
? ?Subjective:  ? ? Patient ID: Tiffany Stewart, female    DOB: 1971/08/06, 51 y.o.   MRN: [id number] ? ? ?Chief Complaint: medical management of chronic issues  ?  ? ?HPI: ? ?Tiffany Stewart is a 51 y.o. who identifies as a female who was assigned female at birth.  ? ?Social history: ?Lives with: her daughter and ome of her grand children ?Work history: disability ? ? ?Comes in today for follow up of the following chronic medical issues: ? ?1. Gastroesophageal reflux disease without esophagitis ?Is on omeprazole daily. Has had gi  bleed in  the past. Is doing well. ? ?2. Irritable bowel syndrome with constipation ?Has occasional flare ups of diarrhea, but overall doing well. ? ?3. HSP (hereditary spastic paraplegia) ([place]) ?She is always in her wheel chair. She is able to stand for sort periods of time to transfer herself out of her wheel chair to bed or to the toilet. ? ?4. Iron deficiency anemia, unspecified iron deficiency anemia type ?Is on iron daily ?Lab Results  ?Component Value Date  ? HGB 13.4 08/04/2021  ? ? ? ?5. Anxiety state ?Is on xanax bid- gets very anxious ?GAD 7 : Generalized Anxiety Score 11/01/2021 08/04/2021 03/03/2021 08/03/2020  ?Nervous, Anxious, on Edge 1 0 0 0  ?Control/stop worrying 1 0 0 0  ?Worry too much - different things 1 0 0 0  ?Trouble relaxing 1 0 0 0  ?Restless 0 0 0 0  ?Easily annoyed or irritable 0 0 0 0  ?Afraid - awful might happen 1 0 0 0  ?Total GAD 7 Score 5 0 0 0  ?Anxiety Difficulty Not difficult at all Not difficult at all Not difficult at all Not difficult at all  ? ? ? ? ? ?New complaints: ?None today ? ?Allergies  ?Allergen Reactions  ? Hctz [Hydrochlorothiazide] Diarrhea  ? Morphine Other (See Comments)  ?  Flushing, turns skin red   ? Zoloft [Sertraline Hcl] Other (See Comments)  ?  Spaced out  ? ?Outpatient Encounter Medications as of 11/01/2021  ?Medication Sig  ? acetaminophen (TYLENOL) 500 MG tablet Take 1,500 mg by mouth 2 (two) times daily as needed for headache.   ?  ALPRAZolam (XANAX) 0.5 MG tablet Take 1 tablet (0.5 mg total) by mouth 2 (two) times daily as needed. for anxiety  ? Aspirin-Acetaminophen-Caffeine (GOODY HEADACHE PO) Take 1 Package by mouth daily as needed.  ? baclofen (LIORESAL) 10 MG tablet Take 1 tablet (10 mg total) by mouth 2 (two) times daily.  ? diclofenac (VOLTAREN) 75 MG EC tablet TAKE 1 TABLET TWICE DAILY  ? diclofenac Sodium (VOLTAREN) 1 % GEL Apply 4 g topically 4 (four) times daily.  ? escitalopram (LEXAPRO) 10 MG tablet TAKE 1 TABLET EVERY DAY  ? omeprazole (PRILOSEC) 40 MG capsule Take 1 capsule (40 mg total) by mouth 2 (two) times daily.  ? topiramate (TOPAMAX) 50 MG tablet TAKE (1) TABLET TWICE DAILY.  ? ?No facility-administered encounter medications on file as of 11/01/2021.  ? ? ?Past Surgical History:  ?Procedure Laterality Date  ? CESAREAN SECTION    ? CHOLECYSTECTOMY    ? ENDOMETRIAL ABLATION  2000  ? GASTRORRHAPHY  03/17/2018  ? Procedure: GASTRORRHAPHY;  Surgeon: Aviva Signs, MD;  Location: [facility];  Service: General;;  ? HERNIA REPAIR    ? LAPAROTOMY N/A 03/17/2018  ? Procedure: EXPLORATORY LAPAROTOMY;  Surgeon: Aviva Signs, MD;  Location: [facility];  Service: General;  Laterality: N/A;  ? ? ?  Family History  ?Problem Relation Age of Onset  ? Diabetes Father   ? Cancer Father   ?     lung cancer  ? Heart disease Father   ? Heart disease Maternal Grandmother   ? Stroke Maternal Grandfather   ? Diabetes Paternal Grandmother   ? Colon cancer Neg Hx   ? ? ? ? ?Controlled substance contract: 11/01/21 ? ? ? ? ?Review of Systems  ?Constitutional:  Negative for diaphoresis.  ?Eyes:  Negative for pain.  ?Respiratory:  Negative for shortness of breath.   ?Cardiovascular:  Negative for chest pain, palpitations and leg swelling.  ?Gastrointestinal:  Negative for abdominal pain.  ?Endocrine: Negative for polydipsia.  ?Skin:  Negative for rash.  ?Neurological:  Negative for dizziness, weakness and headaches.  ?Hematological:  Does not bruise/bleed easily.   ?All other systems reviewed and are negative. ? ?   ?Objective:  ? Physical Exam ?Vitals reviewed.  ?Constitutional:   ?   Appearance: Normal appearance.  ?Cardiovascular:  ?   Rate and Rhythm: Normal rate and regular rhythm.  ?Pulmonary:  ?   Effort: Pulmonary effort is normal.  ?   Breath sounds: Normal breath sounds.  ?Musculoskeletal:  ?   Comments: Is sitting her her wheel chair  ?Skin: ?   General: Skin is warm.  ?Neurological:  ?   General: No focal deficit present.  ?   Mental Status: She is alert and oriented to person, place, and time.  ?Psychiatric:     ?   Mood and Affect: Mood normal.     ?   Behavior: Behavior normal.  ? ? ?BP (!) 136/95   Pulse 82   Temp 98 ?F (36.7 ?C) (Temporal)   Resp 20   SpO2 98%  ? ? ? ?   ?Assessment & Plan:  ?Tiffany Stewart in today with chief complaint of Medical Management of Chronic Issues ? ? ?1. Gastroesophageal reflux disease without esophagitis ?Avoid spicy foods ?Do not eat 2 hours prior to bedtime ? ?- omeprazole (PRILOSEC) 40 MG capsule; Take 1 capsule (40 mg total) by mouth 2 (two) times daily.  Dispense: 180 capsule; Refill: 1 ? ?2. Irritable bowel syndrome with constipation ?Watch diet to prevent flare up ? ?3. HSP (hereditary spastic paraplegia) ([place]) ?Needs  to get out of wheel chair several times a day ? ?4. Iron deficiency anemia, unspecified iron deficiency anemia type ?Daily iron tablet ? ?5. Anxiety state ?Sress management ?- ToxASSURE Select 13 (MW), Urine ?- escitalopram (LEXAPRO) 10 MG tablet; Take 1 tablet (10 mg total) by mouth daily.  Dispense: 90 tablet; Refill: 1 ?- ALPRAZolam (XANAX) 0.5 MG tablet; Take 1 tablet (0.5 mg total) by mouth 2 (two) times daily as needed. for anxiety  Dispense: 60 tablet; Refill: 5 ? ?6. Migraine with aura and with status migrainosus, not intractable ?- topiramate (TOPAMAX) 50 MG tablet; TAKE (1) TABLET TWICE DAILY.  Dispense: 180 tablet; Refill: 1 ? ? ? ?The above assessment and management plan was discussed with the  patient. The patient verbalized understanding of and has agreed to the management plan. Patient is aware to call the clinic if symptoms persist or worsen. Patient is aware when to return to the clinic for a follow-up visit. Patient educated on when it is appropriate to go to the emergency department.  ? ?Mary-Margaret Hassell Done, FNP ? ? ? ?

## 2021-11-01 NOTE — Patient Instructions (Signed)

## 2021-11-08 ENCOUNTER — Telehealth: Payer: Self-pay | Admitting: Nurse Practitioner

## 2021-11-08 NOTE — Telephone Encounter (Signed)
Please advise lab look like it was just a tox. ?

## 2021-11-09 LAB — TOXASSURE SELECT 13 (MW), URINE

## 2022-03-10 ENCOUNTER — Other Ambulatory Visit: Payer: Self-pay | Admitting: Nurse Practitioner

## 2022-03-10 DIAGNOSIS — G43101 Migraine with aura, not intractable, with status migrainosus: Secondary | ICD-10-CM

## 2022-03-26 ENCOUNTER — Other Ambulatory Visit: Payer: Self-pay | Admitting: Nurse Practitioner

## 2022-03-26 DIAGNOSIS — G114 Hereditary spastic paraplegia: Secondary | ICD-10-CM

## 2022-04-27 ENCOUNTER — Other Ambulatory Visit: Payer: Self-pay | Admitting: Nurse Practitioner

## 2022-04-27 DIAGNOSIS — F411 Generalized anxiety disorder: Secondary | ICD-10-CM

## 2022-05-04 ENCOUNTER — Ambulatory Visit: Payer: Medicare HMO | Admitting: Nurse Practitioner

## 2022-05-04 NOTE — Progress Notes (Deleted)
Subjective:    Patient ID: Tiffany Stewart, female    DOB: 12-02-1970, 51 y.o.   MRN: [id number]   Chief Complaint: medical management of chronic issues.   HPI:  Tiffany Stewart is a 51 y.o. who identifies as a female who was assigned female at birth.   Social history: Lives with: daughter and grandchildren Work history: disability   Comes in today for follow up of the following chronic medical issues:  1. Gastroesophageal reflux disease without esophagitis Takes omeprazole daily; h/o GIB; currently doing well.  2. Irritable bowel syndrome with constipation ***Occasional flare up of diarrhea, doing well overall.  3. HSP (hereditary spastic paraplegia) ([facility]) ***wheelchair bound, but is able to stand for short periods of time to transfer to/from bed or toilet.  4. Iron deficiency anemia, unspecified iron deficiency anemia type Takes iron daily. No recent issues. Lab Results  Component Value Date   WBC 5.7 08/04/2021   HGB 13.4 08/04/2021   HCT 39.0 08/04/2021   MCV 94 08/04/2021   PLT 286 08/04/2021     5. Anxiety state ***On xanax BID; gets anxious easily.    11/01/2021    4:12 PM 08/04/2021    4:00 PM 03/03/2021   10:00 AM 08/03/2020    3:09 PM  GAD 7 : Generalized Anxiety Score  Nervous, Anxious, on Edge 1 0 0 0  Control/stop worrying 1 0 0 0  Worry too much - different things 1 0 0 0  Trouble relaxing 1 0 0 0  Restless 0 0 0 0  Easily annoyed or irritable 0 0 0 0  Afraid - awful might happen 1 0 0 0  Total GAD 7 Score 5 0 0 0  Anxiety Difficulty Not difficult at all Not difficult at all Not difficult at all Not difficult at all       New complaints: ***  Allergies  Allergen Reactions   Hctz [Hydrochlorothiazide] Diarrhea   Morphine Other (See Comments)    Flushing, turns skin red    Zoloft [Sertraline Hcl] Other (See Comments)    Spaced out   Outpatient Encounter Medications as of 05/04/2022  Medication Sig   acetaminophen (TYLENOL) 500 MG tablet  Take 1,500 mg by mouth 2 (two) times daily as needed for headache.    ALPRAZolam (XANAX) 0.5 MG tablet Take 1 tablet (0.5 mg total) by mouth 2 (two) times daily as needed. for anxiety   Aspirin-Acetaminophen-Caffeine (GOODY HEADACHE PO) Take 1 Package by mouth daily as needed.   baclofen (LIORESAL) 10 MG tablet Take 1 tablet (10 mg total) by mouth 2 (two) times daily.   diclofenac (VOLTAREN) 75 MG EC tablet TAKE 1 TABLET TWICE DAILY   diclofenac Sodium (VOLTAREN) 1 % GEL Apply 4 g topically 4 (four) times daily.   escitalopram (LEXAPRO) 10 MG tablet TAKE 1 TABLET EVERY DAY   omeprazole (PRILOSEC) 40 MG capsule Take 1 capsule (40 mg total) by mouth 2 (two) times daily.   topiramate (TOPAMAX) 50 MG tablet TAKE (1) TABLET TWICE DAILY.   No facility-administered encounter medications on file as of 05/04/2022.    Past Surgical History:  Procedure Laterality Date   CESAREAN SECTION     CHOLECYSTECTOMY     ENDOMETRIAL ABLATION  2000   GASTRORRHAPHY  03/17/2018   Procedure: GASTRORRHAPHY;  Surgeon: Aviva Signs, MD;  Location: [facility];  Service: General;;   HERNIA REPAIR     LAPAROTOMY N/A 03/17/2018   Procedure: EXPLORATORY LAPAROTOMY;  Surgeon: Aviva Signs,  MD;  Location: [facility];  Service: General;  Laterality: N/A;    Family History  Problem Relation Age of Onset   Diabetes Father    Cancer Father        lung cancer   Heart disease Father    Heart disease Maternal Grandmother    Stroke Maternal Grandfather    Diabetes Paternal Grandmother    Colon cancer Neg Hx       Controlled substance contract: ***     Review of Systems  Constitutional:  Negative for diaphoresis.  Eyes:  Negative for pain.  Respiratory:  Negative for shortness of breath.   Cardiovascular:  Negative for chest pain, palpitations and leg swelling.  Gastrointestinal:  Negative for abdominal pain.  Endocrine: Negative for polydipsia.  Skin:  Negative for rash.  Neurological:  Negative for dizziness and  headaches.  Hematological:  Does not bruise/bleed easily.  All other systems reviewed and are negative.      Objective:   Physical Exam Vitals and nursing note reviewed.  Constitutional:      General: She is not in acute distress.    Appearance: Normal appearance.  HENT:     Head: Normocephalic and atraumatic.     Right Ear: Tympanic membrane normal.     Left Ear: Tympanic membrane normal.     Nose: Nose normal.     Mouth/Throat:     Mouth: Mucous membranes are moist.     Pharynx: Oropharynx is clear.  Eyes:     Conjunctiva/sclera: Conjunctivae normal.     Pupils: Pupils are equal, round, and reactive to light.  Neck:     Thyroid: No thyroid mass or thyromegaly.     Vascular: No carotid bruit or JVD.  Cardiovascular:     Rate and Rhythm: Normal rate and regular rhythm.     Pulses: Normal pulses.  Pulmonary:     Effort: Pulmonary effort is normal.     Breath sounds: Normal breath sounds. No wheezing, rhonchi or rales.  Chest:     Chest wall: No tenderness.  Abdominal:     General: Bowel sounds are normal. There is no distension.     Palpations: Abdomen is soft.     Tenderness: There is no abdominal tenderness.  Musculoskeletal:        General: Normal range of motion.  Lymphadenopathy:     Cervical: No cervical adenopathy.  Skin:    General: Skin is warm.     Findings: No rash.  Neurological:     Mental Status: She is alert and oriented to person, place, and time.  Psychiatric:        Mood and Affect: Mood normal.        Behavior: Behavior normal.           Assessment & Plan:

## 2022-05-05 ENCOUNTER — Encounter: Payer: Self-pay | Admitting: Nurse Practitioner

## 2022-05-08 ENCOUNTER — Other Ambulatory Visit: Payer: Self-pay | Admitting: Nurse Practitioner

## 2022-05-08 DIAGNOSIS — F411 Generalized anxiety disorder: Secondary | ICD-10-CM

## 2022-05-09 ENCOUNTER — Other Ambulatory Visit: Payer: Self-pay | Admitting: Nurse Practitioner

## 2022-05-09 DIAGNOSIS — F411 Generalized anxiety disorder: Secondary | ICD-10-CM

## 2022-05-11 ENCOUNTER — Other Ambulatory Visit: Payer: Self-pay | Admitting: Nurse Practitioner

## 2022-05-11 NOTE — Telephone Encounter (Signed)
Last office visit 11/01/21 Last refill 11/01/21, #180, 1 refill

## 2022-05-30 ENCOUNTER — Ambulatory Visit: Payer: Medicare HMO | Admitting: Nurse Practitioner

## 2022-06-01 ENCOUNTER — Telehealth: Payer: Self-pay | Admitting: Nurse Practitioner

## 2022-06-01 DIAGNOSIS — K219 Gastro-esophageal reflux disease without esophagitis: Secondary | ICD-10-CM

## 2022-06-01 MED ORDER — OMEPRAZOLE 40 MG PO CPDR: 40.0000 mg | DELAYED_RELEASE_CAPSULE | Freq: Two times a day (BID) | ORAL | 0 refills | Status: DC

## 2022-06-01 NOTE — Telephone Encounter (Signed)
  Prescription Request  06/01/2022  Is this a "Controlled Substance" medicine? No  Have you seen your PCP in the last 2 weeks? No  If YES, route message to pool  -  If NO, patient needs to be scheduled for appointment.  What is the name of the medication or equipment? Omeprazole 20 mg Take 2 pills a day instead of One pill a day. Works better for the heartburn  Have you contacted your pharmacy to request a refill? yes   Which pharmacy would you like this sent to? [facility] in [place]   Patient notified that their request is being sent to the clinical staff for review and that they should receive a response within 2 business days.

## 2022-06-01 NOTE — Telephone Encounter (Signed)
Pt aware refill sent to pharmacy 

## 2022-06-09 ENCOUNTER — Encounter: Payer: Self-pay | Admitting: Nurse Practitioner

## 2022-06-09 ENCOUNTER — Ambulatory Visit (INDEPENDENT_AMBULATORY_CARE_PROVIDER_SITE_OTHER): Payer: Medicare HMO | Admitting: Nurse Practitioner

## 2022-06-09 VITALS — BP 142/102 | HR 82 | Temp 97.8°F | Resp 20

## 2022-06-09 DIAGNOSIS — G114 Hereditary spastic paraplegia: Secondary | ICD-10-CM | POA: Diagnosis not present

## 2022-06-09 DIAGNOSIS — K581 Irritable bowel syndrome with constipation: Secondary | ICD-10-CM | POA: Diagnosis not present

## 2022-06-09 DIAGNOSIS — K219 Gastro-esophageal reflux disease without esophagitis: Secondary | ICD-10-CM | POA: Diagnosis not present

## 2022-06-09 DIAGNOSIS — K275 Chronic or unspecified peptic ulcer, site unspecified, with perforation: Secondary | ICD-10-CM

## 2022-06-09 DIAGNOSIS — F411 Generalized anxiety disorder: Secondary | ICD-10-CM | POA: Diagnosis not present

## 2022-06-09 DIAGNOSIS — R6889 Other general symptoms and signs: Secondary | ICD-10-CM | POA: Diagnosis not present

## 2022-06-09 DIAGNOSIS — Z136 Encounter for screening for cardiovascular disorders: Secondary | ICD-10-CM | POA: Diagnosis not present

## 2022-06-09 DIAGNOSIS — F17200 Nicotine dependence, unspecified, uncomplicated: Secondary | ICD-10-CM | POA: Diagnosis not present

## 2022-06-09 DIAGNOSIS — G43101 Migraine with aura, not intractable, with status migrainosus: Secondary | ICD-10-CM | POA: Diagnosis not present

## 2022-06-09 DIAGNOSIS — D509 Iron deficiency anemia, unspecified: Secondary | ICD-10-CM

## 2022-06-09 MED ORDER — TOPIRAMATE 50 MG PO TABS: 50.0000 mg | ORAL_TABLET | Freq: Two times a day (BID) | ORAL | 1 refills | Status: DC

## 2022-06-09 MED ORDER — OMEPRAZOLE 40 MG PO CPDR: 40.0000 mg | DELAYED_RELEASE_CAPSULE | Freq: Two times a day (BID) | ORAL | 0 refills | Status: DC

## 2022-06-09 MED ORDER — BACLOFEN 10 MG PO TABS: 10.0000 mg | ORAL_TABLET | Freq: Two times a day (BID) | ORAL | 1 refills | Status: DC

## 2022-06-09 NOTE — Progress Notes (Signed)
Subjective:    Patient ID: Tiffany Stewart, female    DOB: Sep 23, 1970, 51 y.o.   MRN: [id number]   Chief Complaint: medical management of chronic issues     HPI:  Tiffany Stewart is a 51 y.o. who identifies as a female who was assigned female at birth.   Social history: Lives with: lives with her daughter Work history: disability   Comes in today for follow up of the following chronic medical issues:  1. Irritable bowel syndrome with constipation Patient has diarrhea frequently.  2. Perforated peptic ulcer ([place]) Has not  had any recent flare up. Denies any abdominal pain  3. Gastroesophageal reflux disease without esophagitis Is on omeprazole daily and is doing well.  4. Migraine with aura and with status migrainosus, not intractable Has occasional migraines but aren't as bad as they use to be.  5. HSP (hereditary spastic paraplegia) ([place]) Is in wheel chair most of the time..  6. Iron deficiency anemia, unspecified iron deficiency anemia type Lab Results  Component Value Date   HGB 13.4 08/04/2021     7. Anxiety state Is on xanax BID.    06/09/2022    4:15 PM 11/01/2021    4:12 PM 08/04/2021    4:00 PM 03/03/2021   10:00 AM  GAD 7 : Generalized Anxiety Score  Nervous, Anxious, on Edge 1 1 0 0  Control/stop worrying 1 1 0 0  Worry too much - different things 1 1 0 0  Trouble relaxing 1 1 0 0  Restless 0 0 0 0  Easily annoyed or irritable 0 0 0 0  Afraid - awful might happen 1 1 0 0  Total GAD 7 Score 5 5 0 0  Anxiety Difficulty Not difficult at all Not difficult at all Not difficult at all Not difficult at all      8. TOBACCO ABUSE Still smokes about a pack a day. Refuses low dose CT scan   New complaints: None today  Allergies  Allergen Reactions   Hctz [Hydrochlorothiazide] Diarrhea   Morphine Other (See Comments)    Flushing, turns skin red    Zoloft [Sertraline Hcl] Other (See Comments)    Spaced out   Outpatient Encounter Medications as of  06/09/2022  Medication Sig   acetaminophen (TYLENOL) 500 MG tablet Take 1,500 mg by mouth 2 (two) times daily as needed for headache.    ALPRAZolam (XANAX) 0.5 MG tablet Take 1 tablet (0.5 mg total) by mouth 2 (two) times daily as needed. for anxiety   Aspirin-Acetaminophen-Caffeine (GOODY HEADACHE PO) Take 1 Package by mouth daily as needed.   baclofen (LIORESAL) 10 MG tablet TAKE 1 TABLET TWICE DAILY   diclofenac (VOLTAREN) 75 MG EC tablet TAKE 1 TABLET TWICE DAILY   diclofenac Sodium (VOLTAREN) 1 % GEL Apply 4 g topically 4 (four) times daily.   escitalopram (LEXAPRO) 10 MG tablet TAKE 1 TABLET EVERY DAY   omeprazole (PRILOSEC) 40 MG capsule Take 1 capsule (40 mg total) by mouth 2 (two) times daily.   topiramate (TOPAMAX) 50 MG tablet TAKE (1) TABLET TWICE DAILY.   No facility-administered encounter medications on file as of 06/09/2022.    Past Surgical History:  Procedure Laterality Date   CESAREAN SECTION     CHOLECYSTECTOMY     ENDOMETRIAL ABLATION  2000   GASTRORRHAPHY  03/17/2018   Procedure: GASTRORRHAPHY;  Surgeon: Aviva Signs, MD;  Location: [facility];  Service: General;;   HERNIA REPAIR  LAPAROTOMY N/A 03/17/2018   Procedure: EXPLORATORY LAPAROTOMY;  Surgeon: Aviva Signs, MD;  Location: [facility];  Service: General;  Laterality: N/A;    Family History  Problem Relation Age of Onset   Diabetes Father    Cancer Father        lung cancer   Heart disease Father    Heart disease Maternal Grandmother    Stroke Maternal Grandfather    Diabetes Paternal Grandmother    Colon cancer Neg Hx       Controlled substance contract: 11/09/21     Review of Systems  Constitutional:  Negative for diaphoresis.  Eyes:  Negative for pain.  Respiratory:  Negative for shortness of breath.   Cardiovascular:  Negative for chest pain, palpitations and leg swelling.  Gastrointestinal:  Negative for abdominal pain.  Endocrine: Negative for polydipsia.  Skin:  Negative for rash.   Neurological:  Negative for dizziness, weakness and headaches.  Hematological:  Does not bruise/bleed easily.  All other systems reviewed and are negative.      Objective:   Physical Exam Vitals and nursing note reviewed.  Constitutional:      General: She is not in acute distress.    Appearance: Normal appearance. She is well-developed.  HENT:     Head: Normocephalic.     Right Ear: Tympanic membrane normal.     Left Ear: Tympanic membrane normal.     Nose: Nose normal.     Mouth/Throat:     Mouth: Mucous membranes are moist.  Eyes:     Pupils: Pupils are equal, round, and reactive to light.  Neck:     Vascular: No carotid bruit or JVD.  Cardiovascular:     Rate and Rhythm: Normal rate and regular rhythm.     Heart sounds: Normal heart sounds.  Pulmonary:     Effort: Pulmonary effort is normal. No respiratory distress.     Breath sounds: Normal breath sounds. No wheezing or rales.  Chest:     Chest wall: No tenderness.  Abdominal:     General: Bowel sounds are normal. There is no distension or abdominal bruit.     Palpations: Abdomen is soft. There is no hepatomegaly, splenomegaly, mass or pulsatile mass.     Tenderness: There is no abdominal tenderness.  Musculoskeletal:        General: Normal range of motion.     Cervical back: Normal range of motion and neck supple.  Lymphadenopathy:     Cervical: No cervical adenopathy.  Skin:    General: Skin is warm and dry.  Neurological:     Mental Status: She is alert and oriented to person, place, and time.     Deep Tendon Reflexes: Reflexes are normal and symmetric.  Psychiatric:        Behavior: Behavior normal.        Thought Content: Thought content normal.        Judgment: Judgment normal.    BP (!) 142/102   Pulse 82   Temp 97.8 F (36.6 C) (Temporal)   Resp 20   SpO2 93%         Assessment & Plan:   Tiffany Stewart comes in today with chief complaint of No chief complaint on file.   Diagnosis and  orders addressed:  1. Irritable bowel syndrome with constipation Wathc diet - baclofen (LIORESAL) 10 MG tablet; Take 1 tablet (10 mg total) by mouth 2 (two) times daily.  Dispense: 180 tablet; Refill: 1  2.  Perforated peptic ulcer ([place])  3. Gastroesophageal reflux disease without esophagitis Avoid spicy foods Do not eat 2 hours prior to bedtime - omeprazole (PRILOSEC) 40 MG capsule; Take 1 capsule (40 mg total) by mouth 2 (two) times daily.  Dispense: 180 capsule; Refill: 0  4. Migraine with aura and with status migrainosus, not intractable - topiramate (TOPAMAX) 50 MG tablet; Take 1 tablet (50 mg total) by mouth 2 (two) times daily.  Dispense: 180 tablet; Refill: 1  5. HSP (hereditary spastic paraplegia) ([place])  6. Iron deficiency anemia, unspecified iron deficiency anemia type - CBC with Differential/Platelet - CMP14+EGFR - Lipid panel  7. Anxiety state  8. TOBACCO ABUSE   Labs pending Health Maintenance reviewed Diet and exercise encouraged  Follow up plan: 6 months   Mary-Margaret Hassell Done, FNP

## 2022-06-09 NOTE — Patient Instructions (Signed)

## 2022-06-10 LAB — CMP14+EGFR
ALT: 17 IU/L (ref 0–32)
AST: 18 IU/L (ref 0–40)
Albumin/Globulin Ratio: 1.8 (ref 1.2–2.2)
Albumin: 4.4 g/dL (ref 3.9–4.9)
Alkaline Phosphatase: 75 IU/L (ref 44–121)
BUN/Creatinine Ratio: 26 — ABNORMAL HIGH (ref 9–23)
BUN: 19 mg/dL (ref 6–24)
Bilirubin Total: 0.5 mg/dL (ref 0.0–1.2)
CO2: 19 mmol/L — ABNORMAL LOW (ref 20–29)
Calcium: 8.9 mg/dL (ref 8.7–10.2)
Chloride: 107 mmol/L — ABNORMAL HIGH (ref 96–106)
Creatinine, Ser: 0.73 mg/dL (ref 0.57–1.00)
Globulin, Total: 2.4 g/dL (ref 1.5–4.5)
Glucose: 92 mg/dL (ref 70–99)
Potassium: 3.8 mmol/L (ref 3.5–5.2)
Sodium: 141 mmol/L (ref 134–144)
Total Protein: 6.8 g/dL (ref 6.0–8.5)
eGFR: 101 mL/min/{1.73_m2} (ref >59)

## 2022-06-10 LAB — CBC WITH DIFFERENTIAL/PLATELET
Basophils Absolute: 0 10*3/uL (ref 0.0–0.2)
Basos: 1 %
EOS (ABSOLUTE): 0.1 10*3/uL (ref 0.0–0.4)
Eos: 2 %
Hematocrit: 39 % (ref 34.0–46.6)
Hemoglobin: 13.3 g/dL (ref 11.1–15.9)
Immature Grans (Abs): 0 10*3/uL (ref 0.0–0.1)
Immature Granulocytes: 0 %
Lymphocytes Absolute: 1.3 10*3/uL (ref 0.7–3.1)
Lymphs: 27 %
MCH: 32.6 pg (ref 26.6–33.0)
MCHC: 34.1 g/dL (ref 31.5–35.7)
MCV: 96 fL (ref 79–97)
Monocytes Absolute: 0.3 10*3/uL (ref 0.1–0.9)
Monocytes: 6 %
Neutrophils Absolute: 3.1 10*3/uL (ref 1.4–7.0)
Neutrophils: 64 %
Platelets: 260 10*3/uL (ref 150–450)
RBC: 4.08 x10E6/uL (ref 3.77–5.28)
RDW: 13.1 % (ref 11.7–15.4)
WBC: 4.9 10*3/uL (ref 3.4–10.8)

## 2022-06-10 LAB — LIPID PANEL
Chol/HDL Ratio: 4.5 ratio — ABNORMAL HIGH (ref 0.0–4.4)
Cholesterol, Total: 175 mg/dL (ref 100–199)
HDL: 39 mg/dL — ABNORMAL LOW (ref >39)
LDL Chol Calc (NIH): 113 mg/dL — ABNORMAL HIGH (ref 0–99)
Triglycerides: 126 mg/dL (ref 0–149)
VLDL Cholesterol Cal: 23 mg/dL (ref 5–40)

## 2022-06-12 ENCOUNTER — Telehealth: Payer: Self-pay | Admitting: Nurse Practitioner

## 2022-06-12 ENCOUNTER — Other Ambulatory Visit: Payer: Self-pay | Admitting: Nurse Practitioner

## 2022-06-12 DIAGNOSIS — F411 Generalized anxiety disorder: Secondary | ICD-10-CM

## 2022-06-12 NOTE — Telephone Encounter (Signed)
Patient called she is aware she will need a face to face 30 min appt with mmm. Patient states she will call back to schedule once she gets with her daughter. Please schedule her for 30 min when she calls back.

## 2022-06-21 ENCOUNTER — Encounter: Payer: Self-pay | Admitting: *Deleted

## 2022-06-23 ENCOUNTER — Ambulatory Visit: Payer: Medicare HMO | Admitting: Nurse Practitioner

## 2022-06-26 ENCOUNTER — Ambulatory Visit: Payer: Medicare HMO | Admitting: Nurse Practitioner

## 2022-06-26 ENCOUNTER — Encounter: Payer: Self-pay | Admitting: Nurse Practitioner

## 2022-07-11 ENCOUNTER — Ambulatory Visit: Payer: Medicare HMO | Admitting: Nurse Practitioner

## 2022-07-14 ENCOUNTER — Ambulatory Visit: Payer: Medicare HMO | Admitting: Nurse Practitioner

## 2022-07-18 ENCOUNTER — Encounter: Payer: Self-pay | Admitting: Nurse Practitioner

## 2022-07-18 ENCOUNTER — Ambulatory Visit (INDEPENDENT_AMBULATORY_CARE_PROVIDER_SITE_OTHER): Payer: Medicare HMO | Admitting: Nurse Practitioner

## 2022-07-18 VITALS — BP 138/78 | HR 77 | Temp 97.5°F | Resp 20

## 2022-07-18 DIAGNOSIS — G114 Hereditary spastic paraplegia: Secondary | ICD-10-CM

## 2022-07-18 NOTE — Progress Notes (Signed)
   Subjective:    Patient ID: Tiffany Stewart, female    DOB: 1971/02/13, 51 y.o.   MRN: [id number]   Chief Complaint: Wants Wheelchair   HPI Patient has hereditary spastic paraplegic and spend a lot of time in her wheel chair. Her wheel chair is worn out and she needs replacement. Her current wheelchair she bought herself at flea market. She spend about over half of her days in her wheel chair.    Review of Systems  Constitutional:  Negative for diaphoresis.  Eyes:  Negative for pain.  Respiratory:  Negative for shortness of breath.   Cardiovascular:  Negative for chest pain, palpitations and leg swelling.  Gastrointestinal:  Negative for abdominal pain.  Endocrine: Negative for polydipsia.  Skin:  Negative for rash.  Neurological:  Negative for dizziness, weakness and headaches.  Hematological:  Does not bruise/bleed easily.  All other systems reviewed and are negative.      Objective:   Physical Exam Constitutional:      Appearance: Normal appearance.  Cardiovascular:     Rate and Rhythm: Normal rate and regular rhythm.     Heart sounds: Normal heart sounds.  Pulmonary:     Effort: Pulmonary effort is normal.     Breath sounds: Normal breath sounds.  Musculoskeletal:     Comments: Bil feet are inverted Patient sitting wheel chair  Skin:    General: Skin is warm.  Neurological:     General: No focal deficit present.     Mental Status: She is alert and oriented to person, place, and time.  Psychiatric:        Mood and Affect: Mood normal.        Behavior: Behavior normal.    BP 138/78   Pulse 77   Temp (!) 97.5 F (36.4 C) (Temporal)   Resp 20          Assessment & Plan:  [name] in today with chief complaint of Wants Wheelchair   1. HSP (hereditary spastic paraplegia) ([place]) New wheel chair ordere - For home use only DME standard manual wheelchair with seat cushion    The above assessment and management plan was discussed with the patient. The  patient verbalized understanding of and has agreed to the management plan. Patient is aware to call the clinic if symptoms persist or worsen. Patient is aware when to return to the clinic for a follow-up visit. Patient educated on when it is appropriate to go to the emergency department.   Mary-Margaret Hassell Done, FNP

## 2022-07-18 NOTE — Patient Instructions (Signed)

## 2022-08-09 ENCOUNTER — Other Ambulatory Visit: Payer: Self-pay | Admitting: Nurse Practitioner

## 2022-08-09 DIAGNOSIS — F411 Generalized anxiety disorder: Secondary | ICD-10-CM

## 2022-08-09 DIAGNOSIS — G43101 Migraine with aura, not intractable, with status migrainosus: Secondary | ICD-10-CM

## 2022-08-20 ENCOUNTER — Other Ambulatory Visit: Payer: Self-pay | Admitting: Nurse Practitioner

## 2022-08-20 DIAGNOSIS — G114 Hereditary spastic paraplegia: Secondary | ICD-10-CM

## 2022-10-09 ENCOUNTER — Other Ambulatory Visit: Payer: Self-pay | Admitting: Nurse Practitioner

## 2022-10-09 DIAGNOSIS — K219 Gastro-esophageal reflux disease without esophagitis: Secondary | ICD-10-CM

## 2022-11-02 ENCOUNTER — Other Ambulatory Visit: Payer: Self-pay | Admitting: Nurse Practitioner

## 2022-11-02 DIAGNOSIS — G114 Hereditary spastic paraplegia: Secondary | ICD-10-CM

## 2022-11-07 ENCOUNTER — Other Ambulatory Visit: Payer: Self-pay | Admitting: Nurse Practitioner

## 2022-11-07 DIAGNOSIS — K219 Gastro-esophageal reflux disease without esophagitis: Secondary | ICD-10-CM

## 2022-11-15 ENCOUNTER — Telehealth: Payer: Self-pay | Admitting: Nurse Practitioner

## 2022-11-15 NOTE — Telephone Encounter (Signed)
Contacted Tiffany Stewart to schedule their annual wellness visit. Appointment made for 11/16/2022.  Thank you,  Colletta Maryland,  [place] [facility] Direct Dial ??[phone]

## 2022-11-22 ENCOUNTER — Telehealth: Payer: Self-pay | Admitting: Nurse Practitioner

## 2022-11-22 NOTE — Telephone Encounter (Signed)
Contacted Darnelle Spangle to schedule their annual wellness visit. Appointment made for 11/29/2022.  Thank you,  Judeth Cornfield,  [facility] Clinical Support [facility] Direct Dial ??[phone]

## 2022-11-29 ENCOUNTER — Ambulatory Visit (INDEPENDENT_AMBULATORY_CARE_PROVIDER_SITE_OTHER): Payer: Medicare HMO

## 2022-11-29 VITALS — Ht 60.0 in | Wt 117.0 lb

## 2022-11-29 DIAGNOSIS — Z0000 Encounter for general adult medical examination without abnormal findings: Secondary | ICD-10-CM | POA: Diagnosis not present

## 2022-11-29 NOTE — Progress Notes (Signed)
Subjective:   Tiffany Stewart is a 52 y.o. female who presents for Medicare Annual (Subsequent) preventive examination.  I connected with  Darnelle Spangle on 11/29/22 by a audio enabled telemedicine application and verified that I am speaking with the correct person using two identifiers.  Patient Location: Home  Provider Location: Home Office  I discussed the limitations of evaluation and management by telemedicine. The patient expressed understanding and agreed to proceed.  Review of Systems     Cardiac Risk Factors include: smoking/ tobacco exposure;sedentary lifestyle     Objective:    Today's Vitals   11/29/22 1444  Weight: 117 lb (53.1 kg)  Height: 5' (1.524 m)   Body mass index is 22.85 kg/m.     10/19/2021    8:39 AM 10/18/2020    8:27 AM 08/29/2020    9:49 AM 08/28/2020   11:49 PM 10/13/2019    8:40 AM 04/05/2018   11:00 PM 04/05/2018    3:45 PM  Advanced Directives  Does Patient Have a Medical Advance Directive? Yes No No Unable to assess, patient is non-responsive or altered mental status No No No  Type of Advance Directive Healthcare Power of [employer] of Healthcare Power of Attorney in Chart? No - copy requested        Would patient like information on creating a medical advance directive?  No - Guardian declined No - Patient declined  No - Patient declined No - Patient declined No - Patient declined    Current Medications (verified) Outpatient Encounter Medications as of 11/29/2022  Medication Sig   acetaminophen (TYLENOL) 500 MG tablet Take 1,500 mg by mouth 2 (two) times daily as needed for headache.    ALPRAZolam (XANAX) 0.5 MG tablet TAKE 1 TABLET BY MOUTH TWICE DAILY AS NEEDED FOR ANXIETY.   Aspirin-Acetaminophen-Caffeine (GOODY HEADACHE PO) Take 1 Package by mouth daily as needed.   baclofen (LIORESAL) 10 MG tablet Take 1 tablet (10 mg total) by mouth 2 (two) times daily.   diclofenac (VOLTAREN) 75 MG EC tablet TAKE 1 TABLET TWICE DAILY   diclofenac  Sodium (VOLTAREN) 1 % GEL Apply 4 g topically 4 (four) times daily.   escitalopram (LEXAPRO) 10 MG tablet TAKE 1 TABLET EVERY DAY   omeprazole (PRILOSEC) 40 MG capsule TAKE 1 CAPSULE BY MOUTH TWICE DAILY.   topiramate (TOPAMAX) 50 MG tablet Take 1 tablet (50 mg total) by mouth 2 (two) times daily.   No facility-administered encounter medications on file as of 11/29/2022.    Allergies (verified) Hctz [hydrochlorothiazide], Morphine, and Zoloft [sertraline hcl]   History: Past Medical History:  Diagnosis Date   Anxiety    Chronic pain    Depression    GERD (gastroesophageal reflux disease)    HSP (hereditary spastic paraplegia)    Migraines    Perforated peptic ulcer 03/17/2018   Goody powder overuse   Past Surgical History:  Procedure Laterality Date   CESAREAN SECTION     CHOLECYSTECTOMY     ENDOMETRIAL ABLATION  2000   GASTRORRHAPHY  03/17/2018   Procedure: GASTRORRHAPHY;  Surgeon: Franky Macho, MD;  Location: [facility];  Service: General;;   HERNIA REPAIR     LAPAROTOMY N/A 03/17/2018   Procedure: EXPLORATORY LAPAROTOMY;  Surgeon: Franky Macho, MD;  Location: [facility];  Service: General;  Laterality: N/A;   Family History  Problem Relation Age of Onset   Diabetes Father    Cancer Father  lung cancer   Heart disease Father    Heart disease Maternal Grandmother    Stroke Maternal Grandfather    Diabetes Paternal Grandmother    Colon cancer Neg Hx    Social History   Socioeconomic History   Marital status: Single    Spouse name: Not on file   Number of children: 3   Years of education: 10   Highest education level: 10th grade  Occupational History   Occupation: disabled  Tobacco Use   Smoking status: Former    Packs/day: 0.25    Years: 27.00    Additional pack years: 0.00    Total pack years: 6.75    Types: Cigarettes    Quit date: 01/2020    Years since quitting: 2.8   Smokeless tobacco: Never  Vaping Use   Vaping Use: Every day   Start date: 01/13/2020   Substance and Sexual Activity   Alcohol use: No   Drug use: No   Sexual activity: Not Currently    Birth control/protection: None  Other Topics Concern   Not on file  Social History Narrative   Lives with daughter and son in law   Social Determinants of Health   Financial Resource Strain: Low Risk  (10/19/2021)   Overall Financial Resource Strain (CARDIA)    Difficulty of Paying Living Expenses: Not very hard  Food Insecurity: No Food Insecurity (10/19/2021)   Hunger Vital Sign    Worried About Running Out of Food in the Last Year: Never true    Ran Out of Food in the Last Year: Never true  Transportation Needs: Unmet Transportation Needs (10/19/2021)   PRAPARE - Administrator, Civil Service (Medical): Yes    Lack of Transportation (Non-Medical): No  Physical Activity: Inactive (10/19/2021)   Exercise Vital Sign    Days of Exercise per Week: 0 days    Minutes of Exercise per Session: 0 min  Stress: Stress Concern Present (10/19/2021)   [employer] of Occupational Health - Occupational Stress Questionnaire    Feeling of Stress : To some extent  Social Connections: Socially Isolated (10/19/2021)   Social Connection and Isolation Panel [NHANES]    Frequency of Communication with Friends and Family: More than three times a week    Frequency of Social Gatherings with Friends and Family: More than three times a week    Attends Religious Services: Never    Database administrator or Organizations: No    Attends Engineer, structural: Never    Marital Status: Never married    Tobacco Counseling Counseling given: Not Answered   Clinical Intake:  Pre-visit preparation completed: Yes  Pain : No/denies pain  Diabetes: No  How often do you need to have someone help you when you read instructions, pamphlets, or other written materials from your doctor or pharmacy?: 4 - Often  Diabetic?No   Interpreter Needed?: No  Information entered by :: Kandis Fantasia  LPN   Activities of Daily Living    11/29/2022    3:00 PM  In your present state of health, do you have any difficulty performing the following activities:  Hearing? 0  Vision? 0  Difficulty concentrating or making decisions? 0  Walking or climbing stairs? 1  Dressing or bathing? 0  Doing errands, shopping? 1  Preparing Food and eating ? N  Using the Toilet? N  In the past six months, have you accidently leaked urine? N  Do you have problems with loss of  bowel control? N  Managing your Medications? N  Managing your Finances? N  Housekeeping or managing your Housekeeping? Y    Patient Care Team: Bennie Pierini, FNP as PCP - General (Nurse Practitioner) [place] Bali, MD (Inactive) as Consulting Physician (Gastroenterology)  Indicate any recent Medical Services you may have received from other than [facility] providers in the past year (date may be approximate).     Assessment:   This is a routine wellness examination for Tiffany Stewart.  Hearing/Vision screen Hearing Screening - Comments:: Denies hearing difficulties   Vision Screening - Comments:: Wears rx glasses - up to date with routine eye exams with [facility]    Dietary issues and exercise activities discussed: Current Exercise Habits: The patient does not participate in regular exercise at present, Exercise limited by: neurologic condition(s)   Goals Addressed             This Visit's Progress    COMPLETED: Patient Stated       10/18/2020 AWV Goal: Improved Nutrition/Diet  Patient will verbalize understanding that diet plays an important role in overall health and that a poor diet is a risk factor for many chronic medical conditions.  Over the next year, patient will improve self management of their diet by incorporating fewer sweetened foods & beverages and better food choices. Patient will utilize available community resources to help with food acquisition if needed (ex: food pantries, [address],  etc) Patient will work with nutrition specialist if a referral was made      Prevent falls        Depression Screen    06/09/2022    4:14 PM 11/01/2021    4:12 PM 10/19/2021    8:36 AM 08/04/2021    3:59 PM 03/03/2021   10:00 AM 11/11/2020    4:30 PM 10/22/2020   11:32 AM  PHQ 2/9 Scores  PHQ - 2 Score 0 0 1 0 0 0 0  PHQ- 9 Score 3 3  0 0      Fall Risk    11/29/2022    3:00 PM 06/09/2022    4:14 PM 11/01/2021    4:12 PM 10/19/2021    8:32 AM 08/04/2021    3:59 PM  Fall Risk   Falls in the past year? 0 0 1 1 0  Number falls in past yr: 0  0 0   Injury with Fall? 0  0 0   Risk for fall due to : No Fall Risks  History of fall(s) History of fall(s);Impaired mobility;Orthopedic patient;Other (Comment)   Risk for fall due to: Comment    spastic hemiplegia   Follow up Falls prevention discussed;Education provided;Falls evaluation completed  Education provided Education provided;Falls prevention discussed     FALL RISK PREVENTION PERTAINING TO THE HOME:  Any stairs in or around the home? No  If so, are there any without handrails? No  Home free of loose throw rugs in walkways, pet beds, electrical cords, etc? Yes  Adequate lighting in your home to reduce risk of falls? Yes   ASSISTIVE DEVICES UTILIZED TO PREVENT FALLS:  Life alert? No  Use of a cane, walker or w/c? Yes  Grab bars in the bathroom? Yes  Shower chair or bench in shower? Yes  Elevated toilet seat or a handicapped toilet? Yes   TIMED UP AND GO:  Was the test performed? No . Telephonic visit   Cognitive Function:        11/29/2022  3:01 PM 10/18/2020    8:29 AM 10/13/2019    8:46 AM  6CIT Screen  What Year? 0 points 0 points 0 points  What month? 0 points 0 points 0 points  What time? 0 points 0 points 0 points  Count back from 20 0 points 0 points 0 points  Months in reverse 0 points 0 points 0 points  Repeat phrase 0 points 0 points 0 points  Total Score 0 points 0 points 0 points     Immunizations  There is no immunization history on file for this patient.  TDAP status: Due, Education has been provided regarding the importance of this vaccine. Advised may receive this vaccine at local pharmacy or Health Dept. Aware to provide a copy of the vaccination record if obtained from local pharmacy or Health Dept. Verbalized acceptance and understanding.  Flu Vaccine status: Up to date  Covid-19 vaccine status: Information provided on how to obtain vaccines.   Qualifies for Shingles Vaccine? No    Screening Tests Health Maintenance  Topic Date Due   COVID-19 Vaccine (1) Never done   Hepatitis C Screening  Never done   DTaP/Tdap/Td (1 - Tdap) Never done   PAP SMEAR-Modifier  Never done   COLONOSCOPY (Pts 45-46yrs Insurance coverage will need to be confirmed)  11/29/2023 (Originally 11/30/2017)   INFLUENZA VACCINE  03/15/2023   Medicare Annual Wellness (AWV)  11/29/2023   HIV Screening  Completed   HPV VACCINES  Aged Out    Health Maintenance  Health Maintenance Due  Topic Date Due   COVID-19 Vaccine (1) Never done   Hepatitis C Screening  Never done   DTaP/Tdap/Td (1 - Tdap) Never done   PAP SMEAR-Modifier  Never done    Colorectal cancer screening: Patient declines at this time   Lung Cancer Screening: (Low Dose CT Chest recommended if Age 38-80 years, 30 pack-year currently smoking OR have quit w/in 15years.) does not qualify.   Lung Cancer Screening Referral: n/a  Additional Screening:  Hepatitis C Screening: does qualify  Vision Screening: Recommended annual ophthalmology exams for early detection of glaucoma and other disorders of the eye. Is the patient up to date with their annual eye exam?  Yes  Who is the provider or what is the name of the office in which the patient attends annual eye exams? [facility] If pt is not established with a provider, would they like to be referred to a provider to establish care? No .   Dental  Screening: Recommended annual dental exams for proper oral hygiene  Community Resource Referral / Chronic Care Management: CRR required this visit?  No   CCM required this visit?  No      Plan:     I have personally reviewed and noted the following in the patient's chart:   Medical and social history Use of alcohol, tobacco or illicit drugs  Current medications and supplements including opioid prescriptions. Patient is not currently taking opioid prescriptions. Functional ability and status Nutritional status Physical activity Advanced directives List of other physicians Hospitalizations, surgeries, and ER visits in previous 12 months Vitals Screenings to include cognitive, depression, and falls Referrals and appointments  In addition, I have reviewed and discussed with patient certain preventive protocols, quality metrics, and best practice recommendations. A written personalized care plan for preventive services as well as general preventive health recommendations were provided to patient.     Kandis Fantasia [employer], [place]   11/20/8117   Due to this  being a virtual visit, the after visit summary with patients personalized plan was offered to patient via mail or my-chart.  per request, patient was mailed a copy of AVS  Nurse Notes: Patient would like to discuss referral to orthopedics at upcoming appointment

## 2022-11-29 NOTE — Patient Instructions (Signed)
Tiffany Stewart , Thank you for taking time to come for your Medicare Wellness Visit. I appreciate your ongoing commitment to your health goals. Please review the following plan we discussed and let me know if I can assist you in the future.   These are the goals we discussed:  Goals      DIET - INCREASE WATER INTAKE     Try to drink 6-8 glasses of water daily     Prevent falls        This is a list of the screening recommended for you and due dates:  Health Maintenance  Topic Date Due   COVID-19 Vaccine (1) Never done   Hepatitis C Screening: USPSTF Recommendation to screen - Ages 62-79 yo.  Never done   DTaP/Tdap/Td vaccine (1 - Tdap) Never done   Pap Smear  Never done   Colon Cancer Screening  11/29/2023*   Flu Shot  03/15/2023   Medicare Annual Wellness Visit  11/29/2023   HIV Screening  Completed   HPV Vaccine  Aged Out  *Topic was postponed. The date shown is not the original due date.    Advanced directives: Forms are available if you choose in the future to pursue completion.  This is recommended in order to make sure that your health wishes are honored in the event that you are unable to verbalize them to the provider.    Conditions/risks identified: Aim for 30 minutes of exercise or brisk walking, 6-8 glasses of water, and 5 servings of fruits and vegetables each day.   Next appointment: Follow up in one year for your annual wellness visit.   Preventive Care 40-64 Years, Female Preventive care refers to lifestyle choices and visits with your health care provider that can promote health and wellness. What does preventive care include? A yearly physical exam. This is also called an annual well check. Dental exams once or twice a year. Routine eye exams. Ask your health care provider how often you should have your eyes checked. Personal lifestyle choices, including: Daily care of your teeth and gums. Regular physical activity. Eating a healthy diet. Avoiding tobacco and  drug use. Limiting alcohol use. Practicing safe sex. Taking low-dose aspirin daily starting at age 44. Taking vitamin and mineral supplements as recommended by your health care provider. What happens during an annual well check? The services and screenings done by your health care provider during your annual well check will depend on your age, overall health, lifestyle risk factors, and family history of disease. Counseling  Your health care provider may ask you questions about your: Alcohol use. Tobacco use. Drug use. Emotional well-being. Home and relationship well-being. Sexual activity. Eating habits. Work and work Astronomer. Method of birth control. Menstrual cycle. Pregnancy history. Screening  You may have the following tests or measurements: Height, weight, and BMI. Blood pressure. Lipid and cholesterol levels. These may be checked every 5 years, or more frequently if you are over 82 years old. Skin check. Lung cancer screening. You may have this screening every year starting at age 64 if you have a 30-pack-year history of smoking and currently smoke or have quit within the past 15 years. Fecal occult blood test (FOBT) of the stool. You may have this test every year starting at age 10. Flexible sigmoidoscopy or colonoscopy. You may have a sigmoidoscopy every 5 years or a colonoscopy every 10 years starting at age 35. Hepatitis C blood test. Hepatitis B blood test. Sexually transmitted disease (STD) testing. Diabetes  screening. This is done by checking your blood sugar (glucose) after you have not eaten for a while (fasting). You may have this done every 1-3 years. Mammogram. This may be done every 1-2 years. Talk to your health care provider about when you should start having regular mammograms. This may depend on whether you have a family history of breast cancer. BRCA-related cancer screening. This may be done if you have a family history of breast, ovarian, tubal, or  peritoneal cancers. Pelvic exam and Pap test. This may be done every 3 years starting at age 48. Starting at age 40, this may be done every 5 years if you have a Pap test in combination with an HPV test. Bone density scan. This is done to screen for osteoporosis. You may have this scan if you are at high risk for osteoporosis. Discuss your test results, treatment options, and if necessary, the need for more tests with your health care provider. Vaccines  Your health care provider may recommend certain vaccines, such as: Influenza vaccine. This is recommended every year. Tetanus, diphtheria, and acellular pertussis (Tdap, Td) vaccine. You may need a Td booster every 10 years. Zoster vaccine. You may need this after age 110. Pneumococcal 13-valent conjugate (PCV13) vaccine. You may need this if you have certain conditions and were not previously vaccinated. Pneumococcal polysaccharide (PPSV23) vaccine. You may need one or two doses if you smoke cigarettes or if you have certain conditions. Talk to your health care provider about which screenings and vaccines you need and how often you need them. This information is not intended to replace advice given to you by your health care provider. Make sure you discuss any questions you have with your health care provider. Document Released: 08/27/2015 Document Revised: 04/19/2016 Document Reviewed: 06/01/2015 Elsevier Interactive Patient Education  2017 [place] Prevention in the Home Falls can cause injuries. They can happen to people of all ages. There are many things you can do to make your home safe and to help prevent falls. What can I do on the outside of my home? Regularly fix the edges of walkways and driveways and fix any cracks. Remove anything that might make you trip as you walk through a door, such as a raised step or threshold. Trim any bushes or trees on the path to your home. Use bright outdoor lighting. Clear any walking  paths of anything that might make someone trip, such as rocks or tools. Regularly check to see if handrails are loose or broken. Make sure that both sides of any steps have handrails. Any raised decks and porches should have guardrails on the edges. Have any leaves, snow, or ice cleared regularly. Use sand or salt on walking paths during winter. Clean up any spills in your garage right away. This includes oil or grease spills. What can I do in the bathroom? Use night lights. Install grab bars by the toilet and in the tub and shower. Do not use towel bars as grab bars. Use non-skid mats or decals in the tub or shower. If you need to sit down in the shower, use a plastic, non-slip stool. Keep the floor dry. Clean up any water that spills on the floor as soon as it happens. Remove soap buildup in the tub or shower regularly. Attach bath mats securely with double-sided non-slip rug tape. Do not have throw rugs and other things on the floor that can make you trip. What can I do in the  bedroom? Use night lights. Make sure that you have a light by your bed that is easy to reach. Do not use any sheets or blankets that are too big for your bed. They should not hang down onto the floor. Have a firm chair that has side arms. You can use this for support while you get dressed. Do not have throw rugs and other things on the floor that can make you trip. What can I do in the kitchen? Clean up any spills right away. Avoid walking on wet floors. Keep items that you use a lot in easy-to-reach places. If you need to reach something above you, use a strong step stool that has a grab bar. Keep electrical cords out of the way. Do not use floor polish or wax that makes floors slippery. If you must use wax, use non-skid floor wax. Do not have throw rugs and other things on the floor that can make you trip. What can I do with my stairs? Do not leave any items on the stairs. Make sure that there are handrails  on both sides of the stairs and use them. Fix handrails that are broken or loose. Make sure that handrails are as long as the stairways. Check any carpeting to make sure that it is firmly attached to the stairs. Fix any carpet that is loose or worn. Avoid having throw rugs at the top or bottom of the stairs. If you do have throw rugs, attach them to the floor with carpet tape. Make sure that you have a light switch at the top of the stairs and the bottom of the stairs. If you do not have them, ask someone to add them for you. What else can I do to help prevent falls? Wear shoes that: Do not have high heels. Have rubber bottoms. Are comfortable and fit you well. Are closed at the toe. Do not wear sandals. If you use a stepladder: Make sure that it is fully opened. Do not climb a closed stepladder. Make sure that both sides of the stepladder are locked into place. Ask someone to hold it for you, if possible. Clearly mark and make sure that you can see: Any grab bars or handrails. First and last steps. Where the edge of each step is. Use tools that help you move around (mobility aids) if they are needed. These include: Canes. Walkers. Scooters. Crutches. Turn on the lights when you go into a dark area. Replace any light bulbs as soon as they burn out. Set up your furniture so you have a clear path. Avoid moving your furniture around. If any of your floors are uneven, fix them. If there are any pets around you, be aware of where they are. Review your medicines with your doctor. Some medicines can make you feel dizzy. This can increase your chance of falling. Ask your doctor what other things that you can do to help prevent falls. This information is not intended to replace advice given to you by your health care provider. Make sure you discuss any questions you have with your health care provider. Document Released: 05/27/2009 Document Revised: 01/06/2016 Document Reviewed:  09/04/2014 Elsevier Interactive Patient Education  2017 [employer].

## 2022-12-02 ENCOUNTER — Other Ambulatory Visit: Payer: Self-pay | Admitting: Nurse Practitioner

## 2022-12-02 DIAGNOSIS — F411 Generalized anxiety disorder: Secondary | ICD-10-CM

## 2022-12-08 ENCOUNTER — Ambulatory Visit: Payer: Medicare HMO | Admitting: Nurse Practitioner

## 2022-12-08 NOTE — Patient Instructions (Signed)
Our records indicate that you are due for your annual mammogram/breast imaging. While there is no way to prevent breast cancer, early detection provides the best opportunity for curing it. For women over the age of 40, the American Cancer Society recommends a yearly clinical breast exam and a yearly mammogram. These practices have saved thousands of lives. We need your help to ensure that you are receiving optimal medical care. Please call the imaging location that has done you previous mammograms. Please remember to list us as your primary care. This helps make sure we receive a report and can update your chart.  Below is the contact information for several local breast imaging centers. You may call the location that works best for you, and they will be happy to assistance in making you an appointment. You do not need an order for a regular screening mammogram. However, if you are having any problems or concerns with you breast area, please let your primary care provider know, and appropriate orders will be placed. Please let our office know if you have any questions or concerns. Or if you need information for another imaging center not on this list or outside of the area. We are commented to working with you on your health care journey.   The mobile unit/bus ([facility]) - they come twice a month to our location.  These appointments can be made through our office or by call The Breast Center  [facility]  [address] [place], [place] [address] Phone [phone]  [facility] Radiology Department  [address]  [place], [place] [address] [phone]  [facility])  [address] [place], [place] [address] [phone]  [facility] [place]  [address] [place] [address] [phone]  [facility]  [address], [address] [place] [address] [phone]  [facility] [employer]  [address] [place], [place] [address] [phone]  [facility] [address] [place], [place] [address] [phone]  [facility] [address]  [address] [place], [place] [address] [phone]  [facility] [address] [place] [address] [phone]     

## 2022-12-09 ENCOUNTER — Other Ambulatory Visit: Payer: Self-pay | Admitting: Nurse Practitioner

## 2022-12-09 DIAGNOSIS — F411 Generalized anxiety disorder: Secondary | ICD-10-CM

## 2022-12-11 ENCOUNTER — Ambulatory Visit (INDEPENDENT_AMBULATORY_CARE_PROVIDER_SITE_OTHER): Payer: Medicare HMO | Admitting: Nurse Practitioner

## 2022-12-11 ENCOUNTER — Encounter: Payer: Self-pay | Admitting: Nurse Practitioner

## 2022-12-11 VITALS — BP 118/82 | HR 78 | Temp 98.2°F | Resp 20

## 2022-12-11 DIAGNOSIS — K581 Irritable bowel syndrome with constipation: Secondary | ICD-10-CM | POA: Diagnosis not present

## 2022-12-11 DIAGNOSIS — K275 Chronic or unspecified peptic ulcer, site unspecified, with perforation: Secondary | ICD-10-CM | POA: Diagnosis not present

## 2022-12-11 DIAGNOSIS — D509 Iron deficiency anemia, unspecified: Secondary | ICD-10-CM

## 2022-12-11 DIAGNOSIS — K219 Gastro-esophageal reflux disease without esophagitis: Secondary | ICD-10-CM | POA: Diagnosis not present

## 2022-12-11 DIAGNOSIS — G43101 Migraine with aura, not intractable, with status migrainosus: Secondary | ICD-10-CM | POA: Diagnosis not present

## 2022-12-11 DIAGNOSIS — G114 Hereditary spastic paraplegia: Secondary | ICD-10-CM

## 2022-12-11 DIAGNOSIS — F17200 Nicotine dependence, unspecified, uncomplicated: Secondary | ICD-10-CM | POA: Diagnosis not present

## 2022-12-11 DIAGNOSIS — F411 Generalized anxiety disorder: Secondary | ICD-10-CM

## 2022-12-11 MED ORDER — ALPRAZOLAM 0.5 MG PO TABS: 0.5000 mg | ORAL_TABLET | Freq: Two times a day (BID) | ORAL | 5 refills | Status: DC | PRN

## 2022-12-11 MED ORDER — ESCITALOPRAM OXALATE 10 MG PO TABS: 10.0000 mg | ORAL_TABLET | Freq: Every day | ORAL | 1 refills | Status: DC

## 2022-12-11 MED ORDER — TOPIRAMATE 50 MG PO TABS
50.0000 mg | ORAL_TABLET | Freq: Two times a day (BID) | ORAL | 1 refills | Status: DC
Start: 2022-12-11 — End: 2023-01-10

## 2022-12-11 MED ORDER — OMEPRAZOLE 40 MG PO CPDR: 40.0000 mg | DELAYED_RELEASE_CAPSULE | Freq: Two times a day (BID) | ORAL | 1 refills | Status: DC

## 2022-12-11 MED ORDER — BACLOFEN 10 MG PO TABS: 10.0000 mg | ORAL_TABLET | Freq: Two times a day (BID) | ORAL | 1 refills | Status: DC

## 2022-12-11 NOTE — Progress Notes (Signed)
Subjective:    Patient ID: Tiffany Stewart, female    DOB: Nov 21, 1970, 52 y.o.   MRN: [id number]   Chief Complaint: medical management of chronic issues     HPI:  Tiffany Stewart is a 52 y.o. who identifies as a female who was assigned female at birth.   Social history: Lives with: daughter Work history: disability   Comes in today for follow up of the following chronic medical issues:  1. Gastroesophageal reflux disease without esophagitis Is on omeprazole daily and is doing well.  2. Irritable bowel syndrome with constipation Alternates from constipation to diarrhea. She knows what to do for it.   3. Perforated peptic ulcer (HCC) This was several years ago when she was taking a lot of ibuprofen  4. HSP (hereditary spastic paraplegia) (HCC) Is in a wheel chair all the time  5. Anxiety state Is on xanx BID    12/11/2022    3:33 PM 06/09/2022    4:15 PM 11/01/2021    4:12 PM 08/04/2021    4:00 PM  GAD 7 : Generalized Anxiety Score  Nervous, Anxious, on Edge 1 1 1  0  Control/stop worrying 1 1 1  0  Worry too much - different things 1 1 1  0  Trouble relaxing 1 1 1  0  Restless 0 0 0 0  Easily annoyed or irritable 1 0 0 0  Afraid - awful might happen 1 1 1  0  Total GAD 7 Score 6 5 5  0  Anxiety Difficulty Not difficult at all Not difficult at all Not difficult at all Not difficult at all        6. Iron deficiency anemia, unspecified iron deficiency anemia type No c/o fatigue Lab Results  Component Value Date   HGB 13.3 06/09/2022     7. TOBACCO ABUSE Smokes over a pack a day   New complaints: None today  Allergies  Allergen Reactions   Hctz [Hydrochlorothiazide] Diarrhea   Morphine Other (See Comments)    Flushing, turns skin red    Zoloft [Sertraline Hcl] Other (See Comments)    Spaced out   Outpatient Encounter Medications as of 12/11/2022  Medication Sig   acetaminophen (TYLENOL) 500 MG tablet Take 1,500 mg by mouth 2 (two) times daily as needed  for headache.    ALPRAZolam (XANAX) 0.5 MG tablet TAKE 1 TABLET BY MOUTH TWICE DAILY AS NEEDED FOR ANXIETY.   Aspirin-Acetaminophen-Caffeine (GOODY HEADACHE PO) Take 1 Package by mouth daily as needed.   baclofen (LIORESAL) 10 MG tablet Take 1 tablet (10 mg total) by mouth 2 (two) times daily.   diclofenac (VOLTAREN) 75 MG EC tablet TAKE 1 TABLET TWICE DAILY   diclofenac Sodium (VOLTAREN) 1 % GEL Apply 4 g topically 4 (four) times daily.   escitalopram (LEXAPRO) 10 MG tablet TAKE 1 TABLET EVERY DAY   omeprazole (PRILOSEC) 40 MG capsule TAKE 1 CAPSULE BY MOUTH TWICE DAILY.   topiramate (TOPAMAX) 50 MG tablet Take 1 tablet (50 mg total) by mouth 2 (two) times daily.   No facility-administered encounter medications on file as of 12/11/2022.    Past Surgical History:  Procedure Laterality Date   CESAREAN SECTION     CHOLECYSTECTOMY     ENDOMETRIAL ABLATION  2000   GASTRORRHAPHY  03/17/2018   Procedure: GASTRORRHAPHY;  Surgeon: Franky Macho, MD;  Location: [facility];  Service: General;;   HERNIA REPAIR     LAPAROTOMY N/A 03/17/2018   Procedure: EXPLORATORY LAPAROTOMY;  Surgeon: Franky Macho,  MD;  Location: [facility];  Service: General;  Laterality: N/A;    Family History  Problem Relation Age of Onset   Diabetes Father    Cancer Father        lung cancer   Heart disease Father    Heart disease Maternal Grandmother    Stroke Maternal Grandfather    Diabetes Paternal Grandmother    Colon cancer Neg Hx       Controlled substance contract: n/a     Review of Systems  Constitutional:  Negative for diaphoresis.  Eyes:  Negative for pain.  Respiratory:  Negative for shortness of breath.   Cardiovascular:  Negative for chest pain, palpitations and leg swelling.  Gastrointestinal:  Negative for abdominal pain.  Endocrine: Negative for polydipsia.  Skin:  Negative for rash.  Neurological:  Negative for dizziness, weakness and headaches.  Hematological:  Does not bruise/bleed easily.  All  other systems reviewed and are negative.      Objective:   Physical Exam Vitals and nursing note reviewed.  Constitutional:      General: She is not in acute distress.    Appearance: Normal appearance. She is well-developed.  HENT:     Head: Normocephalic.     Right Ear: Tympanic membrane normal.     Left Ear: Tympanic membrane normal.     Nose: Nose normal.     Mouth/Throat:     Mouth: Mucous membranes are moist.  Eyes:     Pupils: Pupils are equal, round, and reactive to light.  Neck:     Vascular: No carotid bruit or JVD.  Cardiovascular:     Rate and Rhythm: Normal rate and regular rhythm.     Heart sounds: Normal heart sounds.  Pulmonary:     Effort: Pulmonary effort is normal. No respiratory distress.     Breath sounds: Normal breath sounds. No wheezing or rales.  Chest:     Chest wall: No tenderness.  Abdominal:     General: Bowel sounds are normal. There is no distension or abdominal bruit.     Palpations: Abdomen is soft. There is no hepatomegaly, splenomegaly, mass or pulsatile mass.     Tenderness: There is no abdominal tenderness.  Musculoskeletal:        General: Normal range of motion.     Cervical back: Normal range of motion and neck supple.  Lymphadenopathy:     Cervical: No cervical adenopathy.  Skin:    General: Skin is warm and dry.  Neurological:     Mental Status: She is alert and oriented to person, place, and time.     Deep Tendon Reflexes: Reflexes are normal and symmetric.  Psychiatric:        Behavior: Behavior normal.        Thought Content: Thought content normal.        Judgment: Judgment normal.     BP 118/82   Pulse 78   Temp 98.2 F (36.8 C) (Temporal)   Resp 20   SpO2 95%         Assessment & Plan:  Tiffany Stewart comes in today with chief complaint of Medical Management of Chronic Issues   Diagnosis and orders addressed:  1. Gastroesophageal reflux disease without esophagitis Avoid spicy foods Do not eat 2 hours  prior to bedtime  - omeprazole (PRILOSEC) 40 MG capsule; Take 1 capsule (40 mg total) by mouth 2 (two) times daily.  Dispense: 180 capsule; Refill: 1  2. Irritable bowel syndrome  with constipation Watch diet to prevent extremes in bowel habits - CBC with Differential/Platelet - Lipid panel - CMP14+EGFR - baclofen (LIORESAL) 10 MG tablet; Take 1 tablet (10 mg total) by mouth 2 (two) times daily.  Dispense: 180 tablet; Refill: 1  3. Perforated peptic ulcer (HCC) Avoid motrin  4. HSP (hereditary spastic paraplegia) (HCC) Cannot stand  Needs to see biotech for leg  5. Anxiety state Stress management - Drug Screen 10 W/Conf, Se - ALPRAZolam (XANAX) 0.5 MG tablet; Take 1 tablet (0.5 mg total) by mouth 2 (two) times daily as needed. for anxiety  Dispense: 60 tablet; Refill: 5 - escitalopram (LEXAPRO) 10 MG tablet; Take 1 tablet (10 mg total) by mouth daily.  Dispense: 90 tablet; Refill: 1  6. Iron deficiency anemia, unspecified iron deficiency anemia type Labs pending  7. TOBACCO ABUSE Smoking cessation encouraged  8. Migraine with aura and with status migrainosus, not intractable Avoid caffeine - topiramate (TOPAMAX) 50 MG tablet; Take 1 tablet (50 mg total) by mouth 2 (two) times daily.  Dispense: 180 tablet; Refill: 1   Labs pending Health Maintenance reviewed Diet and exercise encouraged  Follow up plan: 6 months   Mary-Margaret Daphine Deutscher, FNP

## 2022-12-13 LAB — CBC WITH DIFFERENTIAL/PLATELET
Basophils Absolute: 0 10*3/uL (ref 0.0–0.2)
Basos: 1 %
EOS (ABSOLUTE): 0.1 10*3/uL (ref 0.0–0.4)
Eos: 2 %
Hematocrit: 38.5 % (ref 34.0–46.6)
Hemoglobin: 12.6 g/dL (ref 11.1–15.9)
Immature Grans (Abs): 0 10*3/uL (ref 0.0–0.1)
Immature Granulocytes: 0 %
Lymphocytes Absolute: 1.5 10*3/uL (ref 0.7–3.1)
Lymphs: 31 %
MCH: 31.4 pg (ref 26.6–33.0)
MCHC: 32.7 g/dL (ref 31.5–35.7)
MCV: 96 fL (ref 79–97)
Monocytes Absolute: 0.3 10*3/uL (ref 0.1–0.9)
Monocytes: 7 %
Neutrophils Absolute: 2.9 10*3/uL (ref 1.4–7.0)
Neutrophils: 59 %
Platelets: 333 10*3/uL (ref 150–450)
RBC: 4.01 x10E6/uL (ref 3.77–5.28)
RDW: 13.2 % (ref 11.7–15.4)
WBC: 4.9 10*3/uL (ref 3.4–10.8)

## 2022-12-13 LAB — DRUG SCREEN 10 W/CONF, SERUM
Amphetamines, IA: NEGATIVE ng/mL
Barbiturates, IA: NEGATIVE ug/mL
Benzodiazepines, IA: NEGATIVE ng/mL
Cocaine & Metabolite, IA: NEGATIVE ng/mL
Methadone, IA: NEGATIVE ng/mL
Opiates, IA: NEGATIVE ng/mL
Oxycodones, IA: NEGATIVE ng/mL
Phencyclidine, IA: NEGATIVE ng/mL
Propoxyphene, IA: NEGATIVE ng/mL
THC(Marijuana) Metabolite, IA: NEGATIVE ng/mL

## 2022-12-13 LAB — CMP14+EGFR
ALT: 14 IU/L (ref 0–32)
AST: 14 IU/L (ref 0–40)
Albumin/Globulin Ratio: 1.6 (ref 1.2–2.2)
Albumin: 4.4 g/dL (ref 3.9–4.9)
Alkaline Phosphatase: 89 IU/L (ref 44–121)
BUN/Creatinine Ratio: 27 — ABNORMAL HIGH (ref 9–23)
BUN: 21 mg/dL (ref 6–24)
Bilirubin Total: <0.2 mg/dL (ref 0.0–1.2)
CO2: 18 mmol/L — ABNORMAL LOW (ref 20–29)
Calcium: 8.9 mg/dL (ref 8.7–10.2)
Chloride: 108 mmol/L — ABNORMAL HIGH (ref 96–106)
Creatinine, Ser: 0.77 mg/dL (ref 0.57–1.00)
Globulin, Total: 2.7 g/dL (ref 1.5–4.5)
Glucose: 93 mg/dL (ref 70–99)
Potassium: 3.8 mmol/L (ref 3.5–5.2)
Sodium: 142 mmol/L (ref 134–144)
Total Protein: 7.1 g/dL (ref 6.0–8.5)
eGFR: 94 mL/min/{1.73_m2} (ref >59)

## 2022-12-13 LAB — LIPID PANEL
Chol/HDL Ratio: 3.1 ratio (ref 0.0–4.4)
Cholesterol, Total: 151 mg/dL (ref 100–199)
HDL: 49 mg/dL (ref >39)
LDL Chol Calc (NIH): 85 mg/dL (ref 0–99)
Triglycerides: 92 mg/dL (ref 0–149)
VLDL Cholesterol Cal: 17 mg/dL (ref 5–40)

## 2022-12-16 ENCOUNTER — Other Ambulatory Visit: Payer: Self-pay | Admitting: Nurse Practitioner

## 2022-12-16 DIAGNOSIS — M24571 Contracture, right ankle: Secondary | ICD-10-CM

## 2022-12-16 DIAGNOSIS — K581 Irritable bowel syndrome with constipation: Secondary | ICD-10-CM

## 2023-01-10 ENCOUNTER — Other Ambulatory Visit: Payer: Self-pay | Admitting: Nurse Practitioner

## 2023-01-10 DIAGNOSIS — G43101 Migraine with aura, not intractable, with status migrainosus: Secondary | ICD-10-CM

## 2023-01-14 ENCOUNTER — Other Ambulatory Visit: Payer: Self-pay | Admitting: Nurse Practitioner

## 2023-02-01 ENCOUNTER — Telehealth: Payer: Self-pay | Admitting: Nurse Practitioner

## 2023-02-01 MED ORDER — DICLOFENAC SODIUM 1 % EX GEL: 4.0000 g | Freq: Four times a day (QID) | CUTANEOUS | 1 refills | Status: AC

## 2023-02-01 NOTE — Telephone Encounter (Signed)
  Prescription Request  02/01/2023  Is this a "Controlled Substance" medicine?   Have you seen your PCP in the last 2 weeks? LOV 12/11/22 NOV 7/26  If YES, route message to pool  -  If NO, patient needs to be scheduled for appointment.  What is the name of the medication or equipment?  diclofenac (VOLTAREN) 75 MG EC tablet [Pharmacy Med Name: DICLOFENAC SODIUM DR 75 MG Tablet Delayed Release]    Have you contacted your pharmacy to request a refill? yes   Which pharmacy would you like this sent to? [facility] - [place], [place] - [address]   Patient notified that their request is being sent to the clinical staff for review and that they should receive a response within 2 business days.   .   Aware that medication was already denied but stated that she takes this every morning and every night so she does not understand why it was denied. She was made aware that she may not be able to get this refill until her appt

## 2023-02-01 NOTE — Telephone Encounter (Signed)
Pt has been seen with the year. Refill sent to [facility]

## 2023-02-15 ENCOUNTER — Other Ambulatory Visit: Payer: Self-pay | Admitting: Nurse Practitioner

## 2023-02-15 DIAGNOSIS — F411 Generalized anxiety disorder: Secondary | ICD-10-CM

## 2023-03-09 ENCOUNTER — Encounter: Payer: Self-pay | Admitting: Nurse Practitioner

## 2023-03-09 ENCOUNTER — Ambulatory Visit: Payer: Medicare HMO | Admitting: Nurse Practitioner

## 2023-03-09 VITALS — BP 124/87 | HR 72 | Temp 97.8°F | Ht 60.0 in

## 2023-03-09 DIAGNOSIS — K219 Gastro-esophageal reflux disease without esophagitis: Secondary | ICD-10-CM | POA: Diagnosis not present

## 2023-03-09 DIAGNOSIS — F17200 Nicotine dependence, unspecified, uncomplicated: Secondary | ICD-10-CM | POA: Diagnosis not present

## 2023-03-09 DIAGNOSIS — F411 Generalized anxiety disorder: Secondary | ICD-10-CM | POA: Diagnosis not present

## 2023-03-09 DIAGNOSIS — K581 Irritable bowel syndrome with constipation: Secondary | ICD-10-CM

## 2023-03-09 DIAGNOSIS — D509 Iron deficiency anemia, unspecified: Secondary | ICD-10-CM | POA: Diagnosis not present

## 2023-03-09 DIAGNOSIS — G43101 Migraine with aura, not intractable, with status migrainosus: Secondary | ICD-10-CM

## 2023-03-09 DIAGNOSIS — G114 Hereditary spastic paraplegia: Secondary | ICD-10-CM | POA: Diagnosis not present

## 2023-03-09 MED ORDER — ESCITALOPRAM OXALATE 10 MG PO TABS
10.0000 mg | ORAL_TABLET | Freq: Every day | ORAL | 0 refills | Status: DC
Start: 2023-03-09 — End: 2023-04-30

## 2023-03-09 MED ORDER — TOPIRAMATE 100 MG PO TABS
100.0000 mg | ORAL_TABLET | Freq: Two times a day (BID) | ORAL | 2 refills | Status: DC
Start: 2023-03-09 — End: 2023-06-29

## 2023-03-09 NOTE — Patient Instructions (Signed)

## 2023-03-09 NOTE — Progress Notes (Signed)
Subjective:    Patient ID: Tiffany Stewart, female    DOB: [birth year], 52 y.o.   MRN: [id number]   Chief Complaint: medical management of chronic issues     HPI:  Tiffany Stewart is a 52 y.o. who identifies as a female who was assigned female at birth.   Social history: Lives with: daughter Work history: disability   Comes in today for follow up of the following chronic medical issues:  1. HSP (hereditary spastic paraplegia) (HCC) Patient is in wheel chair. Does not get up much. She is able to get in an dout of bed, dress herself and get on and off toilet on her own.   2. Migraine with aura and with status migrainosus, not intractable Has occasional migraines. Has frequent headaches but tylenol works well to relieve.  3. Gastroesophageal reflux disease without esophagitis Is on ompprazole and is doing well. Has history of gastric bleeding ulcer.  4. Irritable bowel syndrome with constipation No issues currently. Tries to control with diet. Does not take anything at home for constipation.  5. Anxiety state Stays anxious is on xanax BID and lexapro daily    03/09/2023    2:40 PM 12/11/2022    3:33 PM 06/09/2022    4:15 PM 11/01/2021    4:12 PM  GAD 7 : Generalized Anxiety Score  Nervous, Anxious, on Edge 0 1 1 1   Control/stop worrying 0 1 1 1   Worry too much - different things 2 1 1 1   Trouble relaxing  1 1 1   Restless  0 0 0  Easily annoyed or irritable 0 1 0 0  Afraid - awful might happen 0 1 1 1   Total GAD 7 Score  6 5 5   Anxiety Difficulty Not difficult at all Not difficult at all Not difficult at all Not difficult at all    Vapes al day long  6. Iron deficiency anemia, unspecified iron deficiency anemia type On no iron supplements Lab Results  Component Value Date   HGB 12.6 12/11/2022     7. TOBACCO ABUSE Vapes all day long   New complaints: None today  Allergies  Allergen Reactions   Hctz [Hydrochlorothiazide] Diarrhea   Morphine Other (See  Comments)    Flushing, turns skin red    Zoloft [Sertraline Hcl] Other (See Comments)    Spaced out   Outpatient Encounter Medications as of 03/09/2023  Medication Sig   acetaminophen (TYLENOL) 500 MG tablet Take 1,500 mg by mouth 2 (two) times daily as needed for headache.    ALPRAZolam (XANAX) 0.5 MG tablet Take 1 tablet (0.5 mg total) by mouth 2 (two) times daily as needed. for anxiety   Aspirin-Acetaminophen-Caffeine (GOODY HEADACHE PO) Take 1 Package by mouth daily as needed.   baclofen (LIORESAL) 10 MG tablet Take 1 tablet (10 mg total) by mouth 2 (two) times daily.   diclofenac Sodium (VOLTAREN) 1 % GEL Apply 4 g topically 4 (four) times daily.   escitalopram (LEXAPRO) 10 MG tablet TAKE 1 TABLET EVERY DAY   omeprazole (PRILOSEC) 40 MG capsule Take 1 capsule (40 mg total) by mouth 2 (two) times daily.   topiramate (TOPAMAX) 50 MG tablet TAKE (1) TABLET TWICE DAILY.   No facility-administered encounter medications on file as of 03/09/2023.    Past Surgical History:  Procedure Laterality Date   CESAREAN SECTION     CHOLECYSTECTOMY     ENDOMETRIAL ABLATION  2000   GASTRORRHAPHY  03/17/2018   Procedure: GASTRORRHAPHY;  Surgeon: Franky Macho, MD;  Location: [facility];  Service: General;;   HERNIA REPAIR     LAPAROTOMY N/A 03/17/2018   Procedure: EXPLORATORY LAPAROTOMY;  Surgeon: Franky Macho, MD;  Location: [facility];  Service: General;  Laterality: N/A;    Family History  Problem Relation Age of Onset   Diabetes Father    Cancer Father        lung cancer   Heart disease Father    Heart disease Maternal Grandmother    Stroke Maternal Grandfather    Diabetes Paternal Grandmother    Colon cancer Neg Hx       Controlled substance contract: n/a     Review of Systems  Constitutional:  Negative for diaphoresis.  Eyes:  Negative for pain.  Respiratory:  Negative for shortness of breath.   Cardiovascular:  Negative for chest pain, palpitations and leg swelling.   Gastrointestinal:  Negative for abdominal pain.  Endocrine: Negative for polydipsia.  Skin:  Negative for rash.  Neurological:  Negative for dizziness, weakness and headaches.  Hematological:  Does not bruise/bleed easily.  All other systems reviewed and are negative.      Objective:   Physical Exam Vitals and nursing note reviewed.  Constitutional:      General: She is not in acute distress.    Appearance: Normal appearance. She is well-developed.  HENT:     Head: Normocephalic.     Right Ear: Tympanic membrane normal.     Left Ear: Tympanic membrane normal.     Nose: Nose normal.     Mouth/Throat:     Mouth: Mucous membranes are moist.  Eyes:     Pupils: Pupils are equal, round, and reactive to light.  Neck:     Vascular: No carotid bruit or JVD.  Cardiovascular:     Rate and Rhythm: Normal rate and regular rhythm.     Heart sounds: Normal heart sounds.  Pulmonary:     Effort: Pulmonary effort is normal. No respiratory distress.     Breath sounds: Normal breath sounds. No wheezing or rales.  Chest:     Chest wall: No tenderness.  Abdominal:     General: Bowel sounds are normal. There is no distension or abdominal bruit.     Palpations: Abdomen is soft. There is no hepatomegaly, splenomegaly, mass or pulsatile mass.     Tenderness: There is no abdominal tenderness.  Musculoskeletal:        General: Normal range of motion.     Cervical back: Normal range of motion and neck supple.  Lymphadenopathy:     Cervical: No cervical adenopathy.  Skin:    General: Skin is warm and dry.  Neurological:     Mental Status: She is alert and oriented to person, place, and time.     Deep Tendon Reflexes: Reflexes are normal and symmetric.  Psychiatric:        Behavior: Behavior normal.        Thought Content: Thought content normal.        Judgment: Judgment normal.    BP 124/87   Pulse 72   Temp 97.8 F (36.6 C) (Temporal)   Ht 5' (1.524 m)   SpO2 96%   BMI 22.85 kg/m          Assessment & Plan:   [name] comes in today with chief complaint of Medical Management of Chronic Issues   Diagnosis and orders addressed:  1. HSP (hereditary spastic paraplegia) (HCC) Will se where  we can send her for leg braces - CBC with Differential/Platelet - CMP14+EGFR - Lipid panel  2. Migraine with aura and with status migrainosus, not intractable Avoid caffeine Keep diary of migraines Increase topamax to 100mg  BID 3. Gastroesophageal reflux disease without esophagitis Avoid spicy foods Do not eat 2 hours prior to bedtime  4. Irritable bowel syndrome with constipation Miralax daily  5. Anxiety state Stress management - escitalopram (LEXAPRO) 10 MG tablet; Take 1 tablet (10 mg total) by mouth daily.  Dispense: 90 tablet; Refill: 0  6. Iron deficiency anemia, unspecified iron deficiency anemia type Labs pending  7. TOBACCO ABUSE Stop vaping   Labs pending Health Maintenance reviewed Diet and exercise encouraged  Follow up plan: 3 months   Mary-Margaret Daphine Deutscher, FNP

## 2023-03-09 NOTE — Addendum Note (Signed)
Addended by: Bennie Pierini on: 03/09/2023 04:04 PM   Modules accepted: Level of Service

## 2023-03-09 NOTE — Addendum Note (Signed)
Addended by: Bennie Pierini on: 03/09/2023 03:11 PM   Modules accepted: Orders

## 2023-04-30 ENCOUNTER — Other Ambulatory Visit: Payer: Self-pay | Admitting: Nurse Practitioner

## 2023-04-30 DIAGNOSIS — F411 Generalized anxiety disorder: Secondary | ICD-10-CM

## 2023-06-11 ENCOUNTER — Ambulatory Visit: Payer: Medicare HMO | Admitting: Nurse Practitioner

## 2023-06-11 NOTE — Progress Notes (Deleted)
   Subjective:    Patient ID: Tiffany Stewart, female    DOB: 1971/03/29, 52 y.o.   MRN: [id number]   Chief Complaint: medical management of chronic issues     HPI:  Tiffany Stewart is a 52 y.o. who identifies as a female who was assigned female at birth.   Social history: Lives with: daughter Work history: disability   Comes in today for follow up of the following chronic medical issues:  1. HSP (hereditary spastic paraplegia) (HCC) Stays in wheel chair most of the time. Baclofen helps with her spasms  2. Migraine with aura and with status migrainosus, not intractable ***  3. Gastroesophageal reflux disease without esophagitis Is on omeprazole daily and is doing well.  4. Irritable bowel syndrome with constipation No recent flare ups  5. Anxiety state Is on xanax bid. ***  6. Iron deficiency anemia, unspecified iron deficiency anemia type No c/o fatigue  7. TOBACCO ABUSE Smokes over a pack a day   New complaints: None today  Allergies  Allergen Reactions   Hctz [Hydrochlorothiazide] Diarrhea   Morphine Other (See Comments)    Flushing, turns skin red    Zoloft [Sertraline Hcl] Other (See Comments)    Spaced out   Outpatient Encounter Medications as of 06/11/2023  Medication Sig   acetaminophen (TYLENOL) 500 MG tablet Take 1,500 mg by mouth 2 (two) times daily as needed for headache.    ALPRAZolam (XANAX) 0.5 MG tablet Take 1 tablet (0.5 mg total) by mouth 2 (two) times daily as needed. for anxiety   baclofen (LIORESAL) 10 MG tablet Take 1 tablet (10 mg total) by mouth 2 (two) times daily.   diclofenac Sodium (VOLTAREN) 1 % GEL Apply 4 g topically 4 (four) times daily.   escitalopram (LEXAPRO) 10 MG tablet TAKE 1 TABLET EVERY DAY   omeprazole (PRILOSEC) 40 MG capsule Take 1 capsule (40 mg total) by mouth 2 (two) times daily.   topiramate (TOPAMAX) 100 MG tablet Take 1 tablet (100 mg total) by mouth 2 (two) times daily.   No facility-administered encounter  medications on file as of 06/11/2023.    Past Surgical History:  Procedure Laterality Date   CESAREAN SECTION     CHOLECYSTECTOMY     ENDOMETRIAL ABLATION  2000   GASTRORRHAPHY  03/17/2018   Procedure: GASTRORRHAPHY;  Surgeon: Franky Macho, MD;  Location: [facility];  Service: General;;   HERNIA REPAIR     LAPAROTOMY N/A 03/17/2018   Procedure: EXPLORATORY LAPAROTOMY;  Surgeon: Franky Macho, MD;  Location: [facility];  Service: General;  Laterality: N/A;    Family History  Problem Relation Age of Onset   Diabetes Father    Cancer Father        lung cancer   Heart disease Father    Heart disease Maternal Grandmother    Stroke Maternal Grandfather    Diabetes Paternal Grandmother    Colon cancer Neg Hx       Controlled substance contract: ***     Review of Systems     Objective:   Physical Exam        Assessment & Plan:

## 2023-06-13 ENCOUNTER — Encounter: Payer: Self-pay | Admitting: Nurse Practitioner

## 2023-06-29 ENCOUNTER — Encounter: Payer: Self-pay | Admitting: Nurse Practitioner

## 2023-06-29 ENCOUNTER — Ambulatory Visit (INDEPENDENT_AMBULATORY_CARE_PROVIDER_SITE_OTHER): Payer: Medicare HMO | Admitting: Nurse Practitioner

## 2023-06-29 VITALS — BP 127/84 | HR 76 | Temp 97.6°F | Resp 20

## 2023-06-29 DIAGNOSIS — D509 Iron deficiency anemia, unspecified: Secondary | ICD-10-CM

## 2023-06-29 DIAGNOSIS — K219 Gastro-esophageal reflux disease without esophagitis: Secondary | ICD-10-CM | POA: Diagnosis not present

## 2023-06-29 DIAGNOSIS — F411 Generalized anxiety disorder: Secondary | ICD-10-CM | POA: Diagnosis not present

## 2023-06-29 DIAGNOSIS — F17200 Nicotine dependence, unspecified, uncomplicated: Secondary | ICD-10-CM | POA: Diagnosis not present

## 2023-06-29 DIAGNOSIS — K581 Irritable bowel syndrome with constipation: Secondary | ICD-10-CM | POA: Diagnosis not present

## 2023-06-29 DIAGNOSIS — G43101 Migraine with aura, not intractable, with status migrainosus: Secondary | ICD-10-CM | POA: Diagnosis not present

## 2023-06-29 DIAGNOSIS — G114 Hereditary spastic paraplegia: Secondary | ICD-10-CM

## 2023-06-29 MED ORDER — ALPRAZOLAM 0.5 MG PO TABS
0.5000 mg | ORAL_TABLET | Freq: Two times a day (BID) | ORAL | 5 refills | Status: DC | PRN
Start: 2023-06-29 — End: 2024-01-21

## 2023-06-29 MED ORDER — BACLOFEN 10 MG PO TABS
10.0000 mg | ORAL_TABLET | Freq: Two times a day (BID) | ORAL | 1 refills | Status: DC
Start: 2023-06-29 — End: 2024-01-17

## 2023-06-29 MED ORDER — TOPIRAMATE 100 MG PO TABS
100.0000 mg | ORAL_TABLET | Freq: Two times a day (BID) | ORAL | 1 refills | Status: DC
Start: 2023-06-29 — End: 2024-01-17

## 2023-06-29 MED ORDER — OMEPRAZOLE 40 MG PO CPDR
40.0000 mg | DELAYED_RELEASE_CAPSULE | Freq: Two times a day (BID) | ORAL | 1 refills | Status: DC
Start: 2023-06-29 — End: 2024-01-17

## 2023-06-29 MED ORDER — ESCITALOPRAM OXALATE 10 MG PO TABS
10.0000 mg | ORAL_TABLET | Freq: Every day | ORAL | 1 refills | Status: DC
Start: 2023-06-29 — End: 2023-07-09

## 2023-06-29 NOTE — Patient Instructions (Signed)
Fall Prevention in the Home, Adult Falls can cause injuries and can happen to people of all ages. There are many things you can do to make your home safer and to help prevent falls. What actions can I take to prevent falls? General information Use good lighting in all rooms. Make sure to: Replace any light bulbs that burn out. Turn on the lights in dark areas and use night-lights. Keep items that you use often in easy-to-reach places. Lower the shelves around your home if needed. Move furniture so that there are clear paths around it. Do not use throw rugs or other things on the floor that can make you trip. If any of your floors are uneven, fix them. Add color or contrast paint or tape to clearly mark and help you see: Grab bars or handrails. First and last steps of staircases. Where the edge of each step is. If you use a ladder or stepladder: Make sure that it is fully opened. Do not climb a closed ladder. Make sure the sides of the ladder are locked in place. Have someone hold the ladder while you use it. Know where your pets are as you move through your home. What can I do in the bathroom?     Keep the floor dry. Clean up any water on the floor right away. Remove soap buildup in the bathtub or shower. Buildup makes bathtubs and showers slippery. Use non-skid mats or decals on the floor of the bathtub or shower. Attach bath mats securely with double-sided, non-slip rug tape. If you need to sit down in the shower, use a non-slip stool. Install grab bars by the toilet and in the bathtub and shower. Do not use towel bars as grab bars. What can I do in the bedroom? Make sure that you have a light by your bed that is easy to reach. Do not use any sheets or blankets on your bed that hang to the floor. Have a firm chair or bench with side arms that you can use for support when you get dressed. What can I do in the kitchen? Clean up any spills right away. If you need to reach something  above you, use a step stool with a grab bar. Keep electrical cords out of the way. Do not use floor polish or wax that makes floors slippery. What can I do with my stairs? Do not leave anything on the stairs. Make sure that you have a light switch at the top and the bottom of the stairs. Make sure that there are handrails on both sides of the stairs. Fix handrails that are broken or loose. Install non-slip stair treads on all your stairs if they do not have carpet. Avoid having throw rugs at the top or bottom of the stairs. Choose a carpet that does not hide the edge of the steps on the stairs. Make sure that the carpet is firmly attached to the stairs. Fix carpet that is loose or worn. What can I do on the outside of my home? Use bright outdoor lighting. Fix the edges of walkways and driveways and fix any cracks. Clear paths of anything that can make you trip, such as tools or rocks. Add color or contrast paint or tape to clearly mark and help you see anything that might make you trip as you walk through a door, such as a raised step or threshold. Trim any bushes or trees on paths to your home. Check to see if handrails are loose   or broken and that both sides of all steps have handrails. Install guardrails along the edges of any raised decks and porches. Have leaves, snow, or ice cleared regularly. Use sand, salt, or ice melter on paths if you live where there is ice and snow during the winter. Clean up any spills in your garage right away. This includes grease or oil spills. What other actions can I take? Review your medicines with your doctor. Some medicines can cause dizziness or changes in blood pressure, which increase your risk of falling. Wear shoes that: Have a low heel. Do not wear high heels. Have rubber bottoms and are closed at the toe. Feel good on your feet and fit well. Use tools that help you move around if needed. These include: Canes. Walkers. Scooters. Crutches. Ask  your doctor what else you can do to help prevent falls. This may include seeing a physical therapist to learn to do exercises to move better and get stronger. Where to find more information Centers for Disease Control and Prevention, [employer]: cdc.gov National Institute on Aging: nia.nih.gov National Institute on Aging: nia.nih.gov Contact a doctor if: You are afraid of falling at home. You feel weak, drowsy, or dizzy at home. You fall at home. Get help right away if you: Lose consciousness or have trouble moving after a fall. Have a fall that causes a head injury. These symptoms may be an emergency. Get help right away. Call 911. Do not wait to see if the symptoms will go away. Do not drive yourself to the hospital. This information is not intended to replace advice given to you by your health care provider. Make sure you discuss any questions you have with your health care provider. Document Revised: 04/03/2022 Document Reviewed: 04/03/2022 Elsevier Patient Education  2024 Elsevier Inc.  

## 2023-06-29 NOTE — Progress Notes (Signed)
Subjective:    Patient ID: Tiffany Stewart, female    DOB: 23-Sep-1970, 52 y.o.   MRN: [id number]   Chief Complaint: medical management of chronic issues     HPI:  Tiffany Stewart is a 52 y.o. who identifies as a female who was assigned female at birth.   Social history: Lives with: daughter Work history: disability   Comes in today for follow up of the following chronic medical issues:  1. Gastroesophageal reflux disease without esophagitis Is on omeprazole and is doing well  2. Irritable bowel syndrome with constipation No recent flare ups. If she watches diet itis better  3. Anxiety state Is on combination of lexapro and xanax.    06/29/2023   12:10 PM 03/09/2023    2:40 PM 12/11/2022    3:33 PM 06/09/2022    4:15 PM  GAD 7 : Generalized Anxiety Score  Nervous, Anxious, on Edge 0 0 1 1  Control/stop worrying 0 0 1 1  Worry too much - different things 0 2 1 1   Trouble relaxing 0  1 1  Restless 0  0 0  Easily annoyed or irritable 0 0 1 0  Afraid - awful might happen 0 0 1 1  Total GAD 7 Score 0  6 5  Anxiety Difficulty Not difficult at all Not difficult at all Not difficult at all Not difficult at all      4. Iron deficiency anemia, unspecified iron deficiency anemia type No c/o fatigue. Is on no iron supplements Lab Results  Component Value Date   HGB 12.6 12/11/2022     5. HSP (hereditary spastic paraplegia) (HCC) Stays in wheel chair most of the time  6. TOBACCO ABUSE Vapes now   New complaints: None today  Allergies  Allergen Reactions   Hctz [Hydrochlorothiazide] Diarrhea   Morphine Other (See Comments)    Flushing, turns skin red    Zoloft [Sertraline Hcl] Other (See Comments)    Spaced out   Outpatient Encounter Medications as of 06/29/2023  Medication Sig   acetaminophen (TYLENOL) 500 MG tablet Take 1,500 mg by mouth 2 (two) times daily as needed for headache.    ALPRAZolam (XANAX) 0.5 MG tablet Take 1 tablet (0.5 mg total) by mouth 2  (two) times daily as needed. for anxiety   baclofen (LIORESAL) 10 MG tablet Take 1 tablet (10 mg total) by mouth 2 (two) times daily.   diclofenac Sodium (VOLTAREN) 1 % GEL Apply 4 g topically 4 (four) times daily.   escitalopram (LEXAPRO) 10 MG tablet TAKE 1 TABLET EVERY DAY   omeprazole (PRILOSEC) 40 MG capsule Take 1 capsule (40 mg total) by mouth 2 (two) times daily.   topiramate (TOPAMAX) 100 MG tablet Take 1 tablet (100 mg total) by mouth 2 (two) times daily.   No facility-administered encounter medications on file as of 06/29/2023.    Past Surgical History:  Procedure Laterality Date   CESAREAN SECTION     CHOLECYSTECTOMY     ENDOMETRIAL ABLATION  2000   GASTRORRHAPHY  03/17/2018   Procedure: GASTRORRHAPHY;  Surgeon: Franky Macho, MD;  Location: [facility];  Service: General;;   HERNIA REPAIR     LAPAROTOMY N/A 03/17/2018   Procedure: EXPLORATORY LAPAROTOMY;  Surgeon: Franky Macho, MD;  Location: [facility];  Service: General;  Laterality: N/A;    Family History  Problem Relation Age of Onset   Diabetes Father    Cancer Father        lung  cancer   Heart disease Father    Heart disease Maternal Grandmother    Stroke Maternal Grandfather    Diabetes Paternal Grandmother    Colon cancer Neg Hx       Controlled substance contract: 12/14/22     Review of Systems  Constitutional:  Negative for diaphoresis.  Eyes:  Negative for pain.  Respiratory:  Negative for shortness of breath.   Cardiovascular:  Negative for chest pain, palpitations and leg swelling.  Gastrointestinal:  Negative for abdominal pain.  Endocrine: Negative for polydipsia.  Skin:  Negative for rash.  Neurological:  Negative for dizziness, weakness and headaches.  Hematological:  Does not bruise/bleed easily.  All other systems reviewed and are negative.      Objective:   Physical Exam Vitals and nursing note reviewed.  Constitutional:      General: She is not in acute distress.    Appearance: Normal  appearance. She is well-developed.  HENT:     Head: Normocephalic.     Right Ear: Tympanic membrane normal.     Left Ear: Tympanic membrane normal.     Nose: Nose normal.     Mouth/Throat:     Mouth: Mucous membranes are moist.  Eyes:     Pupils: Pupils are equal, round, and reactive to light.  Neck:     Vascular: No carotid bruit or JVD.  Cardiovascular:     Rate and Rhythm: Normal rate and regular rhythm.     Heart sounds: Normal heart sounds.  Pulmonary:     Effort: Pulmonary effort is normal. No respiratory distress.     Breath sounds: Normal breath sounds. No wheezing or rales.  Chest:     Chest wall: No tenderness.  Abdominal:     General: Bowel sounds are normal. There is no distension or abdominal bruit.     Palpations: Abdomen is soft. There is no hepatomegaly, splenomegaly, mass or pulsatile mass.     Tenderness: There is no abdominal tenderness.  Musculoskeletal:        General: Normal range of motion.     Cervical back: Normal range of motion and neck supple.  Lymphadenopathy:     Cervical: No cervical adenopathy.  Skin:    General: Skin is warm and dry.  Neurological:     Mental Status: She is alert and oriented to person, place, and time.     Deep Tendon Reflexes: Reflexes are normal and symmetric.  Psychiatric:        Behavior: Behavior normal.        Thought Content: Thought content normal.        Judgment: Judgment normal.    BP 127/84   Pulse 76   Temp 97.6 F (36.4 C) (Temporal)   Resp 20         Assessment & Plan:   Tiffany Stewart comes in today with chief complaint of Medical Management of Chronic Issues   Diagnosis and orders addressed:  1. Gastroesophageal reflux disease without esophagitis Avoid spicy foods Do not eat 2 hours prior to bedtime  - omeprazole (PRILOSEC) 40 MG capsule; Take 1 capsule (40 mg total) by mouth 2 (two) times daily.  Dispense: 180 capsule; Refill: 1  2. Irritable bowel syndrome with constipation Watch diet  to prevent flare up - baclofen (LIORESAL) 10 MG tablet; Take 1 tablet (10 mg total) by mouth 2 (two) times daily.  Dispense: 180 tablet; Refill: 1  3. Anxiety state Stres smanaegment - ALPRAZolam (XANAX) 0.5 MG  tablet; Take 1 tablet (0.5 mg total) by mouth 2 (two) times daily as needed. for anxiety  Dispense: 60 tablet; Refill: 5 - escitalopram (LEXAPRO) 10 MG tablet; Take 1 tablet (10 mg total) by mouth daily.  Dispense: 90 tablet; Refill: 1  4. Iron deficiency anemia, unspecified iron deficiency anemia type - CBC with Differential/Platelet - CMP14+EGFR - Lipid panel  5. HSP (hereditary spastic paraplegia) (HCC) Fall prevention  6. TOBACCO ABUSE Stop vaping  7. Migraine with aura and with status migrainosus, not intractable Avoid caffeine - topiramate (TOPAMAX) 100 MG tablet; Take 1 tablet (100 mg total) by mouth 2 (two) times daily.  Dispense: 180 tablet; Refill: 1   Labs pending Health Maintenance reviewed Diet and exercise encouraged  Follow up plan: 6 months   Mary-Margaret Daphine Deutscher, FNP

## 2023-06-30 LAB — CMP14+EGFR
ALT: 27 [IU]/L (ref 0–32)
AST: 25 [IU]/L (ref 0–40)
Albumin: 4.4 g/dL (ref 3.9–4.9)
Alkaline Phosphatase: 70 [IU]/L (ref 44–121)
BUN/Creatinine Ratio: 35 — ABNORMAL HIGH (ref 9–23)
BUN: 23 mg/dL (ref 6–24)
Bilirubin Total: 0.2 mg/dL (ref 0.0–1.2)
CO2: 20 mmol/L (ref 20–29)
Calcium: 8.7 mg/dL (ref 8.7–10.2)
Chloride: 111 mmol/L — ABNORMAL HIGH (ref 96–106)
Creatinine, Ser: 0.66 mg/dL (ref 0.57–1.00)
Globulin, Total: 2.3 g/dL (ref 1.5–4.5)
Glucose: 95 mg/dL (ref 70–99)
Potassium: 3.9 mmol/L (ref 3.5–5.2)
Sodium: 144 mmol/L (ref 134–144)
Total Protein: 6.7 g/dL (ref 6.0–8.5)
eGFR: 107 mL/min/{1.73_m2} (ref >59)

## 2023-06-30 LAB — CBC WITH DIFFERENTIAL/PLATELET
Basophils Absolute: 0.1 10*3/uL (ref 0.0–0.2)
Basos: 1 %
EOS (ABSOLUTE): 0.1 10*3/uL (ref 0.0–0.4)
Eos: 2 %
Hematocrit: 36.6 % (ref 34.0–46.6)
Hemoglobin: 12 g/dL (ref 11.1–15.9)
Immature Grans (Abs): 0 10*3/uL (ref 0.0–0.1)
Immature Granulocytes: 0 %
Lymphocytes Absolute: 1.6 10*3/uL (ref 0.7–3.1)
Lymphs: 34 %
MCH: 32.8 pg (ref 26.6–33.0)
MCHC: 32.8 g/dL (ref 31.5–35.7)
MCV: 100 fL — ABNORMAL HIGH (ref 79–97)
Monocytes Absolute: 0.2 10*3/uL (ref 0.1–0.9)
Monocytes: 5 %
Neutrophils Absolute: 2.6 10*3/uL (ref 1.4–7.0)
Neutrophils: 58 %
Platelets: 295 10*3/uL (ref 150–450)
RBC: 3.66 x10E6/uL — ABNORMAL LOW (ref 3.77–5.28)
RDW: 12.8 % (ref 11.7–15.4)
WBC: 4.6 10*3/uL (ref 3.4–10.8)

## 2023-06-30 LAB — LIPID PANEL
Chol/HDL Ratio: 4.5 ratio — ABNORMAL HIGH (ref 0.0–4.4)
Cholesterol, Total: 184 mg/dL (ref 100–199)
HDL: 41 mg/dL (ref >39)
LDL Chol Calc (NIH): 119 mg/dL — ABNORMAL HIGH (ref 0–99)
Triglycerides: 134 mg/dL (ref 0–149)
VLDL Cholesterol Cal: 24 mg/dL (ref 5–40)

## 2023-07-07 ENCOUNTER — Other Ambulatory Visit: Payer: Self-pay | Admitting: Nurse Practitioner

## 2023-07-07 DIAGNOSIS — F411 Generalized anxiety disorder: Secondary | ICD-10-CM

## 2023-09-13 ENCOUNTER — Ambulatory Visit: Payer: Self-pay | Admitting: Nurse Practitioner

## 2023-09-13 NOTE — Telephone Encounter (Signed)
Chief Complaint: Leg swelling Symptoms: Bilateral ankle swelling Frequency: Ongoing "since I was disabled" Pertinent Negatives: Patient denies SOB, CP, fever Disposition: [] ED /[] Urgent Care (no appt availability in office) / [x] Appointment(In office/virtual)/ []  [facility]/ [] Home Care/ [x] Refused Recommended Disposition /[] [facility]/ []  Follow-up with PCP Additional Notes: Pt reports she has had ongoing leg swelling in her bilateral lower legs and ankles. She reports it is the "same as it always is" but she has been without the medicine she takes for the swelling for about 3 months, she reports issues with the pharmacy. Pt was unable to tell this RN what the name of the medication was but states it is "a big white pill". Pt declines appt, states she just needs her medication. This RN educated pt on home care, new-worsening symptoms, when to call back/seek emergent care. Pt verbalized understanding and agrees to plan.   Copied from CRM [id number]. Topic: Clinical - Red Word Triage >> Sep 13, 2023 11:28 AM Fuller Mandril wrote: Red Word that prompted transfer to Nurse Triage: Legs swelling - was sent incorrect medication. No medication for 3 months. Reason for Disposition  [1] MILD swelling of both ankles (i.e., pedal edema) AND [2] is a chronic symptom (recurrent or ongoing AND present > 4 weeks)  Answer Assessment - Initial Assessment Questions 1. ONSET: "When did the swelling start?" (e.g., minutes, hours, days)     "Since I became disabled" 2. LOCATION: "What part of the leg is swollen?"  "Are both legs swollen or just one leg?"     Bilateral ankles 3. SEVERITY: "How bad is the swelling?" (e.g., localized; mild, moderate, severe)   - Localized: Small area of swelling localized to one leg.   - MILD pedal edema: Swelling limited to foot and ankle, pitting edema < 1/4 inch (6 mm) deep, rest and elevation eliminate most or all swelling.   - MODERATE edema: Swelling of lower  leg to knee, pitting edema > 1/4 inch (6 mm) deep, rest and elevation only partially reduce swelling.   - SEVERE edema: Swelling extends above knee, facial or hand swelling present.      Worsening 4. REDNESS: "Does the swelling look red or infected?"     None 5. PAIN: "Is the swelling painful to touch?" If Yes, ask: "How painful is it?"   (Scale 1-10; mild, moderate or severe)     None  10. OTHER SYMPTOMS: "Do you have any other symptoms?" (e.g., chest pain, difficulty breathing)       None  Protocols used: Leg Swelling and Edema-A-AH

## 2023-09-20 ENCOUNTER — Other Ambulatory Visit: Payer: Self-pay | Admitting: Nurse Practitioner

## 2023-09-20 DIAGNOSIS — F411 Generalized anxiety disorder: Secondary | ICD-10-CM

## 2023-12-02 ENCOUNTER — Other Ambulatory Visit: Payer: Self-pay | Admitting: Nurse Practitioner

## 2023-12-02 DIAGNOSIS — F411 Generalized anxiety disorder: Secondary | ICD-10-CM

## 2023-12-03 ENCOUNTER — Ambulatory Visit (INDEPENDENT_AMBULATORY_CARE_PROVIDER_SITE_OTHER): Payer: Medicare HMO

## 2023-12-03 VITALS — BP 127/84 | HR 76 | Ht 61.0 in | Wt 130.0 lb

## 2023-12-03 DIAGNOSIS — Z0000 Encounter for general adult medical examination without abnormal findings: Secondary | ICD-10-CM

## 2023-12-03 NOTE — Progress Notes (Signed)
 Subjective:   Tiffany Stewart is a 53 y.o. who presents for a Medicare Wellness preventive visit.  Visit Complete: Virtual I connected with  Tiffany Stewart on 12/03/23 by a audio enabled telemedicine application and verified that I am speaking with the correct person using two identifiers.  Patient Location: Home  Provider Location: Home Office  I discussed the limitations of evaluation and management by telemedicine. The patient expressed understanding and agreed to proceed.  Vital Signs: Because this visit was a virtual/telehealth visit, some criteria may be missing or patient reported. Any vitals not documented were not able to be obtained and vitals that have been documented are patient reported.  VideoDeclined- This patient declined Librarian, academic. Therefore the visit was completed with audio only.  Persons Participating in Visit: Patient.  AWV Questionnaire: No: Patient Medicare AWV questionnaire was not completed prior to this visit.  Cardiac Risk Factors include: advanced age (>87men, >28 women)     Objective:    Today's Vitals   12/03/23 1315  BP: 127/84  Pulse: 76  Weight: 130 lb (59 kg)  Height: 5\' 1"  (1.549 m)   Body mass index is 24.56 kg/m.     12/03/2023    1:30 PM 10/19/2021    8:39 AM 10/18/2020    8:27 AM 08/29/2020    9:49 AM 08/28/2020   11:49 PM 10/13/2019    8:40 AM 04/05/2018   11:00 PM  Advanced Directives  Does Patient Have a Medical Advance Directive? No Yes No No Unable to assess, patient is non-responsive or altered mental status No No  Type of Advance Directive  Healthcare Power of [employer] of Healthcare Power of Attorney in Chart?  No - copy requested       Would patient like information on creating a medical advance directive?   No - Guardian declined No - Patient declined  No - Patient declined No - Patient declined    Current Medications (verified) Outpatient Encounter Medications as of 12/03/2023   Medication Sig   acetaminophen  (TYLENOL ) 500 MG tablet Take 1,500 mg by mouth 2 (two) times daily as needed for headache.    ALPRAZolam  (XANAX ) 0.5 MG tablet Take 1 tablet (0.5 mg total) by mouth 2 (two) times daily as needed. for anxiety   baclofen  (LIORESAL ) 10 MG tablet Take 1 tablet (10 mg total) by mouth 2 (two) times daily.   diclofenac  Sodium (VOLTAREN ) 1 % GEL Apply 4 g topically 4 (four) times daily.   escitalopram  (LEXAPRO ) 10 MG tablet TAKE 1 TABLET EVERY DAY   omeprazole  (PRILOSEC) 40 MG capsule Take 1 capsule (40 mg total) by mouth 2 (two) times daily.   topiramate  (TOPAMAX ) 100 MG tablet Take 1 tablet (100 mg total) by mouth 2 (two) times daily.   No facility-administered encounter medications on file as of 12/03/2023.    Allergies (verified) Hctz [hydrochlorothiazide], Morphine , and Zoloft  [sertraline  hcl]   History: Past Medical History:  Diagnosis Date   Anxiety    Chronic pain    Depression    GERD (gastroesophageal reflux disease)    HSP (hereditary spastic paraplegia) (HCC)    Migraines    Perforated peptic ulcer (HCC) 03/17/2018   Goody powder overuse   Past Surgical History:  Procedure Laterality Date   CESAREAN SECTION     CHOLECYSTECTOMY     ENDOMETRIAL ABLATION  2000   GASTRORRHAPHY  03/17/2018   Procedure: GASTRORRHAPHY;  Surgeon: Alanda Allegra,  MD;  Location: [facility];  Service: General;;   HERNIA REPAIR     LAPAROTOMY N/A 03/17/2018   Procedure: EXPLORATORY LAPAROTOMY;  Surgeon: Alanda Allegra, MD;  Location: [facility];  Service: General;  Laterality: N/A;   Family History  Problem Relation Age of Onset   Diabetes Father    Cancer Father        lung cancer   Heart disease Father    Heart disease Maternal Grandmother    Stroke Maternal Grandfather    Diabetes Paternal Grandmother    Colon cancer Neg Hx    Social History   Socioeconomic History   Marital status: Single    Spouse name: Not on file   Number of children: 3   Years of education: 10    Highest education level: 10th grade  Occupational History   Occupation: disabled  Tobacco Use   Smoking status: Former    Current packs/day: 0.00    Average packs/day: 0.3 packs/day for 27.0 years (6.8 ttl pk-yrs)    Types: Cigarettes    Start date: 01/1993    Quit date: 01/2020    Years since quitting: 3.8   Smokeless tobacco: Never  Vaping Use   Vaping status: Every Day   Start date: 01/13/2020  Substance and Sexual Activity   Alcohol use: No   Drug use: No   Sexual activity: Not Currently    Birth control/protection: None  Other Topics Concern   Not on file  Social History Narrative   Lives with daughter and son in law   Social Drivers of Health   Financial Resource Strain: Low Risk  (12/03/2023)   Overall Financial Resource Strain (CARDIA)    Difficulty of Paying Living Expenses: Not hard at all  Food Insecurity: No Food Insecurity (12/03/2023)   Hunger Vital Sign    Worried About Running Out of Food in the Last Year: Never true    Ran Out of Food in the Last Year: Never true  Transportation Needs: No Transportation Needs (12/03/2023)   PRAPARE - Administrator, Civil Service (Medical): No    Lack of Transportation (Non-Medical): No  Physical Activity: Insufficiently Active (12/03/2023)   Exercise Vital Sign    Days of Exercise per Week: 2 days    Minutes of Exercise per Session: 40 min  Stress: No Stress Concern Present (12/03/2023)   [employer] of Occupational Health - Occupational Stress Questionnaire    Feeling of Stress : Not at all  Social Connections: Socially Isolated (12/03/2023)   Social Connection and Isolation Panel [NHANES]    Frequency of Communication with Friends and Family: Once a week    Frequency of Social Gatherings with Friends and Family: Once a week    Attends Religious Services: Never    Database administrator or Organizations: No    Attends Engineer, structural: Never    Marital Status: Never married    Tobacco  Counseling Counseling given: Yes    Clinical Intake:  Pre-visit preparation completed: Yes  Pain : No/denies pain     BMI - recorded: 24.56 Nutritional Status: BMI 25 -29 Overweight Nutritional Risks: None Diabetes: No  No results found for: "HGBA1C"   How often do you need to have someone help you when you read instructions, pamphlets, or other written materials from your doctor or pharmacy?: 1 - Never  Interpreter Needed?: No  Information entered by :: Alia T/cma   Activities of Daily Living  12/03/2023    1:27 PM  In your present state of health, do you have any difficulty performing the following activities:  Hearing? 0  Vision? 0  Difficulty concentrating or making decisions? 0  Walking or climbing stairs? 0  Dressing or bathing? 0  Doing errands, shopping? 1  Comment pt gets assistant from Civil engineer, contracting and eating ? N  Using the Toilet? N  In the past six months, have you accidently leaked urine? N  Do you have problems with loss of bowel control? Y  Managing your Medications? N  Managing your Finances? Y  Comment pt gets assistant from daughter  Housekeeping or managing your Housekeeping? N    Patient Care Team: Tiffany Feller, FNP as PCP - General (Nurse Practitioner) Alyce Jubilee, MD (Inactive) as Consulting Physician (Gastroenterology)  Indicate any recent Medical Services you may have received from other than [facility] providers in the past year (date may be approximate).     Assessment:   This is a routine wellness examination for Tiffany Stewart.  Hearing/Vision screen Hearing Screening - Comments:: Pt denies hearing dif Vision Screening - Comments:: Pt denies vision dif Pt goes [facility] in [place]   Goals Addressed             This Visit's Progress    Patient Stated       Would like to go on vacation to [place]     Prevent falls   On track      Depression Screen     12/03/2023    1:24 PM  06/29/2023   12:09 PM 03/09/2023    2:40 PM 03/09/2023    2:39 PM 12/11/2022    3:32 PM 11/29/2022    3:04 PM 06/09/2022    4:14 PM  PHQ 2/9 Scores  PHQ - 2 Score 0 0 0 0 1 0 0  PHQ- 9 Score 2 0   4  3    Fall Risk     12/03/2023    1:24 PM 06/29/2023   12:09 PM 03/09/2023    2:39 PM 12/11/2022    3:32 PM 11/29/2022    3:00 PM  Fall Risk   Falls in the past year? 1 0 0 1 0  Number falls in past yr: 0  0 0 0  Injury with Fall? 0  0 0 0  Risk for fall due to : Impaired balance/gait;Impaired mobility   History of fall(s) No Fall Risks  Follow up Falls prevention discussed;Falls evaluation completed   Education provided Falls prevention discussed;Education provided;Falls evaluation completed    MEDICARE RISK AT HOME:  Medicare Risk at Home Any stairs in or around the home?: No If so, are there any without handrails?: No Home free of loose throw rugs in walkways, pet beds, electrical cords, etc?: Yes Adequate lighting in your home to reduce risk of falls?: Yes Life alert?: No Use of a cane, walker or w/c?: Yes Grab bars in the bathroom?: Yes Shower chair or bench in shower?: No Elevated toilet seat or a handicapped toilet?: No  TIMED UP AND GO:  Was the test performed?  No  Cognitive Function: 6CIT completed        12/03/2023    1:38 PM 11/29/2022    3:01 PM 10/18/2020    8:29 AM 10/13/2019    8:46 AM  6CIT Screen  What Year? 4 points 0 points 0 points 0 points  What month? 0 points 0 points 0 points 0  points  What time? 3 points 0 points 0 points 0 points  Count back from 20 0 points 0 points 0 points 0 points  Months in reverse 0 points 0 points 0 points 0 points  Repeat phrase 0 points 0 points 0 points 0 points  Total Score 7 points 0 points 0 points 0 points    Immunizations  There is no immunization history on file for this patient.  Screening Tests Health Maintenance  Topic Date Due   Hepatitis C Screening  Never done   DTaP/Tdap/Td (1 - Tdap) Never done    Zoster Vaccines- Shingrix (1 of 2) Never done   Colonoscopy  Never done   COVID-19 Vaccine (1) 12/19/2023 (Originally 11/30/1975)   Cervical Cancer Screening (HPV/Pap Cotest)  06/28/2024 (Originally 12/01/2002)   MAMMOGRAM  06/28/2024 (Originally 12/01/2022)   INFLUENZA VACCINE  03/14/2024   Medicare Annual Wellness (AWV)  12/02/2024   HIV Screening  Completed   HPV VACCINES  Aged Out   Meningococcal B Vaccine  Aged Out    Health Maintenance  Health Maintenance Due  Topic Date Due   Hepatitis C Screening  Never done   DTaP/Tdap/Td (1 - Tdap) Never done   Zoster Vaccines- Shingrix (1 of 2) Never done   Colonoscopy  Never done   Health Maintenance Items Addressed: See Nurse Notes  Additional Screening:  Vision Screening: Recommended annual ophthalmology exams for early detection of glaucoma and other disorders of the eye.  Dental Screening: Recommended annual dental exams for proper oral hygiene  Community Resource Referral / Chronic Care Management: CRR required this visit?  No   CCM required this visit?  No     Plan:     I have personally reviewed and noted the following in the patient's chart:   Medical and social history Use of alcohol, tobacco or illicit drugs  Current medications and supplements including opioid prescriptions. Patient is not currently taking opioid prescriptions. Functional ability and status Nutritional status Physical activity Advanced directives List of other physicians Hospitalizations, surgeries, and ER visits in previous 12 months Vitals Screenings to include cognitive, depression, and falls Referrals and appointments  In addition, I have reviewed and discussed with patient certain preventive protocols, quality metrics, and best practice recommendations. A written personalized care plan for preventive services as well as general preventive health recommendations were provided to patient.   I have reviewed and agree with the above AWV  documentation.   Mary-Margaret Gaylyn Keas, FNP    Michaelle Adolphus, [facility]   12/03/2023   After Visit Summary: (Declined) Due to this being a telephonic visit, with patients personalized plan was offered to patient but patient Declined AVS at this time   Notes: Please discuss with your provider about getting your vaccines update/orders for colonoscopy & lab order at your next visit 12/28/23 @11 :45am.

## 2023-12-03 NOTE — Patient Instructions (Addendum)
 [name] , Thank you for taking time to come for your Medicare Wellness Visit. I appreciate your ongoing commitment to your health goals. Please review the following plan we discussed and let me know if I can assist you in the future.   Referrals/Orders/Follow-Ups/Clinician Recommendations: Please discuss with your provider about getting your vaccines update/orders for colonoscopy & lab order at your next visit 12/28/23 @11 :45am.   This is a list of the screening recommended for you and due dates:  Health Maintenance  Topic Date Due   Hepatitis C Screening  Never done   DTaP/Tdap/Td vaccine (1 - Tdap) Never done   Zoster (Shingles) Vaccine (1 of 2) Never done   Colon Cancer Screening  Never done   Medicare Annual Wellness Visit  11/29/2023   COVID-19 Vaccine (1) 12/19/2023*   Pap with HPV screening  06/28/2024*   Mammogram  06/28/2024*   Flu Shot  03/14/2024   HIV Screening  Completed   HPV Vaccine  Aged Out   Meningitis B Vaccine  Aged Out  *Topic was postponed. The date shown is not the original due date.    Advanced directives: (Declined) Advance directive discussed with you today. Even though you declined this today, please call our office should you change your mind, and we can give you the proper paperwork for you to fill out.  Next Medicare Annual Wellness Visit scheduled for next year: Yes

## 2023-12-21 ENCOUNTER — Encounter (HOSPITAL_COMMUNITY): Payer: Self-pay

## 2023-12-25 ENCOUNTER — Ambulatory Visit: Payer: Medicare HMO | Admitting: Nurse Practitioner

## 2023-12-28 ENCOUNTER — Ambulatory Visit: Payer: Medicare HMO | Admitting: Nurse Practitioner

## 2023-12-28 ENCOUNTER — Telehealth: Payer: Self-pay

## 2023-12-28 ENCOUNTER — Ambulatory Visit: Payer: Self-pay

## 2023-12-28 NOTE — Telephone Encounter (Signed)
 Left message on patients voicemail that she would ntbs before meds can be refilled. Advised her to contact the office and we would get her scheduled

## 2023-12-28 NOTE — Telephone Encounter (Signed)
 It looks like pt no showed her chronic follow up appt with you today at 11:45. Ok to refill diclofenac  or ntbs?

## 2023-12-28 NOTE — Telephone Encounter (Signed)
  Chief Complaint: both ankles and feet over a month Symptoms: leg pain (knees and leg pain 9/10 Frequency: over a month  Pertinent Negatives: Patient denies fever, CP, SOB. redness Disposition: [] ED /[] Urgent Care (no appt availability in office) / [] Appointment(In office/virtual)/ []  [facility]/ [] Home Care/ [] Refused Recommended Disposition /[]  Mobile Bus/ []  Follow-up with PCP Additional Notes: wanting refill of Diclofenac  po dose. Copied from CRM [id number]. Topic: Clinical - Red Word Triage >> Dec 28, 2023  1:51 PM Brittney F wrote: [facility] that prompted transfer to Nurse Triage:   Swelling in ankle and legs; swelling has been going on for a while and has not received her prescription refill for over 3 months Reason for Disposition  MILD or MODERATE ankle swelling (e.g., can't move joint normally, can't do usual activities) (Exceptions: Itchy, localized swelling; swelling is chronic.)  Answer Assessment - Initial Assessment Questions 1. LOCATION: "Which ankle is swollen?" "Where is the swelling?"     Both  2. ONSET: "When did the swelling start?"     More than a month  3. SWELLING: "How bad is the swelling?" Or, "How large is it?" (e.g., mild, moderate, severe; size of localized swelling)    - NONE: No joint swelling.   - LOCALIZED: Localized; small area of puffy or swollen skin (e.g., insect bite, skin irritation).   - MILD: Joint looks or feels mildly swollen or puffy.   - MODERATE: Swollen; interferes with normal activities (e.g., work or school); decreased range of movement; may be limping.   - SEVERE: Very swollen; can't move swollen joint at all; limping a lot or unable to walk.     moderate 4. PAIN: "Is there any pain?" If Yes, ask: "How bad is it?" (Scale 1-10; or mild, moderate, severe)   - NONE (0): no pain.   - MILD (1-3): doesn't interfere with normal activities.    - MODERATE (4-7): interferes with normal activities (e.g., work or school) or  awakens from sleep, limping.    - SEVERE (8-10): excruciating pain, unable to do any normal activities, unable to walk.      Knees, leg pain:9-10 5. CAUSE: "What do you think caused the ankle swelling?"     Ankle brace 6. OTHER SYMPTOMS: "Do you have any other symptoms?" (e.g., fever, chest pain, difficulty breathing, calf pain)     Leg pain  Protocols used: Ankle Swelling-A-AH

## 2023-12-28 NOTE — Telephone Encounter (Signed)
 Will NTBS since missed appointment

## 2023-12-28 NOTE — Telephone Encounter (Signed)
 Pt is requesting a reorder of po Diclofenac . Pt having knee and leg pain and stated it is the only thing that helps.   Dicofenac 75 mg EC tablet ordered 11/02/22 and was dc'd 11/02/23 "completed course" Pt is going to call back about ankle swelling.

## 2023-12-31 ENCOUNTER — Encounter: Payer: Self-pay | Admitting: Nurse Practitioner

## 2024-01-16 ENCOUNTER — Other Ambulatory Visit: Payer: Self-pay | Admitting: Nurse Practitioner

## 2024-01-16 ENCOUNTER — Ambulatory Visit: Admitting: Family Medicine

## 2024-01-16 ENCOUNTER — Telehealth: Payer: Self-pay | Admitting: Nurse Practitioner

## 2024-01-16 ENCOUNTER — Ambulatory Visit: Payer: Self-pay

## 2024-01-16 DIAGNOSIS — G43101 Migraine with aura, not intractable, with status migrainosus: Secondary | ICD-10-CM

## 2024-01-16 DIAGNOSIS — K581 Irritable bowel syndrome with constipation: Secondary | ICD-10-CM

## 2024-01-16 DIAGNOSIS — K219 Gastro-esophageal reflux disease without esophagitis: Secondary | ICD-10-CM

## 2024-01-16 NOTE — Telephone Encounter (Signed)
 FYI Only or Action Required?: FYI only for provider  Patient was last seen in primary care on 06/29/2023 by Delfina Feller, FNP. Called Nurse Triage reporting Foot Swelling. Symptoms began ongoing. Interventions attempted: Other: patient is out of meds that manage swelling. Symptoms are: unchanged.  Triage Disposition: See PCP When Office is Open (Within 3 Days) virtual appointment scheduled for today   Patient/caregiver understands and will follow disposition?: Yes            Copied from CRM [id number]. Topic: Clinical - Red Word Triage >> Jan 16, 2024 12:40 PM Carla L wrote: Red Word that prompted transfer to Nurse Triage: Swelling in ankle and legs and pain - has been out of medication to assist w/ symptoms and pt unable to come in. Requesting virtual appointment. Reason for Disposition  MILD or MODERATE ankle swelling (e.g., can't move joint normally, can't do usual activities) (Exceptions: Itchy, localized swelling; swelling is chronic.)  Answer Assessment - Initial Assessment Questions 1. LOCATION: "Which ankle is swollen?" "Where is the swelling?"     Bilateral feet and ankle 2. ONSET: "When did the swelling start?"    "Same as always due to leg condition" 3. SWELLING: "How bad is the swelling?" Or, "How large is it?" (e.g., mild, moderate, severe; size of localized swelling)    - NONE: No joint swelling.   - LOCALIZED: Localized; small area of puffy or swollen skin (e.g., insect bite, skin irritation).   - MILD: Joint looks or feels mildly swollen or puffy.   - MODERATE: Swollen; interferes with normal activities (e.g., work or school); decreased range of movement; may be limping.   - SEVERE: Very swollen; can't move swollen joint at all; limping a lot or unable to walk.     "Not severe, just where I can't get my shoes on my feet" 4. PAIN: "Is there any pain?" If Yes, ask: "How bad is it?" (Scale 1-10; or mild, moderate, severe)   - NONE (0): no pain.   - MILD (1-3):  doesn't interfere with normal activities.    - MODERATE (4-7): interferes with normal activities (e.g., work or school) or awakens from sleep, limping.    - SEVERE (8-10): excruciating pain, unable to do any normal activities, unable to walk.      Leg pain 5. CAUSE: "What do you think caused the ankle swelling?"     "Leg condition" 6. OTHER SYMPTOMS: "Do you have any other symptoms?" (e.g., fever, chest pain, difficulty breathing, calf pain)     States that BP and all VS are fine  Protocols used: Ankle Swelling-A-AH

## 2024-01-16 NOTE — Telephone Encounter (Signed)
 Patient is on DOD schedule today for virtual visit.

## 2024-01-16 NOTE — Telephone Encounter (Signed)
    Prescription Request  01/16/2024   What is the name of the medication or equipment? XANAX , BACLOFEN , OMEPRAZOLE , TOPAMAX   Have you contacted your pharmacy to request a refill? YES  Which pharmacy would you like this sent to? [facility]   Patient was scheduled to have a video visit with Jacqualyn Mates today for swelling and to get her medicine refilled (pcp out of office until next week).   Connell Degree tried to contact patient several times for video visit, but patient was unsuccessful in being able to do a video visit.   Patient is scheduled to see PCP on Monday (01/21/24) and needs enough medicine sent in to last her until this appt.

## 2024-01-21 ENCOUNTER — Encounter: Payer: Self-pay | Admitting: Nurse Practitioner

## 2024-01-21 ENCOUNTER — Ambulatory Visit (INDEPENDENT_AMBULATORY_CARE_PROVIDER_SITE_OTHER): Admitting: Nurse Practitioner

## 2024-01-21 VITALS — BP 114/81 | HR 74 | Temp 98.0°F

## 2024-01-21 DIAGNOSIS — K219 Gastro-esophageal reflux disease without esophagitis: Secondary | ICD-10-CM

## 2024-01-21 DIAGNOSIS — R600 Localized edema: Secondary | ICD-10-CM | POA: Diagnosis not present

## 2024-01-21 DIAGNOSIS — G43101 Migraine with aura, not intractable, with status migrainosus: Secondary | ICD-10-CM | POA: Diagnosis not present

## 2024-01-21 DIAGNOSIS — G114 Hereditary spastic paraplegia: Secondary | ICD-10-CM

## 2024-01-21 DIAGNOSIS — D509 Iron deficiency anemia, unspecified: Secondary | ICD-10-CM | POA: Diagnosis not present

## 2024-01-21 DIAGNOSIS — F17200 Nicotine dependence, unspecified, uncomplicated: Secondary | ICD-10-CM

## 2024-01-21 DIAGNOSIS — K581 Irritable bowel syndrome with constipation: Secondary | ICD-10-CM | POA: Diagnosis not present

## 2024-01-21 DIAGNOSIS — F411 Generalized anxiety disorder: Secondary | ICD-10-CM

## 2024-01-21 DIAGNOSIS — Z8719 Personal history of other diseases of the digestive system: Secondary | ICD-10-CM | POA: Insufficient documentation

## 2024-01-21 MED ORDER — OMEPRAZOLE 40 MG PO CPDR: 40.0000 mg | DELAYED_RELEASE_CAPSULE | Freq: Every day | ORAL | 1 refills | Status: DC

## 2024-01-21 MED ORDER — ALPRAZOLAM 0.5 MG PO TABS: 0.5000 mg | ORAL_TABLET | Freq: Two times a day (BID) | ORAL | 5 refills | Status: AC | PRN

## 2024-01-21 MED ORDER — TOPIRAMATE 100 MG PO TABS: 100.0000 mg | ORAL_TABLET | Freq: Two times a day (BID) | ORAL | 1 refills | Status: DC

## 2024-01-21 MED ORDER — FUROSEMIDE 20 MG PO TABS: 20.0000 mg | ORAL_TABLET | Freq: Every day | ORAL | 3 refills | Status: DC

## 2024-01-21 MED ORDER — BACLOFEN 10 MG PO TABS: 10.0000 mg | ORAL_TABLET | Freq: Two times a day (BID) | ORAL | 1 refills | Status: DC

## 2024-01-21 NOTE — Progress Notes (Signed)
 Subjective:    Patient ID: Tiffany Stewart, female    DOB: 04/12/1971, 53 y.o.   MRN: [id number]   Chief Complaint: medical management of chronic issues     HPI:  Tiffany Stewart is a 53 y.o. who identifies as a female who was assigned female at birth.   Social history: Lives with: daughter Work history: disability   Comes in today for follow up of the following chronic medical issues:  1. Gastroesophageal reflux disease without esophagitis Is on omeprazole  daily and is doing well.  2. Irritable bowel syndrome with constipation Alternates from constipation to diarrhea. She knows what to do for it.   3. History of duodenal ulcer (HCC) This was several years ago when she was taking a lot of ibuprofen   4. HSP (hereditary spastic paraplegia) (HCC) Is in a wheel chair all the time. Can no longer stand up at all.   5. Anxiety state Is on xanx BID    01/21/2024    3:26 PM 06/29/2023   12:10 PM 03/09/2023    2:40 PM 12/11/2022    3:33 PM  GAD 7 : Generalized Anxiety Score  Nervous, Anxious, on Edge 2 0 0 1  Control/stop worrying 3 0 0 1  Worry too much - different things 2 0 2 1  Trouble relaxing 0 0  1  Restless 0 0  0  Easily annoyed or irritable 0 0 0 1  Afraid - awful might happen 2 0 0 1  Total GAD 7 Score 9 0  6  Anxiety Difficulty Not difficult at all Not difficult at all Not difficult at all Not difficult at all     6. Iron deficiency anemia, unspecified iron deficiency anemia type No c/o fatigue Lab Results  Component Value Date   HGB 12.0 06/29/2023     7. TOBACCO ABUSE Smokes over a pack a day- has been vaping for a year instead of smoking. Has never had low dose ct scan will discuss with patient.   New complaints: Bil ankle swelling- doe snot prop feet up at all when sitting.  Allergies  Allergen Reactions   Hctz [Hydrochlorothiazide] Diarrhea   Morphine  Other (See Comments)    Flushing, turns skin red    Zoloft  [Sertraline  Hcl] Other (See Comments)     Spaced out   Outpatient Encounter Medications as of 01/21/2024  Medication Sig   acetaminophen  (TYLENOL ) 500 MG tablet Take 1,500 mg by mouth 2 (two) times daily as needed for headache.    ALPRAZolam  (XANAX ) 0.5 MG tablet Take 1 tablet (0.5 mg total) by mouth 2 (two) times daily as needed. for anxiety   baclofen  (LIORESAL ) 10 MG tablet take 1 tablet (10 MILLIGRAM total) by mouth 2 (two) times daily.   diclofenac  Sodium (VOLTAREN ) 1 % GEL Apply 4 g topically 4 (four) times daily.   escitalopram  (LEXAPRO ) 10 MG tablet TAKE 1 TABLET EVERY DAY   omeprazole  (PRILOSEC) 40 MG capsule take 1 capsule (40 MILLIGRAM total) by mouth 2 (two) times daily.   topiramate  (TOPAMAX ) 100 MG tablet take 1 tablet (100 MILLIGRAM total) by mouth 2 (two) times daily.   No facility-administered encounter medications on file as of 01/21/2024.    Past Surgical History:  Procedure Laterality Date   CESAREAN SECTION     CHOLECYSTECTOMY     ENDOMETRIAL ABLATION  2000   GASTRORRHAPHY  03/17/2018   Procedure: GASTRORRHAPHY;  Surgeon: Alanda Allegra, MD;  Location: [facility];  Service: General;;  HERNIA REPAIR     LAPAROTOMY N/A 03/17/2018   Procedure: EXPLORATORY LAPAROTOMY;  Surgeon: Alanda Allegra, MD;  Location: [facility];  Service: General;  Laterality: N/A;    Family History  Problem Relation Age of Onset   Diabetes Father    Cancer Father        lung cancer   Heart disease Father    Heart disease Maternal Grandmother    Stroke Maternal Grandfather    Diabetes Paternal Grandmother    Colon cancer Neg Hx       Controlled substance contract: n/a     Review of Systems  Constitutional:  Negative for diaphoresis.  Eyes:  Negative for pain.  Respiratory:  Negative for shortness of breath.   Cardiovascular:  Negative for chest pain, palpitations and leg swelling.  Gastrointestinal:  Negative for abdominal pain.  Endocrine: Negative for polydipsia.  Skin:  Negative for rash.  Neurological:  Negative for  dizziness, weakness and headaches.  Hematological:  Does not bruise/bleed easily.  All other systems reviewed and are negative.      Objective:   Physical Exam Vitals and nursing note reviewed.  Constitutional:      General: She is not in acute distress.    Appearance: Normal appearance. She is well-developed.  HENT:     Head: Normocephalic.     Right Ear: Tympanic membrane normal.     Left Ear: Tympanic membrane normal.     Nose: Nose normal.     Mouth/Throat:     Mouth: Mucous membranes are moist.  Eyes:     Pupils: Pupils are equal, round, and reactive to light.  Neck:     Vascular: No carotid bruit or JVD.  Cardiovascular:     Rate and Rhythm: Normal rate and regular rhythm.     Heart sounds: Normal heart sounds.  Pulmonary:     Effort: Pulmonary effort is normal. No respiratory distress.     Breath sounds: Normal breath sounds. No wheezing or rales.  Chest:     Chest wall: No tenderness.  Abdominal:     General: Bowel sounds are normal. There is no distension or abdominal bruit.     Palpations: Abdomen is soft. There is no hepatomegaly, splenomegaly, mass or pulsatile mass.     Tenderness: There is no abdominal tenderness.  Musculoskeletal:        General: Normal range of motion.     Cervical back: Normal range of motion and neck supple.     Comments: Stays in wheel chair most all the time.   Lymphadenopathy:     Cervical: No cervical adenopathy.  Skin:    General: Skin is warm and dry.  Neurological:     Mental Status: She is alert and oriented to person, place, and time.     Deep Tendon Reflexes: Reflexes are normal and symmetric.  Psychiatric:        Behavior: Behavior normal.        Thought Content: Thought content normal.        Judgment: Judgment normal.     BP 114/81   Pulse 74   Temp 98 F (36.7 C) (Temporal)   SpO2 96%          Assessment & Plan:  Tiffany Stewart comes in today with chief complaint of medical management of chronic issues     Diagnosis and orders addressed:  1. Gastroesophageal reflux disease without esophagitis Avoid spicy foods Do not eat 2 hours prior to bedtime  -  omeprazole  (PRILOSEC) 40 MG capsule; Take 1 capsule (40 mg total) by mouth 2 (two) times daily.  Dispense: 180 capsule; Refill: 1  2. Irritable bowel syndrome with constipation Watch diet to prevent extremes in bowel habits - CBC with Differential/Platelet - Lipid panel - CMP14+EGFR - baclofen  (LIORESAL ) 10 MG tablet; Take 1 tablet (10 mg total) by mouth 2 (two) times daily.  Dispense: 180 tablet; Refill: 1  3. Perforated peptic ulcer (HCC) Avoid motrin   4. HSP (hereditary spastic paraplegia) (HCC) Cannot stand  Needs to see biotech for leg  5. Anxiety state Stress management - Drug Screen 10 W/Conf, Se - ALPRAZolam  (XANAX ) 0.5 MG tablet; Take 1 tablet (0.5 mg total) by mouth 2 (two) times daily as needed. for anxiety  Dispense: 60 tablet; Refill: 5 - escitalopram  (LEXAPRO ) 10 MG tablet; Take 1 tablet (10 mg total) by mouth daily.  Dispense: 90 tablet; Refill: 1  6. Iron deficiency anemia, unspecified iron deficiency anemia type Labs pending  7. TOBACCO ABUSE Smoking cessation encouraged  8. Migraine with aura and with status migrainosus, not intractable Avoid caffeine - topiramate  (TOPAMAX ) 50 MG tablet; Take 1 tablet (50 mg total) by mouth 2 (two) times daily.  Dispense: 180 tablet; Refill: 1   Labs pending Health Maintenance reviewed Diet and exercise encouraged  Follow up plan: 6 months   Mary-Margaret Gaylyn Keas, FNP

## 2024-04-03 DIAGNOSIS — S3981XA Other specified injuries of abdomen, initial encounter: Secondary | ICD-10-CM | POA: Diagnosis not present

## 2024-04-03 DIAGNOSIS — L03115 Cellulitis of right lower limb: Secondary | ICD-10-CM | POA: Diagnosis not present

## 2024-04-03 DIAGNOSIS — E876 Hypokalemia: Secondary | ICD-10-CM | POA: Diagnosis not present

## 2024-04-03 DIAGNOSIS — R109 Unspecified abdominal pain: Secondary | ICD-10-CM | POA: Diagnosis not present

## 2024-04-03 DIAGNOSIS — L03119 Cellulitis of unspecified part of limb: Secondary | ICD-10-CM | POA: Diagnosis not present

## 2024-04-03 DIAGNOSIS — Z885 Allergy status to narcotic agent status: Secondary | ICD-10-CM | POA: Diagnosis not present

## 2024-04-03 DIAGNOSIS — R531 Weakness: Secondary | ICD-10-CM | POA: Diagnosis not present

## 2024-04-03 DIAGNOSIS — M25562 Pain in left knee: Secondary | ICD-10-CM | POA: Diagnosis not present

## 2024-04-03 DIAGNOSIS — L03116 Cellulitis of left lower limb: Secondary | ICD-10-CM | POA: Diagnosis not present

## 2024-04-03 DIAGNOSIS — M170 Bilateral primary osteoarthritis of knee: Secondary | ICD-10-CM | POA: Diagnosis not present

## 2024-04-03 DIAGNOSIS — S3983XA Other specified injuries of pelvis, initial encounter: Secondary | ICD-10-CM | POA: Diagnosis not present

## 2024-04-03 DIAGNOSIS — M25561 Pain in right knee: Secondary | ICD-10-CM | POA: Diagnosis not present

## 2024-04-03 DIAGNOSIS — N200 Calculus of kidney: Secondary | ICD-10-CM | POA: Diagnosis not present

## 2024-04-08 ENCOUNTER — Encounter: Payer: Self-pay | Admitting: Family

## 2024-04-08 ENCOUNTER — Ambulatory Visit: Admitting: Family

## 2024-04-08 VITALS — BP 112/83 | HR 68 | Temp 97.6°F | Ht 61.0 in

## 2024-04-08 DIAGNOSIS — G114 Hereditary spastic paraplegia: Secondary | ICD-10-CM | POA: Diagnosis not present

## 2024-04-08 DIAGNOSIS — K219 Gastro-esophageal reflux disease without esophagitis: Secondary | ICD-10-CM

## 2024-04-08 DIAGNOSIS — R531 Weakness: Secondary | ICD-10-CM | POA: Diagnosis not present

## 2024-04-08 DIAGNOSIS — M170 Bilateral primary osteoarthritis of knee: Secondary | ICD-10-CM | POA: Diagnosis not present

## 2024-04-08 DIAGNOSIS — Z09 Encounter for follow-up examination after completed treatment for conditions other than malignant neoplasm: Secondary | ICD-10-CM

## 2024-04-08 DIAGNOSIS — L03116 Cellulitis of left lower limb: Secondary | ICD-10-CM | POA: Diagnosis not present

## 2024-04-08 MED ORDER — ACETAMINOPHEN 500 MG PO TABS: 1000.0000 mg | ORAL_TABLET | Freq: Three times a day (TID) | ORAL | 2 refills | Status: AC | PRN

## 2024-04-08 NOTE — Progress Notes (Signed)
 Subjective:    Patient ID: Tiffany Stewart, female    DOB: Jan 06, 1971, 53 y.o.   MRN: [id number]  Chief Complaint  Patient presents with   Hospitalization Follow-up   Pt presents to the office today for hospital follow up. She went to the ED on 04/03/24 with generalized weakness, flank pain, and bilateral knee pain.  She has hereditary spastic paraplegia.   She had a CT abdomen that showed, 1.    No acute abnormality in the abdomen or pelvis.  2.    Nonobstructing 5 mm left intrarenal stone.  3.    Left thigh posterolateral proximal soft tissue inflammation. Correlate clinically for cellulitis.   It was noted to have some slightly erythemas and swelling of posterior left lateral thigh. She was given Keflex QID for 7 days for possible cellulitis.   Reports the erythemas is slightly better. Continues to have bilateral knee pain that is worse in left. Her x-ray of bilateral knees showed, Bilateral knee osteoarthrosis, left greater than right. Knee Pain  The incident occurred more than 1 week ago. There was no injury mechanism. The pain is present in the left knee and right knee. The pain is at a severity of 9/10. The pain is moderate. The pain has been Constant since onset. Associated symptoms include muscle weakness. She reports no foreign bodies present. The symptoms are aggravated by movement. She has tried acetaminophen  for the symptoms. The treatment provided mild relief.      Review of Systems  All other systems reviewed and are negative.   Social History   Socioeconomic History   Marital status: Single    Spouse name: Not on file   Number of children: 3   Years of education: 10   Highest education level: 10th grade  Occupational History   Occupation: disabled  Tobacco Use   Smoking status: Former    Current packs/day: 0.00    Average packs/day: 0.3 packs/day for 27.0 years (6.8 ttl pk-yrs)    Types: Cigarettes    Start date: 01/1993    Quit date: 01/2020    Years  since quitting: 4.2   Smokeless tobacco: Never  Vaping Use   Vaping status: Every Day   Start date: 01/13/2020  Substance and Sexual Activity   Alcohol use: No   Drug use: No   Sexual activity: Not Currently    Birth control/protection: None  Other Topics Concern   Not on file  Social History Narrative   Lives with daughter and son in law   Social Drivers of Health   Financial Resource Strain: Low Risk  (12/03/2023)   Overall Financial Resource Strain (CARDIA)    Difficulty of Paying Living Expenses: Not hard at all  Food Insecurity: No Food Insecurity (12/03/2023)   Hunger Vital Sign    Worried About Running Out of Food in the Last Year: Never true    Ran Out of Food in the Last Year: Never true  Transportation Needs: No Transportation Needs (12/03/2023)   PRAPARE - Administrator, Civil Service (Medical): No    Lack of Transportation (Non-Medical): No  Physical Activity: Insufficiently Active (12/03/2023)   Exercise Vital Sign    Days of Exercise per Week: 2 days    Minutes of Exercise per Session: 40 min  Stress: No Stress Concern Present (12/03/2023)   [employer] of Occupational Health - Occupational Stress Questionnaire    Feeling of Stress : Not at all  Social Connections: Socially Isolated (12/03/2023)  Social Advertising account executive    Frequency of Communication with Friends and Family: Once a week    Frequency of Social Gatherings with Friends and Family: Once a week    Attends Religious Services: Never    Database administrator or Organizations: No    Attends Engineer, structural: Never    Marital Status: Never married   Family History  Problem Relation Age of Onset   Diabetes Father    Cancer Father        lung cancer   Heart disease Father    Heart disease Maternal Grandmother    Stroke Maternal Grandfather    Diabetes Paternal Grandmother    Colon cancer Neg Hx         Objective:   Physical Exam Vitals reviewed.   Constitutional:      General: She is not in acute distress.    Appearance: She is well-developed.  HENT:     Head: Normocephalic and atraumatic.  Eyes:     Pupils: Pupils are equal, round, and reactive to light.  Neck:     Thyroid : No thyromegaly.  Cardiovascular:     Rate and Rhythm: Normal rate and regular rhythm.     Heart sounds: Normal heart sounds. No murmur heard. Pulmonary:     Effort: Pulmonary effort is normal. No respiratory distress.     Breath sounds: Normal breath sounds. No wheezing.  Abdominal:     General: Bowel sounds are normal. There is no distension.     Palpations: Abdomen is soft.     Tenderness: There is no abdominal tenderness.  Musculoskeletal:        General: Deformity (bilateral feet turned inward) present. No swelling.     Cervical back: Normal range of motion and neck supple.  Skin:    General: Skin is warm and dry.  Neurological:     Mental Status: She is alert and oriented to person, place, and time.     Cranial Nerves: No cranial nerve deficit.     Motor: Weakness present.     Coordination: Coordination abnormal.     Gait: Gait abnormal.     Deep Tendon Reflexes: Reflexes are normal and symmetric.     Comments: Paraplegia, wheelchair bound.   Psychiatric:        Behavior: Behavior normal.        Thought Content: Thought content normal.        Judgment: Judgment normal.       BP 112/83   Pulse 68   Temp 97.6 F (36.4 C)   Ht 5' 1 (1.549 m)   BMI 24.56 kg/m      Assessment & Plan:  Tiffany Stewart comes in today with chief complaint of Hospitalization Follow-up   Diagnosis and orders addressed:  1. Cellulitis of left lower extremity (Primary) No redness or swelling noted on exam Continue Keflex QID Elevated when possible   2. Hospital discharge follow-up Hospital notes reviewed   3. Primary osteoarthritis of both knees Tylenol  as needed for pain  ROM exercises encouraged - acetaminophen  (TYLENOL ) 500 MG tablet; Take 2  tablets (1,000 mg total) by mouth every 8 (eight) hours as needed for headache.  Dispense: 90 tablet; Refill: 2  4. HSP (hereditary spastic paraplegia) (HCC)   5. Generalized weakness   Hospital notes reviewed  Tylenol  as needed Keep chronic follow up with PCP Pt requesting for a nurse to help with daily living. Currently living with daughter, but  daughter is returning to work in the next several weeks.     Bari Learn, FNP

## 2024-04-08 NOTE — Patient Instructions (Signed)

## 2024-04-11 ENCOUNTER — Telehealth: Payer: Self-pay | Admitting: Family Medicine

## 2024-04-11 ENCOUNTER — Other Ambulatory Visit: Payer: Self-pay

## 2024-04-11 DIAGNOSIS — K219 Gastro-esophageal reflux disease without esophagitis: Secondary | ICD-10-CM

## 2024-04-11 MED ORDER — OMEPRAZOLE 20 MG PO CPDR: 40.0000 mg | DELAYED_RELEASE_CAPSULE | Freq: Two times a day (BID) | ORAL | 3 refills | Status: AC

## 2024-04-11 NOTE — Telephone Encounter (Signed)
 Called and spoke with patient and she stated that insurance will not pay for her omeprazole  40 BID. She wants med changed to 20mg  BID. New rx sent to [facility]. Patient verbalized understanding

## 2024-04-11 NOTE — Telephone Encounter (Signed)
 Copied from CRM #[id number]. Topic: Clinical - Medication Question >> Apr 11, 2024  3:45 PM Tiffany Stewart wrote: Reason for CRM: Patient would like a call back from the nurse, has some questions regarding her omeprazole  (PRILOSEC) 40 MG capsule. Ty # [phone]

## 2024-05-19 ENCOUNTER — Ambulatory Visit: Payer: Self-pay

## 2024-05-19 ENCOUNTER — Other Ambulatory Visit: Payer: Self-pay

## 2024-05-19 DIAGNOSIS — R600 Localized edema: Secondary | ICD-10-CM

## 2024-05-19 MED ORDER — FUROSEMIDE 20 MG PO TABS: 20.0000 mg | ORAL_TABLET | Freq: Every day | ORAL | 3 refills | Status: DC

## 2024-05-19 MED ORDER — FUROSEMIDE 20 MG PO TABS: 20.0000 mg | ORAL_TABLET | Freq: Every day | ORAL | 3 refills | Status: AC

## 2024-05-19 NOTE — Telephone Encounter (Signed)
 Copied from CRM #[id number]. Topic: Clinical - Medication Question >> May 19, 2024 11:33 AM Tiffany Stewart wrote: Reason for CRM: patient is calling in regards to the medications she is taking. She doesn't not know why she is taking most of these medications. [phone].  Call placed to patient-no answer-voicemail cut off message without being able to leave a message twice. Will place in call backs.

## 2024-05-19 NOTE — Telephone Encounter (Signed)
 This RN attempted to contact patient, 3rd attempt, left voicemail message. Will route HP to clinic.

## 2024-05-19 NOTE — Telephone Encounter (Signed)
 Second attempt: LVM for patient to return call to [phone]   Copied from CRM #[id number]. Topic: Clinical - Medication Question >> May 19, 2024 11:33 AM Tiffany Stewart wrote: Reason for CRM: patient is calling in regards to the medications she is taking. She doesn't not know why she is taking most of these medications. [phone].

## 2024-05-19 NOTE — Telephone Encounter (Signed)
 FYI Only or Action Required?: Action required by provider: medication refill request and see reason for dispo notes.  Patient was last seen in primary care on 04/08/2024 by Lavell Bari LABOR, FNP.  Called Nurse Triage reporting Medication Problem.   Triage Disposition: Call PCP Now  Patient/caregiver understands and will follow disposition?: Yes         Reason for Disposition  [1] Prescription not at pharmacy AND [2] was prescribed by doctor (or NP/PA) recently  (Exception: [facility] has access to EMR and prescription is recorded there. Go to Home Care and confirm for pharmacy.)    Pt reports taking last white pill today and attempted to call pharmacy to refill, but did not know the name so was instructed to reach out to PCP office.   Upon further investigation, pt thinks it is the water pill. Pt endorses a small, white oblong shaped pill. [facility] will forward encounter for Gladis PIETY 's office to review and advise/refill. Patient verbalized understanding and endorses pharmacy on file is correct.  Answer Assessment - Initial Assessment Questions 1. NAME of MEDICINE: What medicine(s) are you calling about?     Unknown name - thinks water pill Small, oval-shaped real tiny 2. QUESTION: What is your question? (e.g., double dose of medicine, side effect)     Needs refill. Attempted to call pharmacy to assist, but was unable to help d/t not having refill available and pt was unable to identify name. 3. PRESCRIBER: Who prescribed the medicine? Reason: if prescribed by specialist, call should be referred to that group.     PCP 4. SYMPTOMS: Do you have any symptoms? If Yes, ask: What symptoms are you having?  How bad are the symptoms (e.g., mild, moderate, severe)     Chronic swelling 5. PREGNANCY:  Is there any chance that you are pregnant? When was your last menstrual period?     N/a  Protocols used: Medication Question Call-A-AH

## 2024-05-19 NOTE — Addendum Note (Signed)
 Addended by: VIKTORIA ALAN MATSU on: 05/19/2024 03:09 PM   Modules accepted: Orders

## 2024-05-19 NOTE — Telephone Encounter (Signed)
 Sent refill for furosemide  to [facility]. Left message for patient that this was done

## 2024-06-13 DIAGNOSIS — Z1211 Encounter for screening for malignant neoplasm of colon: Secondary | ICD-10-CM | POA: Diagnosis not present

## 2024-06-13 DIAGNOSIS — Z87891 Personal history of nicotine dependence: Secondary | ICD-10-CM | POA: Diagnosis not present

## 2024-06-13 DIAGNOSIS — R0781 Pleurodynia: Secondary | ICD-10-CM | POA: Diagnosis not present

## 2024-06-13 DIAGNOSIS — Z885 Allergy status to narcotic agent status: Secondary | ICD-10-CM | POA: Diagnosis not present

## 2024-06-14 DIAGNOSIS — R0781 Pleurodynia: Secondary | ICD-10-CM | POA: Diagnosis not present

## 2024-06-21 LAB — COLOGUARD
COLOGUARD: NEGATIVE
Cologuard: NEGATIVE

## 2024-06-24 ENCOUNTER — Ambulatory Visit: Payer: Self-pay | Admitting: Nurse Practitioner

## 2024-07-25 ENCOUNTER — Other Ambulatory Visit: Payer: Self-pay | Admitting: Nurse Practitioner

## 2024-07-25 DIAGNOSIS — F411 Generalized anxiety disorder: Secondary | ICD-10-CM

## 2024-08-25 ENCOUNTER — Other Ambulatory Visit: Payer: Self-pay | Admitting: Nurse Practitioner

## 2024-08-25 DIAGNOSIS — K581 Irritable bowel syndrome with constipation: Secondary | ICD-10-CM

## 2024-08-25 DIAGNOSIS — G43101 Migraine with aura, not intractable, with status migrainosus: Secondary | ICD-10-CM

## 2024-08-25 NOTE — Telephone Encounter (Unsigned)
 Copied from CRM [id number]. Topic: Clinical - Medication Refill >> Aug 25, 2024  9:52 AM Diannia H wrote: Medication: baclofen  (LIORESAL ) 10 MG tablet  Has the patient contacted their pharmacy? Yes (Agent: If no, request that the patient contact the pharmacy for the refill. If patient does not wish to contact the pharmacy document the reason why and proceed with request.) (Agent: If yes, when and what did the pharmacy advise?)  This is the patient's preferred pharmacy:  [facility] - [place], [place] - [address] [place] [address] Phone: [phone] Fax: [phone]  Is this the correct pharmacy for this prescription? Yes If no, delete pharmacy and type the correct one.   Has the prescription been filled recently? No  Is the patient out of the medication? Yes  Has the patient been seen for an appointment in the last year OR does the patient have an upcoming appointment? Yes  Can we respond through MyChart? Yes  Agent: Please be advised that Rx refills may take up to 3 business days. We ask that you follow-up with your pharmacy.

## 2024-10-03 ENCOUNTER — Ambulatory Visit: Admitting: Nurse Practitioner
